# Patient Record
Sex: Female | Born: 1955 | Race: Black or African American | Hispanic: No | Smoking: Never smoker
Health system: Southern US, Community
[De-identification: ages and names within clinical notes are randomized; demographics above are authoritative.]

## PROBLEM LIST (undated history)

## (undated) DIAGNOSIS — I10 Essential (primary) hypertension: Secondary | ICD-10-CM

## (undated) DIAGNOSIS — M1990 Unspecified osteoarthritis, unspecified site: Secondary | ICD-10-CM

## (undated) DIAGNOSIS — K219 Gastro-esophageal reflux disease without esophagitis: Secondary | ICD-10-CM

## (undated) DIAGNOSIS — Z923 Personal history of irradiation: Secondary | ICD-10-CM

## (undated) DIAGNOSIS — D649 Anemia, unspecified: Secondary | ICD-10-CM

## (undated) DIAGNOSIS — C50912 Malignant neoplasm of unspecified site of left female breast: Secondary | ICD-10-CM

## (undated) DIAGNOSIS — E785 Hyperlipidemia, unspecified: Secondary | ICD-10-CM

## (undated) DIAGNOSIS — Z87442 Personal history of urinary calculi: Secondary | ICD-10-CM

## (undated) HISTORY — DX: Gastro-esophageal reflux disease without esophagitis: K21.9

## (undated) HISTORY — DX: Essential (primary) hypertension: I10

## (undated) HISTORY — DX: Hyperlipidemia, unspecified: E78.5

## (undated) HISTORY — DX: Unspecified osteoarthritis, unspecified site: M19.90

---

## 1985-05-14 HISTORY — PX: ABDOMINAL HYSTERECTOMY: SHX81

## 1999-05-02 ENCOUNTER — Ambulatory Visit (HOSPITAL_COMMUNITY): Admission: RE | Admit: 1999-05-02 | Discharge: 1999-05-02 | Payer: Self-pay | Admitting: Family Medicine

## 1999-05-02 ENCOUNTER — Encounter: Payer: Self-pay | Admitting: Family Medicine

## 1999-06-14 ENCOUNTER — Ambulatory Visit (HOSPITAL_COMMUNITY): Admission: RE | Admit: 1999-06-14 | Discharge: 1999-06-14 | Payer: Self-pay | Admitting: Family Medicine

## 1999-06-14 ENCOUNTER — Encounter: Payer: Self-pay | Admitting: Family Medicine

## 1999-07-19 ENCOUNTER — Encounter: Admission: RE | Admit: 1999-07-19 | Discharge: 1999-07-19 | Payer: Self-pay | Admitting: Family Medicine

## 1999-07-19 ENCOUNTER — Encounter: Payer: Self-pay | Admitting: Family Medicine

## 2001-09-22 ENCOUNTER — Other Ambulatory Visit: Admission: RE | Admit: 2001-09-22 | Discharge: 2001-09-22 | Payer: Self-pay | Admitting: Family Medicine

## 2002-10-20 ENCOUNTER — Other Ambulatory Visit: Admission: RE | Admit: 2002-10-20 | Discharge: 2002-10-20 | Payer: Self-pay | Admitting: Family Medicine

## 2002-10-22 ENCOUNTER — Encounter: Payer: Self-pay | Admitting: Family Medicine

## 2002-10-22 ENCOUNTER — Encounter: Admission: RE | Admit: 2002-10-22 | Discharge: 2002-10-22 | Payer: Self-pay | Admitting: Family Medicine

## 2005-05-28 ENCOUNTER — Other Ambulatory Visit (HOSPITAL_COMMUNITY): Admission: RE | Admit: 2005-05-28 | Discharge: 2005-08-26 | Payer: Self-pay | Admitting: Psychiatry

## 2005-05-29 ENCOUNTER — Ambulatory Visit: Payer: Self-pay | Admitting: Psychiatry

## 2010-06-08 ENCOUNTER — Emergency Department (HOSPITAL_COMMUNITY)
Admission: EM | Admit: 2010-06-08 | Discharge: 2010-06-09 | Payer: Self-pay | Source: Home / Self Care | Admitting: Emergency Medicine

## 2010-06-08 LAB — BASIC METABOLIC PANEL
CO2: 27 mEq/L (ref 19–32)
Calcium: 10.6 mg/dL — ABNORMAL HIGH (ref 8.4–10.5)
Chloride: 104 mEq/L (ref 96–112)
GFR calc Af Amer: 46 mL/min — ABNORMAL LOW (ref >60)
Potassium: 4.2 mEq/L (ref 3.5–5.1)
Sodium: 142 mEq/L (ref 135–145)

## 2010-06-08 LAB — URINALYSIS, ROUTINE W REFLEX MICROSCOPIC
Bilirubin Urine: NEGATIVE
Ketones, ur: NEGATIVE mg/dL
Leukocytes, UA: NEGATIVE
Nitrite: NEGATIVE
Protein, ur: NEGATIVE mg/dL
Specific Gravity, Urine: 1.012 (ref 1.005–1.030)
Urine Glucose, Fasting: NEGATIVE mg/dL
Urobilinogen, UA: 0.2 mg/dL (ref 0.0–1.0)
pH: 7.5 (ref 5.0–8.0)

## 2010-06-08 LAB — POCT I-STAT, CHEM 8
BUN: 16 mg/dL (ref 6–23)
Calcium, Ion: 1.24 mmol/L (ref 1.12–1.32)
Chloride: 107 mEq/L (ref 96–112)
Creatinine, Ser: 1.6 mg/dL — ABNORMAL HIGH (ref 0.4–1.2)
Glucose, Bld: 106 mg/dL — ABNORMAL HIGH (ref 70–99)
HCT: 42 % (ref 36.0–46.0)
Hemoglobin: 14.3 g/dL (ref 12.0–15.0)
Potassium: 4.1 mEq/L (ref 3.5–5.1)
Sodium: 140 mEq/L (ref 135–145)
TCO2: 28 mmol/L (ref 0–100)

## 2010-06-08 LAB — CBC
HCT: 40.5 % (ref 36.0–46.0)
Hemoglobin: 13.3 g/dL (ref 12.0–15.0)
RBC: 4.35 MIL/uL (ref 3.87–5.11)
WBC: 11 10*3/uL — ABNORMAL HIGH (ref 4.0–10.5)

## 2010-06-08 LAB — URINE MICROSCOPIC-ADD ON

## 2010-06-08 LAB — DIFFERENTIAL
Basophils Absolute: 0 10*3/uL (ref 0.0–0.1)
Basophils Relative: 0 % (ref 0–1)
Lymphocytes Relative: 9 % — ABNORMAL LOW (ref 12–46)
Neutro Abs: 9.6 10*3/uL — ABNORMAL HIGH (ref 1.7–7.7)
Neutrophils Relative %: 87 % — ABNORMAL HIGH (ref 43–77)

## 2013-01-21 ENCOUNTER — Encounter (HOSPITAL_COMMUNITY): Payer: Self-pay | Admitting: Family Medicine

## 2013-01-21 ENCOUNTER — Emergency Department (HOSPITAL_COMMUNITY)
Admission: EM | Admit: 2013-01-21 | Discharge: 2013-01-22 | Disposition: A | Discharge status: Home / Self Care | Payer: No Typology Code available for payment source | Attending: Emergency Medicine | Admitting: Emergency Medicine

## 2013-01-21 DIAGNOSIS — S6980XA Other specified injuries of unspecified wrist, hand and finger(s), initial encounter: Secondary | ICD-10-CM | POA: Insufficient documentation

## 2013-01-21 DIAGNOSIS — R071 Chest pain on breathing: Secondary | ICD-10-CM | POA: Insufficient documentation

## 2013-01-21 DIAGNOSIS — Y9389 Activity, other specified: Secondary | ICD-10-CM | POA: Insufficient documentation

## 2013-01-21 DIAGNOSIS — S6990XA Unspecified injury of unspecified wrist, hand and finger(s), initial encounter: Secondary | ICD-10-CM | POA: Insufficient documentation

## 2013-01-21 DIAGNOSIS — M79645 Pain in left finger(s): Secondary | ICD-10-CM

## 2013-01-21 DIAGNOSIS — M791 Myalgia, unspecified site: Secondary | ICD-10-CM

## 2013-01-21 DIAGNOSIS — Y92410 Unspecified street and highway as the place of occurrence of the external cause: Secondary | ICD-10-CM | POA: Insufficient documentation
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
## 2013-01-22 ENCOUNTER — Emergency Department (HOSPITAL_COMMUNITY): Payer: No Typology Code available for payment source

## 2013-01-22 MED ORDER — IBUPROFEN 800 MG PO TABS
800.0000 mg | ORAL_TABLET | Freq: Once | ORAL | Status: AC
  Administered 2013-01-22: 800 mg via ORAL
  Filled 2013-01-22: qty 1

## 2013-01-22 MED ORDER — NAPROXEN SODIUM 220 MG PO TABS: 220.0000 mg | ORAL_TABLET | Freq: Two times a day (BID) | ORAL | Status: DC
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
## 2013-01-22 NOTE — ED Provider Notes (Signed)
Medical screening examination/treatment/procedure(s) were performed by non-physician practitioner and as supervising physician I was immediately available for consultation/collaboration.  Anairis Knick M Floyd Wade, MD 01/22/13 2044 

## 2013-02-17 ENCOUNTER — Ambulatory Visit: Payer: No Typology Code available for payment source | Attending: Internal Medicine

## 2013-07-10 ENCOUNTER — Encounter (HOSPITAL_COMMUNITY): Payer: Self-pay | Admitting: Emergency Medicine

## 2013-07-10 ENCOUNTER — Emergency Department (HOSPITAL_COMMUNITY): Payer: No Typology Code available for payment source

## 2013-07-10 ENCOUNTER — Emergency Department (HOSPITAL_COMMUNITY)
Admission: EM | Admit: 2013-07-10 | Discharge: 2013-07-10 | Disposition: A | Discharge status: Home / Self Care | Payer: No Typology Code available for payment source | Attending: Emergency Medicine | Admitting: Emergency Medicine

## 2013-07-10 DIAGNOSIS — R000 Tachycardia, unspecified: Secondary | ICD-10-CM | POA: Insufficient documentation

## 2013-07-10 DIAGNOSIS — K625 Hemorrhage of anus and rectum: Secondary | ICD-10-CM

## 2013-07-10 DIAGNOSIS — I10 Essential (primary) hypertension: Secondary | ICD-10-CM

## 2013-07-10 DIAGNOSIS — Z79899 Other long term (current) drug therapy: Secondary | ICD-10-CM | POA: Insufficient documentation

## 2013-07-10 LAB — PRO B NATRIURETIC PEPTIDE: Pro B Natriuretic peptide (BNP): 1916 pg/mL — ABNORMAL HIGH (ref 0–125)

## 2013-07-10 LAB — CBC WITH DIFFERENTIAL/PLATELET
BASOS ABS: 0 10*3/uL (ref 0.0–0.1)
BASOS PCT: 0 % (ref 0–1)
EOS PCT: 2 % (ref 0–5)
Eosinophils Absolute: 0.1 10*3/uL (ref 0.0–0.7)
HCT: 41.1 % (ref 36.0–46.0)
Hemoglobin: 13.6 g/dL (ref 12.0–15.0)
LYMPHS PCT: 32 % (ref 12–46)
Lymphs Abs: 1.8 10*3/uL (ref 0.7–4.0)
MCH: 30.4 pg (ref 26.0–34.0)
MCHC: 33.1 g/dL (ref 30.0–36.0)
MCV: 91.9 fL (ref 78.0–100.0)
Monocytes Absolute: 0.3 10*3/uL (ref 0.1–1.0)
Monocytes Relative: 6 % (ref 3–12)
NEUTROS ABS: 3.6 10*3/uL (ref 1.7–7.7)
Neutrophils Relative %: 61 % (ref 43–77)
PLATELETS: 311 10*3/uL (ref 150–400)
RBC: 4.47 MIL/uL (ref 3.87–5.11)
RDW: 13.5 % (ref 11.5–15.5)
WBC: 5.8 10*3/uL (ref 4.0–10.5)

## 2013-07-10 LAB — COMPREHENSIVE METABOLIC PANEL
ALBUMIN: 3.7 g/dL (ref 3.5–5.2)
ALT: 8 U/L (ref 0–35)
AST: 15 U/L (ref 0–37)
Alkaline Phosphatase: 112 U/L (ref 39–117)
BUN: 12 mg/dL (ref 6–23)
CALCIUM: 10.4 mg/dL (ref 8.4–10.5)
CHLORIDE: 104 meq/L (ref 96–112)
CO2: 25 meq/L (ref 19–32)
CREATININE: 0.99 mg/dL (ref 0.50–1.10)
GFR calc Af Amer: 71 mL/min — ABNORMAL LOW (ref >90)
GFR, EST NON AFRICAN AMERICAN: 61 mL/min — AB (ref >90)
Glucose, Bld: 91 mg/dL (ref 70–99)
Potassium: 4.1 mEq/L (ref 3.7–5.3)
SODIUM: 143 meq/L (ref 137–147)
Total Bilirubin: 0.5 mg/dL (ref 0.3–1.2)
Total Protein: 8 g/dL (ref 6.0–8.3)

## 2013-07-10 LAB — TROPONIN I: Troponin I: <0.3 ng/mL (ref <0.30)

## 2013-07-10 LAB — POC OCCULT BLOOD, ED: Fecal Occult Bld: POSITIVE — AB

## 2013-07-10 LAB — PROTIME-INR
INR: 0.97 (ref 0.00–1.49)
Prothrombin Time: 12.7 seconds (ref 11.6–15.2)

## 2013-07-10 LAB — D-DIMER, QUANTITATIVE (NOT AT ARMC): D DIMER QUANT: 2.27 ug{FEU}/mL — AB (ref 0.00–0.48)

## 2013-07-10 LAB — HEMOGLOBIN AND HEMATOCRIT, BLOOD
HEMATOCRIT: 40 % (ref 36.0–46.0)
HEMOGLOBIN: 13 g/dL (ref 12.0–15.0)

## 2013-07-10 MED ORDER — SODIUM CHLORIDE 0.9 % IV BOLUS (SEPSIS)
1000.0000 mL | Freq: Once | INTRAVENOUS | Status: AC
  Administered 2013-07-10: 1000 mL via INTRAVENOUS

## 2013-07-10 MED ORDER — IOHEXOL 350 MG/ML SOLN
100.0000 mL | Freq: Once | INTRAVENOUS | Status: AC | PRN
  Administered 2013-07-10: 100 mL via INTRAVENOUS

## 2013-07-10 MED ORDER — IOHEXOL 300 MG/ML  SOLN
50.0000 mL | Freq: Once | INTRAMUSCULAR | Status: AC | PRN
  Administered 2013-07-10: 50 mL via ORAL

## 2013-07-10 MED ORDER — OMEPRAZOLE 20 MG PO CPDR: 20.0000 mg | DELAYED_RELEASE_CAPSULE | Freq: Every day | ORAL | Status: DC

## 2013-07-10 NOTE — Progress Notes (Signed)
[facility] Stacy, spoke with patient about [employer]. Patient [facility]. CL set patient up with a follow-up apt with pcp on 4/8 at 2:00 pm.

## 2013-07-10 NOTE — ED Notes (Signed)
Per pt, had blood in her stool this am-asymptomatic, states clots with first BM, slight blood with second BM

## 2013-07-10 NOTE — Discharge Instructions (Signed)
Bloody Stools Followup with primary Dr. and GI doctor regarding her rectal bleeding. You need treatment for your blood pressure. Return to ED if you develop worsening bleeding, chest pain, abdominal pain, dizziness lightheadedness or any concerns. Bloody stools often mean that there is a problem in the digestive tract. Your caregiver may use the term "melena" to describe black, tarry, and bad smelling stools or "hematochezia" to describe red or maroon-colored stools. Blood seen in the stool can be caused by bleeding anywhere along the intestinal tract.  A black stool usually means that blood is coming from the upper part of the gastrointestinal tract (esophagus, stomach, or small bowel). Passing maroon-colored stools or bright red blood usually means that blood is coming from lower down in the large bowel or the rectum. However, sometimes massive bleeding in the stomach or small intestine can cause bright red bloody stools.  Consuming black licorice, lead, iron pills, medicines containing bismuth subsalicylate, or blueberries can also cause black stools. Your caregiver can test black stools to see if blood is present. It is important that the cause of the bleeding be found. Treatment can then be started, and the problem can be corrected. Rectal bleeding may not be serious, but you should not assume everything is okay until you know the cause.It is very important to follow up with your caregiver or a specialist in gastrointestinal problems. CAUSES  Blood in the stools can come from various underlying causes.Often, the cause is not found during your first visit. Testing is often needed to discover the cause of bleeding in the gastrointestinal tract. Causes range from simple to serious or even life-threatening.Possible causes include:  Hemorrhoids.These are veins that are full of blood (engorged) in the rectum. They cause pain, inflammation, and may bleed.  Anal fissures.These are areas of painful  tearing which may bleed. They are often caused by passing hard stool.  Diverticulosis.These are pouches that form on the colon over time, with age, and may bleed significantly.  Diverticulitis.This is inflammation in areas with diverticulosis. It can cause pain, fever, and bloody stools, although bleeding is rare.  Proctitis and colitis. These are inflamed areas of the rectum or colon. They may cause pain, fever, and bloody stools.  Polyps and cancer. Colon cancer is a leading cause of preventable cancer death.It often starts out as precancerous polyps that can be removed during a colonoscopy, preventing progression into cancer. Sometimes, polyps and cancer may cause rectal bleeding.  Gastritis and ulcers.Bleeding from the upper gastrointestinal tract (near the stomach) may travel through the intestines and produce black, sometimes tarry, often bad smelling stools. In certain cases, if the bleeding is fast enough, the stools may not be black, but red and the condition may be life-threatening. SYMPTOMS  You may have stools that are bright red and bloody, that are normal color with blood on them, or that are dark black and tarry. In some cases, you may only have blood in the toilet bowl. Any of these cases need medical care. You may also have:  Pain at the anus or anywhere in the rectum.  Lightheadedness or feeling faint.  Extreme weakness.  Nausea or vomiting.  Fever. DIAGNOSIS Your caregiver may use the following methods to find the cause of your bleeding:  Taking a medical history. Age is important. Older people tend to develop polyps and cancer more often. If there is anal pain and a hard, large stool associated with bleeding, a tear of the anus may be the cause. If blood drips  into the toilet after a bowel movement, bleeding hemorrhoids may be the problem. The color and frequency of the bleeding are additional considerations. In most cases, the medical history provides clues, but  seldom the final answer.  A visual and finger (digital) exam. Your caregiver will inspect the anal area, looking for tears and hemorrhoids. A finger exam can provide information when there is tenderness or a growth inside. In men, the prostate is also examined.  Endoscopy. Several types of small, long scopes (endoscopes) are used to view the colon.  In the office, your caregiver may use a rigid, or more commonly, a flexible viewing sigmoidoscope. This exam is called flexible sigmoidoscopy. It is performed in 5 to 10 minutes.  A more thorough exam is accomplished with a colonoscope. It allows your caregiver to view the entire 5 to 6 foot long colon. Medicine to help you relax (sedative) is usually given for this exam. Frequently, a bleeding lesion may be present beyond the reach of the sigmoidoscope. So, a colonoscopy may be the best exam to start with. Both exams are usually done on an outpatient basis. This means the patient does not stay overnight in the hospital or surgery center.  An upper endoscopy may be needed to examine your stomach. Sedation is used and a flexible endoscope is put in your mouth, down to your stomach.  A barium enema X-ray. This is an X-ray exam. It uses liquid barium inserted by enema into the rectum. This test alone may not identify an actual bleeding point. X-rays highlight abnormal shadows, such as those made by lumps (tumors), diverticuli, or colitis. TREATMENT  Treatment depends on the cause of your bleeding.   For bleeding from the stomach or colon, the caregiver doing your endoscopy or colonoscopy may be able to stop the bleeding as part of the procedure.  Inflammation or infection of the colon can be treated with medicines.  Many rectal problems can be treated with creams, suppositories, or warm baths.  Surgery is sometimes needed.  Blood transfusions are sometimes needed if you have lost a lot of blood.  For any bleeding problem, let your caregiver know if  you take aspirin or other blood thinners regularly. HOME CARE INSTRUCTIONS   Take any medicines exactly as prescribed.  Keep your stools soft by eating a diet high in fiber. Prunes (1 to 3 a day) work well for many people.  Drink enough water and fluids to keep your urine clear or pale yellow.  Take sitz baths if advised. A sitz bath is when you sit in a bathtub with warm water for 10 to 15 minutes to soak, soothe, and cleanse the rectal area.  If enemas or suppositories are advised, be sure you know how to use them. Tell your caregiver if you have problems with this.  Monitor your bowel movements to look for signs of improvement or worsening. SEEK MEDICAL CARE IF:   You do not improve in the time expected.  Your condition worsens after initial improvement.  You develop any new symptoms. SEEK IMMEDIATE MEDICAL CARE IF:   You develop severe or prolonged rectal bleeding.  You vomit blood.  You feel weak or faint.  You have a fever. MAKE SURE YOU:  Understand these instructions.  Will watch your condition.  Will get help right away if you are not doing well or get worse. Document Released: 04/20/2002 Document Revised: 07/23/2011 Document Reviewed: 09/15/2010 [facility] Patient Information 2014 [place], [place].   Hypertension As your heart beats,  it forces blood through your arteries. This force is your blood pressure. If the pressure is too high, it is called hypertension (HTN) or high blood pressure. HTN is dangerous because you may have it and not know it. High blood pressure may mean that your heart has to work harder to pump blood. Your arteries may be narrow or stiff. The extra work puts you at risk for heart disease, stroke, and other problems.  Blood pressure consists of two numbers, a higher number over a lower, 110/72, for example. It is stated as "110 over 72." The ideal is below 120 for the top number (systolic) and under 80 for the bottom (diastolic). Write down your  blood pressure today. You should pay close attention to your blood pressure if you have certain conditions such as:  Heart failure.  Prior heart attack.  Diabetes  Chronic kidney disease.  Prior stroke.  Multiple risk factors for heart disease. To see if you have HTN, your blood pressure should be measured while you are seated with your arm held at the level of the heart. It should be measured at least twice. A one-time elevated blood pressure reading (especially in the Emergency Department) does not mean that you need treatment. There may be conditions in which the blood pressure is different between your right and left arms. It is important to see your caregiver soon for a recheck. Most people have essential hypertension which means that there is not a specific cause. This type of high blood pressure may be lowered by changing lifestyle factors such as:  Stress.  Smoking.  Lack of exercise.  Excessive weight.  Drug/tobacco/alcohol use.  Eating less salt. Most people do not have symptoms from high blood pressure until it has caused damage to the body. Effective treatment can often prevent, delay or reduce that damage. TREATMENT  When a cause has been identified, treatment for high blood pressure is directed at the cause. There are a large number of medications to treat HTN. These fall into several categories, and your caregiver will help you select the medicines that are best for you. Medications may have side effects. You should review side effects with your caregiver. If your blood pressure stays high after you have made lifestyle changes or started on medicines,   Your medication(s) may need to be changed.  Other problems may need to be addressed.  Be certain you understand your prescriptions, and know how and when to take your medicine.  Be sure to follow up with your caregiver within the time frame advised (usually within two weeks) to have your blood pressure rechecked  and to review your medications.  If you are taking more than one medicine to lower your blood pressure, make sure you know how and at what times they should be taken. Taking two medicines at the same time can result in blood pressure that is too low. SEEK IMMEDIATE MEDICAL CARE IF:  You develop a severe headache, blurred or changing vision, or confusion.  You have unusual weakness or numbness, or a faint feeling.  You have severe chest or abdominal pain, vomiting, or breathing problems. MAKE SURE YOU:   Understand these instructions.  Will watch your condition.  Will get help right away if you are not doing well or get worse. Document Released: 04/30/2005 Document Revised: 07/23/2011 Document Reviewed: 12/19/2007 [facility] Patient Information 2014 [place].

## 2013-07-10 NOTE — ED Notes (Signed)
Dr. Manus Gunningancour approved that patient was allowed to eat so pt. was given a sandwich and ginger ale.

## 2013-07-10 NOTE — ED Notes (Signed)
Patient transported to CT 

## 2013-07-10 NOTE — ED Provider Notes (Signed)
CSN: [id number]     Arrival date & time 07/10/13  4098 History   First MD Initiated Contact with Patient 07/10/13 0815     Chief Complaint  Patient presents with  . Rectal Bleeding     (Consider location/radiation/quality/duration/timing/severity/associated sxs/prior Treatment) HPI Comments: Patient presents with 2 episodes of rectal bleeding this morning. She states she had a normal appearing bowel movement and noticed it was red color on the outside of brown stool. She had red with wiping. She denies straining on the toilet. She believes she saw some clots in the toilet water but the water did not turn red. She had no pain with defecation. She had no abdominal pain. No vomiting. She has second episode of brown stool with red on the outside I was less intense. She denies any previous history of rectal bleeding. She denies any hemorrhoid history. She admits to occasional naproxen and caffeine use.  She denies any excessive alcohol use and she does not smoke. She's never had a colonoscopy. Denies any dizziness or lightheadedness. She denies any chest pain or SOB.   The history is provided by the patient.    History reviewed. No pertinent past medical history. Past Surgical History  Procedure Laterality Date  . Abdominal hysterectomy     No family history on file. History  Substance Use Topics  . Smoking status: Never Smoker   . Smokeless tobacco: Not on file  . Alcohol Use: Not on file   OB History   Grav Para Term Preterm Abortions TAB SAB Ect Mult Living                 Review of Systems  Constitutional: Negative for fever, activity change and appetite change.  Respiratory: Negative for cough, chest tightness and shortness of breath.   Cardiovascular: Negative for chest pain and palpitations.  Gastrointestinal: Positive for blood in stool and hematochezia. Negative for nausea, vomiting, abdominal pain and rectal pain.  Genitourinary: Negative for dysuria, hematuria, vaginal  bleeding and vaginal discharge.  Musculoskeletal: Negative for back pain.  Skin: Negative for rash.  Neurological: Negative for dizziness, weakness, light-headedness and headaches.  A complete 10 system review of systems was obtained and all systems are negative except as noted in the HPI and PMH.      Allergies  Review of patient's allergies indicates no known allergies.  Home Medications   Current Outpatient Rx  Name  Route  Sig  Dispense  Refill  . Cholecalciferol (VITAMIN D-3 PO)   Oral   Take 1 capsule by mouth daily.         Marland Kitchen co-enzyme Q-10 30 MG capsule   Oral   Take 30 mg by mouth daily.         . Omega-3 Fatty Acids (FISH OIL CONCENTRATE PO)   Oral   Take 1 capsule by mouth daily.         Marland Kitchen omeprazole (PRILOSEC) 20 MG capsule   Oral   Take 1 capsule (20 mg total) by mouth daily.   30 capsule   0    BP 167/115  Pulse 95  Temp(Src) 98.2 F (36.8 C) (Oral)  Resp 18  SpO2 95% Physical Exam  Constitutional: She is oriented to person, place, and time. She appears well-developed and well-nourished. No distress.  HENT:  Head: Normocephalic and atraumatic.  Mouth/Throat: Oropharynx is clear and moist. No oropharyngeal exudate.  Eyes: Conjunctivae and EOM are normal. Pupils are equal, round, and reactive to light.  Neck:  Normal range of motion. Neck supple.  Cardiovascular: Normal rate and normal heart sounds.   tachycardic  Pulmonary/Chest: Effort normal and breath sounds normal. No respiratory distress.  Abdominal: Soft. There is no tenderness. There is no rebound and no guarding.  Genitourinary: Guaiac positive stool.  No visible hemorrhoids, no fissures. Brown stool with specks of red on exam Chaperone present  Musculoskeletal: Normal range of motion. She exhibits no edema and no tenderness.  Neurological: She is alert and oriented to person, place, and time. No cranial nerve deficit. She exhibits normal muscle tone. Coordination normal.  Skin: Skin  is warm.    ED Course  Procedures (including critical care time) Labs Review Labs Reviewed  COMPREHENSIVE METABOLIC PANEL - Abnormal; Notable for the following:    GFR calc non Af Amer 61 (*)    GFR calc Af Amer 71 (*)    All other components within normal limits  D-DIMER, QUANTITATIVE - Abnormal; Notable for the following:    D-Dimer, Quant 2.27 (*)    All other components within normal limits  POC OCCULT BLOOD, ED - Abnormal; Notable for the following:    Fecal Occult Bld POSITIVE (*)    All other components within normal limits  CBC WITH DIFFERENTIAL  PROTIME-INR  TROPONIN I  HEMOGLOBIN AND HEMATOCRIT, BLOOD  TROPONIN I   Imaging Review Ct Angio Chest Pe W/cm &/or Wo Cm  07/10/2013   CLINICAL DATA:  58 year old female with tachycardia and abnormal D-dimer. Bleeding. Initial encounter.  EXAM: CT ANGIOGRAPHY CHEST WITH CONTRAST  TECHNIQUE: Multidetector CT imaging of the chest was performed using the standard protocol during bolus administration of intravenous contrast. Multiplanar CT image reconstructions and MIPs were obtained to evaluate the vascular anatomy.  CONTRAST:  100mL OMNIPAQUE IOHEXOL 350 MG/ML SOLN  COMPARISON:  Chest radiographs 01/22/2013. CT Abdomen and Pelvis from today reported separately.  FINDINGS: Good contrast bolus timing in the pulmonary arterial tree.  No focal filling defect identified in the pulmonary arterial tree to suggest the presence of acute pulmonary embolism.  Major airways are patent. Trace layering right pleural effusion. No pericardial effusion. Pulmonary septal thickening. Dependent ground-glass opacity. No consolidation. Peribronchial thickening. Small nonspecific focus of sub solid opacity in the right middle lobe measuring 11 mm.  Aortic and coronary artery calcified atherosclerosis. No contrast within the aorta at the time of this exam. No mediastinal lymphadenopathy. Negative thoracic inlet. No axillary lymphadenopathy.  Abdomen findings reported  separately.  Scoliosis. No acute osseous abnormality identified.  Review of the MIP images confirms the above findings.  IMPRESSION: 1.  No evidence of acute pulmonary embolus. 2. Trace right pleural effusion and pulmonary septal thickening with mostly dependent ground-glass opacity. Favor interstitial edema rather than infectious/inflammatory process. 3. Abdomen and pelvis CT from today reported separately.   Electronically Signed   By: Augusto GambleLee  Hall M.D.   On: 07/10/2013 12:50   Ct Abdomen Pelvis W Contrast  07/10/2013   CLINICAL DATA:  Rectal bleeding  EXAM: CT ABDOMEN AND PELVIS WITH CONTRAST  TECHNIQUE: Multidetector CT imaging of the abdomen and pelvis was performed using the standard protocol following bolus administration of intravenous contrast.  CONTRAST:  100mL OMNIPAQUE IOHEXOL 350 MG/ML SOLN  COMPARISON:  06/09/2010  FINDINGS: Small right pleural effusion is identified. Diffuse interstitial prominence is identified in both lung bases compatible with interstitial edema.  There is no focal liver abnormality identified. The gallbladder appears normal. No biliary dilatation. Normal appearance of the pancreas. The spleen is unremarkable.  The  adrenal glands are both normal. Bilateral renal cysts are noted.  The urinary bladder appears normal.  Previous hysterectomy.  There is calcified atherosclerotic disease involving the abdominal aorta. No aneurysm. No upper abdominal adenopathy. No inguinal adenopathy identified. No pelvic or inguinal adenopathy identified.  The stomach appears within normal limits. The small bowel loops have a normal course and caliber without obstruction. The appendix is visualized and appears normal. Multiple distal colonic diverticula identified without acute inflammation.  Review of the visualized bony structures There is a a scoliosis deformity with multi level degenerative disc disease.  IMPRESSION: 1. No acute findings identified within the abdomen or pelvis. 2. Atherosclerotic  disease. 3. Scoliosis and degenerative disc disease 4. Colonic diverticulosis   Electronically Signed   By: Signa Kell M.D.   On: 07/10/2013 12:50    @NOMUSE @  MDM   Final diagnoses:  Rectal bleeding  Hypertension   2 episodes of bright red blood per rectum with stool. No abdominal pain or rectal pain. No dizziness or lightheadedness. She appears stable but is tachycardic. No chest pain or shortness of breath.  Patient is hypertensive, she has a history of hypertension but is not take medications. She does not have a doctor. She is mildly tachycardic in the 100s. Hemoglobin is stable. Suspect hemorrhoidal versus diverticular bleed. Patient is in no distress. Her CT scan in 2012 showed diverticulosis.  Heart rate Has improved to 95 after IV fluids. Patient in no distress. She denies chest pain, abdominal pain, back pain.  No rectal bleeding in ED. Hemoglobin stable on repeat check.  Admission offered to patient and discussed with Dr. Vanessa Barbara.  She is asymptomatic at this time.  No evidence of PE. Hemoglobin is stable and heart rate is improved to 95 after IV fluids. Dr. Vanessa Barbara agrees the patient is stable for outpatient followup. She is referred to the [facility] and to GI. She is informed about her elevated blood pressure and need for treatment. She is instructed to return with worsening bleeding, pain, chest pain or shortness of breath or any other concerns.   Date: 07/10/2013  Rate: 103  Rhythm: sinus tachycardia  QRS Axis: normal  Intervals: normal  ST/T Wave abnormalities: nonspecific ST/T changes  Conduction Disutrbances:none  Narrative Interpretation: Lateral and inferior T wave inversions consistent with LVH  Old EKG Reviewed: none available  BP 167/115  Pulse 95  Temp(Src) 98.2 F (36.8 C) (Oral)  Resp 18  SpO2 95%    Glynn Octave, MD 07/10/13 1656

## 2013-07-13 ENCOUNTER — Encounter (HOSPITAL_COMMUNITY): Payer: Self-pay | Admitting: Emergency Medicine

## 2013-07-13 ENCOUNTER — Emergency Department (HOSPITAL_COMMUNITY)
Admission: EM | Admit: 2013-07-13 | Discharge: 2013-07-13 | Disposition: A | Discharge status: Home / Self Care | Payer: No Typology Code available for payment source | Source: Home / Self Care | Attending: Family Medicine | Admitting: Family Medicine

## 2013-07-13 DIAGNOSIS — I10 Essential (primary) hypertension: Secondary | ICD-10-CM

## 2013-07-13 MED ORDER — LISINOPRIL-HYDROCHLOROTHIAZIDE 20-12.5 MG PO TABS: 1.0000 | ORAL_TABLET | Freq: Every day | ORAL | Status: DC

## 2013-07-13 NOTE — ED Notes (Signed)
[facility], and [facility] due to her heart rate and elevated BP, but they cannot see her until a month from now

## 2013-07-13 NOTE — ED Provider Notes (Signed)
CSN: [id number]     Arrival date & time 07/13/13  1911 History   First MD Initiated Contact with Patient 07/13/13 2039     Chief Complaint  Patient presents with  . Follow-up   (Consider location/radiation/quality/duration/timing/severity/associated sxs/prior Treatment) Patient is a 58 y.o. female presenting with hypertension.  Hypertension This is a chronic problem. Episode onset: no meds for long time. The problem has not changed since onset.Pertinent negatives include no chest pain, no headaches and no shortness of breath.    History reviewed. No pertinent past medical history. Past Surgical History  Procedure Laterality Date  . Abdominal hysterectomy     History reviewed. No pertinent family history. History  Substance Use Topics  . Smoking status: Never Smoker   . Smokeless tobacco: Not on file  . Alcohol Use: Not on file   OB History   Grav Para Term Preterm Abortions TAB SAB Ect Mult Living                 Review of Systems  Constitutional: Negative.   Respiratory: Negative.  Negative for shortness of breath.   Cardiovascular: Negative.  Negative for chest pain, palpitations and leg swelling.  Neurological: Negative for headaches.    Allergies  Review of patient's allergies indicates no known allergies.  Home Medications   Current Outpatient Rx  Name  Route  Sig  Dispense  Refill  . Cholecalciferol (VITAMIN D-3 PO)   Oral   Take 1 capsule by mouth daily.         Marland Kitchen. co-enzyme Q-10 30 MG capsule   Oral   Take 30 mg by mouth daily.         Marland Kitchen. lisinopril-hydrochlorothiazide (PRINZIDE,ZESTORETIC) 20-12.5 MG per tablet   Oral   Take 1 tablet by mouth daily.   30 tablet   1   . Omega-3 Fatty Acids (FISH OIL CONCENTRATE PO)   Oral   Take 1 capsule by mouth daily.         Marland Kitchen. omeprazole (PRILOSEC) 20 MG capsule   Oral   Take 1 capsule (20 mg total) by mouth daily.   30 capsule   0    BP 175/104  Pulse 106  Temp(Src) 98.8 F (37.1 C) (Oral)  Resp  16  SpO2 100% Physical Exam  Nursing note and vitals reviewed. Constitutional: She is oriented to person, place, and time. She appears well-developed and well-nourished.  Neck: Normal range of motion. Neck supple.  Cardiovascular: Normal rate, regular rhythm, normal heart sounds and intact distal pulses.   Pulmonary/Chest: Effort normal and breath sounds normal.  Lymphadenopathy:    She has no cervical adenopathy.  Neurological: She is alert and oriented to person, place, and time.  Skin: Skin is warm and dry.    ED Course  Procedures (including critical care time) Labs Review Labs Reviewed - No data to display Imaging Review No results found.   MDM   1. Hypertension goal BP (blood pressure) < 140/90        Linna HoffJames D Kindl, MD 07/13/13 2110

## 2013-07-17 ENCOUNTER — Ambulatory Visit: Payer: No Typology Code available for payment source | Attending: Internal Medicine | Admitting: Internal Medicine

## 2013-07-17 ENCOUNTER — Encounter: Payer: Self-pay | Admitting: Internal Medicine

## 2013-07-17 VITALS — BP 178/93 | HR 90 | Temp 98.2°F | Resp 18 | Ht 64.0 in | Wt 197.0 lb

## 2013-07-17 DIAGNOSIS — K921 Melena: Secondary | ICD-10-CM

## 2013-07-17 DIAGNOSIS — I10 Essential (primary) hypertension: Secondary | ICD-10-CM

## 2013-07-17 DIAGNOSIS — Z0000 Encounter for general adult medical examination without abnormal findings: Secondary | ICD-10-CM

## 2013-07-17 LAB — CBC
HCT: 40.4 % (ref 36.0–46.0)
Hemoglobin: 13.7 g/dL (ref 12.0–15.0)
MCH: 30.4 pg (ref 26.0–34.0)
MCHC: 33.9 g/dL (ref 30.0–36.0)
MCV: 89.8 fL (ref 78.0–100.0)
PLATELETS: 383 10*3/uL (ref 150–400)
RBC: 4.5 MIL/uL (ref 3.87–5.11)
RDW: 14.6 % (ref 11.5–15.5)
WBC: 6.1 10*3/uL (ref 4.0–10.5)

## 2013-07-17 MED ORDER — AMLODIPINE BESYLATE 10 MG PO TABS: 10.0000 mg | ORAL_TABLET | Freq: Every day | ORAL | Status: DC

## 2013-07-17 NOTE — Progress Notes (Unsigned)
Patient ID: Kayla Lowe, female   DOB: 09/02/1955, 58 y.o.   MRN: [id number]   HPI: Kayla Lowe is a 58 y.o. female presenting on 07/17/2013 with HTN and bloody stools. Pt was seen in the ER and had a CT which was normal. Hgb was 13. She is no longer having bleeding. Some of it was clots and at one point it was streaking on the stool and present when wiping.     Past Medical History  Diagnosis Date  . Hypertension     Past Surgical History  Procedure Laterality Date  . Abdominal hysterectomy      Current Outpatient Prescriptions  Medication Sig Dispense Refill  . Cholecalciferol (VITAMIN D-3 PO) Take 1 capsule by mouth daily.      Marland Kitchen co-enzyme Q-10 30 MG capsule Take 30 mg by mouth daily.      Marland Kitchen lisinopril-hydrochlorothiazide (PRINZIDE,ZESTORETIC) 20-12.5 MG per tablet Take 1 tablet by mouth daily.  30 tablet  1  . Omega-3 Fatty Acids (FISH OIL CONCENTRATE PO) Take 1 capsule by mouth daily.       No current facility-administered medications for this visit.    No Known Allergies  family history- father - lung cancer- brother with HTN  History   Social History  . Marital Status: Single    Spouse Name: N/A    Number of Children: N/A  . Years of Education: N/A   Occupational History  . Not on file.   Social History Main Topics  . Smoking status: Never Smoker   . Smokeless tobacco: Not on file  . Alcohol Use: Not on file  . Drug Use: No  . Sexual Activity: Not Currently   Other Topics Concern  . Not on file   Social History Narrative  . No narrative on file    Review of Systems  Review of Systems  Constitutional: Negative for fever, chills, diaphoresis, activity change, appetite change and fatigue.  HENT: Negative for ear pain, nosebleeds, congestion, facial swelling, rhinorrhea, neck pain, neck stiffness and ear discharge.  Eyes: Negative for pain, discharge, redness, itching and visual disturbance.  Respiratory: Negative for  cough, choking, chest tightness, shortness of breath, wheezing and stridor.  Cardiovascular: Negative for chest pain, palpitations and leg swelling.  Gastrointestinal: Negative for abdominal distention, vomiting, diarrhea or consitpation- Per HPI Genitourinary: Negative for dysuria, urgency, frequency, hematuria, flank pain, decreased urine volume, difficulty urinating and dyspareunia.  Musculoskeletal: Negative for back pain, joint swelling, arthralgias or gait problem.  Neurological: Negative for dizziness, tremors, seizures, syncope, facial asymmetry, speech difficulty, weakness, light-headedness, numbness and headaches.  Hematological: Negative for adenopathy. Does not bruise/bleed easily.  Psychiatric/Behavioral: Negative for hallucinations, behavioral problems, confusion, dysphoric mood   Objective:  BP 178/93  Pulse 90  Temp(Src) 98.2 F (36.8 C) (Oral)  Resp 18  Ht 5\' 4"  (1.626 m)  Wt 197 lb (89.359 kg)  BMI 33.80 kg/m2  SpO2 98% Filed Weights   07/17/13 1706  Weight: 197 lb (89.359 kg)     Physical Exam  Constitutional: Appears well-developed and well-nourished. No distress. HENT: Normocephalic. External right and left ear normal. Oropharynx is clear and moist.  Eyes: Conjunctivae and EOM are normal. PERRLA, no scleral icterus.  Neck: Normal ROM. Neck supple. No JVD. No tracheal deviation. No thyromegaly.  CVS: RRR, S1/S2 +, no murmurs, no gallops, no carotid bruit.  Pulmonary: Effort and breath sounds normal, no stridor, rhonchi, wheezes, rales.  Abdominal: Soft. BS +,  no distension, tenderness, rebound or guarding.  Musculoskeletal: Normal range of motion. No edema and no tenderness.  Neuro: Alert. Normal reflexes, muscle tone coordination. No cranial nerve deficit. Skin: Skin is warm and dry. No rash noted. Not diaphoretic. No erythema. No pallor.  Psychiatric: Normal mood and affect. Behavior, judgment, thought content normal.   Lab Results  Component Value Date    WBC 5.8 07/10/2013   HGB 13.0 07/10/2013   HCT 40.0 07/10/2013   MCV 91.9 07/10/2013   PLT 311 07/10/2013   Lab Results  Component Value Date   CREATININE 0.99 07/10/2013   BUN 12 07/10/2013   NA 143 07/10/2013   K 4.1 07/10/2013   CL 104 07/10/2013   CO2 25 07/10/2013    No results found for this basename: HGBA1C   Lipid Panel  No results found for this basename: chol,  trig,  hdl,  cholhdl,  vldl,  ldlcalc        There are no active problems to display for this patient.    Preventative Medicine:  No health maintenance topics applied.  No health maintenance topics applied. Mammogram/Pap Smear : referral to GYN Colonoscopy : referral to GI  LAB WORK: ordered Metabolic panel: CBC:  Vitamin D : TSH: PSA:    Assessment and plan: HTN (hypertension) -  - add Amlodipine  Bloody stool - Plan: Ambulatory referral to Gastroenterology,   Routine history and physical examination of adult - Plan: Ambulatory referral to Gastroenterology, Ambulatory referral to Obstetrics / Gynecology, CBC, COMPLETE METABOLIC PANEL WITH GFR, TSH, Vit D  25 hydroxy   Return in about 2 months (around 09/16/2013).   The patient was given clear instructions to go to ER or return to medical center if symptoms don't improve, worsen or new problems develop. The patient verbalized understanding. The patient was told to call to get lab results if they haven't heard anything in the next week.     Calvert CantorSaima Jack Bolio, MD

## 2013-07-17 NOTE — Progress Notes (Unsigned)
Pt here to establish care for hypertension last seen in urgent care Monday and prescribed Lisinopril-HCTZ 20-12.5 mg Denies h/a or cp at this time BP- 178/93

## 2013-07-18 LAB — TSH: TSH: 2.471 u[IU]/mL (ref 0.350–4.500)

## 2013-07-18 LAB — COMPLETE METABOLIC PANEL WITH GFR
ALK PHOS: 93 U/L (ref 39–117)
AST: 15 U/L (ref 0–37)
Albumin: 4.3 g/dL (ref 3.5–5.2)
BUN: 12 mg/dL (ref 6–23)
CO2: 27 mEq/L (ref 19–32)
CREATININE: 0.94 mg/dL (ref 0.50–1.10)
Calcium: 10.3 mg/dL (ref 8.4–10.5)
Chloride: 103 mEq/L (ref 96–112)
GFR, Est African American: 77 mL/min
GFR, Est Non African American: 67 mL/min
Glucose, Bld: 93 mg/dL (ref 70–99)
Potassium: 4.2 mEq/L (ref 3.5–5.3)
Sodium: 140 mEq/L (ref 135–145)
Total Bilirubin: 0.4 mg/dL (ref 0.2–1.2)
Total Protein: 7.5 g/dL (ref 6.0–8.3)

## 2013-07-18 LAB — VITAMIN D 25 HYDROXY (VIT D DEFICIENCY, FRACTURES): Vit D, 25-Hydroxy: 24 ng/mL — ABNORMAL LOW (ref 30–89)

## 2013-07-18 MED ORDER — VITAMIN D (ERGOCALCIFEROL) 1.25 MG (50000 UNIT) PO CAPS: 50000.0000 [IU] | ORAL_CAPSULE | ORAL | Status: DC

## 2013-07-18 NOTE — Progress Notes (Signed)
Quick Note:  Please let patient know that they are deficient in Vit D and prescribe 50,000 U Vit D weekly for 4 wks x 4 refills.  If they are unable to afford it, they can take Vit D2 OTC 2000 U daily. ______ 

## 2013-07-24 ENCOUNTER — Ambulatory Visit: Payer: No Typology Code available for payment source | Attending: Internal Medicine

## 2013-07-24 DIAGNOSIS — K921 Melena: Secondary | ICD-10-CM

## 2013-07-24 DIAGNOSIS — Z0000 Encounter for general adult medical examination without abnormal findings: Secondary | ICD-10-CM

## 2013-07-24 DIAGNOSIS — I10 Essential (primary) hypertension: Secondary | ICD-10-CM

## 2013-07-24 LAB — LIPID PANEL
CHOL/HDL RATIO: 5.3 ratio
Cholesterol: 303 mg/dL — ABNORMAL HIGH (ref 0–200)
HDL: 57 mg/dL (ref >39)
LDL Cholesterol: 221 mg/dL — ABNORMAL HIGH (ref 0–99)
Triglycerides: 126 mg/dL (ref <150)
VLDL: 25 mg/dL (ref 0–40)

## 2013-08-19 ENCOUNTER — Ambulatory Visit: Payer: No Typology Code available for payment source | Admitting: Internal Medicine

## 2013-08-24 ENCOUNTER — Encounter: Payer: Self-pay | Admitting: Internal Medicine

## 2013-08-30 ENCOUNTER — Emergency Department (HOSPITAL_COMMUNITY): Payer: No Typology Code available for payment source

## 2013-08-30 ENCOUNTER — Encounter (HOSPITAL_COMMUNITY): Payer: Self-pay | Admitting: Emergency Medicine

## 2013-08-30 ENCOUNTER — Emergency Department (HOSPITAL_COMMUNITY)
Admission: EM | Admit: 2013-08-30 | Discharge: 2013-08-30 | Disposition: A | Discharge status: Home / Self Care | Payer: No Typology Code available for payment source | Attending: Emergency Medicine | Admitting: Emergency Medicine

## 2013-08-30 DIAGNOSIS — Z79899 Other long term (current) drug therapy: Secondary | ICD-10-CM | POA: Insufficient documentation

## 2013-08-30 DIAGNOSIS — B9789 Other viral agents as the cause of diseases classified elsewhere: Secondary | ICD-10-CM | POA: Insufficient documentation

## 2013-08-30 DIAGNOSIS — R61 Generalized hyperhidrosis: Secondary | ICD-10-CM | POA: Insufficient documentation

## 2013-08-30 DIAGNOSIS — R05 Cough: Secondary | ICD-10-CM

## 2013-08-30 DIAGNOSIS — B349 Viral infection, unspecified: Secondary | ICD-10-CM

## 2013-08-30 DIAGNOSIS — I10 Essential (primary) hypertension: Secondary | ICD-10-CM | POA: Insufficient documentation

## 2013-08-30 DIAGNOSIS — R079 Chest pain, unspecified: Secondary | ICD-10-CM | POA: Insufficient documentation

## 2013-08-30 DIAGNOSIS — Z8701 Personal history of pneumonia (recurrent): Secondary | ICD-10-CM | POA: Insufficient documentation

## 2013-08-30 DIAGNOSIS — R059 Cough, unspecified: Secondary | ICD-10-CM

## 2013-08-30 MED ORDER — HYDROCOD POLST-CHLORPHEN POLST 10-8 MG/5ML PO LQCR: 5.0000 mL | Freq: Two times a day (BID) | ORAL | Status: DC | PRN

## 2013-08-30 NOTE — ED Notes (Signed)
Pt states she's had a productive cough/congestion x 2 weeks, states has been coughing up green mucus x 1 week, pt states from coughing she is having chest discomfort and has a hx of pna. Pt states has taken robitussin and Claritin-D at home w/ no relief.

## 2013-08-30 NOTE — ED Notes (Signed)
Pt c/o productive cough since yesterday

## 2013-08-30 NOTE — ED Notes (Signed)
Patient transported to X-ray 

## 2013-08-30 NOTE — Discharge Instructions (Signed)

## 2013-08-30 NOTE — ED Provider Notes (Signed)
CSN: [id number]     Arrival date & time 08/30/13  1305 History   This chart was scribed for non-physician practitioner Teressa LowerVrinda Khalfani Weideman, FNP,working with Junius ArgyleForrest S Harrison, MD, by Yevette EdwardsAngela Bracken, ED Scribe. This patient was seen in room WTR5/WTR5 and the patient's care was started at 1:15 PM.    Chief Complaint  Patient presents with  . Cough    The history is provided by the patient. No language interpreter was used.   HPI Comments: Kayla Lowe is a 58 y.o. female, with a h/o HTN, who presents to the Emergency Department complaining of cough productive of green phlegm which began yesterday. She has experienced chest tightness and episodes of diaphoresis. She has a h/o sick contacts and a h/o pneumonia. She denies a h/o asthma. The pt is a non-smoker.   Past Medical History  Diagnosis Date  . Hypertension    Past Surgical History  Procedure Laterality Date  . Abdominal hysterectomy     No family history on file. History  Substance Use Topics  . Smoking status: Never Smoker   . Smokeless tobacco: Not on file  . Alcohol Use: Not on file   No OB history provided.  Review of Systems  Constitutional: Positive for diaphoresis.  Respiratory: Positive for cough and chest tightness.   All other systems reviewed and are negative.  Allergies  Review of patient's allergies indicates no known allergies.  Home Medications   Prior to Admission medications   Medication Sig Start Date End Date Taking? Authorizing Provider  amLODipine (NORVASC) 10 MG tablet Take 1 tablet (10 mg total) by mouth daily. 07/17/13   Calvert CantorSaima Rizwan, MD  Cholecalciferol (VITAMIN D-3 PO) Take 1 capsule by mouth daily.    Historical Provider, MD  co-enzyme Q-10 30 MG capsule Take 30 mg by mouth daily.    Historical Provider, MD  lisinopril-hydrochlorothiazide (PRINZIDE,ZESTORETIC) 20-12.5 MG per tablet Take 1 tablet by mouth daily. 07/13/13   Linna HoffJames D Kindl, MD  Omega-3 Fatty Acids (FISH OIL CONCENTRATE  PO) Take 1 capsule by mouth daily.    Historical Provider, MD  omeprazole (PRILOSEC) 20 MG capsule Take 1 capsule (20 mg total) by mouth daily. 07/10/13   Glynn OctaveStephen Rancour, MD  Vitamin D, Ergocalciferol, (DRISDOL) 50000 UNITS CAPS capsule Take 1 capsule (50,000 Units total) by mouth every 7 (seven) days. 07/18/13   Calvert CantorSaima Rizwan, MD   Triage Vitals: BP 142/76  Pulse 104  Temp(Src) 98 F (36.7 C) (Oral)  Resp 20  Ht 5' 4.5" (1.638 m)  Wt 197 lb (89.359 kg)  BMI 33.31 kg/m2  SpO2 99%  Physical Exam  Nursing note and vitals reviewed. Constitutional: She is oriented to person, place, and time. She appears well-developed and well-nourished. No distress.  HENT:  Head: Normocephalic and atraumatic.  Right Ear: External ear normal.  Left Ear: External ear normal.  Erythematous oropharynx. Swollen nares.  Productive cough.   Eyes: EOM are normal.  Neck: Neck supple. No tracheal deviation present.  Cardiovascular: Normal rate.   Pulmonary/Chest: Effort normal. No respiratory distress.  Musculoskeletal: Normal range of motion.  Neurological: She is alert and oriented to person, place, and time.  Skin: Skin is warm and dry.  Psychiatric: She has a normal mood and affect. Her behavior is normal.    ED Course  Procedures (including critical care time)  DIAGNOSTIC STUDIES: Oxygen Saturation is 99% on room air, normal by my interpretation.    COORDINATION OF CARE:  1:18 PM- Discussed treatment plan with patient,  and the patient agreed to the plan. The plan includes a chest x-ray.   Labs Review Labs Reviewed - No data to display  Imaging Review Dg Chest 2 View  08/30/2013   CLINICAL DATA:  Shortness of breath with mid chest pain.  EXAM: CHEST  2 VIEW  COMPARISON:  None.  FINDINGS: Normal cardiac and mediastinal silhouette. Clear lung fields. No effusion or pneumothorax. No acute bony abnormality. Mild scoliosis is stable.  IMPRESSION: No active cardiopulmonary disease.   Electronically  Signed   By: Davonna BellingJohn  Curnes M.D.   On: 08/30/2013 14:20     EKG Interpretation None      MDM   Final diagnoses:  Cough  Viral illness   No infection noted. Will treat symptomatically  I personally performed the services described in this documentation, which was scribed in my presence. The recorded information has been reviewed and is accurate.      Teressa LowerVrinda Angellica Maddison, NP 08/30/13 1455

## 2013-08-30 NOTE — ED Provider Notes (Signed)
Medical screening examination/treatment/procedure(s) were performed by non-physician practitioner and as supervising physician I was immediately available for consultation/collaboration.   EKG Interpretation None        Junius ArgyleForrest S Jacorian Golaszewski, MD 08/30/13 1510

## 2013-09-08 ENCOUNTER — Telehealth: Payer: Self-pay | Admitting: *Deleted

## 2013-09-08 NOTE — Telephone Encounter (Signed)
Received voicemail from [employer]. Patient reporting she needs a refill on her Lisinopril until her next appointment. Spoke with patient and informed her that her appointment is 30Apr2015 at 4:45PM. Patient reports she has enough Lisinopril to last her until her appointment on 30Apr2015. Reather LaurenceJamie R Paxtyn Boyar, RN

## 2013-09-10 ENCOUNTER — Ambulatory Visit: Payer: No Typology Code available for payment source | Attending: Family Medicine | Admitting: Family Medicine

## 2013-09-10 ENCOUNTER — Encounter: Payer: Self-pay | Admitting: Family Medicine

## 2013-09-10 VITALS — BP 128/86 | HR 72 | Temp 98.3°F | Resp 17 | Wt 202.4 lb

## 2013-09-10 DIAGNOSIS — I10 Essential (primary) hypertension: Secondary | ICD-10-CM

## 2013-09-10 DIAGNOSIS — E785 Hyperlipidemia, unspecified: Secondary | ICD-10-CM

## 2013-09-10 MED ORDER — OMEPRAZOLE 20 MG PO CPDR: 20.0000 mg | DELAYED_RELEASE_CAPSULE | Freq: Every morning | ORAL | Status: DC

## 2013-09-10 MED ORDER — LISINOPRIL-HYDROCHLOROTHIAZIDE 20-12.5 MG PO TABS: 1.0000 | ORAL_TABLET | Freq: Every evening | ORAL | Status: DC

## 2013-09-10 MED ORDER — ATORVASTATIN CALCIUM 40 MG PO TABS: 40.0000 mg | ORAL_TABLET | Freq: Every day | ORAL | Status: DC

## 2013-09-10 NOTE — Assessment & Plan Note (Signed)
Well controlled continue current medications  

## 2013-09-10 NOTE — Patient Instructions (Addendum)
Thank you for coming in today.  Blood pressure is great!!!  Cholesterol labs were high will start you on Lipitor. You will take this 1 time a day and you may experience some muscle aches.  Refilled lisinopril and omeprazole.  Contact us or go to the ER if your vision gets worse/lose vision

## 2013-09-10 NOTE — Progress Notes (Signed)
   Subjective:    Patient ID: Kayla Lowe, female    DOB: Oct 15, 1955, 58 y.o.   MRN: [id number]  HPI HYPERTENSION Home BP readings: no Chest Pain: no Lightheadedness or Syncope: no Leg Swelling: no   Medications When took last medication:  today Misses taking medications:  no  Diet Ability to limit unhealthy foods:  yes  Exercise Frequency: no Type: N/A   Monitoring Labs and Parameters Last A1C:  No results found for this basename: HGBA1C    CHOLESTEROL Never taken medication before.  Thinks mom had high cholesterol.  Told she did in the past.  No current heart disease or chest pain   Last Lipid:     Component Value Date/Time   CHOL 303* 07/24/2013 0917   HDL 57 07/24/2013 0917    Last Bmet  Potassium  Date Value Ref Range Status  07/17/2013 4.2  3.5 - 5.3 mEq/L Final     Sodium  Date Value Ref Range Status  07/17/2013 140  135 - 145 mEq/L Final     Creat  Date Value Ref Range Status  07/17/2013 0.94  0.50 - 1.10 mg/dL Final     Creatinine, Ser  Date Value Ref Range Status  07/10/2013 0.99  0.50 - 1.10 mg/dL Final      Last BPs:  BP Readings from Last 3 Encounters:  09/10/13 128/86  08/30/13 142/76  07/17/13 178/93    Weight history:  Wt Readings from Last 3 Encounters:  09/10/13 202 lb 6.4 oz (91.808 kg)  08/30/13 197 lb (89.359 kg)  07/17/13 197 lb (89.359 kg)     Eye Flashes Had episode last week of light flashing in left eye.  Feels well now.    Review of Symptoms - see HPI PMH - Smoking status noted.      Review of Systems     Objective:   Physical Exam  Heart - Regular rate and rhythm.  No murmurs, gallops or rubs.    Lungs:  Normal respiratory effort, chest expands symmetrically. Lungs are clear to auscultation, no crackles or wheezes. Extremities:  No cyanosis, edema, or deformity noted with good range of motion of all major joints.   Eye - Pupils Equal Round Reactive to light, Extraocular movements intact,  Fundi without hemorrhage or visible lesions, Conjunctiva without redness or discharge        Assessment & Plan:

## 2013-09-10 NOTE — Assessment & Plan Note (Signed)
LDL 221.  Will start lipitor for primary prevention

## 2013-11-11 ENCOUNTER — Telehealth: Payer: Self-pay | Admitting: *Deleted

## 2013-11-11 NOTE — Telephone Encounter (Signed)
Patient called stating she will be needing a refill on her Amlodipine, Lisinopril-HCTZ, and Atorvastatin. Patient states she noticed she has been coughing and having muscle cramps. Informed patient not to take the Lisinopril-HCTZ. Patient scheduled for nurse visit for 11/12/2013 at 0930. Patient verbalized understanding. Annamaria Hellingose,Kariya Lavergne Renee, RN

## 2013-11-12 ENCOUNTER — Ambulatory Visit: Payer: No Typology Code available for payment source | Attending: Internal Medicine | Admitting: *Deleted

## 2013-11-12 VITALS — BP 130/70 | HR 89 | Temp 98.1°F | Resp 16

## 2013-11-12 DIAGNOSIS — K219 Gastro-esophageal reflux disease without esophagitis: Secondary | ICD-10-CM

## 2013-11-12 DIAGNOSIS — R252 Cramp and spasm: Secondary | ICD-10-CM

## 2013-11-12 DIAGNOSIS — I10 Essential (primary) hypertension: Secondary | ICD-10-CM

## 2013-11-12 DIAGNOSIS — E785 Hyperlipidemia, unspecified: Secondary | ICD-10-CM

## 2013-11-12 LAB — BASIC METABOLIC PANEL
BUN: 11 mg/dL (ref 6–23)
CALCIUM: 10.2 mg/dL (ref 8.4–10.5)
CO2: 29 meq/L (ref 19–32)
Chloride: 106 mEq/L (ref 96–112)
Creat: 0.88 mg/dL (ref 0.50–1.10)
GLUCOSE: 102 mg/dL — AB (ref 70–99)
Potassium: 4.1 mEq/L (ref 3.5–5.3)
Sodium: 142 mEq/L (ref 135–145)

## 2013-11-12 MED ORDER — OMEPRAZOLE 20 MG PO CPDR: 20.0000 mg | DELAYED_RELEASE_CAPSULE | Freq: Every morning | ORAL | Status: DC

## 2013-11-12 MED ORDER — ATORVASTATIN CALCIUM 40 MG PO TABS: 40.0000 mg | ORAL_TABLET | Freq: Every day | ORAL | Status: DC

## 2013-11-12 MED ORDER — LOSARTAN POTASSIUM 25 MG PO TABS: 25.0000 mg | ORAL_TABLET | Freq: Every day | ORAL | Status: DC

## 2013-11-12 NOTE — Patient Instructions (Signed)
Potassium Content of Foods  Potassium is a mineral found in many foods and drinks. It helps keep fluids and minerals balanced in your body and affects how steadily your heart beats. Potassium also helps control your blood pressure and keep your muscles and nervous system healthy.  Certain health conditions and medicines may change the balance of potassium in your body. When this happens, you can help balance your level of potassium through the foods that you do or do not eat. Your health care provider or dietitian may recommend an amount of potassium that you should have each day. The following lists of foods provide the amount of potassium (in parentheses) per serving in each item.  HIGH IN POTASSIUM   The following foods and beverages have 200 mg or more of potassium per serving:  · Apricots, 2 raw or 5 dry (200 mg).  · Artichoke, 1 medium (345 mg).  · Avocado, raw,  ¼ each (245 mg).  · Banana, 1 medium (425 mg).  · Beans, lima, or baked beans, canned, ½ cup (280 mg).  · Beans, white, canned, ½ cup (595 mg).  · Beef roast, 3 oz (320 mg).  · Beef, ground, 3 oz (270 mg).  · Beets, raw or cooked, ½ cup (260 mg).  · Bran muffin, 2 oz (300 mg).  · Broccoli, ½ cup (230 mg).  · Brussels sprouts, ½ cup (250 mg).  · Cantaloupe, ½ cup (215 mg).  · Cereal, 100% bran, ½ cup (200-400 mg).  · Cheeseburger, single, fast food, 1 each (225-400 mg).  · Chicken, 3 oz (220 mg).  · Clams, canned, 3 oz (535 mg).  · Crab, 3 oz (225 mg).  · Dates, 5 each (270 mg).  · Dried beans and peas, ½ cup (300-475 mg).  · Figs, dried, 2 each (260 mg).  · Fish: halibut, tuna, cod, snapper, 3 oz (480 mg).  · Fish: salmon, haddock, swordfish, perch, 3 oz (300 mg).  · Fish, tuna, canned 3 oz (200 mg).  · French fries, fast food, 3 oz (470 mg).  · Granola with fruit and nuts, ½ cup (200 mg).  · Grapefruit juice, ½ cup (200 mg).  · Greens, beet, ½ cup (655 mg).  · Honeydew melon, ½ cup (200 mg).  · Kale, raw, 1 cup (300 mg).  · Kiwi, 1 medium (240  mg).  · Kohlrabi, rutabaga, parsnips, ½ cup (280 mg).  · Lentils, ½ cup (365 mg).  · Mango, 1 each (325 mg).  · Milk, chocolate, 1 cup (420 mg).  · Milk: nonfat, low-fat, whole, buttermilk, 1 cup (350-380 mg).  · Molasses, 1 Tbsp (295 mg).  · Mushrooms, ½ cup (280) mg.  · Nectarine, 1 each (275 mg).  · Nuts: almonds, peanuts, hazelnuts, [place], cashew, mixed, 1 oz (200 mg).  · Nuts, pistachios, 1 oz (295 mg).  · Orange, 1 each (240 mg).  · Orange juice, ½ cup (235 mg).  · Papaya, medium, ½ fruit (390 mg).  · Peanut butter, chunky, 2 Tbsp (240 mg).  · Peanut butter, smooth, 2 Tbsp (210 mg).  · Pear, 1 medium (200 mg).  · Pomegranate, 1 whole (400 mg).  · Pomegranate juice, ½ cup (215 mg).  · Pork, 3 oz (350 mg).  · Potato chips, salted, 1 oz (465 mg).  · Potato, baked with skin, 1 medium (925 mg).  · Potatoes, boiled, ½ cup (255 mg).  · Potatoes, mashed, ½ cup (330 mg).  · Prune juice, ½ cup (  370 mg).  · Prunes, 5 each (305 mg).  · Pudding, chocolate, ½ cup (230 mg).  · Pumpkin, canned, ½ cup (250 mg).  · Raisins, seedless, ¼ cup (270 mg).  · Seeds, sunflower or pumpkin, 1 oz (240 mg).  · Soy milk, 1 cup (300 mg).  · Spinach, ½ cup (420 mg).  · Spinach, canned, ½ cup (370 mg).  · Sweet potato, baked with skin, 1 medium (450 mg).  · Swiss chard, ½ cup (480 mg).  · Tomato or vegetable juice, ½ cup (275 mg).  · Tomato sauce or puree, ½ cup (400-550 mg).  · Tomato, raw, 1 medium (290 mg).  · Tomatoes, canned, ½ cup (200-300 mg).  · Turkey, 3 oz (250 mg).  · Wheat germ, 1 oz (250 mg).  · Winter squash, ½ cup (250 mg).  · Yogurt, plain or fruited, 6 oz (260-435 mg).  · Zucchini, ½ cup (220 mg).  MODERATE IN POTASSIUM  The following foods and beverages have 50-200 mg of potassium per serving:  · Apple, 1 each (150 mg).  · Apple juice, ½ cup (150 mg).  · Applesauce, ½ cup (90 mg).  · Apricot nectar, ½ cup (140 mg).  · Asparagus, small spears, ½ cup or 6 spears (155 mg).  · Bagel, cinnamon raisin, 1 each (130 mg).  · Bagel,  egg or plain, 4 in., 1 each (70 mg).  · Beans, green, ½ cup (90 mg).  · Beans, yellow, ½ cup (190 mg).  · Beer, regular, 12 oz (100 mg).  · Beets, canned, ½ cup (125 mg).  · Blackberries, ½ cup (115 mg).  · Blueberries, ½ cup (60 mg).  · Bread, whole wheat, 1 slice (70 mg).  · Broccoli, raw, ½ cup (145 mg).  · Cabbage, ½ cup (150 mg).  · Carrots, cooked or raw, ½ cup (180 mg).  · Cauliflower, raw, ½ cup (150 mg).  · Celery, raw, ½ cup (155 mg).  · Cereal, bran flakes, ½cup (120-150 mg).  · Cheese, cottage, ½ cup (110 mg).  · Cherries, 10 each (150 mg).  · Chocolate, 1½ oz bar (165 mg).  · Coffee, brewed 6 oz (90 mg).  · Corn, ½ cup or 1 ear (195 mg).  · Cucumbers, ½ cup (80 mg).  · Egg, large, 1 each (60 mg).  · Eggplant, ½ cup (60 mg).  · Endive, raw, ½cup (80 mg).  · English muffin, 1 each (65 mg).  · Fish, orange roughy, 3 oz (150 mg).  · Frankfurter, beef or pork, 1 each (75 mg).  · Fruit cocktail, ½ cup (115 mg).  · Grape juice, ½ cup (170 mg).  · Grapefruit, ½ fruit (175 mg).  · Grapes, ½ cup (155 mg).  · Greens: kale, turnip, collard, ½ cup (110-150 mg).  · Ice cream or frozen yogurt, chocolate, ½ cup (175 mg).  · Ice cream or frozen yogurt, vanilla, ½ cup (120-150 mg).  · Lemons, limes, 1 each (80 mg).  · Lettuce, all types, 1 cup (100 mg).  · Mixed vegetables, ½ cup (150 mg).  · Mushrooms, raw, ½ cup (110 mg).  · Nuts: walnuts, pecans, or macadamia, 1 oz (125 mg).  · Oatmeal, ½ cup (80 mg).  · Okra, ½ cup (110 mg).  · Onions, raw, ½ cup (120 mg).  · Peach, 1 each (185 mg).  · Peaches, canned, ½ cup (120 mg).  · Pears, canned, ½ cup (120 mg).  · Peas, green,   frozen, ½ cup (90 mg).  · Peppers, green, ½ cup (130 mg).  · Peppers, red, ½ cup (160 mg).  · Pineapple juice, ½ cup (165 mg).  · Pineapple, fresh or canned, ½ cup (100 mg).  · Plums, 1 each (105 mg).  · Pudding, vanilla, ½ cup (150 mg).  · Raspberries, ½ cup (90 mg).  · Rhubarb, ½ cup (115 mg).  · Rice, wild, ½ cup (80 mg).  · Shrimp, 3 oz (155  mg).  · Spinach, raw, 1 cup (170 mg).  · Strawberries, ½ cup (125 mg).  · Summer squash ½ cup (175-200 mg).  · Swiss chard, raw, 1 cup (135 mg).  · Tangerines, 1 each (140 mg).  · Tea, brewed, 6 oz (65 mg).  · Turnips, ½ cup (140 mg).  · Watermelon, ½ cup (85 mg).  · Wine, red, table, 5 oz (180 mg).  · Wine, white, table, 5 oz (100 mg).  LOW IN POTASSIUM  The following foods and beverages have less than 50 mg of potassium per serving.  · Bread, white, 1 slice (30 mg).  · Carbonated beverages, 12 oz (less than 5 mg).  · Cheese, 1 oz (20-30 mg).  · Cranberries, ½ cup (45 mg).  · Cranberry juice cocktail, ½ cup (20 mg).  · Fats and oils, 1 Tbsp (less than 5 mg).  · Hummus, 1 Tbsp (32 mg).  · Nectar: papaya, mango, or pear, ½ cup (35 mg).  · Rice, white or brown, ½ cup (50 mg).  · Spaghetti or macaroni, ½ cup cooked (30 mg).  · Tortilla, flour or corn, 1 each (50 mg).  · Waffle, 4 in., 1 each (50 mg).  · Water chestnuts, ½ cup (40 mg).  Document Released: 12/12/2004 Document Revised: 05/05/2013 Document Reviewed: 03/27/2013  ExitCare® Patient Information ©2015 [employer]. This information is not intended to replace advice given to you by your health care provider. Make sure you discuss any questions you have with your health care provider.

## 2013-11-12 NOTE — Progress Notes (Signed)
Patient presents today for problems with medication. Patient states she takes Amlodipine, Atorvastatin, and Lisinopril-HCTZ at 10pm. Patient states at 11PM at night she continuously coughing, then two hours later she has muscle spasms. Patient needs refills on Atorvastatin and Omeprazole. Consulted with Dr. Orpah CobbAdvani who discontinued Lisinopril-HCTZ. Verbal order given for Losartan 25 mg once daily and BMP, verbal order to refill Atorvastatin and Omeprazole. Per Dr. Orpah CobbAdvani patient is to come back in 2 weeks for BP check and needs appt.

## 2013-11-20 ENCOUNTER — Encounter: Payer: Self-pay | Admitting: Internal Medicine

## 2013-11-20 ENCOUNTER — Ambulatory Visit: Payer: No Typology Code available for payment source | Attending: Internal Medicine | Admitting: Internal Medicine

## 2013-11-20 VITALS — BP 137/76 | HR 80 | Temp 98.4°F | Resp 16 | Ht 65.0 in | Wt 203.0 lb

## 2013-11-20 DIAGNOSIS — Z1211 Encounter for screening for malignant neoplasm of colon: Secondary | ICD-10-CM

## 2013-11-20 DIAGNOSIS — E785 Hyperlipidemia, unspecified: Secondary | ICD-10-CM | POA: Insufficient documentation

## 2013-11-20 DIAGNOSIS — Z139 Encounter for screening, unspecified: Secondary | ICD-10-CM

## 2013-11-20 DIAGNOSIS — J309 Allergic rhinitis, unspecified: Secondary | ICD-10-CM | POA: Insufficient documentation

## 2013-11-20 DIAGNOSIS — M5380 Other specified dorsopathies, site unspecified: Secondary | ICD-10-CM

## 2013-11-20 DIAGNOSIS — J3089 Other allergic rhinitis: Secondary | ICD-10-CM

## 2013-11-20 DIAGNOSIS — I10 Essential (primary) hypertension: Secondary | ICD-10-CM | POA: Insufficient documentation

## 2013-11-20 DIAGNOSIS — M62838 Other muscle spasm: Secondary | ICD-10-CM | POA: Insufficient documentation

## 2013-11-20 DIAGNOSIS — M62830 Muscle spasm of back: Secondary | ICD-10-CM

## 2013-11-20 DIAGNOSIS — Z79899 Other long term (current) drug therapy: Secondary | ICD-10-CM | POA: Insufficient documentation

## 2013-11-20 MED ORDER — CYCLOBENZAPRINE HCL 10 MG PO TABS: 10.0000 mg | ORAL_TABLET | Freq: Every day | ORAL | Status: DC

## 2013-11-20 MED ORDER — FLUTICASONE PROPIONATE 50 MCG/ACT NA SUSP: 2.0000 | Freq: Every day | NASAL | Status: DC
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
## 2013-11-20 NOTE — Patient Instructions (Signed)
DASH Eating Plan  DASH stands for "Dietary Approaches to Stop Hypertension." The DASH eating plan is a healthy eating plan that has been shown to reduce high blood pressure (hypertension). Additional health benefits may include reducing the risk of type 2 diabetes mellitus, heart disease, and stroke. The DASH eating plan may also help with weight loss.  WHAT DO I NEED TO KNOW ABOUT THE DASH EATING PLAN?  For the DASH eating plan, you will follow these general guidelines:  · Choose foods with a percent daily value for sodium of less than 5% (as listed on the food label).  · Use salt-free seasonings or herbs instead of table salt or sea salt.  · Check with your health care provider or pharmacist before using salt substitutes.  · Eat lower-sodium products, often labeled as "lower sodium" or "no salt added."  · Eat fresh foods.  · Eat more vegetables, fruits, and low-fat dairy products.  · Choose whole grains. Look for the word "whole" as the first word in the ingredient list.  · Choose fish and skinless chicken or turkey more often than red meat. Limit fish, poultry, and meat to 6 oz (170 g) each day.  · Limit sweets, desserts, sugars, and sugary drinks.  · Choose heart-healthy fats.  · Limit cheese to 1 oz (28 g) per day.  · Eat more home-cooked food and less restaurant, buffet, and fast food.  · Limit fried foods.  · Cook foods using methods other than frying.  · Limit canned vegetables. If you do use them, rinse them well to decrease the sodium.  · When eating at a restaurant, ask that your food be prepared with less salt, or no salt if possible.  WHAT FOODS CAN I EAT?  Seek help from a dietitian for individual calorie needs.  Grains  Whole grain or whole wheat bread. Brown rice. Whole grain or whole wheat pasta. Quinoa, bulgur, and whole grain cereals. Low-sodium cereals. Corn or whole wheat flour tortillas. Whole grain cornbread. Whole grain crackers. Low-sodium crackers.  Vegetables  Fresh or frozen vegetables  (raw, steamed, roasted, or grilled). Low-sodium or reduced-sodium tomato and vegetable juices. Low-sodium or reduced-sodium tomato sauce and paste. Low-sodium or reduced-sodium canned vegetables.   Fruits  All fresh, canned (in natural juice), or frozen fruits.  Meat and Other Protein Products  Ground beef (85% or leaner), grass-fed beef, or beef trimmed of fat. Skinless chicken or turkey. Ground chicken or turkey. Pork trimmed of fat. All fish and seafood. Eggs. Dried beans, peas, or lentils. Unsalted nuts and seeds. Unsalted canned beans.  Dairy  Low-fat dairy products, such as skim or 1% milk, 2% or reduced-fat cheeses, low-fat ricotta or cottage cheese, or plain low-fat yogurt. Low-sodium or reduced-sodium cheeses.  Fats and Oils  Tub margarines without trans fats. Light or reduced-fat mayonnaise and salad dressings (reduced sodium). Avocado. Safflower, olive, or canola oils. Natural peanut or almond butter.  Other  Unsalted popcorn and pretzels.  The items listed above may not be a complete list of recommended foods or beverages. Contact your dietitian for more options.  WHAT FOODS ARE NOT RECOMMENDED?  Grains  White bread. White pasta. White rice. Refined cornbread. Bagels and croissants. Crackers that contain trans fat.  Vegetables  Creamed or fried vegetables. Vegetables in a cheese sauce. Regular canned vegetables. Regular canned tomato sauce and paste. Regular tomato and vegetable juices.  Fruits  Dried fruits. Canned fruit in light or heavy syrup. Fruit juice.  Meat and Other Protein   Products  Fatty cuts of meat. Ribs, chicken wings, bacon, sausage, bologna, salami, chitterlings, fatback, hot dogs, bratwurst, and packaged luncheon meats. Salted nuts and seeds. Canned beans with salt.  Dairy  Whole or 2% milk, cream, half-and-half, and cream cheese. Whole-fat or sweetened yogurt. Full-fat cheeses or blue cheese. Nondairy creamers and whipped toppings. Processed cheese, cheese spreads, or cheese  curds.  Condiments  Onion and garlic salt, seasoned salt, table salt, and sea salt. Canned and packaged gravies. Worcestershire sauce. Tartar sauce. Barbecue sauce. Teriyaki sauce. Soy sauce, including reduced sodium. Steak sauce. Fish sauce. Oyster sauce. Cocktail sauce. Horseradish. Ketchup and mustard. Meat flavorings and tenderizers. Bouillon cubes. Hot sauce. Tabasco sauce. Marinades. Taco seasonings. Relishes.  Fats and Oils  Butter, stick margarine, lard, shortening, ghee, and bacon fat. Coconut, palm kernel, or palm oils. Regular salad dressings.  Other  Pickles and olives. Salted popcorn and pretzels.  The items listed above may not be a complete list of foods and beverages to avoid. Contact your dietitian for more information.  WHERE CAN I FIND MORE INFORMATION?  [employer], [employer], [employer]: www.nhlbi.nih.gov/health/health-topics/topics/dash/  Document Released: 04/19/2011 Document Revised: 05/05/2013 Document Reviewed: 03/04/2013  Exit[employer]® Patient Information ©2015 [employer]. This information is not intended to replace advice given to you by your health care provider. Make sure you discuss any questions you have with your health care provider.

## 2013-11-20 NOTE — Progress Notes (Signed)
Pt is here following up on her HTN, hyperlipidemia and her chronic lower back pain. Pt states that she might have a sinus infection.

## 2013-11-26 ENCOUNTER — Ambulatory Visit: Payer: No Typology Code available for payment source | Attending: Internal Medicine

## 2013-11-26 DIAGNOSIS — E785 Hyperlipidemia, unspecified: Secondary | ICD-10-CM

## 2013-11-26 LAB — LIPID PANEL
CHOLESTEROL: 200 mg/dL (ref 0–200)
HDL: 50 mg/dL (ref >39)
LDL Cholesterol: 133 mg/dL — ABNORMAL HIGH (ref 0–99)
Total CHOL/HDL Ratio: 4 Ratio
Triglycerides: 86 mg/dL (ref <150)
VLDL: 17 mg/dL (ref 0–40)

## 2013-11-27 ENCOUNTER — Telehealth: Payer: Self-pay

## 2013-11-27 ENCOUNTER — Telehealth: Payer: Self-pay | Admitting: *Deleted

## 2013-11-27 NOTE — Telephone Encounter (Signed)
Message copied by Lestine MountJUAREZ, Mabrey Howland L on Fri Nov 27, 2013 10:16 AM ------      Message from: Doris CheadleADVANI, DEEPAK      Created: Fri Nov 27, 2013  9:22 AM       Call and let the patient know that her cholesterol is improved continue with low-fat diet and Lipitor as prescribed. ------

## 2013-11-27 NOTE — Telephone Encounter (Signed)
Patient is aware of her lab results 

## 2013-11-27 NOTE — Progress Notes (Signed)
I spoke with the pt and informed her of her lab results.

## 2013-12-18 ENCOUNTER — Ambulatory Visit: Payer: No Typology Code available for payment source | Attending: Internal Medicine

## 2014-01-05 ENCOUNTER — Ambulatory Visit (AMBULATORY_SURGERY_CENTER): Payer: Self-pay | Admitting: *Deleted

## 2014-01-05 VITALS — Ht 63.5 in | Wt 202.2 lb

## 2014-01-05 DIAGNOSIS — Z1211 Encounter for screening for malignant neoplasm of colon: Secondary | ICD-10-CM

## 2014-01-05 NOTE — Progress Notes (Signed)
No allergies to eggs or soy. No problems with anesthesia.  Pt given Emmi instructions for colonoscopy  No oxygen use  No diet drug use  

## 2014-01-12 ENCOUNTER — Other Ambulatory Visit: Payer: Self-pay | Admitting: Internal Medicine

## 2014-01-12 DIAGNOSIS — I10 Essential (primary) hypertension: Secondary | ICD-10-CM

## 2014-01-14 NOTE — Addendum Note (Signed)
Addended by: Maple Hudson on: 01/14/2014 03:18 PM   Modules accepted: Level of Service

## 2014-01-20 ENCOUNTER — Encounter: Payer: Self-pay | Admitting: Gastroenterology

## 2014-01-20 ENCOUNTER — Ambulatory Visit (AMBULATORY_SURGERY_CENTER): Payer: No Typology Code available for payment source | Admitting: Gastroenterology

## 2014-01-20 VITALS — BP 121/74 | HR 65 | Temp 97.6°F | Resp 20 | Ht 63.0 in | Wt 202.0 lb

## 2014-01-20 DIAGNOSIS — Z1211 Encounter for screening for malignant neoplasm of colon: Secondary | ICD-10-CM

## 2014-01-20 DIAGNOSIS — K5730 Diverticulosis of large intestine without perforation or abscess without bleeding: Secondary | ICD-10-CM

## 2014-01-20 MED ORDER — SODIUM CHLORIDE 0.9 % IV SOLN: 500.0000 mL | INTRAVENOUS | Status: DC

## 2014-01-20 NOTE — Patient Instructions (Signed)
YOU HAD AN ENDOSCOPIC PROCEDURE TODAY AT THE  ENDOSCOPY CENTER: Refer to the procedure report that was given to you for any specific questions about what was found during the examination.  If the procedure report does not answer your questions, please call your gastroenterologist to clarify.  If you requested that your care partner not be given the details of your procedure findings, then the procedure report has been included in a sealed envelope for you to review at your convenience later.  YOU SHOULD EXPECT: Some feelings of bloating in the abdomen. Passage of more gas than usual.  Walking can help get rid of the air that was put into your GI tract during the procedure and reduce the bloating. If you had a lower endoscopy (such as a colonoscopy or flexible sigmoidoscopy) you may notice spotting of blood in your stool or on the toilet paper. If you underwent a bowel prep for your procedure, then you may not have a normal bowel movement for a few days.  DIET: Your first meal following the procedure should be a light meal and then it is ok to progress to your normal diet.  A half-sandwich or bowl of soup is an example of a good first meal.  Heavy or fried foods are harder to digest and may make you feel nauseous or bloated.  Likewise meals heavy in dairy and vegetables can cause extra gas to form and this can also increase the bloating.  Drink plenty of fluids but you should avoid alcoholic beverages for 24 hours.  ACTIVITY: Your care partner should take you home directly after the procedure.  You should plan to take it easy, moving slowly for the rest of the day.  You can resume normal activity the day after the procedure however you should NOT DRIVE or use heavy machinery for 24 hours (because of the sedation medicines used during the test).    SYMPTOMS TO REPORT IMMEDIATELY: A gastroenterologist can be reached at any hour.  During normal business hours, 8:30 AM to 5:00 PM Monday through Friday,  call [phone].  After hours and on weekends, please call the GI answering service at [phone] who will take a message and have the physician on call contact you.   Following lower endoscopy (colonoscopy or flexible sigmoidoscopy):  Excessive amounts of blood in the stool  Significant tenderness or worsening of abdominal pains  Swelling of the abdomen that is new, acute  Fever of 100F or higher   FOLLOW UP: If any biopsies were taken you will be contacted by phone or by letter within the next 1-3 weeks.  Call your gastroenterologist if you have not heard about the biopsies in 3 weeks.  Our staff will call the home number listed on your records the next business day following your procedure to check on you and address any questions or concerns that you may have at that time regarding the information given to you following your procedure. This is a courtesy call and so if there is no answer at the home number and we have not heard from you through the emergency physician on call, we will assume that you have returned to your regular daily activities without incident.  SIGNATURES/CONFIDENTIALITY: You and/or your care partner have signed paperwork which will be entered into your electronic medical record.  These signatures attest to the fact that that the information above on your After Visit Summary has been reviewed and is understood.  Full responsibility of the confidentiality of   this discharge information lies with you and/or your care-partner.   Diverticulosis-handout given  Repeat colonoscopy in 10 years.   

## 2014-01-20 NOTE — Progress Notes (Signed)
Patient awakening, vss report to rn 

## 2014-01-20 NOTE — Op Note (Signed)
 [facility] [address] [place], [address]   COLONOSCOPY PROCEDURE REPORT  PATIENT: Kayla Lowe, Kayla Lowe  MR#: [id number] BIRTHDATE: 1955-08-29 , 58  yrs. old GENDER: Female ENDOSCOPIST: Rachael Fee, MD REFERRED XB:JYNWGN Advani, MD PROCEDURE DATE:  01/20/2014 PROCEDURE:   Colonoscopy, screening First Screening Colonoscopy - Avg.  risk and is 50 yrs.  old or older Yes.  Prior Negative Screening - Now for repeat screening. N/A  History of Adenoma - Now for follow-up colonoscopy & has been > or = to 3 yrs.  N/A  Polyps Removed Today? No.  Recommend repeat exam, <10 yrs? No. ASA CLASS:   Class III INDICATIONS:average risk screening. MEDICATIONS: MAC sedation, administered by CRNA and propofol (Diprivan)  IV  DESCRIPTION OF PROCEDURE:   After the risks benefits and alternatives of the procedure were thoroughly explained, informed consent was obtained.  A digital rectal exam revealed no abnormalities of the rectum.   The LB [id number]  endoscope was introduced through the anus and advanced to the cecum, which was identified by both the appendix and ileocecal valve. No adverse events experienced.   The quality of the prep was good.  The instrument was then slowly withdrawn as the colon was fully examined.  COLON FINDINGS: There were numerous diverticulum in the left colon, associated with mucosal edema, tortuous lumen.  The examination was otherwise normal.  Retroflexed views revealed no abnormalities. The time to cecum=2 minutes 33 seconds.  Withdrawal time=8 minutes 12 seconds.  The scope was withdrawn and the procedure completed. COMPLICATIONS: There were no complications.  ENDOSCOPIC IMPRESSION: There were numerous diverticulum in the left colon, associated with mucosal edema, tortuous lumen.  The examination was otherwise normal.  RECOMMENDATIONS: You should continue to follow colorectal cancer screening guidelines for "routine  risk" patients with a repeat colonoscopy in 10 years.   eSigned:  Rachael Fee, MD 01/20/2014 11:30 AM

## 2014-01-21 ENCOUNTER — Telehealth: Payer: Self-pay | Admitting: *Deleted

## 2014-01-21 NOTE — Telephone Encounter (Signed)
  Follow up Call-  Call back number 01/20/2014  Post procedure Call Back phone  # [phone]  Permission to leave phone message Yes     Patient questions:  Do you have a fever, pain , or abdominal swelling? No. Pain Score  0 *  Have you tolerated food without any problems? Yes.    Have you been able to return to your normal activities? Yes.    Do you have any questions about your discharge instructions: Diet   No. Medications  No. Follow up visit  No.  Do you have questions or concerns about your Care? No.  Actions: * If pain score is 4 or above: No action needed, pain <4.

## 2014-02-17 ENCOUNTER — Encounter: Payer: Self-pay | Admitting: Internal Medicine

## 2014-02-17 ENCOUNTER — Ambulatory Visit: Payer: No Typology Code available for payment source | Attending: Internal Medicine | Admitting: Internal Medicine

## 2014-02-17 VITALS — BP 137/52 | HR 79 | Temp 98.4°F | Resp 18 | Ht 64.0 in | Wt 203.6 lb

## 2014-02-17 DIAGNOSIS — Z79899 Other long term (current) drug therapy: Secondary | ICD-10-CM | POA: Insufficient documentation

## 2014-02-17 DIAGNOSIS — I10 Essential (primary) hypertension: Secondary | ICD-10-CM

## 2014-02-17 DIAGNOSIS — Z139 Encounter for screening, unspecified: Secondary | ICD-10-CM

## 2014-02-17 DIAGNOSIS — R252 Cramp and spasm: Secondary | ICD-10-CM

## 2014-02-17 DIAGNOSIS — E785 Hyperlipidemia, unspecified: Secondary | ICD-10-CM

## 2014-02-17 DIAGNOSIS — Z23 Encounter for immunization: Secondary | ICD-10-CM

## 2014-02-17 DIAGNOSIS — Z0000 Encounter for general adult medical examination without abnormal findings: Secondary | ICD-10-CM

## 2014-02-17 DIAGNOSIS — K219 Gastro-esophageal reflux disease without esophagitis: Secondary | ICD-10-CM | POA: Insufficient documentation

## 2014-02-17 DIAGNOSIS — M1990 Unspecified osteoarthritis, unspecified site: Secondary | ICD-10-CM | POA: Insufficient documentation

## 2014-02-17 NOTE — Patient Instructions (Addendum)
DASH Eating Plan DASH stands for "Dietary Approaches to Stop Hypertension." The DASH eating plan is a healthy eating plan that has been shown to reduce high blood pressure (hypertension). Additional health benefits may include reducing the risk of type 2 diabetes mellitus, heart disease, and stroke. The DASH eating plan may also help with weight loss. WHAT DO I NEED TO KNOW ABOUT THE DASH EATING PLAN? For the DASH eating plan, you will follow these general guidelines:  Choose foods with a percent daily value for sodium of less than 5% (as listed on the food label).  Use salt-free seasonings or herbs instead of table salt or sea salt.  Check with your health care provider or pharmacist before using salt substitutes.  Eat lower-sodium products, often labeled as "lower sodium" or "no salt added."  Eat fresh foods.  Eat more vegetables, fruits, and low-fat dairy products.  Choose whole grains. Look for the word "whole" as the first word in the ingredient list.  Choose fish and skinless chicken or turkey more often than red meat. Limit fish, poultry, and meat to 6 oz (170 g) each day.  Limit sweets, desserts, sugars, and sugary drinks.  Choose heart-healthy fats.  Limit cheese to 1 oz (28 g) per day.  Eat more home-cooked food and less restaurant, buffet, and fast food.  Limit fried foods.  Cook foods using methods other than frying.  Limit canned vegetables. If you do use them, rinse them well to decrease the sodium.  When eating at a restaurant, ask that your food be prepared with less salt, or no salt if possible. WHAT FOODS CAN I EAT? Seek help from a dietitian for individual calorie needs. Grains Whole grain or whole wheat bread. Brown rice. Whole grain or whole wheat pasta. Quinoa, bulgur, and whole grain cereals. Low-sodium cereals. Corn or whole wheat flour tortillas. Whole grain cornbread. Whole grain crackers. Low-sodium crackers. Vegetables Fresh or frozen vegetables  (raw, steamed, roasted, or grilled). Low-sodium or reduced-sodium tomato and vegetable juices. Low-sodium or reduced-sodium tomato sauce and paste. Low-sodium or reduced-sodium canned vegetables.  Fruits All fresh, canned (in natural juice), or frozen fruits. Meat and Other Protein Products Ground beef (85% or leaner), grass-fed beef, or beef trimmed of fat. Skinless chicken or turkey. Ground chicken or turkey. Pork trimmed of fat. All fish and seafood. Eggs. Dried beans, peas, or lentils. Unsalted nuts and seeds. Unsalted canned beans. Dairy Low-fat dairy products, such as skim or 1% milk, 2% or reduced-fat cheeses, low-fat ricotta or cottage cheese, or plain low-fat yogurt. Low-sodium or reduced-sodium cheeses. Fats and Oils Tub margarines without trans fats. Light or reduced-fat mayonnaise and salad dressings (reduced sodium). Avocado. Safflower, olive, or canola oils. Natural peanut or almond butter. Other Unsalted popcorn and pretzels. The items listed above may not be a complete list of recommended foods or beverages. Contact your dietitian for more options. WHAT FOODS ARE NOT RECOMMENDED? Grains White bread. White pasta. White rice. Refined cornbread. Bagels and croissants. Crackers that contain trans fat. Vegetables Creamed or fried vegetables. Vegetables in a cheese sauce. Regular canned vegetables. Regular canned tomato sauce and paste. Regular tomato and vegetable juices. Fruits Dried fruits. Canned fruit in light or heavy syrup. Fruit juice. Meat and Other Protein Products Fatty cuts of meat. Ribs, chicken wings, bacon, sausage, bologna, salami, chitterlings, fatback, hot dogs, bratwurst, and packaged luncheon meats. Salted nuts and seeds. Canned beans with salt. Dairy Whole or 2% milk, cream, half-and-half, and cream cheese. Whole-fat or sweetened yogurt. Full-fat   cheeses or blue cheese. Nondairy creamers and whipped toppings. Processed cheese, cheese spreads, or cheese  curds. Condiments Onion and garlic salt, seasoned salt, table salt, and sea salt. Canned and packaged gravies. Worcestershire sauce. Tartar sauce. Barbecue sauce. Teriyaki sauce. Soy sauce, including reduced sodium. Steak sauce. Fish sauce. Oyster sauce. Cocktail sauce. Horseradish. Ketchup and mustard. Meat flavorings and tenderizers. Bouillon cubes. Hot sauce. Tabasco sauce. Marinades. Taco seasonings. Relishes. Fats and Oils Butter, stick margarine, lard, shortening, ghee, and bacon fat. Coconut, palm kernel, or palm oils. Regular salad dressings. Other Pickles and olives. Salted popcorn and pretzels. The items listed above may not be a complete list of foods and beverages to avoid. Contact your dietitian for more information. WHERE CAN I FIND MORE INFORMATION? National Heart, Lung, and Blood Institute: www.nhlbi.nih.gov/health/health-topics/topics/dash/ Document Released: 04/19/2011 Document Revised: 09/14/2013 Document Reviewed: 03/04/2013 ExitCare Patient Information 2015 [employer]. This information is not intended to replace advice given to you by your health care provider. Make sure you discuss any questions you have with your health care provider. Fat and Cholesterol Control Diet Fat and cholesterol levels in your blood and organs are influenced by your diet. High levels of fat and cholesterol may lead to diseases of the heart, small and large blood vessels, gallbladder, liver, and pancreas. CONTROLLING FAT AND CHOLESTEROL WITH DIET Although exercise and lifestyle factors are important, your diet is key. That is because certain foods are known to raise cholesterol and others to lower it. The goal is to balance foods for their effect on cholesterol and more importantly, to replace saturated and trans fat with other types of fat, such as monounsaturated fat, polyunsaturated fat, and omega-3 fatty acids. On average, a person should consume no more than 15 to 17 g of saturated fat daily.  Saturated and trans fats are considered "bad" fats, and they will raise LDL cholesterol. Saturated fats are primarily found in animal products such as meats, butter, and cream. However, that does not mean you need to give up all your favorite foods. Today, there are good tasting, low-fat, low-cholesterol substitutes for most of the things you like to eat. Choose low-fat or nonfat alternatives. Choose round or loin cuts of red meat. These types of cuts are lowest in fat and cholesterol. Chicken (without the skin), fish, veal, and ground turkey breast are great choices. Eliminate fatty meats, such as hot dogs and salami. Even shellfish have little or no saturated fat. Have a 3 oz (85 g) portion when you eat lean meat, poultry, or fish. Trans fats are also called "partially hydrogenated oils." They are oils that have been scientifically manipulated so that they are solid at room temperature resulting in a longer shelf life and improved taste and texture of foods in which they are added. Trans fats are found in stick margarine, some tub margarines, cookies, crackers, and baked goods.  When baking and cooking, oils are a great substitute for butter. The monounsaturated oils are especially beneficial since it is believed they lower LDL and raise HDL. The oils you should avoid entirely are saturated tropical oils, such as coconut and palm.  Remember to eat a lot from food groups that are naturally free of saturated and trans fat, including fish, fruit, vegetables, beans, grains (barley, rice, couscous, bulgur wheat), and pasta (without cream sauces).  IDENTIFYING FOODS THAT LOWER FAT AND CHOLESTEROL  Soluble fiber may lower your cholesterol. This type of fiber is found in fruits such as apples, vegetables such as broccoli, potatoes, and carrots,   legumes such as beans, peas, and lentils, and grains such as barley. Foods fortified with plant sterols (phytosterol) may also lower cholesterol. You should eat at least 2 g  per day of these foods for a cholesterol lowering effect.  Read package labels to identify low-saturated fats, trans fat free, and low-fat foods at the supermarket. Select cheeses that have only 2 to 3 g saturated fat per ounce. Use a heart-healthy tub margarine that is free of trans fats or partially hydrogenated oil. When buying baked goods (cookies, crackers), avoid partially hydrogenated oils. Breads and muffins should be made from whole grains (whole-wheat or whole oat flour, instead of "flour" or "enriched flour"). Buy non-creamy canned soups with reduced salt and no added fats.  FOOD PREPARATION TECHNIQUES  Never deep-fry. If you must fry, either stir-fry, which uses very little fat, or use non-stick cooking sprays. When possible, broil, bake, or roast meats, and steam vegetables. Instead of putting butter or margarine on vegetables, use lemon and herbs, applesauce, and cinnamon (for squash and sweet potatoes). Use nonfat yogurt, salsa, and low-fat dressings for salads.  LOW-SATURATED FAT / LOW-FAT FOOD SUBSTITUTES Meats / Saturated Fat (g)  Avoid: Steak, marbled (3 oz/85 g) / 11 g  Choose: Steak, lean (3 oz/85 g) / 4 g  Avoid: Hamburger (3 oz/85 g) / 7 g  Choose: Hamburger, lean (3 oz/85 g) / 5 g  Avoid: Ham (3 oz/85 g) / 6 g  Choose: Ham, lean cut (3 oz/85 g) / 2.4 g  Avoid: Chicken, with skin, dark meat (3 oz/85 g) / 4 g  Choose: Chicken, skin removed, dark meat (3 oz/85 g) / 2 g  Avoid: Chicken, with skin, light meat (3 oz/85 g) / 2.5 g  Choose: Chicken, skin removed, light meat (3 oz/85 g) / 1 g Dairy / Saturated Fat (g)  Avoid: Whole milk (1 cup) / 5 g  Choose: Low-fat milk, 2% (1 cup) / 3 g  Choose: Low-fat milk, 1% (1 cup) / 1.5 g  Choose: Skim milk (1 cup) / 0.3 g  Avoid: Hard cheese (1 oz/28 g) / 6 g  Choose: Skim milk cheese (1 oz/28 g) / 2 to 3 g  Avoid: Cottage cheese, 4% fat (1 cup) / 6.5 g  Choose: Low-fat cottage cheese, 1% fat (1 cup) / 1.5  g  Avoid: Ice cream (1 cup) / 9 g  Choose: Sherbet (1 cup) / 2.5 g  Choose: Nonfat frozen yogurt (1 cup) / 0.3 g  Choose: Frozen fruit bar / trace  Avoid: Whipped cream (1 tbs) / 3.5 g  Choose: Nondairy whipped topping (1 tbs) / 1 g Condiments / Saturated Fat (g)  Avoid: Mayonnaise (1 tbs) / 2 g  Choose: Low-fat mayonnaise (1 tbs) / 1 g  Avoid: Butter (1 tbs) / 7 g  Choose: Extra light margarine (1 tbs) / 1 g  Avoid: Coconut oil (1 tbs) / 11.8 g  Choose: Olive oil (1 tbs) / 1.8 g  Choose: Corn oil (1 tbs) / 1.7 g  Choose: Safflower oil (1 tbs) / 1.2 g  Choose: Sunflower oil (1 tbs) / 1.4 g  Choose: Soybean oil (1 tbs) / 2.4 g  Choose: Canola oil (1 tbs) / 1 g Document Released: 04/30/2005 Document Revised: 08/25/2012 Document Reviewed: 07/29/2013 ExitCare Patient Information 2015 [employer]. This information is not intended to replace advice given to you by your health care provider. Make sure you discuss any questions you have with your health care   provider.  

## 2014-02-17 NOTE — Progress Notes (Signed)
Patient here for 3 month follow up Patient complains of moderate leg cramping Patient request flu vaccine and pneumococcal vaccine

## 2014-02-17 NOTE — Progress Notes (Signed)
Patient Demographics  Kayla Lowe, is a 58 y.o. female  WUJ:[id number]  [id number]  DOB - Oct 03, 1955  CC:  Chief Complaint  Patient presents with  . Follow-up       HPI: Sema Stangler is a 58 y.o. female here today for follow up . Patient has history of hypertension hyperlipidemia, as per patient she is compliant with her medications and her blood pressure is well controlled, she recently had her colonoscopy done which was reported to be normal and she is due in 10 years, patient still has not had mammogram done. Patient will like to get a flu shot and pneumonia shot today. Patient still complains of leg cramps, she was prescribed Flexeril which she takes on and off. Patient has No headache, No chest pain, No abdominal pain - No Nausea, No new weakness tingling or numbness, No Cough - SOB.  No Known Allergies Past Medical History  Diagnosis Date  . Hypertension   . Hyperlipidemia   . GERD (gastroesophageal reflux disease)   . Osteoarthritis    Current Outpatient Prescriptions on File Prior to Visit  Medication Sig Dispense Refill  . amLODipine (NORVASC) 10 MG tablet Take 10 mg by mouth every evening.       Marland Kitchen atorvastatin (LIPITOR) 40 MG tablet Take 1 tablet (40 mg total) by mouth daily.  90 tablet  3  . cholecalciferol (VITAMIN D) 1000 UNITS tablet Take 1,000 Units by mouth every evening.      Marland Kitchen co-enzyme Q-10 50 MG capsule Take 100 mg by mouth daily after lunch.      . cyclobenzaprine (FLEXERIL) 10 MG tablet Take 1 tablet (10 mg total) by mouth at bedtime.  30 tablet  1  . fluticasone (FLONASE) 50 MCG/ACT nasal spray Place 2 sprays into both nostrils daily.  16 g  6  . losartan (COZAAR) 25 MG tablet TAKE 1 TABLET BY MOUTH DAILY.  30 tablet  1  . Omega-3 Fatty Acids (FISH OIL) 1000 MG CAPS Take 1,000 mg by mouth every evening.      Marland Kitchen omeprazole (PRILOSEC) 20 MG capsule Take 1 capsule (20 mg total) by mouth every morning.  90 capsule  0  .  polyethylene glycol powder (MIRALAX) powder Take 1 Container by mouth once. As directed for colonoscopy prep      . Bisacodyl (DULCOLAX PO) Take by mouth as directed. As directed for colonoscopy prep      . chlorpheniramine-HYDROcodone (TUSSIONEX PENNKINETIC ER) 10-8 MG/5ML LQCR Take 5 mLs by mouth every 12 (twelve) hours as needed for cough.  120 mL  0   No current facility-administered medications on file prior to visit.   Family History  Problem Relation Age of Onset  . Colon cancer Neg Hx    History   Social History  . Marital Status: Single    Spouse Name: N/A    Number of Children: N/A  . Years of Education: N/A   Occupational History  . Not on file.   Social History Main Topics  . Smoking status: Never Smoker   . Smokeless tobacco: Never Used  . Alcohol Use: No  . Drug Use: No  . Sexual Activity: Not Currently   Other Topics Concern  . Not on file   Social History Narrative  . No narrative on file    Review of Systems: Constitutional: Negative for fever, chills, diaphoresis, activity change, appetite change and fatigue. HENT: Negative for ear pain, nosebleeds, congestion, facial swelling, rhinorrhea, neck pain,  neck stiffness and ear discharge.  Eyes: Negative for pain, discharge, redness, itching and visual disturbance. Respiratory: Negative for cough, choking, chest tightness, shortness of breath, wheezing and stridor.  Cardiovascular: Negative for chest pain, palpitations and leg swelling. Gastrointestinal: Negative for abdominal distention. Genitourinary: Negative for dysuria, urgency, frequency, hematuria, flank pain, decreased urine volume, difficulty urinating and dyspareunia.  Musculoskeletal: Negative for back pain, joint swelling, arthralgia and gait problem. Neurological: Negative for dizziness, tremors, seizures, syncope, facial asymmetry, speech difficulty, weakness, light-headedness, numbness and headaches.  Hematological: Negative for adenopathy.  Does not bruise/bleed easily. Psychiatric/Behavioral: Negative for hallucinations, behavioral problems, confusion, dysphoric mood, decreased concentration and agitation.    Objective:   Filed Vitals:   02/17/14 0954  BP: 137/52  Pulse: 79  Temp: 98.4 F (36.9 C)  Resp: 18    Physical Exam: Constitutional: Obese female sitting comfortably not in acute distress. HENT: Normocephalic, atraumatic, External right and left ear normal. Oropharynx is clear and moist.  Eyes: Conjunctivae and EOM are normal. PERRLA, no scleral icterus. Neck: Normal ROM. Neck supple. No JVD. No tracheal deviation. No thyromegaly. CVS: RRR, S1/S2 +, no murmurs, no gallops, no carotid bruit.  Pulmonary: Effort and breath sounds normal, no stridor, rhonchi, wheezes, rales.  Abdominal: Soft. BS +, no distension, tenderness, rebound or guarding.  Musculoskeletal: Normal range of motion. No edema and no tenderness.  Neuro: Alert. Normal reflexes, muscle tone coordination. No cranial nerve deficit. Skin: Skin is warm and dry. No rash noted. Not diaphoretic. No erythema. No pallor. Psychiatric: Normal mood and affect. Behavior, judgment, thought content normal.  Lab Results  Component Value Date   WBC 6.1 07/17/2013   HGB 13.7 07/17/2013   HCT 40.4 07/17/2013   MCV 89.8 07/17/2013   PLT 383 07/17/2013   Lab Results  Component Value Date   CREATININE 0.88 11/12/2013   BUN 11 11/12/2013   NA 142 11/12/2013   K 4.1 11/12/2013   CL 106 11/12/2013   CO2 29 11/12/2013    No results found for this basename: HGBA1C   Lipid Panel     Component Value Date/Time   CHOL 200 11/26/2013 0913   TRIG 86 11/26/2013 0913   HDL 50 11/26/2013 0913   CHOLHDL 4.0 11/26/2013 0913   VLDL 17 11/26/2013 0913   LDLCALC 133* 11/26/2013 0913       Assessment and plan:   1. Essential hypertension, benign Blood pressure is well-controlled continued current meds will check blood chemistry - COMPLETE METABOLIC PANEL WITH GFR  2. Muscle cramps Will  check blood chemistry, patient takes Flexeril when necessary. - COMPLETE METABOLIC PANEL WITH GFR  3. Screening  - MM DIGITAL SCREENING BILATERAL; Future  4. Hyperlipidemia We'll do fasting lipid panel on the next visit. Continue with Lipitor.      Health Maintenance -Colonoscopy: uptodate   -Mammogram: ordered  -Vaccinations:    -PNA (PPSV23) (one dose after 65) (or one dose before 65 if chronic conditions)   -Influenza  Return in about 3 months (around 05/20/2014) for hypertension.     Doris CheadleADVANI, Nichola Warren, MD

## 2014-02-18 LAB — COMPLETE METABOLIC PANEL WITH GFR
ALBUMIN: 4.2 g/dL (ref 3.5–5.2)
ALK PHOS: 133 U/L — AB (ref 39–117)
ALT: 14 U/L (ref 0–35)
AST: 15 U/L (ref 0–37)
BUN: 11 mg/dL (ref 6–23)
CO2: 29 mEq/L (ref 19–32)
Calcium: 10.1 mg/dL (ref 8.4–10.5)
Chloride: 104 mEq/L (ref 96–112)
Creat: 0.84 mg/dL (ref 0.50–1.10)
GFR, Est African American: 87 mL/min
GFR, Est Non African American: 76 mL/min
Glucose, Bld: 80 mg/dL (ref 70–99)
POTASSIUM: 4.2 meq/L (ref 3.5–5.3)
Sodium: 142 mEq/L (ref 135–145)
Total Bilirubin: 0.5 mg/dL (ref 0.2–1.2)
Total Protein: 7.6 g/dL (ref 6.0–8.3)

## 2014-02-26 ENCOUNTER — Other Ambulatory Visit: Payer: Self-pay

## 2014-05-10 ENCOUNTER — Ambulatory Visit (HOSPITAL_COMMUNITY)
Admission: RE | Admit: 2014-05-10 | Discharge: 2014-05-10 | Disposition: A | Discharge status: Home / Self Care | Payer: No Typology Code available for payment source | Source: Ambulatory Visit | Attending: Internal Medicine | Admitting: Internal Medicine

## 2014-05-10 ENCOUNTER — Telehealth: Payer: Self-pay | Admitting: Family Medicine

## 2014-05-10 DIAGNOSIS — Z139 Encounter for screening, unspecified: Secondary | ICD-10-CM

## 2014-05-10 DIAGNOSIS — K219 Gastro-esophageal reflux disease without esophagitis: Secondary | ICD-10-CM

## 2014-05-10 DIAGNOSIS — I10 Essential (primary) hypertension: Secondary | ICD-10-CM

## 2014-05-10 MED ORDER — LOSARTAN POTASSIUM 25 MG PO TABS: 25.0000 mg | ORAL_TABLET | Freq: Every day | ORAL | Status: DC

## 2014-05-10 MED ORDER — AMLODIPINE BESYLATE 10 MG PO TABS: 10.0000 mg | ORAL_TABLET | Freq: Every evening | ORAL | Status: DC

## 2014-05-10 MED ORDER — OMEPRAZOLE 20 MG PO CPDR: 20.0000 mg | DELAYED_RELEASE_CAPSULE | Freq: Every morning | ORAL | Status: DC

## 2014-05-10 NOTE — Telephone Encounter (Signed)
Patient called into clinical area with the following request: 1. Refill protonix, previously getting it via [facility] for free. [facility] no longer covering. Will to pay the $4 for the month supply.  2. Refill BP meds: norvasc and losartan. Taking and tolerating. Due for f/u appt in mid January.   I sent refills for all meds, year supply for protonix, 2 month supply for BP meds to get patient to f/u appt.

## 2014-05-24 ENCOUNTER — Other Ambulatory Visit: Payer: No Typology Code available for payment source

## 2014-05-27 ENCOUNTER — Other Ambulatory Visit: Payer: No Typology Code available for payment source

## 2014-06-14 ENCOUNTER — Ambulatory Visit: Payer: No Typology Code available for payment source | Attending: Internal Medicine

## 2014-06-14 DIAGNOSIS — E785 Hyperlipidemia, unspecified: Secondary | ICD-10-CM

## 2014-06-14 LAB — LIPID PANEL
CHOL/HDL RATIO: 3.5 ratio
Cholesterol: 181 mg/dL (ref 0–200)
HDL: 52 mg/dL (ref >39)
LDL Cholesterol: 117 mg/dL — ABNORMAL HIGH (ref 0–99)
TRIGLYCERIDES: 60 mg/dL (ref <150)
VLDL: 12 mg/dL (ref 0–40)

## 2014-06-14 LAB — HEMOGLOBIN A1C
Hgb A1c MFr Bld: 5.4 % (ref <5.7)
MEAN PLASMA GLUCOSE: 108 mg/dL (ref <117)

## 2014-06-18 ENCOUNTER — Ambulatory Visit: Payer: No Typology Code available for payment source | Attending: Internal Medicine | Admitting: Internal Medicine

## 2014-06-18 ENCOUNTER — Encounter: Payer: Self-pay | Admitting: Internal Medicine

## 2014-06-18 VITALS — BP 150/80 | HR 82 | Temp 98.0°F | Resp 16 | Wt 206.4 lb

## 2014-06-18 DIAGNOSIS — I10 Essential (primary) hypertension: Secondary | ICD-10-CM | POA: Insufficient documentation

## 2014-06-18 DIAGNOSIS — E785 Hyperlipidemia, unspecified: Secondary | ICD-10-CM

## 2014-06-18 DIAGNOSIS — K219 Gastro-esophageal reflux disease without esophagitis: Secondary | ICD-10-CM | POA: Insufficient documentation

## 2014-06-18 DIAGNOSIS — M2560 Stiffness of unspecified joint, not elsewhere classified: Secondary | ICD-10-CM | POA: Insufficient documentation

## 2014-06-18 DIAGNOSIS — Z79899 Other long term (current) drug therapy: Secondary | ICD-10-CM | POA: Insufficient documentation

## 2014-06-18 MED ORDER — LOSARTAN POTASSIUM 50 MG PO TABS: 50.0000 mg | ORAL_TABLET | Freq: Every day | ORAL | Status: DC

## 2014-06-18 NOTE — Progress Notes (Signed)
Patient here for follow up on her HTN Patient does not need any refills at this time

## 2014-06-18 NOTE — Progress Notes (Signed)
MRN: [id number] Name: Kayla Lowe  Sex: female Age: 59 y.o. DOB: 16-Dec-1955  Allergies: Review of patient's allergies indicates no known allergies.  Chief Complaint  Patient presents with  . Follow-up    HPI: Patient is 59 y.o. female who history of hypertension hyperlipidemia comes today for followup, her blood pressure today is borderline elevated, manual blood pressure is 150/80, denies any headache dizziness chest and shortness of breath, currently taking Norvasc 10 mg daily as well as losartan 25 mg daily, she does complain of morning stiffness in her hand joints for a few weeks, she does report family history of rheumatoid arthritis.  Past Medical History  Diagnosis Date  . Hypertension   . Hyperlipidemia   . GERD (gastroesophageal reflux disease)   . Osteoarthritis     Past Surgical History  Procedure Laterality Date  . Abdominal hysterectomy  1987      Medication List       This list is accurate as of: 06/18/14  5:08 PM.  Always use your most recent med list.               amLODipine 10 MG tablet  Commonly known as:  NORVASC  Take 1 tablet (10 mg total) by mouth every evening.     atorvastatin 40 MG tablet  Commonly known as:  LIPITOR  Take 1 tablet (40 mg total) by mouth daily.     chlorpheniramine-HYDROcodone 10-8 MG/5ML Lqcr  Commonly known as:  TUSSIONEX PENNKINETIC ER  Take 5 mLs by mouth every 12 (twelve) hours as needed for cough.     cholecalciferol 1000 UNITS tablet  Commonly known as:  VITAMIN D  Take 1,000 Units by mouth every evening.     co-enzyme Q-10 50 MG capsule  Take 100 mg by mouth daily after lunch.     cyclobenzaprine 10 MG tablet  Commonly known as:  FLEXERIL  Take 1 tablet (10 mg total) by mouth at bedtime.     DULCOLAX PO  Take by mouth as directed. As directed for colonoscopy prep     Fish Oil 1000 MG Caps  Take 1,000 mg by mouth every evening.     fluticasone 50 MCG/ACT nasal spray  Commonly known  as:  FLONASE  Place 2 sprays into both nostrils daily.     losartan 50 MG tablet  Commonly known as:  COZAAR  Take 1 tablet (50 mg total) by mouth daily.     MIRALAX powder  Generic drug:  polyethylene glycol powder  Take 1 Container by mouth once. As directed for colonoscopy prep     omeprazole 20 MG capsule  Commonly known as:  PRILOSEC  Take 1 capsule (20 mg total) by mouth every morning.        Meds ordered this encounter  Medications  . losartan (COZAAR) 50 MG tablet    Sig: Take 1 tablet (50 mg total) by mouth daily.    Dispense:  90 tablet    Refill:  1    Immunization History  Administered Date(s) Administered  . Influenza,inj,Quad PF,36+ Mos 02/17/2014  . Pneumococcal Polysaccharide-23 02/17/2014    Family History  Problem Relation Age of Onset  . Colon cancer Neg Hx     History  Substance Use Topics  . Smoking status: Never Smoker   . Smokeless tobacco: Never Used  . Alcohol Use: No    Review of Systems   As noted in HPI  Filed Vitals:   06/18/14 1704  BP:  150/80  Pulse:   Temp:   Resp:     Physical Exam  Physical Exam  Constitutional: No distress.  Eyes: EOM are normal. Pupils are equal, round, and reactive to light.  Cardiovascular: Normal rate and regular rhythm.   Pulmonary/Chest: Breath sounds normal. No respiratory distress. She has no wheezes. She has no rales.  Musculoskeletal:  Right hand MCP joint swelling     CBC    Component Value Date/Time   WBC 6.1 07/17/2013 1740   RBC 4.50 07/17/2013 1740   HGB 13.7 07/17/2013 1740   HCT 40.4 07/17/2013 1740   PLT 383 07/17/2013 1740   MCV 89.8 07/17/2013 1740   LYMPHSABS 1.8 07/10/2013 0855   MONOABS 0.3 07/10/2013 0855   EOSABS 0.1 07/10/2013 0855   BASOSABS 0.0 07/10/2013 0855    CMP     Component Value Date/Time   NA 142 02/17/2014 1021   K 4.2 02/17/2014 1021   CL 104 02/17/2014 1021   CO2 29 02/17/2014 1021   GLUCOSE 80 02/17/2014 1021   BUN 11 02/17/2014 1021     CREATININE 0.84 02/17/2014 1021   CREATININE 0.99 07/10/2013 0855   CALCIUM 10.1 02/17/2014 1021   PROT 7.6 02/17/2014 1021   ALBUMIN 4.2 02/17/2014 1021   AST 15 02/17/2014 1021   ALT 14 02/17/2014 1021   ALKPHOS 133* 02/17/2014 1021   BILITOT 0.5 02/17/2014 1021   GFRNONAA 76 02/17/2014 1021   GFRNONAA 61* 07/10/2013 0855   GFRAA 87 02/17/2014 1021   GFRAA 71* 07/10/2013 0855    Lab Results  Component Value Date/Time   CHOL 181 06/14/2014 09:23 AM    No components found for: HGA1C  Lab Results  Component Value Date/Time   AST 15 02/17/2014 10:21 AM    Assessment and Plan  Essential hypertension - Plan:advised patient for DASH diet, I have increased the dose of Cozaar, she'll continue with amlodipine  losartan (COZAAR) 50 MG tablet, COMPLETE METABOLIC PANEL WITH GFR  Hyperlipidemia Recent blood work reviewed cholesterol level is improved, continue with Lipitor and low-fat diet.  Morning stiffness of joints - Plan:we'll check Rheumatoid factor, ANA, Uric acid   Health Maintenance -Colonoscopy: uptodate   -Mammogram: uptodate  -Vaccinations:  uptodate with flu shot and penumovax   Return in about 3 months (around 09/16/2014) for hypertension.  Doris Cheadle, MD

## 2014-06-18 NOTE — Patient Instructions (Signed)

## 2014-06-19 LAB — COMPLETE METABOLIC PANEL WITH GFR
ALBUMIN: 4 g/dL (ref 3.5–5.2)
ALT: 14 U/L (ref 0–35)
AST: 15 U/L (ref 0–37)
Alkaline Phosphatase: 133 U/L — ABNORMAL HIGH (ref 39–117)
BILIRUBIN TOTAL: 0.4 mg/dL (ref 0.2–1.2)
BUN: 9 mg/dL (ref 6–23)
CO2: 28 meq/L (ref 19–32)
Calcium: 9.8 mg/dL (ref 8.4–10.5)
Chloride: 103 mEq/L (ref 96–112)
Creat: 0.93 mg/dL (ref 0.50–1.10)
GFR, EST NON AFRICAN AMERICAN: 67 mL/min
GFR, Est African American: 77 mL/min
Glucose, Bld: 94 mg/dL (ref 70–99)
Potassium: 4 mEq/L (ref 3.5–5.3)
Sodium: 140 mEq/L (ref 135–145)
TOTAL PROTEIN: 7.7 g/dL (ref 6.0–8.3)

## 2014-06-19 LAB — URIC ACID: Uric Acid, Serum: 4.7 mg/dL (ref 2.4–7.0)

## 2014-06-19 LAB — RHEUMATOID FACTOR: RHEUMATOID FACTOR: 58 [IU]/mL — AB (ref <=14)

## 2014-06-21 LAB — ANA: Anti Nuclear Antibody(ANA): NEGATIVE

## 2014-06-22 ENCOUNTER — Telehealth: Payer: Self-pay

## 2014-06-22 DIAGNOSIS — M1990 Unspecified osteoarthritis, unspecified site: Secondary | ICD-10-CM

## 2014-06-22 NOTE — Telephone Encounter (Signed)
-----   Message from Doris Cheadleeepak Advani, MD sent at 06/21/2014  5:53 PM EST ----- Blood work reviewed, call and let the patient know that her test for rheumatoid arthritis came back positive,patient needs to have further evaluation by rheumatologist, put in the referral

## 2014-06-22 NOTE — Telephone Encounter (Signed)
Patient is aware of her lab results Referral for rheumatologist placed in epic

## 2014-09-15 ENCOUNTER — Other Ambulatory Visit: Payer: Self-pay | Admitting: Family Medicine

## 2014-09-16 ENCOUNTER — Other Ambulatory Visit: Payer: Self-pay

## 2014-09-16 DIAGNOSIS — E7800 Pure hypercholesterolemia, unspecified: Secondary | ICD-10-CM

## 2014-09-16 MED ORDER — ATORVASTATIN CALCIUM 40 MG PO TABS: 40.0000 mg | ORAL_TABLET | Freq: Every day | ORAL | Status: DC

## 2014-09-23 ENCOUNTER — Ambulatory Visit: Payer: No Typology Code available for payment source | Admitting: Internal Medicine

## 2014-09-24 ENCOUNTER — Ambulatory Visit: Payer: No Typology Code available for payment source | Admitting: Internal Medicine

## 2014-09-29 ENCOUNTER — Ambulatory Visit: Payer: No Typology Code available for payment source | Attending: Internal Medicine | Admitting: Internal Medicine

## 2014-09-29 ENCOUNTER — Encounter: Payer: Self-pay | Admitting: Internal Medicine

## 2014-09-29 VITALS — BP 138/82 | HR 85 | Temp 98.0°F | Resp 16 | Wt 205.6 lb

## 2014-09-29 DIAGNOSIS — I10 Essential (primary) hypertension: Secondary | ICD-10-CM | POA: Insufficient documentation

## 2014-09-29 DIAGNOSIS — R768 Other specified abnormal immunological findings in serum: Secondary | ICD-10-CM | POA: Insufficient documentation

## 2014-09-29 DIAGNOSIS — H538 Other visual disturbances: Secondary | ICD-10-CM | POA: Insufficient documentation

## 2014-09-29 DIAGNOSIS — K219 Gastro-esophageal reflux disease without esophagitis: Secondary | ICD-10-CM | POA: Insufficient documentation

## 2014-09-29 MED ORDER — RANITIDINE HCL 150 MG PO TABS: 150.0000 mg | ORAL_TABLET | Freq: Every day | ORAL | Status: DC

## 2014-09-29 MED ORDER — AMLODIPINE BESYLATE 10 MG PO TABS: 10.0000 mg | ORAL_TABLET | Freq: Every evening | ORAL | Status: DC

## 2014-09-29 MED ORDER — LOSARTAN POTASSIUM 50 MG PO TABS: 50.0000 mg | ORAL_TABLET | Freq: Every day | ORAL | Status: DC

## 2014-09-29 NOTE — Progress Notes (Signed)
Patient here for her routine follow up on her hypertension and for a refill On her amlodopine

## 2014-09-29 NOTE — Progress Notes (Signed)
MRN: [id number] Name: Kayla Lowe  Sex: female Age: 59 y.o. DOB: Aug 11, 1955  Allergies: Review of patient's allergies indicates no known allergies.  Chief Complaint  Patient presents with  . Follow-up    HPI: Patient is 59 y.o. female who has is she of hypertension hyperlipidemia comes today for followup, previous blood work reviewed with the patient her rheumatoid factor was positive, she does report family history of rheumatoid arthritis, she has already been referred to rheumatologist which she is going to schedule appointment, she also history of GERD and was prescribed Prilosec in the past as per patient she is not using that medication and wants to try different medication  Past Medical History  Diagnosis Date  . Hypertension   . Hyperlipidemia   . GERD (gastroesophageal reflux disease)   . Osteoarthritis     Past Surgical History  Procedure Laterality Date  . Abdominal hysterectomy  1987      Medication List       This list is accurate as of: 09/29/14 10:17 AM.  Always use your most recent med list.               amLODipine 10 MG tablet  Commonly known as:  NORVASC  Take 1 tablet (10 mg total) by mouth every evening.     atorvastatin 40 MG tablet  Commonly known as:  LIPITOR  Take 1 tablet (40 mg total) by mouth daily.     chlorpheniramine-HYDROcodone 10-8 MG/5ML Lqcr  Commonly known as:  TUSSIONEX PENNKINETIC ER  Take 5 mLs by mouth every 12 (twelve) hours as needed for cough.     cholecalciferol 1000 UNITS tablet  Commonly known as:  VITAMIN D  Take 1,000 Units by mouth every evening.     co-enzyme Q-10 50 MG capsule  Take 100 mg by mouth daily after lunch.     cyclobenzaprine 10 MG tablet  Commonly known as:  FLEXERIL  Take 1 tablet (10 mg total) by mouth at bedtime.     DULCOLAX PO  Take by mouth as directed. As directed for colonoscopy prep     Fish Oil 1000 MG Caps  Take 1,000 mg by mouth every evening.     fluticasone  50 MCG/ACT nasal spray  Commonly known as:  FLONASE  Place 2 sprays into both nostrils daily.     losartan 50 MG tablet  Commonly known as:  COZAAR  Take 1 tablet (50 mg total) by mouth daily.     MIRALAX powder  Generic drug:  polyethylene glycol powder  Take 1 Container by mouth once. As directed for colonoscopy prep     omeprazole 20 MG capsule  Commonly known as:  PRILOSEC  Take 1 capsule (20 mg total) by mouth every morning.     ranitidine 150 MG tablet  Commonly known as:  ZANTAC  Take 1 tablet (150 mg total) by mouth at bedtime.        Meds ordered this encounter  Medications  . amLODipine (NORVASC) 10 MG tablet    Sig: Take 1 tablet (10 mg total) by mouth every evening.    Dispense:  30 tablet    Refill:  2  . ranitidine (ZANTAC) 150 MG tablet    Sig: Take 1 tablet (150 mg total) by mouth at bedtime.    Dispense:  30 tablet    Refill:  3  . losartan (COZAAR) 50 MG tablet    Sig: Take 1 tablet (50 mg total) by mouth  daily.    Dispense:  90 tablet    Refill:  2    Immunization History  Administered Date(s) Administered  . Influenza,inj,Quad PF,36+ Mos 02/17/2014  . Pneumococcal Polysaccharide-23 02/17/2014    Family History  Problem Relation Age of Onset  . Colon cancer Neg Hx     History  Substance Use Topics  . Smoking status: Never Smoker   . Smokeless tobacco: Never Used  . Alcohol Use: No    Review of Systems   As noted in HPI  Filed Vitals:   09/29/14 0945  BP: 138/82  Pulse: 85  Temp: 98 F (36.7 C)  Resp: 16    Physical Exam  Physical Exam  Constitutional: No distress.  Eyes: EOM are normal. Pupils are equal, round, and reactive to light.  Cardiovascular: Normal rate and regular rhythm.   Pulmonary/Chest: Breath sounds normal. No respiratory distress. She has no wheezes. She has no rales.  Musculoskeletal: She exhibits no edema.    CBC    Component Value Date/Time   WBC 6.1 07/17/2013 1740   RBC 4.50 07/17/2013 1740    HGB 13.7 07/17/2013 1740   HCT 40.4 07/17/2013 1740   PLT 383 07/17/2013 1740   MCV 89.8 07/17/2013 1740   LYMPHSABS 1.8 07/10/2013 0855   MONOABS 0.3 07/10/2013 0855   EOSABS 0.1 07/10/2013 0855   BASOSABS 0.0 07/10/2013 0855    CMP     Component Value Date/Time   NA 140 06/18/2014 1714   K 4.0 06/18/2014 1714   CL 103 06/18/2014 1714   CO2 28 06/18/2014 1714   GLUCOSE 94 06/18/2014 1714   BUN 9 06/18/2014 1714   CREATININE 0.93 06/18/2014 1714   CREATININE 0.99 07/10/2013 0855   CALCIUM 9.8 06/18/2014 1714   PROT 7.7 06/18/2014 1714   ALBUMIN 4.0 06/18/2014 1714   AST 15 06/18/2014 1714   ALT 14 06/18/2014 1714   ALKPHOS 133* 06/18/2014 1714   BILITOT 0.4 06/18/2014 1714   GFRNONAA 67 06/18/2014 1714   GFRNONAA 61* 07/10/2013 0855   GFRAA 77 06/18/2014 1714   GFRAA 71* 07/10/2013 0855    Lab Results  Component Value Date/Time   CHOL 181 06/14/2014 09:23 AM    Lab Results  Component Value Date/Time   HGBA1C 5.4 06/14/2014 09:23 AM    Lab Results  Component Value Date/Time   AST 15 06/18/2014 05:14 PM    Assessment and Plan  Essential hypertension - Plan: advised patient for DASH diet continue with current meds  amLODipine (NORVASC) 10 MG tablet, losartan (COZAAR) 50 MG tablet, repeat blood chemistry for visit  Gastroesophageal reflux disease, esophagitis presence not specified - Plan: list of modification, trial of ranitidine (ZANTAC) 150 MG tablet  Rheumatoid factor positive Patient has been referred to rheumatologist.  Kennis CarinaBlurry vision - Plan: Ambulatory referral to Ophthalmology   Health Maintenance -Colonoscopy:Up-to-date  -Mammogram:up-to-date   Return in about 3 months (around 12/30/2014), or if symptoms worsen or fail to improve, for hypertension.   This note has been created with Education officer, environmentalDragon speech recognition software and smart phrase technology. Any transcriptional errors are unintentional.    Doris CheadleADVANI, Ephriam Turman, MD

## 2014-12-28 ENCOUNTER — Ambulatory Visit: Payer: No Typology Code available for payment source | Admitting: Internal Medicine

## 2015-01-03 ENCOUNTER — Other Ambulatory Visit: Payer: Self-pay | Admitting: Internal Medicine

## 2015-01-19 ENCOUNTER — Encounter: Payer: Self-pay | Admitting: Internal Medicine

## 2015-01-19 ENCOUNTER — Ambulatory Visit: Payer: No Typology Code available for payment source | Attending: Internal Medicine | Admitting: Internal Medicine

## 2015-01-19 DIAGNOSIS — I10 Essential (primary) hypertension: Secondary | ICD-10-CM | POA: Insufficient documentation

## 2015-01-19 DIAGNOSIS — E7800 Pure hypercholesterolemia, unspecified: Secondary | ICD-10-CM

## 2015-01-19 DIAGNOSIS — K219 Gastro-esophageal reflux disease without esophagitis: Secondary | ICD-10-CM | POA: Insufficient documentation

## 2015-01-19 DIAGNOSIS — J3089 Other allergic rhinitis: Secondary | ICD-10-CM

## 2015-01-19 LAB — BASIC METABOLIC PANEL
BUN: 10 mg/dL (ref 7–25)
CALCIUM: 10 mg/dL (ref 8.6–10.4)
CHLORIDE: 105 mmol/L (ref 98–110)
CO2: 26 mmol/L (ref 20–31)
Creat: 0.8 mg/dL (ref 0.50–0.99)
GLUCOSE: 84 mg/dL (ref 65–99)
POTASSIUM: 4.1 mmol/L (ref 3.5–5.3)
SODIUM: 141 mmol/L (ref 135–146)

## 2015-01-19 MED ORDER — ATORVASTATIN CALCIUM 40 MG PO TABS: 40.0000 mg | ORAL_TABLET | Freq: Every day | ORAL | Status: DC

## 2015-01-19 MED ORDER — LOSARTAN POTASSIUM 50 MG PO TABS: 50.0000 mg | ORAL_TABLET | Freq: Every day | ORAL | Status: DC

## 2015-01-19 MED ORDER — FLUTICASONE PROPIONATE 50 MCG/ACT NA SUSP: 2.0000 | Freq: Every day | NASAL | Status: DC

## 2015-01-19 MED ORDER — AMLODIPINE BESYLATE 10 MG PO TABS: 10.0000 mg | ORAL_TABLET | Freq: Every evening | ORAL | Status: DC

## 2015-01-19 MED ORDER — RANITIDINE HCL 150 MG PO TABS: 150.0000 mg | ORAL_TABLET | Freq: Every day | ORAL | Status: DC

## 2015-01-19 NOTE — Progress Notes (Signed)
Patient here for follow up on her hypertension and cholesterol Patient complains of having some dry skin and bilateral feet sweeling Patient also requesting refills on her medications

## 2015-01-19 NOTE — Progress Notes (Signed)
Patient ID: Kayla Lowe, female   DOB: Sep 29, 1955, 59 y.o.   MRN: [id number]  CC: Hypertension follow-up  HPI: Kayla Lowe is a 59 y.o. female here today for a follow up visit for hypertension.  Patient has past medical history of HTN, HLD, GERD, and OA.  Patient reports that she takes her blood pressure medication daily without any complications. Patient denies symptoms of headaches, blurred vision, chest pain, palpitation, shortness of breath. Patient does note that she's been having some swelling of her bilateral lower ankles while standing and working. Patient admits to consuming a high sodium content diet. Patient has no other concerns today.  No Known Allergies Past Medical History  Diagnosis Date  . Hypertension   . Hyperlipidemia   . GERD (gastroesophageal reflux disease)   . Osteoarthritis    Current Outpatient Prescriptions on File Prior to Visit  Medication Sig Dispense Refill  . amLODipine (NORVASC) 10 MG tablet Take 1 tablet (10 mg total) by mouth every evening. 30 tablet 2  . atorvastatin (LIPITOR) 40 MG tablet Take 1 tablet (40 mg total) by mouth daily. 90 tablet 3  . Bisacodyl (DULCOLAX PO) Take by mouth as directed. As directed for colonoscopy prep    . chlorpheniramine-HYDROcodone (TUSSIONEX PENNKINETIC ER) 10-8 MG/5ML LQCR Take 5 mLs by mouth every 12 (twelve) hours as needed for cough. 120 mL 0  . cholecalciferol (VITAMIN D) 1000 UNITS tablet Take 1,000 Units by mouth every evening.    Marland Kitchen co-enzyme Q-10 50 MG capsule Take 100 mg by mouth daily after lunch.    . cyclobenzaprine (FLEXERIL) 10 MG tablet Take 1 tablet (10 mg total) by mouth at bedtime. 30 tablet 1  . fluticasone (FLONASE) 50 MCG/ACT nasal spray Place 2 sprays into both nostrils daily. 16 g 6  . losartan (COZAAR) 50 MG tablet Take 1 tablet (50 mg total) by mouth daily. 90 tablet 2  . Omega-3 Fatty Acids (FISH OIL) 1000 MG CAPS Take 1,000 mg by mouth every evening.    .  polyethylene glycol powder (MIRALAX) powder Take 1 Container by mouth once. As directed for colonoscopy prep    . ranitidine (ZANTAC) 150 MG tablet Take 1 tablet (150 mg total) by mouth at bedtime. 30 tablet 3  . omeprazole (PRILOSEC) 20 MG capsule Take 1 capsule (20 mg total) by mouth every morning. (Patient not taking: Reported on 01/19/2015) 30 capsule 11   No current facility-administered medications on file prior to visit.   Family History  Problem Relation Age of Onset  . Colon cancer Neg Hx    Social History   Social History  . Marital Status: Legally Separated    Spouse Name: N/A  . Number of Children: N/A  . Years of Education: N/A   Occupational History  . Not on file.   Social History Main Topics  . Smoking status: Never Smoker   . Smokeless tobacco: Never Used  . Alcohol Use: No  . Drug Use: No  . Sexual Activity: Not Currently   Other Topics Concern  . Not on file   Social History Narrative    Review of Systems: Other than what is stated in HPI, all other systems are negative.   Objective:   Filed Vitals:   01/19/15 1715  BP: 128/86  Pulse: 73  Temp: 98.8 F (37.1 C)  Resp: 16    Physical Exam  Constitutional: She is oriented to person, place, and time.  Neck: No JVD present.  Cardiovascular: Normal rate, regular rhythm  and normal heart sounds.   Pulmonary/Chest: Effort normal and breath sounds normal.  Musculoskeletal: She exhibits edema (Trace).  Neurological: She is alert and oriented to person, place, and time.  Skin: Skin is warm and dry.     Lab Results  Component Value Date   WBC 6.1 07/17/2013   HGB 13.7 07/17/2013   HCT 40.4 07/17/2013   MCV 89.8 07/17/2013   PLT 383 07/17/2013   Lab Results  Component Value Date   CREATININE 0.93 06/18/2014   BUN 9 06/18/2014   NA 140 06/18/2014   K 4.0 06/18/2014   CL 103 06/18/2014   CO2 28 06/18/2014    Lab Results  Component Value Date   HGBA1C 5.4 06/14/2014   Lipid Panel      Component Value Date/Time   CHOL 181 06/14/2014 0923   TRIG 60 06/14/2014 0923   HDL 52 06/14/2014 0923   CHOLHDL 3.5 06/14/2014 0923   VLDL 12 06/14/2014 0923   LDLCALC 117* 06/14/2014 0923       Assessment and plan:   Kayla Lowe was seen today for follow-up.  Diagnoses and all orders for this visit:  Essential hypertension Meds refilled -     amLODipine (NORVASC) 10 MG tablet; Take 1 tablet (10 mg total) by mouth every evening. -     losartan (COZAAR) 50 MG tablet; Take 1 tablet (50 mg total) by mouth daily. -     Basic Metabolic Panel Patient blood pressure is stable and may continue on current medication.  Education on diet, exercise, and modifiable risk factors discussed. Will obtain appropriate labs as needed. Will follow up in 3-6 months. DASH diet advised to prevent edema  High cholesterol -     Refill atorvastatin (LIPITOR) 40 MG tablet; Take 1 tablet (40 mg total) by mouth daily. Patient's last lipid panel was drawn February 2016 LDL was not at goal level of 117. Education provided on proper lifestyle changes in order to lower cholesterol. Patient advised to maintain healthy weight and to keep total fat intake at 25-35% of total calories and carbohydrates 50-60% of total daily calories. Explained how high cholesterol places patient at risk for heart disease. Patient placed on appropriate medication and repeat labs in 6 months   Gastroesophageal reflux disease, esophagitis presence not specified -    Refill ranitidine (ZANTAC) 150 MG tablet; Take 1 tablet (150 mg total) by mouth at bedtime. Discussed diet and weight with patient relating to acid reflux.  Went over things that may exacerbate acid reflux such as tomatoes, spicy foods, coffee, carbonated beverages, chocolates, etc.  Advised patient to avoid laying down at least two hours after meals and sleep with HOB elevated.   Other allergic rhinitis -    Refill fluticasone (FLONASE) 50 MCG/ACT nasal spray; Place 2 sprays  into both nostrils daily. Stable  Return in about 3 months (around 04/20/2015) for Hypertension.       Ambrose Finland, NP-C [facility] [phone] 01/19/2015, 5:21 PM

## 2015-01-19 NOTE — Patient Instructions (Signed)
DASH Eating Plan °DASH stands for "Dietary Approaches to Stop Hypertension." The DASH eating plan is a healthy eating plan that has been shown to reduce high blood pressure (hypertension). Additional health benefits may include reducing the risk of type 2 diabetes mellitus, heart disease, and stroke. The DASH eating plan may also help with weight loss. °WHAT DO I NEED TO KNOW ABOUT THE DASH EATING PLAN? °For the DASH eating plan, you will follow these general guidelines: °· Choose foods with a percent daily value for sodium of less than 5% (as listed on the food label). °· Use salt-free seasonings or herbs instead of table salt or sea salt. °· Check with your health care provider or pharmacist before using salt substitutes. °· Eat lower-sodium products, often labeled as "lower sodium" or "no salt added." °· Eat fresh foods. °· Eat more vegetables, fruits, and low-fat dairy products. °· Choose whole grains. Look for the word "whole" as the first word in the ingredient list. °· Choose fish and skinless chicken or turkey more often than red meat. Limit fish, poultry, and meat to 6 oz (170 g) each day. °· Limit sweets, desserts, sugars, and sugary drinks. °· Choose heart-healthy fats. °· Limit cheese to 1 oz (28 g) per day. °· Eat more home-cooked food and less restaurant, buffet, and fast food. °· Limit fried foods. °· Cook foods using methods other than frying. °· Limit canned vegetables. If you do use them, rinse them well to decrease the sodium. °· When eating at a restaurant, ask that your food be prepared with less salt, or no salt if possible. °WHAT FOODS CAN I EAT? °Seek help from a dietitian for individual calorie needs. °Grains °Whole grain or whole wheat bread. Brown rice. Whole grain or whole wheat pasta. Quinoa, bulgur, and whole grain cereals. Low-sodium cereals. Corn or whole wheat flour tortillas. Whole grain cornbread. Whole grain crackers. Low-sodium crackers. °Vegetables °Fresh or frozen vegetables  (raw, steamed, roasted, or grilled). Low-sodium or reduced-sodium tomato and vegetable juices. Low-sodium or reduced-sodium tomato sauce and paste. Low-sodium or reduced-sodium canned vegetables.  °Fruits °All fresh, canned (in natural juice), or frozen fruits. °Meat and Other Protein Products °Ground beef (85% or leaner), grass-fed beef, or beef trimmed of fat. Skinless chicken or turkey. Ground chicken or turkey. Pork trimmed of fat. All fish and seafood. Eggs. Dried beans, peas, or lentils. Unsalted nuts and seeds. Unsalted canned beans. °Dairy °Low-fat dairy products, such as skim or 1% milk, 2% or reduced-fat cheeses, low-fat ricotta or cottage cheese, or plain low-fat yogurt. Low-sodium or reduced-sodium cheeses. °Fats and Oils °Tub margarines without trans fats. Light or reduced-fat mayonnaise and salad dressings (reduced sodium). Avocado. Safflower, olive, or canola oils. Natural peanut or almond butter. °Other °Unsalted popcorn and pretzels. °The items listed above may not be a complete list of recommended foods or beverages. Contact your dietitian for more options. °WHAT FOODS ARE NOT RECOMMENDED? °Grains °White bread. White pasta. White rice. Refined cornbread. Bagels and croissants. Crackers that contain trans fat. °Vegetables °Creamed or fried vegetables. Vegetables in a cheese sauce. Regular canned vegetables. Regular canned tomato sauce and paste. Regular tomato and vegetable juices. °Fruits °Dried fruits. Canned fruit in light or heavy syrup. Fruit juice. °Meat and Other Protein Products °Fatty cuts of meat. Ribs, chicken wings, bacon, sausage, bologna, salami, chitterlings, fatback, hot dogs, bratwurst, and packaged luncheon meats. Salted nuts and seeds. Canned beans with salt. °Dairy °Whole or 2% milk, cream, half-and-half, and cream cheese. Whole-fat or sweetened yogurt. Full-fat   cheeses or blue cheese. Nondairy creamers and whipped toppings. Processed cheese, cheese spreads, or cheese  curds. °Condiments °Onion and garlic salt, seasoned salt, table salt, and sea salt. Canned and packaged gravies. Worcestershire sauce. Tartar sauce. Barbecue sauce. Teriyaki sauce. Soy sauce, including reduced sodium. Steak sauce. Fish sauce. Oyster sauce. Cocktail sauce. Horseradish. Ketchup and mustard. Meat flavorings and tenderizers. Bouillon cubes. Hot sauce. Tabasco sauce. Marinades. Taco seasonings. Relishes. °Fats and Oils °Butter, stick margarine, lard, shortening, ghee, and bacon fat. Coconut, palm kernel, or palm oils. Regular salad dressings. °Other °Pickles and olives. Salted popcorn and pretzels. °The items listed above may not be a complete list of foods and beverages to avoid. Contact your dietitian for more information. °WHERE CAN I FIND MORE INFORMATION? °[employer], [employer], [employer]: www.nhlbi.nih.gov/health/health-topics/topics/dash/ °Document Released: 04/19/2011 Document Revised: 09/14/2013 Document Reviewed: 03/04/2013 °Exit[employer]® Patient Information ©2015 [employer]. This information is not intended to replace advice given to you by your health care provider. Make sure you discuss any questions you have with your health care provider. ° °

## 2015-01-25 ENCOUNTER — Telehealth: Payer: Self-pay

## 2015-01-25 NOTE — Telephone Encounter (Signed)
Spoke with patient and she is aware of her normal lab results 

## 2015-01-25 NOTE — Telephone Encounter (Signed)
-----   Message from Valerie A Keck, NP sent at 01/25/2015 10:20 AM EDT ----- Labs are within normal limits  

## 2015-03-24 ENCOUNTER — Ambulatory Visit: Payer: No Typology Code available for payment source | Attending: Internal Medicine | Admitting: Pharmacist

## 2015-03-24 DIAGNOSIS — Z23 Encounter for immunization: Secondary | ICD-10-CM | POA: Insufficient documentation

## 2015-03-24 MED ORDER — INFLUENZA VAC SPLIT QUAD 0.5 ML IM SUSY
0.5000 mL | PREFILLED_SYRINGE | Freq: Once | INTRAMUSCULAR | Status: AC
  Administered 2015-03-24: 0.5 mL via INTRAMUSCULAR

## 2015-03-24 NOTE — Patient Instructions (Signed)

## 2015-05-03 ENCOUNTER — Ambulatory Visit: Payer: No Typology Code available for payment source | Attending: Internal Medicine | Admitting: Internal Medicine

## 2015-05-03 ENCOUNTER — Encounter: Payer: Self-pay | Admitting: Internal Medicine

## 2015-05-03 VITALS — BP 139/77 | HR 88 | Temp 98.9°F | Resp 16 | Ht 65.0 in | Wt 213.0 lb

## 2015-05-03 DIAGNOSIS — I10 Essential (primary) hypertension: Secondary | ICD-10-CM | POA: Insufficient documentation

## 2015-05-03 DIAGNOSIS — Z79899 Other long term (current) drug therapy: Secondary | ICD-10-CM | POA: Insufficient documentation

## 2015-05-03 DIAGNOSIS — E785 Hyperlipidemia, unspecified: Secondary | ICD-10-CM

## 2015-05-03 DIAGNOSIS — R0981 Nasal congestion: Secondary | ICD-10-CM

## 2015-05-03 MED ORDER — AMLODIPINE BESYLATE 10 MG PO TABS: 10.0000 mg | ORAL_TABLET | Freq: Every evening | ORAL | Status: DC

## 2015-05-03 MED ORDER — LOSARTAN POTASSIUM 50 MG PO TABS: 50.0000 mg | ORAL_TABLET | Freq: Every day | ORAL | Status: DC

## 2015-05-03 MED ORDER — LORATADINE 10 MG PO TABS: 10.0000 mg | ORAL_TABLET | Freq: Every day | ORAL | Status: DC

## 2015-05-03 MED ORDER — ATORVASTATIN CALCIUM 40 MG PO TABS: 40.0000 mg | ORAL_TABLET | Freq: Every day | ORAL | Status: DC

## 2015-05-03 NOTE — Progress Notes (Signed)
Patient here for follow up on her HTN Complains of having some sinus pressure and a lot Of facial congestion Patient stated she now takes her medications in the am

## 2015-05-03 NOTE — Progress Notes (Signed)
Patient ID: Kayla Lowe, female   DOB: [birth year], 59 y.o.   MRN: [id number] Subjective:  Kayla Lowe is a 59 y.o. female with hypertension and seasonal allergies. Patient reports that she takes her blood pressure medication daily without complications. Patient is concerned about sinus congestion for one month. She states that for the past 2 weeks she has been using her flonase and saline rinses. She denies fever, chills, cough, headaches, or rhinitis.  Current Outpatient Prescriptions  Medication Sig Dispense Refill  . amLODipine (NORVASC) 10 MG tablet Take 1 tablet (10 mg total) by mouth every evening. 30 tablet 5  . atorvastatin (LIPITOR) 40 MG tablet Take 1 tablet (40 mg total) by mouth daily. 90 tablet 5  . fluticasone (FLONASE) 50 MCG/ACT nasal spray Place 2 sprays into both nostrils daily. 16 g 6  . losartan (COZAAR) 50 MG tablet Take 1 tablet (50 mg total) by mouth daily. 30 tablet 5  . ranitidine (ZANTAC) 150 MG tablet Take 1 tablet (150 mg total) by mouth at bedtime. 30 tablet 5  . Bisacodyl (DULCOLAX PO) Take by mouth as directed. As directed for colonoscopy prep    . chlorpheniramine-HYDROcodone (TUSSIONEX PENNKINETIC ER) 10-8 MG/5ML LQCR Take 5 mLs by mouth every 12 (twelve) hours as needed for cough. 120 mL 0  . cholecalciferol (VITAMIN D) 1000 UNITS tablet Take 1,000 Units by mouth every evening.    Marland Kitchen. co-enzyme Q-10 50 MG capsule Take 100 mg by mouth daily after lunch.    . cyclobenzaprine (FLEXERIL) 10 MG tablet Take 1 tablet (10 mg total) by mouth at bedtime. 30 tablet 1  . Omega-3 Fatty Acids (FISH OIL) 1000 MG CAPS Take 1,000 mg by mouth every evening.    Marland Kitchen. omeprazole (PRILOSEC) 20 MG capsule Take 1 capsule (20 mg total) by mouth every morning. (Patient not taking: Reported on 01/19/2015) 30 capsule 11  . polyethylene glycol powder (MIRALAX) powder Take 1 Container by mouth once. As directed for colonoscopy prep     No current facility-administered  medications for this visit.    Hypertension ROS: taking medications as instructed, no medication side effects noted, no TIA's, no chest pain on exertion, no dyspnea on exertion and no swelling of ankles.    Objective:  BP 157/81 mmHg  Pulse 88  Temp(Src) 98.9 F (37.2 C)  Resp 16  Ht 5\' 5"  (1.651 m)  Wt 213 lb (96.616 kg)  BMI 35.44 kg/m2  SpO2 99%  Appearance alert, well appearing, and in no distress, oriented to person, place, and time and overweight. General exam BP noted to be well controlled today in office, S1, S2 normal, no gallop, no murmur, chest clear, no JVD, no HSM, no edema. Right swollen nasal turbinate. Normal TM's bilaterally and normal oropharynx. No cervical adenopathy Lab review: labs are reviewed, up to date and normal. I will recheck Bmet and Lipid on next visit  Assessment:   Kayla Lowe was seen today for follow-up.  Diagnoses and all orders for this visit:  Essential hypertension -     losartan (COZAAR) 50 MG tablet; Take 1 tablet (50 mg total) by mouth daily. -     amLODipine (NORVASC) 10 MG tablet; Take 1 tablet (10 mg total) by mouth every evening. Patient blood pressure is stable and may continue on current medication.  Education on diet, exercise, and modifiable risk factors discussed. Will obtain appropriate labs as needed. Will follow up in 3-6 months.   HLD (hyperlipidemia) -     atorvastatin (LIPITOR) 40  MG tablet; Take 1 tablet (40 mg total) by mouth daily. Education provided on proper lifestyle changes in order to lower cholesterol. Patient advised to maintain healthy weight and to keep total fat intake at 25-35% of total calories and carbohydrates 50-60% of total daily calories. Explained how high cholesterol places patient at risk for heart disease. Patient placed on appropriate medication and repeat labs in 6 months   Nasal congestion -     loratadine (CLARITIN) 10 MG tablet; Take 1 tablet (10 mg total) by mouth daily.   Plan:  Current  treatment plan is effective, no change in therapy. Reviewed diet, exercise and weight control. Recommended sodium restriction..   Return in about 3 months (around 08/01/2015).  Ambrose Finland, NP 05/03/2015 10:37 AM

## 2015-05-24 MED FILL — ?ATORVASTATIN 40MG TABLET: 40 | 30 days supply | Qty: 30 | Fill #4

## 2015-05-24 MED FILL — AMLODIPINE BESYLATE 10 MG T: 10 | 30 days supply | Qty: 30 | Fill #4

## 2015-05-24 MED FILL — raNITIdine HCL 150 MG TABS: 150 | 30 days supply | Qty: 30 | Fill #4

## 2015-06-10 MED FILL — LOSARTAN POTASSIUM 50 MG TA: 50 | 30 days supply | Qty: 30 | Fill #8

## 2015-06-21 MED FILL — raNITIdine HCL 150 MG TABS: 150 | 30 days supply | Qty: 30 | Fill #5

## 2015-06-21 MED FILL — ?AMLODIPINE BESYLATE 10 MG: 10 | 30 days supply | Qty: 30 | Fill #5

## 2015-06-21 MED FILL — ATORVASTATIN 40 MG TABLET: 40 | 30 days supply | Qty: 30 | Fill #5

## 2015-07-07 MED FILL — LOSARTAN POTASSIUM 50 MG TA: 50 | 30 days supply | Qty: 30 | Fill #0

## 2015-07-19 MED FILL — ?AMLODIPINE BESYLATE 10 MG: 10 | 30 days supply | Qty: 30 | Fill #0

## 2015-07-19 MED FILL — ?ATORVASTATIN 40MG TABLET: 40 | 30 days supply | Qty: 30 | Fill #6

## 2015-07-20 MED FILL — raNITIdine HCL 150 MG TABS: 150 | 30 days supply | Qty: 30 | Fill #2

## 2015-08-03 ENCOUNTER — Encounter: Payer: Self-pay | Admitting: Internal Medicine

## 2015-08-03 ENCOUNTER — Ambulatory Visit: Payer: No Typology Code available for payment source | Attending: Internal Medicine | Admitting: Internal Medicine

## 2015-08-03 VITALS — BP 135/79 | HR 63 | Temp 98.0°F | Resp 16 | Ht 65.0 in | Wt 212.0 lb

## 2015-08-03 DIAGNOSIS — K219 Gastro-esophageal reflux disease without esophagitis: Secondary | ICD-10-CM

## 2015-08-03 DIAGNOSIS — E785 Hyperlipidemia, unspecified: Secondary | ICD-10-CM | POA: Insufficient documentation

## 2015-08-03 DIAGNOSIS — Z23 Encounter for immunization: Secondary | ICD-10-CM | POA: Insufficient documentation

## 2015-08-03 DIAGNOSIS — M62830 Muscle spasm of back: Secondary | ICD-10-CM

## 2015-08-03 DIAGNOSIS — I10 Essential (primary) hypertension: Secondary | ICD-10-CM | POA: Insufficient documentation

## 2015-08-03 LAB — LIPID PANEL
CHOL/HDL RATIO: 4.4 ratio (ref <=5.0)
CHOLESTEROL: 237 mg/dL — AB (ref 125–200)
HDL: 54 mg/dL (ref >=46)
LDL Cholesterol: 162 mg/dL — ABNORMAL HIGH (ref <130)
Triglycerides: 105 mg/dL (ref <150)
VLDL: 21 mg/dL (ref <30)

## 2015-08-03 LAB — BASIC METABOLIC PANEL
BUN: 9 mg/dL (ref 7–25)
CHLORIDE: 107 mmol/L (ref 98–110)
CO2: 23 mmol/L (ref 20–31)
Calcium: 10.1 mg/dL (ref 8.6–10.4)
Creat: 0.87 mg/dL (ref 0.50–0.99)
GLUCOSE: 90 mg/dL (ref 65–99)
POTASSIUM: 4.2 mmol/L (ref 3.5–5.3)
SODIUM: 143 mmol/L (ref 135–146)

## 2015-08-03 MED ORDER — ATORVASTATIN CALCIUM 40 MG PO TABS: 40.0000 mg | ORAL_TABLET | Freq: Every day | ORAL | Status: DC

## 2015-08-03 MED ORDER — CYCLOBENZAPRINE HCL 10 MG PO TABS: 10.0000 mg | ORAL_TABLET | Freq: Every day | ORAL | Status: DC

## 2015-08-03 MED ORDER — AMLODIPINE BESYLATE 10 MG PO TABS
10.0000 mg | ORAL_TABLET | Freq: Every evening | ORAL | Status: DC
Start: 2015-08-03 — End: 2016-02-06

## 2015-08-03 MED ORDER — RANITIDINE HCL 150 MG PO TABS: 150.0000 mg | ORAL_TABLET | Freq: Every day | ORAL | Status: DC

## 2015-08-03 MED ORDER — LOSARTAN POTASSIUM 50 MG PO TABS: 50.0000 mg | ORAL_TABLET | Freq: Every day | ORAL | Status: DC

## 2015-08-03 MED FILL — AMLODIPINE BESYLATE 10 MG T: 10 | 30 days supply | Qty: 30 | Fill #0

## 2015-08-03 MED FILL — LOSARTAN POTASSIUM 50 MG TA: 50 | 30 days supply | Qty: 30 | Fill #0

## 2015-08-03 MED FILL — raNITIdine HCL 150 MG TABS: 150 | 30 days supply | Qty: 30 | Fill #0

## 2015-08-03 MED FILL — ATORVASTATIN 40 MG TABLET: 40 | 30 days supply | Qty: 30 | Fill #0

## 2015-08-03 MED FILL — CYCLOBENZAPRINE 10 MG TAB: 10 | 30 days supply | Qty: 30 | Fill #0

## 2015-08-03 NOTE — Progress Notes (Signed)
Patient here for follow on her HTN and cholesterol Patient also still having some minor pain to her back

## 2015-08-03 NOTE — Progress Notes (Signed)
Patient ID: Kayla Lowe, female   DOB: January 09, 1956, 60 y.o.   MRN: [id number] Subjective:  Kayla Lowe is a 60 y.o. female with hypertension and hyperlipidemia. Patient reports that she has been compliant with all medications and has had no complications of chest pain, palpitations, myalgias, or claudications.  Patient complains of mild lower back pain described as a intermittent muscle spasm. Pain is very infrequent and may last all day. Once she takes a flexeril the pain resolves.    Current Outpatient Prescriptions  Medication Sig Dispense Refill  . amLODipine (NORVASC) 10 MG tablet Take 1 tablet (10 mg total) by mouth every evening. 30 tablet 5  . atorvastatin (LIPITOR) 40 MG tablet Take 1 tablet (40 mg total) by mouth daily. 90 tablet 5  . cholecalciferol (VITAMIN D) 1000 UNITS tablet Take 1,000 Units by mouth every evening.    Marland Kitchen co-enzyme Q-10 50 MG capsule Take 100 mg by mouth daily after lunch.    . cyclobenzaprine (FLEXERIL) 10 MG tablet Take 1 tablet (10 mg total) by mouth at bedtime. 30 tablet 1  . loratadine (CLARITIN) 10 MG tablet Take 1 tablet (10 mg total) by mouth daily. 30 tablet 11  . losartan (COZAAR) 50 MG tablet Take 1 tablet (50 mg total) by mouth daily. 30 tablet 5  . Omega-3 Fatty Acids (FISH OIL) 1000 MG CAPS Take 1,000 mg by mouth every evening.    . ranitidine (ZANTAC) 150 MG tablet Take 1 tablet (150 mg total) by mouth at bedtime. 30 tablet 5  . Bisacodyl (DULCOLAX PO) Take by mouth as directed. As directed for colonoscopy prep    . chlorpheniramine-HYDROcodone (TUSSIONEX PENNKINETIC ER) 10-8 MG/5ML LQCR Take 5 mLs by mouth every 12 (twelve) hours as needed for cough. 120 mL 0  . fluticasone (FLONASE) 50 MCG/ACT nasal spray Place 2 sprays into both nostrils daily. 16 g 6  . omeprazole (PRILOSEC) 20 MG capsule Take 1 capsule (20 mg total) by mouth every morning. (Patient not taking: Reported on 01/19/2015) 30 capsule 11  . polyethylene glycol  powder (MIRALAX) powder Take 1 Container by mouth once. As directed for colonoscopy prep     No current facility-administered medications for this visit.    Hypertension ROS: taking medications as instructed, no medication side effects noted, no TIA's, no chest pain on exertion, no dyspnea on exertion, no swelling of ankles, no palpitations and no intermittent claudication symptoms. All other systems negative.   Objective:  BP 135/79 mmHg  Pulse 63  Temp(Src) 98 F (36.7 C)  Resp 16  Ht  (1.651 m)  Wt 212 lb (96.163 kg)  BMI 35.28 kg/m2  SpO2 100%  Appearance alert, well appearing, and in no distress, oriented to person, place, and time and overweight. General exam BP noted to be well controlled today in office, S1, S2 normal, no gallop, no murmur, chest clear, no JVD, no HSM, no edema.  Lab review: orders written for new lab studies as appropriate; see orders.   Assessment:   Gennie was seen today for follow-up.  Diagnoses and all orders for this visit:  Essential hypertension -     losartan (COZAAR) 50 MG tablet; Take 1 tablet (50 mg total) by mouth daily. -     amLODipine (NORVASC) 10 MG tablet; Take 1 tablet (10 mg total) by mouth every evening. -     Basic Metabolic Panel Patient blood pressure is stable and may continue on current medication.  Education on diet, exercise, and modifiable risk  factors discussed. Will obtain appropriate labs as needed. Will follow up in 3-6 months.   HLD (hyperlipidemia) -     atorvastatin (LIPITOR) 40 MG tablet; Take 1 tablet (40 mg total) by mouth daily. -     Lipid panel Education provided on proper lifestyle changes in order to lower cholesterol. Patient advised to maintain healthy weight and to keep total fat intake at 25-35% of total calories and carbohydrates 50-60% of total daily calories. Explained how high cholesterol places patient at risk for heart disease. Patient placed on appropriate medication and repeat labs in 6 months    Gastroesophageal reflux disease, esophagitis presence not specified -     ranitidine (ZANTAC) 150 MG tablet; Take 1 tablet (150 mg total) by mouth at bedtime. Stable, refilled   Back muscle spasm -     cyclobenzaprine (FLEXERIL) 10 MG tablet; Take 1 tablet (10 mg total) by mouth at bedtime.   Plan:  Current treatment plan is effective, no change in therapy. Reviewed diet, exercise and weight control. Recommended sodium restriction. Copy of written low fat low cholesterol diet provided and reviewed. Cardiovascular risk and specific lipid/LDL goals reviewed.    Return in about 6 months (around 02/03/2016) for Hypertension.   Ambrose FinlandValerie A Ashlin Kreps, NP 08/03/2015 9:37 AM

## 2015-08-03 NOTE — Addendum Note (Signed)
Addended by: Lestine MountJUAREZ, Kiandre Spagnolo L on: 08/03/2015 09:40 AM   Modules accepted: Orders

## 2015-08-09 ENCOUNTER — Telehealth: Payer: Self-pay

## 2015-08-09 NOTE — Telephone Encounter (Signed)
-----   Message from Ambrose FinlandValerie A Keck, NP sent at 08/08/2015  8:58 PM EDT ----- Patients cholesterol is extremely high. How long has she been off cholesterol medication? If she has been consistently on the medication she may need a increase in dose. Please find out

## 2015-08-09 NOTE — Telephone Encounter (Signed)
Tried to contact patient  Patient not available Person who answered the phone hung up twice

## 2015-09-05 MED FILL — LOSARTAN POTASSIUM 50 MG TA: 50 | 30 days supply | Qty: 30 | Fill #1

## 2015-09-19 MED FILL — FLUTICASONE PROP 50 MCG SPR: 50 | 30 days supply | Qty: 16 | Fill #2

## 2015-09-19 MED FILL — raNITIdine HCL 150 MG TABS: 150 | 30 days supply | Qty: 30 | Fill #1

## 2015-09-19 MED FILL — ?ATORVASTATIN 40MG TABLET: 40 | 30 days supply | Qty: 30 | Fill #1

## 2015-09-19 MED FILL — ?AMLODIPINE BESYLATE 10 MG: 10 | 30 days supply | Qty: 30 | Fill #1

## 2015-10-03 MED FILL — LOSARTAN POTASSIUM 50 MG TA: 50 | 30 days supply | Qty: 30 | Fill #2

## 2015-10-19 MED FILL — ?ATORVASTATIN 40MG TABLET: 40 | 30 days supply | Qty: 30 | Fill #2

## 2015-10-19 MED FILL — raNITIdine HCL 150 MG TABS: 150 | 30 days supply | Qty: 30 | Fill #2

## 2015-10-19 MED FILL — ?AMLODIPINE BESYLATE 10 MG: 10 | 30 days supply | Qty: 30 | Fill #2

## 2015-11-07 MED FILL — LOSARTAN POTASSIUM 50 MG TA: 50 | 30 days supply | Qty: 30 | Fill #3

## 2015-11-21 MED FILL — raNITIdine HCL 150 MG TABS: 150 | 30 days supply | Qty: 30 | Fill #3

## 2015-11-21 MED FILL — AMLODIPINE BESYLATE 10 MG T: 10 | 30 days supply | Qty: 30 | Fill #3

## 2015-11-21 MED FILL — ?ATORVASTATIN 40MG TABLET: 40 | 30 days supply | Qty: 30 | Fill #3

## 2015-12-05 MED FILL — LOSARTAN POTASSIUM 50 MG TA: 50 | 30 days supply | Qty: 30 | Fill #4

## 2015-12-06 MED FILL — FLUTICASONE PROP 50 MCG SPR: 50 | 30 days supply | Qty: 16 | Fill #3

## 2015-12-20 MED FILL — AMLODIPINE BESYLATE 10 MG T: 10 | 30 days supply | Qty: 30 | Fill #4

## 2015-12-20 MED FILL — ?ATORVASTATIN 40MG TABLET: 40 | 30 days supply | Qty: 30 | Fill #4

## 2015-12-20 MED FILL — raNITIdine HCL 150 MG TABS: 150 | 30 days supply | Qty: 30 | Fill #4

## 2016-01-04 MED FILL — LOSARTAN POTASSIUM 50 MG TA: 50 | 30 days supply | Qty: 30 | Fill #5

## 2016-01-23 MED FILL — AMLODIPINE BESYLATE 10 MG T: 10 | 30 days supply | Qty: 30 | Fill #5

## 2016-01-23 MED FILL — ?ATORVASTATIN 40MG TABLET: 40 | 30 days supply | Qty: 30 | Fill #5

## 2016-01-23 MED FILL — raNITIdine HCL 150 MG TABS: 150 | 30 days supply | Qty: 30 | Fill #5

## 2016-02-06 ENCOUNTER — Ambulatory Visit: Payer: No Typology Code available for payment source | Attending: Family Medicine | Admitting: Family Medicine

## 2016-02-06 ENCOUNTER — Encounter: Payer: Self-pay | Admitting: Family Medicine

## 2016-02-06 ENCOUNTER — Other Ambulatory Visit: Payer: Self-pay | Admitting: Family Medicine

## 2016-02-06 VITALS — BP 124/73 | HR 78 | Temp 97.0°F | Resp 16 | Ht 63.0 in | Wt 212.0 lb

## 2016-02-06 DIAGNOSIS — Z114 Encounter for screening for human immunodeficiency virus [HIV]: Secondary | ICD-10-CM | POA: Insufficient documentation

## 2016-02-06 DIAGNOSIS — Z1159 Encounter for screening for other viral diseases: Secondary | ICD-10-CM | POA: Insufficient documentation

## 2016-02-06 DIAGNOSIS — R768 Other specified abnormal immunological findings in serum: Secondary | ICD-10-CM | POA: Insufficient documentation

## 2016-02-06 DIAGNOSIS — J309 Allergic rhinitis, unspecified: Secondary | ICD-10-CM | POA: Insufficient documentation

## 2016-02-06 DIAGNOSIS — Z1231 Encounter for screening mammogram for malignant neoplasm of breast: Secondary | ICD-10-CM

## 2016-02-06 DIAGNOSIS — Z23 Encounter for immunization: Secondary | ICD-10-CM

## 2016-02-06 DIAGNOSIS — Z1211 Encounter for screening for malignant neoplasm of colon: Secondary | ICD-10-CM

## 2016-02-06 DIAGNOSIS — R0981 Nasal congestion: Secondary | ICD-10-CM | POA: Insufficient documentation

## 2016-02-06 DIAGNOSIS — M6789 Other specified disorders of synovium and tendon, multiple sites: Secondary | ICD-10-CM

## 2016-02-06 DIAGNOSIS — I10 Essential (primary) hypertension: Secondary | ICD-10-CM | POA: Insufficient documentation

## 2016-02-06 DIAGNOSIS — E785 Hyperlipidemia, unspecified: Secondary | ICD-10-CM

## 2016-02-06 DIAGNOSIS — M6790 Unspecified disorder of synovium and tendon, unspecified site: Secondary | ICD-10-CM | POA: Insufficient documentation

## 2016-02-06 DIAGNOSIS — R785 Finding of other psychotropic drug in blood: Secondary | ICD-10-CM | POA: Insufficient documentation

## 2016-02-06 MED ORDER — LOSARTAN POTASSIUM 50 MG PO TABS: 50.0000 mg | ORAL_TABLET | Freq: Every day | ORAL | 3 refills | Status: DC

## 2016-02-06 MED ORDER — LORATADINE 10 MG PO TABS: 10.0000 mg | ORAL_TABLET | Freq: Every day | ORAL | 3 refills | Status: DC

## 2016-02-06 MED ORDER — AMLODIPINE BESYLATE 10 MG PO TABS: 10.0000 mg | ORAL_TABLET | Freq: Every evening | ORAL | 3 refills | Status: DC

## 2016-02-06 MED ORDER — ATORVASTATIN CALCIUM 40 MG PO TABS: 40.0000 mg | ORAL_TABLET | Freq: Every day | ORAL | 3 refills | Status: DC

## 2016-02-06 MED FILL — LOSARTAN POTASSIUM 50 MG TA: 50 | 30 days supply | Qty: 30 | Fill #0

## 2016-02-06 NOTE — Assessment & Plan Note (Signed)
A: well controlled Med: compliant P:  Refilled losartan and norvasc

## 2016-02-06 NOTE — Patient Instructions (Addendum)
Elease Hashimotoatricia was seen today for hypertension.  Diagnoses and all orders for this visit:  Essential hypertension -     losartan (COZAAR) 50 MG tablet; Take 1 tablet (50 mg total) by mouth daily. -     amLODipine (NORVASC) 10 MG tablet; Take 1 tablet (10 mg total) by mouth every evening.  Encounter for immunization  Elevated rheumatoid factor -     Ambulatory referral to Rheumatology -     ANA,IFA RA Diag Pnl w/rflx Tit/Patn -     Rheumatoid factor  HLD (hyperlipidemia) -     atorvastatin (LIPITOR) 40 MG tablet; Take 1 tablet (40 mg total) by mouth daily.  Nasal congestion  Special screening for malignant neoplasms, colon  Screening for HIV (human immunodeficiency virus) -     HIV antibody (with reflex)  Need for hepatitis C screening test -     Hepatitis C antibody, reflex  Needs flu shot -     Flu Vaccine QUAD 36+ mos IM  Allergic rhinitis, unspecified allergic rhinitis type -     loratadine (CLARITIN) 10 MG tablet; Take 1 tablet (10 mg total) by mouth daily.  Visit for screening mammogram -     MM DIGITAL SCREENING BILATERAL; Future    Mammogram due to December 2017   F/u in 6 months for HTN, sooner if needed  Dr. Armen PickupFunches

## 2016-02-06 NOTE — Assessment & Plan Note (Signed)
Repeat ANA and rheumatoid factor if persistently elevated Will refer to rheumatology

## 2016-02-06 NOTE — Assessment & Plan Note (Signed)
Small painless nodule in R hand at 4th finger Not causing pain or interfering with function  Will monitor

## 2016-02-06 NOTE — Progress Notes (Signed)
F/U HTN  Taking medication as prescribed  No pain today No suicidal thought in the past two weeks  No tobacco user  Flu Inj today  Requesting medication refills Rx Losartan

## 2016-02-06 NOTE — Progress Notes (Signed)
Subjective:  Patient ID: Kayla Lowe, female    DOB: 1955-08-22  Age: 60 y.o. MRN: [id number]  CC: Hypertension   HPI Korbyn Vanes presents for    1. CHRONIC HYPERTENSION  Disease Monitoring  Blood pressure range: not checking   Chest pain: no   Dyspnea: no   Claudication: no   Medication compliance: yes  Medication Side Effects  Lightheadedness: no   Urinary frequency: no   Edema: yes, in feet only      Preventitive Healthcare:  Exercise: no   Diet Pattern: regular meals   Salt Restriction: yes   2. Finger nodule: x 2 weeks. First in DIP joint of 3rd finger of L hand, now in palm along 4th finger tendon. Painless. No stiffness in joint. Has hx of elevated rheumatoid factor in 2016.   Social History  Substance Use Topics  . Smoking status: Never Smoker  . Smokeless tobacco: Never Used  . Alcohol use No    Outpatient Medications Prior to Visit  Medication Sig Dispense Refill  . amLODipine (NORVASC) 10 MG tablet Take 1 tablet (10 mg total) by mouth every evening. 30 tablet 5  . atorvastatin (LIPITOR) 40 MG tablet Take 1 tablet (40 mg total) by mouth daily. 90 tablet 5  . cholecalciferol (VITAMIN D) 1000 UNITS tablet Take 1,000 Units by mouth every evening.    Marland Kitchen co-enzyme Q-10 50 MG capsule Take 100 mg by mouth daily after lunch.    . cyclobenzaprine (FLEXERIL) 10 MG tablet Take 1 tablet (10 mg total) by mouth at bedtime. 30 tablet 1  . fluticasone (FLONASE) 50 MCG/ACT nasal spray Place 2 sprays into both nostrils daily. 16 g 6  . loratadine (CLARITIN) 10 MG tablet Take 1 tablet (10 mg total) by mouth daily. 30 tablet 11  . losartan (COZAAR) 50 MG tablet Take 1 tablet (50 mg total) by mouth daily. 30 tablet 5  . Omega-3 Fatty Acids (FISH OIL) 1000 MG CAPS Take 1,000 mg by mouth every evening.    . ranitidine (ZANTAC) 150 MG tablet Take 1 tablet (150 mg total) by mouth at bedtime. 30 tablet 5   No facility-administered medications prior  to visit.     ROS Review of Systems  Constitutional: Negative for chills and fever.  Eyes: Negative for visual disturbance.  Respiratory: Negative for shortness of breath.   Cardiovascular: Negative for chest pain.  Gastrointestinal: Negative for abdominal pain and blood in stool.  Musculoskeletal: Negative for arthralgias and back pain.  Skin: Negative for rash.  Allergic/Immunologic: Negative for immunocompromised state.  Hematological: Negative for adenopathy. Does not bruise/bleed easily.  Psychiatric/Behavioral: Negative for dysphoric mood and suicidal ideas.    Objective:  BP 124/73 (BP Location: Left Arm, Patient Position: Sitting, Cuff Size: Large)   Pulse 78   Temp 97 F (36.1 C) (Oral)   Resp 16   Ht 5\' 3"  (1.6 m)   Wt 212 lb (96.2 kg)   SpO2 100%   BMI 37.55 kg/m   BP/Weight 02/06/2016 08/03/2015 05/03/2015  Systolic BP 124 135 139  Diastolic BP 73 79 77  Wt. (Lbs) 212 212 213  BMI 37.55 35.28 35.44    Physical Exam  Constitutional: She is oriented to person, place, and time. She appears well-developed and well-nourished. No distress.  HENT:  Head: Normocephalic and atraumatic.  Cardiovascular: Normal rate, regular rhythm, normal heart sounds and intact distal pulses.   Pulmonary/Chest: Effort normal and breath sounds normal.  Musculoskeletal: She exhibits edema (trace in  feet ).       Hands: Neurological: She is alert and oriented to person, place, and time.  Skin: Skin is warm and dry. No rash noted.  Psychiatric: She has a normal mood and affect.    Assessment & Plan:  Elease Hashimotoatricia was seen today for hypertension.  Diagnoses and all orders for this visit:  Essential hypertension -     losartan (COZAAR) 50 MG tablet; Take 1 tablet (50 mg total) by mouth daily. -     amLODipine (NORVASC) 10 MG tablet; Take 1 tablet (10 mg total) by mouth every evening.  Encounter for immunization  Elevated rheumatoid factor -     Ambulatory referral to  Rheumatology -     ANA,IFA RA Diag Pnl w/rflx Tit/Patn -     Rheumatoid factor  HLD (hyperlipidemia) -     atorvastatin (LIPITOR) 40 MG tablet; Take 1 tablet (40 mg total) by mouth daily.  Nasal congestion  Special screening for malignant neoplasms, colon  Screening for HIV (human immunodeficiency virus) -     HIV antibody (with reflex)  Need for hepatitis C screening test -     Hepatitis C antibody, reflex  Needs flu shot -     Flu Vaccine QUAD 36+ mos IM  Allergic rhinitis, unspecified allergic rhinitis type -     loratadine (CLARITIN) 10 MG tablet; Take 1 tablet (10 mg total) by mouth daily.  Visit for screening mammogram -     MM DIGITAL SCREENING BILATERAL; Future   No orders of the defined types were placed in this encounter.   Follow-up: Return in about 6 months (around 08/05/2016) for HTN .   Dessa PhiJosalyn Demetrio Leighty MD

## 2016-02-07 LAB — ANA,IFA RA DIAG PNL W/RFLX TIT/PATN: Anti Nuclear Antibody(ANA): NEGATIVE

## 2016-02-07 LAB — RHEUMATOID FACTOR: RHEUMATOID FACTOR: 67 [IU]/mL — AB (ref <14)

## 2016-02-07 LAB — HIV ANTIBODY (ROUTINE TESTING W REFLEX): HIV: NONREACTIVE

## 2016-02-07 LAB — HEPATITIS C ANTIBODY: HCV Ab: NEGATIVE

## 2016-02-22 ENCOUNTER — Other Ambulatory Visit: Payer: Self-pay | Admitting: Internal Medicine

## 2016-02-22 DIAGNOSIS — K219 Gastro-esophageal reflux disease without esophagitis: Secondary | ICD-10-CM

## 2016-02-22 MED FILL — ATORVASTATIN 40 MG TABLET: 40 | 90 days supply | Qty: 90 | Fill #0

## 2016-02-22 MED FILL — raNITIdine HCL 150 MG TABS: 150 | 90 days supply | Qty: 90 | Fill #0

## 2016-02-22 MED FILL — AMLODIPINE BESYLATE 10 MG T: 10 | 90 days supply | Qty: 90 | Fill #0

## 2016-03-12 MED FILL — LOSARTAN POTASSIUM 50 MG TA: 50 | 90 days supply | Qty: 90 | Fill #1

## 2016-03-14 ENCOUNTER — Other Ambulatory Visit: Payer: Self-pay | Admitting: Internal Medicine

## 2016-03-14 DIAGNOSIS — J3089 Other allergic rhinitis: Secondary | ICD-10-CM

## 2016-04-11 MED FILL — FLUTICASONE PROP 50 MCG SPR: 50 | 30 days supply | Qty: 16 | Fill #0

## 2016-05-29 MED FILL — raNITIdine HCL 150 MG TABS: 150 | 90 days supply | Qty: 90 | Fill #1

## 2016-05-29 MED FILL — AMLODIPINE BESYLATE 10 MG T: 10 | 90 days supply | Qty: 90 | Fill #1

## 2016-05-29 MED FILL — ATORVASTATIN 40 MG TABLET: 40 | 90 days supply | Qty: 90 | Fill #1

## 2016-06-14 MED FILL — LOSARTAN POTASSIUM 50 MG TA: 50 | 90 days supply | Qty: 90 | Fill #2

## 2016-07-18 ENCOUNTER — Other Ambulatory Visit: Payer: Self-pay | Admitting: Family Medicine

## 2016-07-18 DIAGNOSIS — Z1231 Encounter for screening mammogram for malignant neoplasm of breast: Secondary | ICD-10-CM

## 2016-08-03 ENCOUNTER — Ambulatory Visit
Admission: RE | Admit: 2016-08-03 | Discharge: 2016-08-03 | Disposition: A | Discharge status: Home / Self Care | Payer: No Typology Code available for payment source | Source: Ambulatory Visit | Attending: Family Medicine | Admitting: Family Medicine

## 2016-08-03 DIAGNOSIS — Z1231 Encounter for screening mammogram for malignant neoplasm of breast: Secondary | ICD-10-CM

## 2016-08-31 ENCOUNTER — Ambulatory Visit: Payer: Self-pay | Attending: Family Medicine | Admitting: Family Medicine

## 2016-08-31 ENCOUNTER — Encounter: Payer: Self-pay | Admitting: Family Medicine

## 2016-08-31 VITALS — BP 140/76 | HR 85 | Temp 97.9°F | Ht 63.0 in | Wt 204.4 lb

## 2016-08-31 DIAGNOSIS — E785 Hyperlipidemia, unspecified: Secondary | ICD-10-CM | POA: Insufficient documentation

## 2016-08-31 DIAGNOSIS — Z79899 Other long term (current) drug therapy: Secondary | ICD-10-CM | POA: Insufficient documentation

## 2016-08-31 DIAGNOSIS — I10 Essential (primary) hypertension: Secondary | ICD-10-CM | POA: Insufficient documentation

## 2016-08-31 DIAGNOSIS — E7800 Pure hypercholesterolemia, unspecified: Secondary | ICD-10-CM | POA: Insufficient documentation

## 2016-08-31 DIAGNOSIS — J309 Allergic rhinitis, unspecified: Secondary | ICD-10-CM | POA: Insufficient documentation

## 2016-08-31 DIAGNOSIS — J3089 Other allergic rhinitis: Secondary | ICD-10-CM

## 2016-08-31 DIAGNOSIS — K219 Gastro-esophageal reflux disease without esophagitis: Secondary | ICD-10-CM | POA: Insufficient documentation

## 2016-08-31 DIAGNOSIS — E669 Obesity, unspecified: Secondary | ICD-10-CM | POA: Insufficient documentation

## 2016-08-31 MED ORDER — RANITIDINE HCL 150 MG PO TABS: 150.0000 mg | ORAL_TABLET | Freq: Two times a day (BID) | ORAL | 11 refills | Status: DC

## 2016-08-31 MED ORDER — LORATADINE 10 MG PO TABS: 10.0000 mg | ORAL_TABLET | Freq: Every day | ORAL | 11 refills | Status: DC

## 2016-08-31 MED ORDER — FLUTICASONE PROPIONATE 50 MCG/ACT NA SUSP: 2.0000 | Freq: Every day | NASAL | 5 refills | Status: DC

## 2016-08-31 MED FILL — FLUTICASONE PROP 50 MCG SPR: 50 | 30 days supply | Qty: 16 | Fill #0

## 2016-08-31 MED FILL — raNITIdine HCL 150 MG TABS: 150 | 30 days supply | Qty: 60 | Fill #0

## 2016-08-31 NOTE — Patient Instructions (Addendum)
Tuwanda was seen today for hypertension.  Diagnoses and all orders for this visit:  Essential hypertension, benign -     CMP14+EGFR; Future  Pure hypercholesterolemia -     LDL Cholesterol, Direct; Future  Gastroesophageal reflux disease, esophagitis presence not specified -     ranitidine (ZANTAC) 150 MG tablet; Take 1 tablet (150 mg total) by mouth 2 (two) times daily.  Other allergic rhinitis -     fluticasone (FLONASE) 50 MCG/ACT nasal spray; Place 2 sprays into both nostrils daily. -     loratadine (CLARITIN) 10 MG tablet; Take 1 tablet (10 mg total) by mouth daily.   Please return next week for fasting labs, AM appointment Stop Lipitor for 2 weeks and send me a message with observations, improvement, worsening, no change in cramps  Return in one week for repeat BP check and lab work. If BP is > 140/90 next week we will increase losartan to 100 mg daily.   f/u in 3 months for HTN   Dr. Adrian Blackwater

## 2016-08-31 NOTE — Progress Notes (Signed)
Subjective:  Patient ID: Kayla Lowe, female    DOB: 01/31/1956  Age: 61 y.o. MRN: [id number]  CC: Hypertension   HPI Kayla Lowe presents for    1. CHRONIC HYPERTENSION  Disease Monitoring  Blood pressure range: not checking   Chest pain: no   Dyspnea: no   Claudication: no   Medication compliance: yes  Medication Side Effects  Lightheadedness: no   Urinary frequency: no   Edema: yes, in feet only      Preventitive Healthcare:  Exercise: no   Diet Pattern: regular meals   Salt Restriction: yes   2. Muscle spasms: occur mostly at night. Occur on flanks and in legs. She wonders if the cause is Lipitor. She is drinking quinine water to relieve spasm.   3. Obesity: she is working on weight loss. She eats a protein smoothie in the morning, reduced sugar intake, reduced caffeine, controlling portion sizes.   Social History  Substance Use Topics  . Smoking status: Never Smoker  . Smokeless tobacco: Never Used  . Alcohol use No    Outpatient Medications Prior to Visit  Medication Sig Dispense Refill  . amLODipine (NORVASC) 10 MG tablet Take 1 tablet (10 mg total) by mouth every evening. 90 tablet 3  . atorvastatin (LIPITOR) 40 MG tablet Take 1 tablet (40 mg total) by mouth daily. 90 tablet 3  . cholecalciferol (VITAMIN D) 1000 UNITS tablet Take 1,000 Units by mouth every evening.    Marland Kitchen co-enzyme Q-10 50 MG capsule Take 100 mg by mouth daily after lunch.    . cyclobenzaprine (FLEXERIL) 10 MG tablet Take 1 tablet (10 mg total) by mouth at bedtime. 30 tablet 1  . fluticasone (FLONASE) 50 MCG/ACT nasal spray PLACE 2 SPRAYS INTO BOTH NOSTRILS DAILY. 16 g 2  . loratadine (CLARITIN) 10 MG tablet Take 1 tablet (10 mg total) by mouth daily. 90 tablet 3  . losartan (COZAAR) 50 MG tablet Take 1 tablet (50 mg total) by mouth daily. 90 tablet 3  . Omega-3 Fatty Acids (FISH OIL) 1000 MG CAPS Take 1,000 mg by mouth every evening.    . ranitidine (ZANTAC)  150 MG tablet TAKE 1 TABLET BY MOUTH AT BEDTIME. 30 tablet 5   No facility-administered medications prior to visit.     ROS Review of Systems  Constitutional: Negative for chills and fever.  Eyes: Negative for visual disturbance.  Respiratory: Negative for shortness of breath.   Cardiovascular: Negative for chest pain.  Gastrointestinal: Negative for abdominal pain and blood in stool.  Musculoskeletal: Negative for arthralgias and back pain.  Skin: Negative for rash.  Allergic/Immunologic: Negative for immunocompromised state.  Hematological: Negative for adenopathy. Does not bruise/bleed easily.  Psychiatric/Behavioral: Negative for dysphoric mood and suicidal ideas.    Objective:  BP 140/76   Pulse 85   Temp 97.9 F (36.6 C) (Oral)   Ht 5' 3"  (1.6 m)   Wt 204 lb 6.4 oz (92.7 kg)   SpO2 100%   BMI 36.21 kg/m   BP/Weight 08/31/2016 02/06/2016 07/25/9700  Systolic BP 637 858 850  Diastolic BP 76 73 79  Wt. (Lbs) 204.4 212 212  BMI 36.21 37.55 35.28    Physical Exam  Constitutional: She is oriented to person, place, and time. She appears well-developed and well-nourished. No distress.  HENT:  Head: Normocephalic and atraumatic.  Cardiovascular: Normal rate, regular rhythm, normal heart sounds and intact distal pulses.   Pulmonary/Chest: Effort normal and breath sounds normal.  Musculoskeletal: She exhibits  edema (trace in feet ).  Neurological: She is alert and oriented to person, place, and time.  Skin: Skin is warm and dry. No rash noted.  Psychiatric: She has a normal mood and affect.    Assessment & Plan:  Kayla Lowe was seen today for hypertension.  Diagnoses and all orders for this visit:  Essential hypertension, benign -     CMP14+EGFR; Future  Pure hypercholesterolemia -     LDL Cholesterol, Direct; Future  Gastroesophageal reflux disease, esophagitis presence not specified -     ranitidine (ZANTAC) 150 MG tablet; Take 1 tablet (150 mg total) by mouth 2  (two) times daily.  Other allergic rhinitis -     fluticasone (FLONASE) 50 MCG/ACT nasal spray; Place 2 sprays into both nostrils daily. -     loratadine (CLARITIN) 10 MG tablet; Take 1 tablet (10 mg total) by mouth daily.   No orders of the defined types were placed in this encounter.   Follow-up: Return in about 1 week (around 09/07/2016) for BP check and labs .   Boykin Nearing MD

## 2016-09-02 NOTE — Assessment & Plan Note (Signed)
Trial off Lipitor 40 mg daily due to muscle aches Checking lipid panel If myalgias improve reduce Lipitor to 10 or 20 mg daily

## 2016-09-02 NOTE — Assessment & Plan Note (Addendum)
A: foot swelling and slightly elevated BP with Norvasc 10 mg and losartan 50 mg daily P: Metabolic panel Continue losartan 50 mg daily with increase to 100 mg daily if BP > 140/90   Continue norvasc 10 mg daily. Considering change to HCTZ 25 mg nightly due to foot swelling.

## 2016-09-05 ENCOUNTER — Ambulatory Visit: Payer: Self-pay | Attending: Family Medicine

## 2016-09-05 ENCOUNTER — Ambulatory Visit (HOSPITAL_BASED_OUTPATIENT_CLINIC_OR_DEPARTMENT_OTHER): Payer: Self-pay | Admitting: *Deleted

## 2016-09-05 VITALS — BP 130/84 | HR 79 | Resp 16

## 2016-09-05 DIAGNOSIS — I10 Essential (primary) hypertension: Secondary | ICD-10-CM

## 2016-09-05 DIAGNOSIS — E7800 Pure hypercholesterolemia, unspecified: Secondary | ICD-10-CM | POA: Insufficient documentation

## 2016-09-05 MED FILL — AMLODIPINE BESYLATE 10 MG T: 10 | 90 days supply | Qty: 90 | Fill #2

## 2016-09-05 NOTE — Progress Notes (Signed)
Patient here for lab visit only 

## 2016-09-05 NOTE — Progress Notes (Signed)
Pt here for f/u BP check after office visit with Dr. Armen Pickup. Pt denies chest pain, SOB, HA, new vison concerns, or generalized swelling.   Pt states she has is taking medications as prescribed and does take BP at home. Pt states she took medication this morning.   Blood pressure taken manually while patient is sitting.   BP: 136/90, 130/84  Nurse visit will be routed to provider.Guy Franco, RN, BSN

## 2016-09-06 LAB — CMP14+EGFR
ALT: 13 IU/L (ref 0–32)
AST: 16 IU/L (ref 0–40)
Albumin/Globulin Ratio: 1.3 (ref 1.2–2.2)
Albumin: 4.3 g/dL (ref 3.6–4.8)
Alkaline Phosphatase: 131 IU/L — ABNORMAL HIGH (ref 39–117)
BUN/Creatinine Ratio: 14 (ref 12–28)
BUN: 11 mg/dL (ref 8–27)
Bilirubin Total: 0.5 mg/dL (ref 0.0–1.2)
CALCIUM: 10.3 mg/dL (ref 8.7–10.3)
CO2: 24 mmol/L (ref 18–29)
CREATININE: 0.79 mg/dL (ref 0.57–1.00)
Chloride: 103 mmol/L (ref 96–106)
GFR calc Af Amer: 92 mL/min/{1.73_m2} (ref >59)
GFR, EST NON AFRICAN AMERICAN: 80 mL/min/{1.73_m2} (ref >59)
GLOBULIN, TOTAL: 3.2 g/dL (ref 1.5–4.5)
Glucose: 86 mg/dL (ref 65–99)
Potassium: 4.4 mmol/L (ref 3.5–5.2)
SODIUM: 142 mmol/L (ref 134–144)
Total Protein: 7.5 g/dL (ref 6.0–8.5)

## 2016-09-06 LAB — LDL CHOLESTEROL, DIRECT: LDL Direct: 159 mg/dL — ABNORMAL HIGH (ref 0–99)

## 2016-09-17 ENCOUNTER — Encounter: Payer: Self-pay | Admitting: Family Medicine

## 2016-09-17 NOTE — Telephone Encounter (Signed)
Patient concern

## 2016-09-26 ENCOUNTER — Encounter: Payer: Self-pay | Admitting: Family Medicine

## 2016-09-26 ENCOUNTER — Telehealth: Payer: Self-pay

## 2016-09-26 ENCOUNTER — Telehealth: Payer: Self-pay | Admitting: Pharmacist

## 2016-09-26 NOTE — Telephone Encounter (Signed)
[facility], patient wanted to know if she was going to get a script for atorvastatin 20 mg or if she needed to always split the atorvastatin 40 mg. Advised patient to split current atorvastatin 40 mg tablets now and if she tolerates it, Dr. Armen PickupFunches will likely order the lower strength to continue on.

## 2016-09-26 NOTE — Telephone Encounter (Signed)
Pt was called and informed of lab results. 

## 2016-09-28 ENCOUNTER — Other Ambulatory Visit: Payer: Self-pay | Admitting: Family Medicine

## 2016-09-28 DIAGNOSIS — I10 Essential (primary) hypertension: Secondary | ICD-10-CM

## 2016-09-28 MED FILL — LOSARTAN POTASSIUM 50 MG TA: 50 | 90 days supply | Qty: 90 | Fill #3

## 2016-10-01 ENCOUNTER — Encounter: Payer: Self-pay | Admitting: Family Medicine

## 2016-10-01 MED ORDER — ATORVASTATIN CALCIUM 10 MG PO TABS: 10.0000 mg | ORAL_TABLET | Freq: Every day | ORAL | 3 refills | Status: DC

## 2016-10-01 MED FILL — raNITIdine HCL 150 MG TABS: 150 | 30 days supply | Qty: 60 | Fill #1

## 2016-10-01 MED FILL — ?ATORVASTATIN 10 MG TABLET: 10 | 30 days supply | Qty: 30 | Fill #0

## 2016-10-01 NOTE — Telephone Encounter (Signed)
Medication concern

## 2016-11-05 ENCOUNTER — Encounter: Payer: Self-pay | Admitting: Family Medicine

## 2016-11-05 NOTE — Telephone Encounter (Signed)
Patient concern for Dr. Funches

## 2016-11-06 MED FILL — FLUTICASONE PROP 50 MCG SPR: 50 | 30 days supply | Qty: 16 | Fill #1

## 2016-11-08 ENCOUNTER — Telehealth: Payer: Self-pay | Admitting: Family Medicine

## 2016-11-08 NOTE — Telephone Encounter (Signed)
PT came to the office to try to speak with the pcp or the nurse,  Dr. Armen PickupFunches  She  still having some muscle pains and cramps with the 10 mg Lipitor. I only have about 3 pills left  and I have had a virus or cold that lasted about 3 weeks but I am much better now. Would like to  know if can change to different cholesterol med or not. Will be seeing you on the 20th of July, hopefully.   I received your letter stating you will be leaving in august, [place], but we'll talk about that later. Please  let me know about medication. Thanks  Please follow up

## 2016-11-16 MED ORDER — PRAVASTATIN SODIUM 10 MG PO TABS: 10.0000 mg | ORAL_TABLET | Freq: Every day | ORAL | 2 refills | Status: DC

## 2016-11-16 MED FILL — PRAVASTATIN NA 10 MG TAB: 10 | 30 days supply | Qty: 30 | Fill #0

## 2016-11-16 NOTE — Telephone Encounter (Signed)
Addressed with patient via [facility] [facility] e[facility]mail

## 2016-11-30 ENCOUNTER — Telehealth: Payer: Self-pay | Admitting: Family Medicine

## 2016-11-30 ENCOUNTER — Encounter: Payer: Self-pay | Admitting: Family Medicine

## 2016-11-30 ENCOUNTER — Ambulatory Visit: Payer: Self-pay | Attending: Family Medicine | Admitting: Family Medicine

## 2016-11-30 VITALS — BP 140/92 | HR 78 | Temp 98.3°F | Ht 63.0 in | Wt 203.4 lb

## 2016-11-30 DIAGNOSIS — E785 Hyperlipidemia, unspecified: Secondary | ICD-10-CM | POA: Insufficient documentation

## 2016-11-30 DIAGNOSIS — Z7982 Long term (current) use of aspirin: Secondary | ICD-10-CM | POA: Insufficient documentation

## 2016-11-30 DIAGNOSIS — E7800 Pure hypercholesterolemia, unspecified: Secondary | ICD-10-CM

## 2016-11-30 DIAGNOSIS — F419 Anxiety disorder, unspecified: Secondary | ICD-10-CM

## 2016-11-30 DIAGNOSIS — I10 Essential (primary) hypertension: Secondary | ICD-10-CM

## 2016-11-30 LAB — POCT GLYCOSYLATED HEMOGLOBIN (HGB A1C): Hemoglobin A1C: 5.4

## 2016-11-30 LAB — GLUCOSE, POCT (MANUAL RESULT ENTRY): POC Glucose: 86 mg/dl (ref 70–99)

## 2016-11-30 MED ORDER — ASPIRIN EC 81 MG PO TBEC: 81.0000 mg | DELAYED_RELEASE_TABLET | Freq: Every day | ORAL | 3 refills | Status: DC

## 2016-11-30 MED ORDER — LOSARTAN POTASSIUM 100 MG PO TABS: 100.0000 mg | ORAL_TABLET | Freq: Every day | ORAL | 3 refills | Status: DC

## 2016-11-30 MED FILL — raNITIdine HCL 150 MG TABS: 150 | 30 days supply | Qty: 60 | Fill #2

## 2016-11-30 MED FILL — LOSARTAN POTASSIUM 100 MG T: 100 | 30 days supply | Qty: 30 | Fill #0

## 2016-11-30 NOTE — Assessment & Plan Note (Signed)
A; elevated BP Med: compliant P: Increase losartan to 100 mg daily Continue amlodipine 10 mg daily

## 2016-11-30 NOTE — Assessment & Plan Note (Signed)
A: hyperlipidemia intolerant of pravastatin P: Stop pravastatin Start aspirin 81 mg daily

## 2016-11-30 NOTE — Patient Instructions (Addendum)
Kayla Hashimotoatricia was seen today for hypertension.  Diagnoses and all orders for this visit:  Essential hypertension, benign -     Glucose (CBG) -     POCT glycosylated hemoglobin (Hb A1C) -     losartan (COZAAR) 100 MG tablet; Take 1 tablet (100 mg total) by mouth daily.  Pure hypercholesterolemia -     aspirin EC 81 MG tablet; Take 1 tablet (81 mg total) by mouth daily.  Anxiety  Essential hypertension  start an exercise program Look at your schedule and carve out weekly time for yourself   Stop pravastatin Start aspirin 81 mg daily  Your blood sugar is normal. There is no diabetes   F/u in 6 weeks for HTN and flu shot   Dr. Armen PickupFunches   Dr. Armen PickupFunches

## 2016-11-30 NOTE — Assessment & Plan Note (Signed)
Anxiety symptoms Advised lifestyle modifications Increase personal time and exercise

## 2016-11-30 NOTE — Progress Notes (Signed)
Subjective:  Patient ID: Kayla Lowe, female    DOB: 1955/12/30  Age: 61 y.o. MRN: [id number]  CC: Hypertension   HPI Kayla Lowe presents for    1. CHRONIC HYPERTENSION  Disease Monitoring  Blood pressure range: not checking   Chest pain: no   Dyspnea: no   Claudication: no   Medication compliance: yes  Medication Side Effects  Lightheadedness: no   Urinary frequency: no   Edema: yes, in feet only      Preventitive Healthcare:  Exercise: no   Diet Pattern: regular meals   Salt Restriction: yes   2. Anxiety: she report caring for 3 boys (age 39 and 63 yo twins). She works as a Optician, dispensing. She feels nervous. She denies irritability. She reports history of anxiety. She reports being on medication in her late 36s and early 43s.   3. Hyperlipidemia: she had leg cramps with Lipitor. She transitioned to pravastatin 10 mg and still has nighttime cramping.   Social History  Substance Use Topics  . Smoking status: Never Smoker  . Smokeless tobacco: Never Used  . Alcohol use No    Outpatient Medications Prior to Visit  Medication Sig Dispense Refill  . amLODipine (NORVASC) 10 MG tablet Take 1 tablet (10 mg total) by mouth every evening. 90 tablet 3  . cholecalciferol (VITAMIN D) 1000 UNITS tablet Take 1,000 Units by mouth every evening.    Marland Kitchen co-enzyme Q-10 50 MG capsule Take 100 mg by mouth daily after lunch.    . cyclobenzaprine (FLEXERIL) 10 MG tablet Take 1 tablet (10 mg total) by mouth at bedtime. 30 tablet 1  . fluticasone (FLONASE) 50 MCG/ACT nasal spray Place 2 sprays into both nostrils daily. 16 g 5  . loratadine (CLARITIN) 10 MG tablet Take 1 tablet (10 mg total) by mouth daily. 30 tablet 11  . losartan (COZAAR) 50 MG tablet Take 1 tablet (50 mg total) by mouth daily. 90 tablet 3  . Omega-3 Fatty Acids (FISH OIL) 1000 MG CAPS Take 1,000 mg by mouth every evening.    . pravastatin (PRAVACHOL) 10 MG tablet Take 1 tablet (10 mg total) by  mouth daily. 30 tablet 2  . ranitidine (ZANTAC) 150 MG tablet Take 1 tablet (150 mg total) by mouth 2 (two) times daily. 60 tablet 11   No facility-administered medications prior to visit.     ROS Review of Systems  Constitutional: Negative for chills and fever.  Eyes: Negative for visual disturbance.  Respiratory: Negative for shortness of breath.   Cardiovascular: Negative for chest pain.  Gastrointestinal: Negative for abdominal pain and blood in stool.  Musculoskeletal: Negative for arthralgias and back pain.  Skin: Negative for rash.  Allergic/Immunologic: Negative for immunocompromised state.  Neurological: Negative for dizziness and light-headedness.  Hematological: Negative for adenopathy. Does not bruise/bleed easily.  Psychiatric/Behavioral: Negative for dysphoric mood and suicidal ideas. The patient is nervous/anxious.     Objective:  BP (!) 140/92   Pulse 78   Temp 98.3 F (36.8 C) (Oral)   Ht 5\' 3"  (1.6 m)   Wt 203 lb 6.4 oz (92.3 kg)   SpO2 100%   BMI 36.03 kg/m   BP/Weight 11/30/2016 09/05/2016 08/31/2016  Systolic BP 140 130 140  Diastolic BP 92 84 76  Wt. (Lbs) 203.4 - 204.4  BMI 36.03 - 36.21   Wt Readings from Last 3 Encounters:  11/30/16 203 lb 6.4 oz (92.3 kg)  08/31/16 204 lb 6.4 oz (92.7 kg)  02/06/16 212 lb (  96.2 kg)     Physical Exam  Constitutional: She is oriented to person, place, and time. She appears well-developed and well-nourished. No distress.  HENT:  Head: Normocephalic and atraumatic.  Cardiovascular: Normal rate, regular rhythm, normal heart sounds and intact distal pulses.   Pulmonary/Chest: Effort normal and breath sounds normal.  Musculoskeletal: She exhibits edema (trace in feet ).  Neurological: She is alert and oriented to person, place, and time.  Skin: Skin is warm and dry. No rash noted.  Psychiatric: She has a normal mood and affect.   [facility] screen [facility] 08/31/2016 02/06/2016 05/03/2015  Decreased Interest 0 0 0   Down, Depressed, Hopeless 0 0 0  PHQ - 2 Score 0 0 0  Altered sleeping 0 0 -  Tired, decreased energy 0 0 -  Change in appetite 0 0 -  Feeling bad or failure about yourself  0 0 -  Trouble concentrating 0 0 -  Moving slowly or fidgety/restless 0 0 -  Suicidal thoughts 0 0 -  PHQ-9 Score 0 0 -   GAD 7 : Generalized Anxiety Score 08/31/2016 02/06/2016  Nervous, Anxious, on Edge 0 0  Control/stop worrying 0 0  Worry too much - different things 0 -  Trouble relaxing 0 0  Restless 0 0  Easily annoyed or irritable 0 0  Afraid - awful might happen 0 0  Total GAD 7 Score 0 -      Assessment & Plan:  Kayla Lowe was seen today for hypertension.  Diagnoses and all orders for this visit:  Essential hypertension, benign -     Glucose (CBG) -     POCT glycosylated hemoglobin (Hb A1C) -     losartan (COZAAR) 100 MG tablet; Take 1 tablet (100 mg total) by mouth daily.  Pure hypercholesterolemia -     aspirin EC 81 MG tablet; Take 1 tablet (81 mg total) by mouth daily.  Anxiety  Essential hypertension   No orders of the defined types were placed in this encounter.   Follow-up: Return in about 6 weeks (around 01/11/2017) for HTN and flu shot.   Dessa PhiJosalyn Latacha Texeira MD

## 2016-12-20 ENCOUNTER — Other Ambulatory Visit: Payer: Self-pay | Admitting: Family Medicine

## 2016-12-20 DIAGNOSIS — I10 Essential (primary) hypertension: Secondary | ICD-10-CM

## 2016-12-20 MED FILL — AMLODIPINE BESYLATE 10 MG T: 10 | 90 days supply | Qty: 90 | Fill #3

## 2017-01-11 MED FILL — LOSARTAN POTASSIUM 100 MG T: 100 | 90 days supply | Qty: 90 | Fill #1

## 2017-01-16 MED FILL — raNITIdine HCL 150 MG TABS: 150 | 30 days supply | Qty: 60 | Fill #3

## 2017-01-18 ENCOUNTER — Ambulatory Visit: Payer: Self-pay | Attending: Internal Medicine | Admitting: Internal Medicine

## 2017-01-18 ENCOUNTER — Encounter: Payer: Self-pay | Admitting: Internal Medicine

## 2017-01-18 VITALS — BP 137/84 | HR 69 | Temp 98.3°F | Resp 16 | Wt 204.2 lb

## 2017-01-18 DIAGNOSIS — E785 Hyperlipidemia, unspecified: Secondary | ICD-10-CM

## 2017-01-18 DIAGNOSIS — Z7982 Long term (current) use of aspirin: Secondary | ICD-10-CM | POA: Insufficient documentation

## 2017-01-18 DIAGNOSIS — E66812 Obesity, class 2: Secondary | ICD-10-CM

## 2017-01-18 DIAGNOSIS — Z6836 Body mass index (BMI) 36.0-36.9, adult: Secondary | ICD-10-CM | POA: Insufficient documentation

## 2017-01-18 DIAGNOSIS — I10 Essential (primary) hypertension: Secondary | ICD-10-CM

## 2017-01-18 DIAGNOSIS — E669 Obesity, unspecified: Secondary | ICD-10-CM | POA: Insufficient documentation

## 2017-01-18 DIAGNOSIS — F419 Anxiety disorder, unspecified: Secondary | ICD-10-CM | POA: Insufficient documentation

## 2017-01-18 DIAGNOSIS — H579 Unspecified disorder of eye and adnexa: Secondary | ICD-10-CM

## 2017-01-18 DIAGNOSIS — K219 Gastro-esophageal reflux disease without esophagitis: Secondary | ICD-10-CM | POA: Insufficient documentation

## 2017-01-18 DIAGNOSIS — Z23 Encounter for immunization: Secondary | ICD-10-CM

## 2017-01-18 DIAGNOSIS — H029 Unspecified disorder of eyelid: Secondary | ICD-10-CM | POA: Insufficient documentation

## 2017-01-18 MED ORDER — AMLODIPINE BESYLATE 10 MG PO TABS: 10.0000 mg | ORAL_TABLET | Freq: Every evening | ORAL | 3 refills | Status: DC

## 2017-01-18 NOTE — Progress Notes (Signed)
Patient ID: Kayla Lowe, female    DOB: 08-18-1955  MRN: [id number]  CC: re-establish and Hypertension   Subjective: Kayla Lowe is a 61 y.o. female who presents for chronic ds management. Last saw Funches 11/2016 Her concerns today include:  Pt with hx of HTN, HL, obesity, GERD and anxiety.   1. HL: did not tolerate Pravastatin and Lipitor even at lower doses.+ Leg cramps  2. HTN: compliant with meds -checks BP occasionally -no CP/SOB/HA/dizziness. LE edema sometimes.  -a lot veggie and fruits, chicken/fish.  Water most of time. Occasional sodas.  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 Has spot on side of RT eye: there over 3 yrs. No inc size and not bothersome or impairs vision -more conscious because other people's comments Patient Active Problem List   Diagnosis Date Noted  . Anxiety 11/30/2016  . 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 Other allergic rhinitis 11/20/2013  . Hyperlipidemia 09/10/2013  . Essential hypertension, benign 09/10/2013     Current Outpatient Prescriptions on File Prior to Visit  Medication Sig Dispense Refill  . aspirin EC 81 MG tablet Take 1 tablet (81 mg total) by mouth daily. 90 tablet 3  . cholecalciferol (VITAMIN D) 1000 UNITS tablet Take 1,000 Units by mouth every evening.    Marland Kitchen co-enzyme Q-10 50 MG capsule Take 100 mg by mouth daily after lunch.    . cyclobenzaprine (FLEXERIL) 10 MG tablet Take 1 tablet (10 mg total) by mouth at bedtime. 30 tablet 1  . fluticasone (FLONASE) 50 MCG/ACT nasal spray Place 2 sprays into both nostrils daily. 16 g 5  . loratadine (CLARITIN) 10 MG tablet Take 1 tablet (10 mg total) by mouth daily. 30 tablet 11  . losartan (COZAAR) 100 MG tablet Take 1 tablet (100 mg total) by mouth daily. 90 tablet 3  . Omega-3 Fatty Acids (FISH OIL) 1000 MG CAPS Take 1,000 mg by mouth every evening.    . ranitidine (ZANTAC) 150 MG tablet Take 1 tablet (150 mg total) by mouth 2 (two) times daily. 60  tablet 11   No current facility-administered medications on file prior to visit.     Allergies  Allergen Reactions  . Statins Other (See Comments)    myalgias    Social History   Social History  . Marital status: Legally Separated    Spouse name: N/A  . Number of children: N/A  . Years of education: N/A   Occupational History  . Not on file.   Social History Main Topics  . Smoking status: Never Smoker  . Smokeless tobacco: Never Used  . Alcohol use No  . Drug use: No  . Sexual activity: Not Currently   Other Topics Concern  . Not on file   Social History Narrative  . No narrative on file    Family History  Problem Relation Age of Onset  . Cancer Father   . Hypertension Brother   . Diabetes Brother   . Diabetes Paternal Grandfather   . Diabetes Paternal Uncle   . Colon cancer Neg Hx     Past Surgical History:  Procedure Laterality Date  . ABDOMINAL HYSTERECTOMY  1987    ROS: Review of Systems Negative except as stated above PHYSICAL EXAM: BP 137/84   Pulse 69   Temp 98.3 F (36.8 C) (Oral)   Resp 16   Wt 204 lb 3.2 oz (92.6 kg)   SpO2 98%   BMI 36.17 kg/m   Physical Exam  General appearance -  alert, well appearing, and in no distress Mental status - alert, oriented to person, place, and time, normal mood, behavior, speech, dress, motor activity, and thought processes Eyes - pupils equal and reactive, extraocular eye movements intact Neck - supple, no significant adenopathy Chest - clear to auscultation, no wheezes, rales or rhonchi, symmetric air entry Heart - normal rate, regular rhythm, normal S1, S2, no murmurs, rubs, clicks or gallops Extremities - peripheral pulses normal, no pedal edema, no clubbing or cyanosis Skin: 1/2 cm pea size, soft lesion lateral canthus RT eye  Depression screen PHQ 2/9 01/18/2017  Decreased Interest 0  Down, Depressed, Hopeless 0  PHQ - 2 Score 0  Altered sleeping -  Tired, decreased energy -  Change in  appetite -  Feeling bad or failure about yourself  -  Trouble concentrating -  Moving slowly or fidgety/restless -  Suicidal thoughts -  PHQ-9 Score -   Lab Results  Component Value Date   CHOL 237 (H) 08/03/2015   HDL 54 08/03/2015   LDLCALC 162 (H) 08/03/2015   LDLDIRECT 159 (H) 09/05/2016   TRIG 105 08/03/2015   CHOLHDL 4.4 08/03/2015     Chemistry      Component Value Date/Time   NA 142 09/05/2016 0838   K 4.4 09/05/2016 0838   CL 103 09/05/2016 0838   CO2 24 09/05/2016 0838   BUN 11 09/05/2016 0838   CREATININE 0.79 09/05/2016 0838   CREATININE 0.87 08/03/2015 0942      Component Value Date/Time   CALCIUM 10.3 09/05/2016 0838   ALKPHOS 131 (H) 09/05/2016 0838   AST 16 09/05/2016 0838   ALT 13 09/05/2016 0838   BILITOT 0.5 09/05/2016 0838     CBC    Component Value Date/Time   WBC 6.1 07/17/2013 1740   RBC 4.50 07/17/2013 1740   HGB 13.7 07/17/2013 1740   HCT 40.4 07/17/2013 1740   PLT 383 07/17/2013 1740   MCV 89.8 07/17/2013 1740   MCH 30.4 07/17/2013 1740   MCHC 33.9 07/17/2013 1740   RDW 14.6 07/17/2013 1740   LYMPHSABS 1.8 07/10/2013 0855   MONOABS 0.3 07/10/2013 0855   EOSABS 0.1 07/10/2013 0855   BASOSABS 0.0 07/10/2013 0855     ASSESSMENT AND PLAN: 1. Essential hypertension -at goal. Continue current meds - amLODipine (NORVASC) 10 MG tablet; Take 1 tablet (10 mg total) by mouth every evening.  Dispense: 90 tablet; Refill: 3  2. Hyperlipidemia, unspecified hyperlipidemia type -pt thinks she still has some 10 mg tabs of Pravachol at home.  Would recommend that she try taking 1 Q M/W/F to see if she tolerates  3. Eye lesion -observe.  If changes ie inc size, color or limits vision can refer for removal  4. Class 2 obesity without serious comorbidity with body mass index (BMI) of 36.0 to 36.9 in adult, unspecified obesity type Continue healthy eating and regular exeercise  5. Need for influenza vaccination - Flu Vaccine QUAD 6+ mos PF IM  (Fluarix Quad PF)  Patient was given the opportunity to ask questions.  Patient verbalized understanding of the plan and was able to repeat key elements of the plan.   Orders Placed This Encounter  Procedures  . Flu Vaccine QUAD 6+ mos PF IM (Fluarix Quad PF)     Requested Prescriptions   Signed Prescriptions Disp Refills  . amLODipine (NORVASC) 10 MG tablet 90 tablet 3    Sig: Take 1 tablet (10 mg total) by mouth every evening.  Return in about 3 months (around 04/19/2017).  Jonah Blueeborah Loralyn Rachel, MD, FACP

## 2017-01-18 NOTE — Patient Instructions (Addendum)
If you still have the 10 mg tablets of Pravastatin or Atorvastatin, try taking it every Monday, Wednesday and Friday.   Continue to monitor the pea size lesion on the right eye.  If it increases in size or obstructs your vision, please follow-up.  nfluenza Virus Vaccine injection (Fluarix) What is this medicine? INFLUENZA VIRUS VACCINE (in floo EN zuh VAHY ruhs vak SEEN) helps to reduce the risk of getting influenza also known as the flu. This medicine may be used for other purposes; ask your health care provider or pharmacist if you have questions. COMMON BRAND NAME(S): Fluarix, Fluzone What should I tell my health care provider before I take this medicine? They need to know if you have any of these conditions: -bleeding disorder like hemophilia -fever or infection -Guillain-Barre syndrome or other neurological problems -immune system problems -infection with the human immunodeficiency virus (HIV) or AIDS -low blood platelet counts -multiple sclerosis -an unusual or allergic reaction to influenza virus vaccine, eggs, chicken proteins, latex, gentamicin, other medicines, foods, dyes or preservatives -pregnant or trying to get pregnant -breast-feeding How should I use this medicine? This vaccine is for injection into a muscle. It is given by a health care professional. A copy of Vaccine Information Statements will be given before each vaccination. Read this sheet carefully each time. The sheet may change frequently. Talk to your pediatrician regarding the use of this medicine in children. Special care may be needed. Overdosage: If you think you have taken too much of this medicine contact a poison control center or emergency room at once. NOTE: This medicine is only for you. Do not share this medicine with others. What if I miss a dose? This does not apply. What may interact with this medicine? -chemotherapy or radiation therapy -medicines that lower your immune system like etanercept,  anakinra, infliximab, and adalimumab -medicines that treat or prevent blood clots like warfarin -phenytoin -steroid medicines like prednisone or cortisone -theophylline -vaccines This list may not describe all possible interactions. Give your health care provider a list of all the medicines, herbs, non-prescription drugs, or dietary supplements you use. Also tell them if you smoke, drink alcohol, or use illegal drugs. Some items may interact with your medicine. What should I watch for while using this medicine? Report any side effects that do not go away within 3 days to your doctor or health care professional. Call your health care provider if any unusual symptoms occur within 6 weeks of receiving this vaccine. You may still catch the flu, but the illness is not usually as bad. You cannot get the flu from the vaccine. The vaccine will not protect against colds or other illnesses that may cause fever. The vaccine is needed every year. What side effects may I notice from receiving this medicine? Side effects that you should report to your doctor or health care professional as soon as possible: -allergic reactions like skin rash, itching or hives, swelling of the face, lips, or tongue Side effects that usually do not require medical attention (report to your doctor or health care professional if they continue or are bothersome): -fever -headache -muscle aches and pains -pain, tenderness, redness, or swelling at site where injected -weak or tired This list may not describe all possible side effects. Call your doctor for medical advice about side effects. You may report side effects to FDA at 1-800-FDA-1088. Where should I keep my medicine? This vaccine is only given in a clinic, pharmacy, doctor's office, or other health care setting and  will not be stored at home. NOTE: This sheet is a summary. It may not cover all possible information. If you have questions about this medicine, talk to your  doctor, pharmacist, or health care provider.  2018 Elsevier/Gold Standard (2007-11-26 09:30:40)

## 2017-01-22 MED FILL — PRAVASTATIN SODIUM 10 MG TA: 10 | 30 days supply | Qty: 30 | Fill #1

## 2017-03-22 MED FILL — raNITIdine HCL 150 MG TABS: 150 | 30 days supply | Qty: 60 | Fill #4

## 2017-03-22 MED FILL — PRAVASTATIN SODIUM 10 MG TA: 10 | 30 days supply | Qty: 30 | Fill #2

## 2017-04-02 MED FILL — AMLODIPINE BESYLATE 10 MG T: 10 | 30 days supply | Qty: 30 | Fill #0

## 2017-04-02 MED FILL — FLUTICASONE PROP 50 MCG SPR: 50 | 30 days supply | Qty: 16 | Fill #2

## 2017-04-22 ENCOUNTER — Ambulatory Visit: Payer: Self-pay | Admitting: Internal Medicine

## 2017-04-30 ENCOUNTER — Encounter: Payer: Self-pay | Admitting: Internal Medicine

## 2017-04-30 ENCOUNTER — Ambulatory Visit: Payer: Self-pay | Attending: Internal Medicine | Admitting: Internal Medicine

## 2017-04-30 VITALS — BP 144/83 | HR 81 | Temp 98.3°F | Resp 16 | Wt 210.8 lb

## 2017-04-30 DIAGNOSIS — Z79899 Other long term (current) drug therapy: Secondary | ICD-10-CM | POA: Insufficient documentation

## 2017-04-30 DIAGNOSIS — K219 Gastro-esophageal reflux disease without esophagitis: Secondary | ICD-10-CM | POA: Insufficient documentation

## 2017-04-30 DIAGNOSIS — Z90710 Acquired absence of both cervix and uterus: Secondary | ICD-10-CM | POA: Insufficient documentation

## 2017-04-30 DIAGNOSIS — Z7982 Long term (current) use of aspirin: Secondary | ICD-10-CM | POA: Insufficient documentation

## 2017-04-30 DIAGNOSIS — Z888 Allergy status to other drugs, medicaments and biological substances status: Secondary | ICD-10-CM | POA: Insufficient documentation

## 2017-04-30 DIAGNOSIS — I10 Essential (primary) hypertension: Secondary | ICD-10-CM | POA: Insufficient documentation

## 2017-04-30 DIAGNOSIS — E785 Hyperlipidemia, unspecified: Secondary | ICD-10-CM | POA: Insufficient documentation

## 2017-04-30 DIAGNOSIS — Z833 Family history of diabetes mellitus: Secondary | ICD-10-CM | POA: Insufficient documentation

## 2017-04-30 DIAGNOSIS — Z6836 Body mass index (BMI) 36.0-36.9, adult: Secondary | ICD-10-CM | POA: Insufficient documentation

## 2017-04-30 DIAGNOSIS — E669 Obesity, unspecified: Secondary | ICD-10-CM | POA: Insufficient documentation

## 2017-04-30 DIAGNOSIS — Z809 Family history of malignant neoplasm, unspecified: Secondary | ICD-10-CM | POA: Insufficient documentation

## 2017-04-30 DIAGNOSIS — Z8249 Family history of ischemic heart disease and other diseases of the circulatory system: Secondary | ICD-10-CM | POA: Insufficient documentation

## 2017-04-30 MED ORDER — PRAVASTATIN SODIUM 10 MG PO TABS: ORAL_TABLET | ORAL | 6 refills | Status: DC

## 2017-04-30 MED FILL — LOSARTAN POTASSIUM 100 MG T: 100 | 90 days supply | Qty: 90 | Fill #2

## 2017-04-30 MED FILL — PRAVASTATIN SODIUM 10 MG TA: 10 | 28 days supply | Qty: 12 | Fill #0

## 2017-04-30 MED FILL — AMLODIPINE BESYLATE 10 MG T: 10 | 30 days supply | Qty: 30 | Fill #1

## 2017-04-30 MED FILL — raNITIdine HCL 150 MG TABS: 150 | 30 days supply | Qty: 60 | Fill #5

## 2017-04-30 NOTE — Patient Instructions (Signed)

## 2017-04-30 NOTE — Progress Notes (Signed)
Patient ID: Kayla Lowe, female    DOB: Nov 02, 1955  MRN: [id number]  CC: Hypertension   Subjective: Kayla Lowe is a 61 y.o. female who presents for chronic ds management Her concerns today include:  Pt with hx of HTN, HL, obesity, GERD and anxiety.   1.  HTN:  Checks BP periodically. Reports range 130s/70s.  Highest was 145/83 -limits salt -does yoga once a wk and walks 1 mile a few days a wk but not since thanksgiving -eating habits: admits to eating a lot of sweets during the holidays -no CP/SOB/HA   2.  HL: taking the Pravachol QOD as I suggested. Doing well with less cramps. Request RF on it.  Patient Active Problem List   Diagnosis Date Noted  . Anxiety 11/30/2016  . 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 Other allergic rhinitis 11/20/2013  . Hyperlipidemia 09/10/2013  . Essential hypertension, benign 09/10/2013     Current Outpatient Medications on File Prior to Visit  Medication Sig Dispense Refill  . amLODipine (NORVASC) 10 MG tablet Take 1 tablet (10 mg total) by mouth every evening. 90 tablet 3  . aspirin EC 81 MG tablet Take 1 tablet (81 mg total) by mouth daily. 90 tablet 3  . cholecalciferol (VITAMIN D) 1000 UNITS tablet Take 1,000 Units by mouth every evening.    Marland Kitchen. co-enzyme Q-10 50 MG capsule Take 100 mg by mouth daily after lunch.    . cyclobenzaprine (FLEXERIL) 10 MG tablet Take 1 tablet (10 mg total) by mouth at bedtime. 30 tablet 1  . fluticasone (FLONASE) 50 MCG/ACT nasal spray Place 2 sprays into both nostrils daily. 16 g 5  . loratadine (CLARITIN) 10 MG tablet Take 1 tablet (10 mg total) by mouth daily. 30 tablet 11  . losartan (COZAAR) 100 MG tablet Take 1 tablet (100 mg total) by mouth daily. 90 tablet 3  . Omega-3 Fatty Acids (FISH OIL) 1000 MG CAPS Take 1,000 mg by mouth every evening.    . ranitidine (ZANTAC) 150 MG tablet Take 1 tablet (150 mg total) by mouth 2 (two) times daily. 60 tablet 11   No current  facility-administered medications on file prior to visit.     Allergies  Allergen Reactions  . Statins Other (See Comments)    myalgias    Social History   Socioeconomic History  . Marital status: Legally Separated    Spouse name: Not on file  . Number of children: Not on file  . Years of education: Not on file  . Highest education level: Not on file  Social Needs  . Financial resource strain: Not on file  . Food insecurity - worry: Not on file  . Food insecurity - inability: Not on file  . Transportation needs - medical: Not on file  . Transportation needs - non-medical: Not on file  Occupational History  . Not on file  Tobacco Use  . Smoking status: Never Smoker  . Smokeless tobacco: Never Used  Substance and Sexual Activity  . Alcohol use: No  . Drug use: No  . Sexual activity: Not Currently  Other Topics Concern  . Not on file  Social History Narrative  . Not on file    Family History  Problem Relation Age of Onset  . Cancer Father   . Hypertension Brother   . Diabetes Brother   . Diabetes Paternal Grandfather   . Diabetes Paternal Uncle   . Colon cancer Neg Hx     Past Surgical History:  Procedure Laterality Date  . ABDOMINAL HYSTERECTOMY  1987    ROS: Review of Systems Neg except as stated above PHYSICAL EXAM: BP (!) 144/83   Pulse 81   Temp 98.3 F (36.8 C) (Oral)   Resp 16   Wt 210 lb 12.8 oz (95.6 kg)   SpO2 97%   BMI 37.34 kg/m   Wt Readings from Last 3 Encounters:  04/30/17 210 lb 12.8 oz (95.6 kg)  01/18/17 204 lb 3.2 oz (92.6 kg)  11/30/16 203 lb 6.4 oz (92.3 kg)  Repeat BP 140/82  Physical Exam  General appearance - alert, well appearing, older AAF and in no distress Mental status - alert, oriented to person, place, and time, normal mood, behavior, speech, dress, motor activity, and thought processes Neck - supple, no significant adenopathy Chest - clear to auscultation, no wheezes, rales or rhonchi, symmetric air entry Heart  - normal rate, regular rhythm, normal S1, S2, no murmurs, rubs, clicks or gallops Extremities - peripheral pulses normal, no pedal edema, no clubbing or cyanosis     Chemistry      Component Value Date/Time   NA 142 09/05/2016 0838   K 4.4 09/05/2016 0838   CL 103 09/05/2016 0838   CO2 24 09/05/2016 0838   BUN 11 09/05/2016 0838   CREATININE 0.79 09/05/2016 0838   CREATININE 0.87 08/03/2015 0942      Component Value Date/Time   CALCIUM 10.3 09/05/2016 0838   ALKPHOS 131 (H) 09/05/2016 0838   AST 16 09/05/2016 0838   ALT 13 09/05/2016 0838   BILITOT 0.5 09/05/2016 0838      ASSESSMENT AND PLAN: 1. Essential hypertension At goal. Continue Cozaar and amlodipine  2. Hyperlipidemia, unspecified hyperlipidemia type Continue Pravachol taking it 3 times a week.  3. Class 2 obesity without serious comorbidity with body mass index (BMI) of 36.0 to 36.9 in adult, unspecified obesity type -pt encouraged to start walking again and to be mindful of over eating and snacking too much during the holidays Patient was given the opportunity to ask questions.  Patient verbalized understanding of the plan and was able to repeat key elements of the plan.   Routine labs to be done on next visit  No orders of the defined types were placed in this encounter.    Requested Prescriptions   Pending Prescriptions Disp Refills  . pravastatin (PRAVACHOL) 10 MG tablet 30 tablet 6    Sig: Take one tab Q Mon/Wed/Fri    F/u in 3 mth Jonah Blueeborah Oris Staffieri, MD, Jerrel IvoryFACP

## 2017-06-24 MED FILL — raNITIdine HCL 150 MG TABS: 150 | 30 days supply | Qty: 60 | Fill #6

## 2017-06-24 MED FILL — AMLODIPINE BESYLATE 10 MG T: 10 | 30 days supply | Qty: 30 | Fill #2

## 2017-07-05 MED FILL — PRAVASTATIN SODIUM 10 MG TA: 10 | 28 days supply | Qty: 12 | Fill #1

## 2017-08-05 MED FILL — PRAVASTATIN SODIUM 10 MG TA: 10 | 28 days supply | Qty: 12 | Fill #2

## 2017-08-05 MED FILL — raNITIdine HCL 150 MG TABS: 150 | 30 days supply | Qty: 60 | Fill #7

## 2017-08-05 MED FILL — AMLODIPINE BESYLATE 10 MG T: 10 | 30 days supply | Qty: 30 | Fill #3

## 2017-08-09 MED FILL — LOSARTAN POTASSIUM 100 MG T: 100 | 90 days supply | Qty: 90 | Fill #3

## 2017-08-12 ENCOUNTER — Encounter: Payer: Self-pay | Admitting: Internal Medicine

## 2017-08-12 ENCOUNTER — Ambulatory Visit: Payer: Self-pay | Attending: Internal Medicine | Admitting: Internal Medicine

## 2017-08-12 ENCOUNTER — Ambulatory Visit: Payer: Self-pay | Attending: Internal Medicine

## 2017-08-12 VITALS — BP 153/77 | HR 71 | Temp 98.2°F | Resp 16 | Ht 63.5 in | Wt 211.0 lb

## 2017-08-12 DIAGNOSIS — K219 Gastro-esophageal reflux disease without esophagitis: Secondary | ICD-10-CM | POA: Insufficient documentation

## 2017-08-12 DIAGNOSIS — J302 Other seasonal allergic rhinitis: Secondary | ICD-10-CM | POA: Insufficient documentation

## 2017-08-12 DIAGNOSIS — I10 Essential (primary) hypertension: Secondary | ICD-10-CM

## 2017-08-12 DIAGNOSIS — Z833 Family history of diabetes mellitus: Secondary | ICD-10-CM | POA: Insufficient documentation

## 2017-08-12 DIAGNOSIS — Z79899 Other long term (current) drug therapy: Secondary | ICD-10-CM | POA: Insufficient documentation

## 2017-08-12 DIAGNOSIS — Z90710 Acquired absence of both cervix and uterus: Secondary | ICD-10-CM | POA: Insufficient documentation

## 2017-08-12 DIAGNOSIS — Z6836 Body mass index (BMI) 36.0-36.9, adult: Secondary | ICD-10-CM

## 2017-08-12 DIAGNOSIS — Z888 Allergy status to other drugs, medicaments and biological substances status: Secondary | ICD-10-CM | POA: Insufficient documentation

## 2017-08-12 DIAGNOSIS — J301 Allergic rhinitis due to pollen: Secondary | ICD-10-CM | POA: Insufficient documentation

## 2017-08-12 DIAGNOSIS — F419 Anxiety disorder, unspecified: Secondary | ICD-10-CM | POA: Insufficient documentation

## 2017-08-12 DIAGNOSIS — Z809 Family history of malignant neoplasm, unspecified: Secondary | ICD-10-CM | POA: Insufficient documentation

## 2017-08-12 DIAGNOSIS — Z8249 Family history of ischemic heart disease and other diseases of the circulatory system: Secondary | ICD-10-CM | POA: Insufficient documentation

## 2017-08-12 DIAGNOSIS — Z7982 Long term (current) use of aspirin: Secondary | ICD-10-CM | POA: Insufficient documentation

## 2017-08-12 DIAGNOSIS — E669 Obesity, unspecified: Secondary | ICD-10-CM | POA: Insufficient documentation

## 2017-08-12 MED ORDER — HYDROCHLOROTHIAZIDE 12.5 MG PO CAPS: 12.5000 mg | ORAL_CAPSULE | Freq: Every day | ORAL | 6 refills | Status: DC

## 2017-08-12 MED ORDER — CETIRIZINE HCL 10 MG PO TABS: 10.0000 mg | ORAL_TABLET | Freq: Every day | ORAL | 6 refills | Status: DC

## 2017-08-12 MED FILL — HYDROCHLOROTHIAZIDE 12.5 MG: 12.5 | 30 days supply | Qty: 30 | Fill #0

## 2017-08-12 NOTE — Progress Notes (Signed)
Patient ID: Kayla Lowe, female    DOB: [birth year]  MRN: [id number]  CC: Hypertension   Subjective: Kayla Lowe is a 62 y.o. female who presents for chronic ds management Her concerns today include:  Pt with hx of HTN, HL, obesity, GERD and anxiety.   1.  Obesity:  Admit that she has not been active. However, she has set goals over past few wks.  Wants to lose 40 lbs in the next 6 mths. Started yoga 3 x a wk.   Has a gym in the apartment complex where she lives.  -not eating as much.  Eating more salads; baking meats. Drinking water and sweet tea.   2.  HTN: checks BP 4 x a wk.  Lowest 138/67. Sometimes SBP in the 140s -limits salt No CP/SOB/LE edema  3. Started having some allergy symptoms.  Forgot to start taking Flonase around January of this yr which is when she usually does in anticipation of spring Taking Claritin daily. Feels it is not working any more. Took some OTC decongestant which helped but does not contain Sudafed. Also has a [employer] Pot which she uses intermittently.   Patient Active Problem List   Diagnosis Date Noted  . Anxiety 11/30/2016  . 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 Other allergic rhinitis 11/20/2013  . Hyperlipidemia 09/10/2013  . Essential hypertension, benign 09/10/2013     Current Outpatient Medications on File Prior to Visit  Medication Sig Dispense Refill  . amLODipine (NORVASC) 10 MG tablet Take 1 tablet (10 mg total) by mouth every evening. 90 tablet 3  . aspirin EC 81 MG tablet Take 1 tablet (81 mg total) by mouth daily. 90 tablet 3  . cholecalciferol (VITAMIN D) 1000 UNITS tablet Take 1,000 Units by mouth every evening.    Marland Kitchen. co-enzyme Q-10 50 MG capsule Take 100 mg by mouth daily after lunch.    . cyclobenzaprine (FLEXERIL) 10 MG tablet Take 1 tablet (10 mg total) by mouth at bedtime. 30 tablet 1  . fluticasone (FLONASE) 50 MCG/ACT nasal spray Place 2 sprays into both nostrils daily. 16 g 5  . loratadine  (CLARITIN) 10 MG tablet Take 1 tablet (10 mg total) by mouth daily. 30 tablet 11  . losartan (COZAAR) 100 MG tablet Take 1 tablet (100 mg total) by mouth daily. 90 tablet 3  . Omega-3 Fatty Acids (FISH OIL) 1000 MG CAPS Take 1,000 mg by mouth every evening.    . pravastatin (PRAVACHOL) 10 MG tablet Take one tab Q Mon/Wed/Fri 30 tablet 6  . ranitidine (ZANTAC) 150 MG tablet Take 1 tablet (150 mg total) by mouth 2 (two) times daily. 60 tablet 11   No current facility-administered medications on file prior to visit.     Allergies  Allergen Reactions  . Statins Other (See Comments)    myalgias    Social History   Socioeconomic History  . Marital status: Legally Separated    Spouse name: Not on file  . Number of children: Not on file  . Years of education: Not on file  . Highest education level: Not on file  Occupational History  . Not on file  Social Needs  . Financial resource strain: Not on file  . Food insecurity:    Worry: Not on file    Inability: Not on file  . Transportation needs:    Medical: Not on file    Non-medical: Not on file  Tobacco Use  . Smoking status: Never Smoker  .  Smokeless tobacco: Never Used  Substance and Sexual Activity  . Alcohol use: No  . Drug use: No  . Sexual activity: Not Currently  Lifestyle  . Physical activity:    Days per week: Not on file    Minutes per session: Not on file  . Stress: Not on file  Relationships  . Social connections:    Talks on phone: Not on file    Gets together: Not on file    Attends religious service: Not on file    Active member of club or organization: Not on file    Attends meetings of clubs or organizations: Not on file    Relationship status: Not on file  . Intimate partner violence:    Fear of current or ex partner: Not on file    Emotionally abused: Not on file    Physically abused: Not on file    Forced sexual activity: Not on file  Other Topics Concern  . Not on file  Social History Narrative    . Not on file    Family History  Problem Relation Age of Onset  . Cancer Father   . Hypertension Brother   . Diabetes Brother   . Diabetes Paternal Grandfather   . Diabetes Paternal Uncle   . Colon cancer Neg Hx     Past Surgical History:  Procedure Laterality Date  . ABDOMINAL HYSTERECTOMY  1987    ROS: Review of Systems Neg except as stated above  PHYSICAL EXAM: BP (!) 153/77   Pulse 71   Temp 98.2 F (36.8 C) (Oral)   Resp 16   Ht 5' 3.5" (1.613 m)   Wt 211 lb (95.7 kg)   SpO2 98%   BMI 36.79 kg/m   Wt Readings from Last 3 Encounters:  08/12/17 211 lb (95.7 kg)  04/30/17 210 lb 12.8 oz (95.6 kg)  01/18/17 204 lb 3.2 oz (92.6 kg)   Repeat BP 150/90  Physical Exam  General appearance - alert, well appearing, and in no distress Mental status - alert, oriented to person, place, and time, normal mood, behavior, speech, dress, motor activity, and thought processes Eyes -pink conjunctiva  nose - normal and patent, no erythema, discharge or polyps Mouth - mucous membranes moist, pharynx normal without lesions Neck - supple, no significant adenopathy Chest - clear to auscultation, no wheezes, rales or rhonchi, symmetric air entry Heart - normal rate, regular rhythm, normal S1, S2, no murmurs, rubs, clicks or gallops Extremities - peripheral pulses normal, no pedal edema, no clubbing or cyanosis  ASSESSMENT AND PLAN: 1. Essential hypertension Not at goal.  Add HCTZ.  Return to the lab in 1 week for blood tests including CMP - CBC; Future - Comprehensive metabolic panel; Future - hydrochlorothiazide (MICROZIDE) 12.5 MG capsule; Take 1 capsule (12.5 mg total) by mouth daily.  Dispense: 30 capsule; Refill: 6  2. Seasonal allergic rhinitis due to pollen Change Claritin to Zyrtec.  Continue Flonase as needed.  Okay to continue using Nettie pot as needed - cetirizine (ZYRTEC) 10 MG tablet; Take 1 tablet (10 mg total) by mouth daily.  Dispense: 30 tablet; Refill:  6  3. Class 2 obesity without serious comorbidity with body mass index (BMI) of 36.0 to 36.9 in adult, unspecified obesity type Commended her on setting goal.  Discussed healthy eating habits in detail.  Advised that she avoid sweet tea.  If she drinks orange juice [employer] sells a low calorie brand.  Cut back on white carbohydrates. -Commended  her on starting yoga.  Also encourage some form of aerobic exercise at least 3 days a week for 30 minutes  Patient was given the opportunity to ask questions.  Patient verbalized understanding of the plan and was able to repeat key elements of the plan.   No orders of the defined types were placed in this encounter.    Requested Prescriptions    No prescriptions requested or ordered in this encounter    No follow-ups on file.  Jonah Blue, MD, FACP

## 2017-08-12 NOTE — Patient Instructions (Addendum)
Follow a Healthy Eating Plan - You can do it! Limit sugary drinks.  Avoid sodas, sweet tea, sport or energy drinks, or fruit drinks.  Drink water, lo-fat milk, or diet drinks. Limit snack foods.   Cut back on candy, cake, cookies, chips, ice cream.  These are a special treat, only in small amounts. Eat plenty of vegetables.  Especially dark green, red, and orange vegetables. Aim for at least 3 servings a day. More is better! Include fruit in your daily diet.  Whole fruit is much healthier than fruit juice! Limit "white" bread, "white" pasta, "white" rice.   Choose "100% whole grain" products, brown or wild rice. Avoid fatty meats. Try "Meatless Monday" and choose eggs or beans one day a week.  When eating meat, choose lean meats like chicken, [place], and fish.  Grill, broil, or bake meats instead of frying, and eat poultry without the skin. Eat less salt.  Avoid frozen pizzas, frozen dinners and salty foods.  Use seasonings other than salt in cooking.  This can help blood pressure and keep you from swelling Beer, wine and liquor have calories.  If you can safely drink alcohol, limit to 1 drink per day for women, 2 drinks for men  Your blood pressure goal is 130/80 or lower.  Continue to monitor blood pressure.  Start Hydrochlorothiazide.  Allergic Rhinitis, Adult Allergic rhinitis is an allergic reaction that affects the mucous membrane inside the nose. It causes sneezing, a runny or stuffy nose, and the feeling of mucus going down the back of the throat (postnasal drip). Allergic rhinitis can be mild to severe. There are two types of allergic rhinitis:  Seasonal. This type is also called hay fever. It happens only during certain seasons.  Perennial. This type can happen at any time of the year.  What are the causes? This condition happens when the body's defense system (immune system) responds to certain harmless substances called allergens as though they were germs.  Seasonal allergic  rhinitis is triggered by pollen, which can come from grasses, trees, and weeds. Perennial allergic rhinitis may be caused by:  House dust mites.  Pet dander.  Mold spores.  What are the signs or symptoms? Symptoms of this condition include:  Sneezing.  Runny or stuffy nose (nasal congestion).  Postnasal drip.  Itchy nose.  Tearing of the eyes.  Trouble sleeping.  Daytime sleepiness.  How is this diagnosed? This condition may be diagnosed based on:  Your medical history.  A physical exam.  Tests to check for related conditions, such as: ? Asthma. ? Pink eye. ? Ear infection. ? Upper respiratory infection.  Tests to find out which allergens trigger your symptoms. These may include skin or blood tests.  How is this treated? There is no cure for this condition, but treatment can help control symptoms. Treatment may include:  Taking medicines that block allergy symptoms, such as antihistamines. Medicine may be given as a shot, nasal spray, or pill.  Avoiding the allergen.  Desensitization. This treatment involves getting ongoing shots until your body becomes less sensitive to the allergen. This treatment may be done if other treatments do not help.  If taking medicine and avoiding the allergen does not work, new, stronger medicines may be prescribed.  Follow these instructions at home:  Find out what you are allergic to. Common allergens include smoke, dust, and pollen.  Avoid the things you are allergic to. These are some things you can do to help avoid allergens: ? Replace  carpet with wood, tile, or vinyl flooring. Carpet can trap dander and dust. ? Do not smoke. Do not allow smoking in your home. ? Change your heating and air conditioning filter at least once a month. ? During allergy season:  Keep windows closed as much as possible.  Plan outdoor activities when pollen counts are lowest. This is usually during the evening hours.  When coming indoors,  change clothing and shower before sitting on furniture or bedding.  Take over-the-counter and prescription medicines only as told by your health care provider.  Keep all follow-up visits as told by your health care provider. This is important. Contact a health care provider if:  You have a fever.  You develop a persistent cough.  You make whistling sounds when you breathe (you wheeze).  Your symptoms interfere with your normal daily activities. Get help right away if:  You have shortness of breath. Summary  This condition can be managed by taking medicines as directed and avoiding allergens.  Contact your health care provider if you develop a persistent cough or fever.  During allergy season, keep windows closed as much as possible. This information is not intended to replace advice given to you by your health care provider. Make sure you discuss any questions you have with your health care provider. Document Released: 01/23/2001 Document Revised: 06/07/2016 Document Reviewed: 06/07/2016 Elsevier Interactive Patient Education  [employer]2018 [employer]

## 2017-09-02 MED FILL — PRAVASTATIN SODIUM 10 MG TA: 10 | 28 days supply | Qty: 12 | Fill #3

## 2017-09-03 ENCOUNTER — Ambulatory Visit: Payer: Self-pay | Attending: Internal Medicine

## 2017-09-03 DIAGNOSIS — I10 Essential (primary) hypertension: Secondary | ICD-10-CM | POA: Insufficient documentation

## 2017-09-03 NOTE — Progress Notes (Signed)
Patient here for lab visit only 

## 2017-09-04 LAB — COMPREHENSIVE METABOLIC PANEL
ALBUMIN: 4.1 g/dL (ref 3.6–4.8)
ALK PHOS: 116 IU/L (ref 39–117)
ALT: 15 IU/L (ref 0–32)
AST: 18 IU/L (ref 0–40)
Albumin/Globulin Ratio: 1.2 (ref 1.2–2.2)
BILIRUBIN TOTAL: 0.3 mg/dL (ref 0.0–1.2)
BUN / CREAT RATIO: 11 — AB (ref 12–28)
BUN: 11 mg/dL (ref 8–27)
CHLORIDE: 102 mmol/L (ref 96–106)
CO2: 24 mmol/L (ref 20–29)
CREATININE: 1.02 mg/dL — AB (ref 0.57–1.00)
Calcium: 10.5 mg/dL — ABNORMAL HIGH (ref 8.7–10.3)
GFR calc Af Amer: 67 mL/min/{1.73_m2} (ref >59)
GFR calc non Af Amer: 58 mL/min/{1.73_m2} — ABNORMAL LOW (ref >59)
GLUCOSE: 87 mg/dL (ref 65–99)
Globulin, Total: 3.5 g/dL (ref 1.5–4.5)
Potassium: 4.3 mmol/L (ref 3.5–5.2)
Sodium: 142 mmol/L (ref 134–144)
Total Protein: 7.6 g/dL (ref 6.0–8.5)

## 2017-09-04 LAB — CBC
HEMOGLOBIN: 13.2 g/dL (ref 11.1–15.9)
Hematocrit: 40.8 % (ref 34.0–46.6)
MCH: 30.8 pg (ref 26.6–33.0)
MCHC: 32.4 g/dL (ref 31.5–35.7)
MCV: 95 fL (ref 79–97)
PLATELETS: 324 10*3/uL (ref 150–379)
RBC: 4.28 x10E6/uL (ref 3.77–5.28)
RDW: 14.7 % (ref 12.3–15.4)
WBC: 6.6 10*3/uL (ref 3.4–10.8)

## 2017-09-06 ENCOUNTER — Other Ambulatory Visit: Payer: Self-pay | Admitting: Internal Medicine

## 2017-09-11 ENCOUNTER — Other Ambulatory Visit (INDEPENDENT_AMBULATORY_CARE_PROVIDER_SITE_OTHER): Payer: Self-pay

## 2017-09-11 DIAGNOSIS — K219 Gastro-esophageal reflux disease without esophagitis: Secondary | ICD-10-CM

## 2017-09-11 DIAGNOSIS — J3089 Other allergic rhinitis: Secondary | ICD-10-CM

## 2017-09-11 MED ORDER — RANITIDINE HCL 150 MG PO TABS: 150.0000 mg | ORAL_TABLET | Freq: Two times a day (BID) | ORAL | 2 refills | Status: DC

## 2017-09-11 MED ORDER — FLUTICASONE PROPIONATE 50 MCG/ACT NA SUSP: 2.0000 | Freq: Every day | NASAL | 2 refills | Status: DC

## 2017-09-11 MED FILL — raNITIdine HCL 150 MG TABS: 150 | 30 days supply | Qty: 60 | Fill #0

## 2017-09-11 MED FILL — AMLODIPINE BESYLATE 10 MG T: 10 | 30 days supply | Qty: 30 | Fill #4

## 2017-09-11 MED FILL — HYDROCHLOROTHIAZIDE 12.5 MG: 12.5 | 30 days supply | Qty: 30 | Fill #1

## 2017-09-11 MED FILL — FLUTICASONE PROP 50 MCG SPR: 50 | 30 days supply | Qty: 16 | Fill #0

## 2017-10-04 MED FILL — PRAVASTATIN SODIUM 10 MG TA: 10 | 28 days supply | Qty: 12 | Fill #4

## 2017-10-08 MED FILL — HYDROCHLOROTHIAZIDE 12.5 MG: 12.5 | 30 days supply | Qty: 30 | Fill #2

## 2017-10-18 MED FILL — raNITIdine HCL 150 MG TABS: 150 | 30 days supply | Qty: 60 | Fill #1

## 2017-10-21 MED FILL — AMLODIPINE BESYLATE 10 MG T: 10 | 30 days supply | Qty: 30 | Fill #5

## 2017-11-06 MED FILL — HYDROCHLOROTHIAZIDE 12.5 MG: 12.5 | 30 days supply | Qty: 30 | Fill #3

## 2017-11-06 MED FILL — LOSARTAN POTASSIUM 100 MG T: 100 | 60 days supply | Qty: 60 | Fill #4

## 2017-11-06 MED FILL — PRAVASTATIN SODIUM 10 MG TA: 10 | 28 days supply | Qty: 12 | Fill #5

## 2017-11-06 MED FILL — FLUTICASONE PROP 50 MCG SPR: 50 | 30 days supply | Qty: 16 | Fill #1

## 2017-11-15 ENCOUNTER — Encounter: Payer: Self-pay | Admitting: Internal Medicine

## 2017-11-15 ENCOUNTER — Ambulatory Visit: Payer: Self-pay | Attending: Internal Medicine | Admitting: Internal Medicine

## 2017-11-15 VITALS — BP 130/66 | HR 84 | Temp 98.6°F | Resp 18 | Ht 63.0 in | Wt 207.0 lb

## 2017-11-15 DIAGNOSIS — Z6830 Body mass index (BMI) 30.0-30.9, adult: Secondary | ICD-10-CM | POA: Insufficient documentation

## 2017-11-15 DIAGNOSIS — I10 Essential (primary) hypertension: Secondary | ICD-10-CM | POA: Insufficient documentation

## 2017-11-15 DIAGNOSIS — F419 Anxiety disorder, unspecified: Secondary | ICD-10-CM | POA: Insufficient documentation

## 2017-11-15 DIAGNOSIS — Z7982 Long term (current) use of aspirin: Secondary | ICD-10-CM | POA: Insufficient documentation

## 2017-11-15 DIAGNOSIS — E669 Obesity, unspecified: Secondary | ICD-10-CM | POA: Insufficient documentation

## 2017-11-15 DIAGNOSIS — R252 Cramp and spasm: Secondary | ICD-10-CM | POA: Insufficient documentation

## 2017-11-15 DIAGNOSIS — Z79899 Other long term (current) drug therapy: Secondary | ICD-10-CM | POA: Insufficient documentation

## 2017-11-15 DIAGNOSIS — Z8249 Family history of ischemic heart disease and other diseases of the circulatory system: Secondary | ICD-10-CM | POA: Insufficient documentation

## 2017-11-15 DIAGNOSIS — Z888 Allergy status to other drugs, medicaments and biological substances status: Secondary | ICD-10-CM | POA: Insufficient documentation

## 2017-11-15 DIAGNOSIS — K219 Gastro-esophageal reflux disease without esophagitis: Secondary | ICD-10-CM | POA: Insufficient documentation

## 2017-11-15 DIAGNOSIS — E785 Hyperlipidemia, unspecified: Secondary | ICD-10-CM | POA: Insufficient documentation

## 2017-11-15 MED ORDER — CYCLOBENZAPRINE HCL 10 MG PO TABS: 10.0000 mg | ORAL_TABLET | Freq: Every day | ORAL | 1 refills | Status: DC

## 2017-11-15 MED FILL — CYCLOBENZAPRINE 10 MG TAB: 10 | 30 days supply | Qty: 30 | Fill #0

## 2017-11-15 NOTE — Patient Instructions (Signed)
Try to avoid baking sweet treats.  Try not to eat ice cream more than 1-2 times a week.

## 2017-11-15 NOTE — Progress Notes (Signed)
Patient ID: Kayla Lowe, female    DOB: 25-Mar-1956  MRN: [id number]  CC: Follow-up   Subjective: Kayla Lowe is a 62 y.o. female who presents for chronic ds management. Her concerns today include:  Pt with hx of HTN, HL, obesity, GERD and anxiety.  C/o intermittent cramps in inner thigh, more so LT.  Mainly at nights Takes Flexeril PRN.  Request RF  HTN: checks BP QOD with wrist device.  Range 120s/70s Limits salt in foods No CP/SOB, HA or LE edema  Obese: loss 4 lbs since last visit.  She cut back on drinking sweet tea; does a fruit smoothie every morning.  Weakness is sweet cakes and pies that she makes herself.  Loves ice cream Yoga class ended, "I have to find me another yoga class."  She has not used the gym in her building because too hot in there.  Ca+ elevated on last visit.  She stopped taking Vita min D when she received lab results Plan to check PTH, repeat Ca and Vit D level today Patient Active Problem List   Diagnosis Date Noted  . Seasonal allergic rhinitis due to pollen 08/12/2017  . Anxiety 11/30/2016  . 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 Other allergic rhinitis 11/20/2013  . Hyperlipidemia 09/10/2013  . Essential hypertension, benign 09/10/2013     Current Outpatient Medications on File Prior to Visit  Medication Sig Dispense Refill  . amLODipine (NORVASC) 10 MG tablet Take 1 tablet (10 mg total) by mouth every evening. 90 tablet 3  . aspirin EC 81 MG tablet Take 1 tablet (81 mg total) by mouth daily. 90 tablet 3  . cetirizine (ZYRTEC) 10 MG tablet Take 1 tablet (10 mg total) by mouth daily. 30 tablet 6  . cholecalciferol (VITAMIN D) 1000 UNITS tablet Take 1,000 Units by mouth every evening.    Marland Kitchen co-enzyme Q-10 50 MG capsule Take 100 mg by mouth daily after lunch.    . fluticasone (FLONASE) 50 MCG/ACT nasal spray Place 2 sprays into both nostrils daily. 16 g 2  . hydrochlorothiazide (MICROZIDE) 12.5 MG capsule Take 1  capsule (12.5 mg total) by mouth daily. 30 capsule 6  . losartan (COZAAR) 100 MG tablet Take 1 tablet (100 mg total) by mouth daily. 90 tablet 3  . Omega-3 Fatty Acids (FISH OIL) 1000 MG CAPS Take 1,000 mg by mouth every evening.    . pravastatin (PRAVACHOL) 10 MG tablet Take one tab Q Mon/Wed/Fri 30 tablet 6  . ranitidine (ZANTAC) 150 MG tablet Take 1 tablet (150 mg total) by mouth 2 (two) times daily. 60 tablet 2   No current facility-administered medications on file prior to visit.     Allergies  Allergen Reactions  . Statins Other (See Comments)    myalgias    Social History   Socioeconomic History  . Marital status: Legally Separated    Spouse name: Not on file  . Number of children: Not on file  . Years of education: Not on file  . Highest education level: Not on file  Occupational History  . Not on file  Social Needs  . Financial resource strain: Not on file  . Food insecurity:    Worry: Not on file    Inability: Not on file  . Transportation needs:    Medical: Not on file    Non-medical: Not on file  Tobacco Use  . Smoking status: Never Smoker  . Smokeless tobacco: Never Used  Substance and Sexual Activity  .  Alcohol use: No  . Drug use: No  . Sexual activity: Not Currently  Lifestyle  . Physical activity:    Days per week: Not on file    Minutes per session: Not on file  . Stress: Not on file  Relationships  . Social connections:    Talks on phone: Not on file    Gets together: Not on file    Attends religious service: Not on file    Active member of club or organization: Not on file    Attends meetings of clubs or organizations: Not on file    Relationship status: Not on file  . Intimate partner violence:    Fear of current or ex partner: Not on file    Emotionally abused: Not on file    Physically abused: Not on file    Forced sexual activity: Not on file  Other Topics Concern  . Not on file  Social History Narrative  . Not on file    Family  History  Problem Relation Age of Onset  . Cancer Father   . Hypertension Brother   . Diabetes Brother   . Diabetes Paternal Grandfather   . Diabetes Paternal Uncle   . Colon cancer Neg Hx     Past Surgical History:  Procedure Laterality Date  . ABDOMINAL HYSTERECTOMY  1987    ROS: Review of Systems Neg except as above PHYSICAL EXAM: BP 130/66 (BP Location: Left Arm, Patient Position: Sitting, Cuff Size: Normal)   Pulse 84   Temp 98.6 F (37 C) (Oral)   Resp 18   Ht 5\' 3"  (1.6 m)   Wt 207 lb (93.9 kg)   SpO2 98%   BMI 36.67 kg/m   Wt Readings from Last 3 Encounters:  11/15/17 207 lb (93.9 kg)  08/12/17 211 lb (95.7 kg)  04/30/17 210 lb 12.8 oz (95.6 kg)   Physical Exam  General appearance - alert, well appearing, and in no distress Mental status - normal mood, behavior, speech, dress, motor activity, and thought processes Neck - supple, no significant adenopathy Chest - clear to auscultation, no wheezes, rales or rhonchi, symmetric air entry Heart - normal rate, regular rhythm, normal S1, S2, no murmurs, rubs, clicks or gallops Extremities - peripheral pulses normal, no pedal edema, no clubbing or cyanosis  Results for orders placed or performed in visit on 09/03/17  Comprehensive metabolic panel  Result Value Ref Range   Glucose 87 65 - 99 mg/dL   BUN 11 8 - 27 mg/dL   Creatinine, Ser 2.13 (H) 0.57 - 1.00 mg/dL   GFR calc non Af Amer 58 (L) >59 mL/min/1.73   GFR calc Af Amer 67 >59 mL/min/1.73   BUN/Creatinine Ratio 11 (L) 12 - 28   Sodium 142 134 - 144 mmol/L   Potassium 4.3 3.5 - 5.2 mmol/L   Chloride 102 96 - 106 mmol/L   CO2 24 20 - 29 mmol/L   Calcium 10.5 (H) 8.7 - 10.3 mg/dL   Total Protein 7.6 6.0 - 8.5 g/dL   Albumin 4.1 3.6 - 4.8 g/dL   Globulin, Total 3.5 1.5 - 4.5 g/dL   Albumin/Globulin Ratio 1.2 1.2 - 2.2   Bilirubin Total 0.3 0.0 - 1.2 mg/dL   Alkaline Phosphatase 116 39 - 117 IU/L   AST 18 0 - 40 IU/L   ALT 15 0 - 32 IU/L  CBC  Result  Value Ref Range   WBC 6.6 3.4 - 10.8 x10E3/uL   RBC 4.28  3.77 - 5.28 x10E6/uL   Hemoglobin 13.2 11.1 - 15.9 g/dL   Hematocrit 19.140.8 47.834.0 - 46.6 %   MCV 95 79 - 97 fL   MCH 30.8 26.6 - 33.0 pg   MCHC 32.4 31.5 - 35.7 g/dL   RDW 29.514.7 62.112.3 - 30.815.4 %   Platelets 324 150 - 379 x10E3/uL    ASSESSMENT AND PLAN: 1. Essential hypertension At goal Cont HCTZ, Cozaar and Amlodipine  2. Nocturnal muscle cramps - cyclobenzaprine (FLEXERIL) 10 MG tablet; Take 1 tablet (10 mg total) by mouth at bedtime.  Dispense: 30 tablet; Refill: 1  3. Obesity (BMI 30-39.9) -Dietary counseling given.  Pt advise to stop baking sweet pies if she knows this is her weakness.   -continue to encourage her to get in some form of aerobic exercise several times a wk.  4. Hypercalcemia - PTH, Intact and Calcium - VITAMIN D 25 Hydroxy (Vit-D Deficiency, Fractures)  5. Hyperlipidemia, unspecified hyperlipidemia type Continue Pravachol - Lipid panel   Patient was given the opportunity to ask questions.  Patient verbalized understanding of the plan and was able to repeat key elements of the plan.   Orders Placed This Encounter  Procedures  . Lipid panel  . PTH, Intact and Calcium  . VITAMIN D 25 Hydroxy (Vit-D Deficiency, Fractures)     Requested Prescriptions   Signed Prescriptions Disp Refills  . cyclobenzaprine (FLEXERIL) 10 MG tablet 30 tablet 1    Sig: Take 1 tablet (10 mg total) by mouth at bedtime.    Return in about 4 months (around 03/18/2018).  Jonah Blueeborah Johnson, MD, FACP

## 2017-11-16 ENCOUNTER — Other Ambulatory Visit: Payer: Self-pay | Admitting: Internal Medicine

## 2017-11-16 LAB — LIPID PANEL
CHOL/HDL RATIO: 5.2 ratio — AB (ref 0.0–4.4)
Cholesterol, Total: 303 mg/dL — ABNORMAL HIGH (ref 100–199)
HDL: 58 mg/dL (ref >39)
LDL Calculated: 221 mg/dL — ABNORMAL HIGH (ref 0–99)
TRIGLYCERIDES: 120 mg/dL (ref 0–149)
VLDL Cholesterol Cal: 24 mg/dL (ref 5–40)

## 2017-11-16 LAB — PTH, INTACT AND CALCIUM
Calcium: 10.8 mg/dL — ABNORMAL HIGH (ref 8.7–10.3)
PTH: 36 pg/mL (ref 15–65)

## 2017-11-16 LAB — VITAMIN D 25 HYDROXY (VIT D DEFICIENCY, FRACTURES): Vit D, 25-Hydroxy: 22.8 ng/mL — ABNORMAL LOW (ref 30.0–100.0)

## 2017-11-16 MED ORDER — CARVEDILOL 3.125 MG PO TABS: 3.1250 mg | ORAL_TABLET | Freq: Two times a day (BID) | ORAL | 3 refills | Status: DC

## 2017-11-18 MED FILL — CARVEDILOL 3.125 MG TABLET: 3.125 | 30 days supply | Qty: 60 | Fill #0

## 2017-11-19 ENCOUNTER — Other Ambulatory Visit: Payer: Self-pay | Admitting: *Deleted

## 2017-11-19 MED ORDER — PRAVASTATIN SODIUM 20 MG PO TABS: 20.0000 mg | ORAL_TABLET | Freq: Every day | ORAL | 3 refills | Status: DC

## 2017-11-20 MED FILL — PRAVASTATIN SODIUM 20 MG TA: 20 | 28 days supply | Qty: 12 | Fill #0

## 2017-11-26 MED FILL — raNITIdine HCL 150 MG TABS: 150 | 30 days supply | Qty: 60 | Fill #2

## 2017-11-26 MED FILL — AMLODIPINE BESYLATE 10 MG T: 10 | 30 days supply | Qty: 30 | Fill #6

## 2017-12-23 ENCOUNTER — Other Ambulatory Visit (INDEPENDENT_AMBULATORY_CARE_PROVIDER_SITE_OTHER): Payer: Self-pay | Admitting: Internal Medicine

## 2017-12-23 DIAGNOSIS — K219 Gastro-esophageal reflux disease without esophagitis: Secondary | ICD-10-CM

## 2017-12-23 MED FILL — CARVEDILOL 3.125 MG TABLET: 3.125 | 30 days supply | Qty: 60 | Fill #1

## 2017-12-23 MED FILL — raNITIdine HCL 150 MG TABS: 150 | 30 days supply | Qty: 60 | Fill #0

## 2017-12-23 MED FILL — PRAVASTATIN SODIUM 20 MG TA: 20 | 28 days supply | Qty: 12 | Fill #1

## 2017-12-23 MED FILL — AMLODIPINE BESYLATE 10 MG T: 10 | 30 days supply | Qty: 30 | Fill #7

## 2018-01-06 ENCOUNTER — Other Ambulatory Visit: Payer: Self-pay

## 2018-01-09 ENCOUNTER — Ambulatory Visit: Payer: Self-pay | Attending: Family Medicine

## 2018-01-09 NOTE — Progress Notes (Signed)
Patient here for lab visit only 

## 2018-01-10 LAB — PARATHYROID HORMONE, INTACT (NO CA): PTH: 42 pg/mL (ref 15–65)

## 2018-01-10 LAB — CALCIUM: Calcium: 10.3 mg/dL (ref 8.7–10.3)

## 2018-01-16 ENCOUNTER — Other Ambulatory Visit: Payer: Self-pay

## 2018-01-16 DIAGNOSIS — I10 Essential (primary) hypertension: Secondary | ICD-10-CM

## 2018-01-16 MED ORDER — LOSARTAN POTASSIUM 100 MG PO TABS: 100.0000 mg | ORAL_TABLET | Freq: Every day | ORAL | 2 refills | Status: DC

## 2018-01-16 MED FILL — LOSARTAN POTASSIUM 100 MG T: 100 | 30 days supply | Qty: 30 | Fill #0

## 2018-01-24 ENCOUNTER — Other Ambulatory Visit: Payer: Self-pay | Admitting: Internal Medicine

## 2018-01-24 ENCOUNTER — Encounter: Payer: Self-pay | Admitting: Internal Medicine

## 2018-01-24 DIAGNOSIS — I10 Essential (primary) hypertension: Secondary | ICD-10-CM

## 2018-01-24 MED FILL — PRAVASTATIN SODIUM 20 MG TA: 20 | 28 days supply | Qty: 12 | Fill #2

## 2018-01-24 MED FILL — AMLODIPINE BESYLATE 10 MG T: 10 | 30 days supply | Qty: 30 | Fill #0

## 2018-01-24 MED FILL — CARVEDILOL 3.125 MG TABLET: 3.125 | 30 days supply | Qty: 60 | Fill #2

## 2018-01-24 MED FILL — raNITIdine HCL 150 MG TABS: 150 | 30 days supply | Qty: 60 | Fill #1

## 2018-02-17 MED FILL — LOSARTAN POTASSIUM 100 MG T: 100 | 30 days supply | Qty: 30 | Fill #1

## 2018-02-19 MED FILL — raNITIdine HCL 150 MG TABS: 150 | 30 days supply | Qty: 60 | Fill #2

## 2018-02-19 MED FILL — AMLODIPINE BESYLATE 10 MG T: 10 | 30 days supply | Qty: 30 | Fill #1

## 2018-02-19 MED FILL — CARVEDILOL 3.125 MG TABLET: 3.125 | 30 days supply | Qty: 60 | Fill #3

## 2018-02-19 MED FILL — PRAVASTATIN SODIUM 20 MG TA: 20 | 28 days supply | Qty: 12 | Fill #3

## 2018-03-17 MED FILL — LOSARTAN POTASSIUM 100 MG T: 100 | 30 days supply | Qty: 30 | Fill #2

## 2018-03-24 ENCOUNTER — Other Ambulatory Visit: Payer: Self-pay | Admitting: Internal Medicine

## 2018-03-24 DIAGNOSIS — K219 Gastro-esophageal reflux disease without esophagitis: Secondary | ICD-10-CM

## 2018-03-24 MED FILL — PRAVASTATIN SODIUM 20 MG TA: 20 | 28 days supply | Qty: 12 | Fill #4

## 2018-03-24 MED FILL — AMLODIPINE BESYLATE 10 MG T: 10 | 30 days supply | Qty: 30 | Fill #2

## 2018-03-25 MED FILL — CARVEDILOL 3.125 MG TABLET: 3.125 | 30 days supply | Qty: 60 | Fill #0

## 2018-03-25 MED FILL — raNITIdine HCL 150 MG TABS: 150 | 30 days supply | Qty: 60 | Fill #0

## 2018-03-27 ENCOUNTER — Encounter: Payer: Self-pay | Admitting: Internal Medicine

## 2018-03-27 ENCOUNTER — Ambulatory Visit: Payer: Self-pay | Attending: Internal Medicine | Admitting: Internal Medicine

## 2018-03-27 VITALS — BP 113/73 | HR 71 | Temp 98.5°F | Resp 16 | Wt 209.2 lb

## 2018-03-27 DIAGNOSIS — Z6830 Body mass index (BMI) 30.0-30.9, adult: Secondary | ICD-10-CM

## 2018-03-27 DIAGNOSIS — E785 Hyperlipidemia, unspecified: Secondary | ICD-10-CM | POA: Insufficient documentation

## 2018-03-27 DIAGNOSIS — Z6837 Body mass index (BMI) 37.0-37.9, adult: Secondary | ICD-10-CM | POA: Insufficient documentation

## 2018-03-27 DIAGNOSIS — K219 Gastro-esophageal reflux disease without esophagitis: Secondary | ICD-10-CM | POA: Insufficient documentation

## 2018-03-27 DIAGNOSIS — Z23 Encounter for immunization: Secondary | ICD-10-CM | POA: Insufficient documentation

## 2018-03-27 DIAGNOSIS — Z79899 Other long term (current) drug therapy: Secondary | ICD-10-CM | POA: Insufficient documentation

## 2018-03-27 DIAGNOSIS — E669 Obesity, unspecified: Secondary | ICD-10-CM | POA: Insufficient documentation

## 2018-03-27 DIAGNOSIS — I10 Essential (primary) hypertension: Secondary | ICD-10-CM | POA: Insufficient documentation

## 2018-03-27 DIAGNOSIS — Z7982 Long term (current) use of aspirin: Secondary | ICD-10-CM | POA: Insufficient documentation

## 2018-03-27 DIAGNOSIS — Z888 Allergy status to other drugs, medicaments and biological substances status: Secondary | ICD-10-CM | POA: Insufficient documentation

## 2018-03-27 DIAGNOSIS — F419 Anxiety disorder, unspecified: Secondary | ICD-10-CM | POA: Insufficient documentation

## 2018-03-27 MED ORDER — PRAVASTATIN SODIUM 20 MG PO TABS: 20.0000 mg | ORAL_TABLET | Freq: Every day | ORAL | 12 refills | Status: DC

## 2018-03-27 MED ORDER — AMLODIPINE BESYLATE 10 MG PO TABS: 10.0000 mg | ORAL_TABLET | Freq: Every evening | ORAL | 11 refills | Status: DC

## 2018-03-27 NOTE — Progress Notes (Signed)
Patient ID: Kayla Lowe, female    DOB: October 21, 1955  MRN: [id number]  CC: Hypertension   Subjective: Kayla Lowe is a 62 y.o. female who presents for chronic ds management. Her concerns today include:  Pt with hx of HTN, HL, obesity, GERD and anxiety.  HTN: compliant with amlodipine, Cozaar and carvedilol. Checks blood pressure several times a week.  Last evening reading was 111/71 Walking 3 x a wk for 1.5 miles.  Plans to walk in the house now that it is cold outside.  She does have a gym in her apartment complex.[place].   HL:  Tolerating Pravastatin okay.    Small growth on the corner of the right eye has decreased in size over the past several months Patient Active Problem List   Diagnosis Date Noted  . Seasonal allergic rhinitis due to pollen 08/12/2017  . Anxiety 11/30/2016  . 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 Other allergic rhinitis 11/20/2013  . Hyperlipidemia 09/10/2013  . Essential hypertension, benign 09/10/2013     Current Outpatient Medications on File Prior to Visit  Medication Sig Dispense Refill  . aspirin EC 81 MG tablet Take 1 tablet (81 mg total) by mouth daily. 90 tablet 3  . carvedilol (COREG) 3.125 MG tablet TAKE 1 TABLET (3.125 MG TOTAL) BY MOUTH 2 (TWO) TIMES DAILY WITH A MEAL. 60 tablet 2  . cetirizine (ZYRTEC) 10 MG tablet Take 1 tablet (10 mg total) by mouth daily. 30 tablet 6  . co-enzyme Q-10 50 MG capsule Take 100 mg by mouth daily after lunch.    . cyclobenzaprine (FLEXERIL) 10 MG tablet Take 1 tablet (10 mg total) by mouth at bedtime. 30 tablet 1  . fluticasone (FLONASE) 50 MCG/ACT nasal spray Place 2 sprays into both nostrils daily. 16 g 2  . losartan (COZAAR) 100 MG tablet Take 1 tablet (100 mg total) by mouth daily. 30 tablet 2  . Omega-3 Fatty Acids (FISH OIL) 1000 MG CAPS Take 1,000 mg by mouth every evening.    . ranitidine (ZANTAC) 150 MG tablet TAKE 1 TABLET BY MOUTH 2 TIMES DAILY. 60 tablet 2   No  current facility-administered medications on file prior to visit.     Allergies  Allergen Reactions  . Statins Other (See Comments)    myalgias    Social History   Socioeconomic History  . Marital status: Legally Separated    Spouse name: Not on file  . Number of children: Not on file  . Years of education: Not on file  . Highest education level: Not on file  Occupational History  . Not on file  Social Needs  . Financial resource strain: Not on file  . Food insecurity:    Worry: Not on file    Inability: Not on file  . Transportation needs:    Medical: Not on file    Non-medical: Not on file  Tobacco Use  . Smoking status: Never Smoker  . Smokeless tobacco: Never Used  Substance and Sexual Activity  . Alcohol use: No  . Drug use: No  . Sexual activity: Not Currently  Lifestyle  . Physical activity:    Days per week: Not on file    Minutes per session: Not on file  . Stress: Not on file  Relationships  . Social connections:    Talks on phone: Not on file    Gets together: Not on file    Attends religious service: Not on file    Active member of  club or organization: Not on file    Attends meetings of clubs or organizations: Not on file    Relationship status: Not on file  . Intimate partner violence:    Fear of current or ex partner: Not on file    Emotionally abused: Not on file    Physically abused: Not on file    Forced sexual activity: Not on file  Other Topics Concern  . Not on file  Social History Narrative  . Not on file    Family History  Problem Relation Age of Onset  . Cancer Father   . Hypertension Brother   . Diabetes Brother   . Diabetes Paternal Grandfather   . Diabetes Paternal Uncle   . Colon cancer Neg Hx     Past Surgical History:  Procedure Laterality Date  . ABDOMINAL HYSTERECTOMY  1987    ROS: Review of Systems Negative except as above  PHYSICAL EXAM: BP 113/73   Pulse 71   Temp 98.5 F (36.9 C) (Oral)   Resp 16    Wt 209 lb 3.2 oz (94.9 kg)   SpO2 100%   BMI 37.06 kg/m   Wt Readings from Last 3 Encounters:  03/27/18 209 lb 3.2 oz (94.9 kg)  11/15/17 207 lb (93.9 kg)  08/12/17 211 lb (95.7 kg)    Physical Exam  General appearance - alert, well appearing, and in no distress Mental status - normal mood, behavior, speech, dress, motor activity, and thought processes Neck - supple, no significant adenopathy Chest - clear to auscultation, no wheezes, rales or rhonchi, symmetric air entry Heart - normal rate, regular rhythm, normal S1, S2, no murmurs, rubs, clicks or gallops Extremities -no lower extremity edema Eye: Small cysts in the lateral aspect of the right eye has decreased in size  ASSESSMENT AND PLAN:  1. Essential hypertension At goal.  Continue current medications and low-salt diet. - amLODipine (NORVASC) 10 MG tablet; Take 1 tablet (10 mg total) by mouth every evening.  Dispense: 30 tablet; Refill: 11  2. Hyperlipidemia, unspecified hyperlipidemia type - pravastatin (PRAVACHOL) 20 MG tablet; Take 1 tablet (20 mg total) by mouth daily. Take Pravastatin 20 mg every M/W/F as directed by Dr. Laural Benes.  Dispense: 30 tablet; Refill: 12  3. Obesity (BMI 30-39.9) Encouraged her to continue healthy eating habits and regular exercise.  4. Need for immunization against influenza - Flu Vaccine QUAD 36+ mos IM  Patient was given the opportunity to ask questions.  Patient verbalized understanding of the plan and was able to repeat key elements of the plan.   Orders Placed This Encounter  Procedures  . Flu Vaccine QUAD 36+ mos IM     Requested Prescriptions   Signed Prescriptions Disp Refills  . amLODipine (NORVASC) 10 MG tablet 30 tablet 11    Sig: Take 1 tablet (10 mg total) by mouth every evening.  . pravastatin (PRAVACHOL) 20 MG tablet 30 tablet 12    Sig: Take 1 tablet (20 mg total) by mouth daily. Take Pravastatin 20 mg every M/W/F as directed by Dr. Laural Benes.    Return in about 4  months (around 07/26/2018).  Jonah Blue, MD, FACP

## 2018-03-27 NOTE — Patient Instructions (Addendum)
Continue to limit salt in the foods.  Continue regular exercise.  Consider using the gym that is in your building.   Influenza Virus Vaccine injection (Fluarix) What is this medicine? INFLUENZA VIRUS VACCINE (in floo EN zuh VAHY ruhs vak SEEN) helps to reduce the risk of getting influenza also known as the flu. This medicine may be used for other purposes; ask your health care provider or pharmacist if you have questions. COMMON BRAND NAME(S): Fluarix, Fluzone What should I tell my health care provider before I take this medicine? They need to know if you have any of these conditions: -bleeding disorder like hemophilia -fever or infection -Guillain-Barre syndrome or other neurological problems -immune system problems -infection with the human immunodeficiency virus (HIV) or AIDS -low blood platelet counts -multiple sclerosis -an unusual or allergic reaction to influenza virus vaccine, eggs, chicken proteins, latex, gentamicin, other medicines, foods, dyes or preservatives -pregnant or trying to get pregnant -breast-feeding How should I use this medicine? This vaccine is for injection into a muscle. It is given by a health care professional. A copy of Vaccine Information Statements will be given before each vaccination. Read this sheet carefully each time. The sheet may change frequently. Talk to your pediatrician regarding the use of this medicine in children. Special care may be needed. Overdosage: If you think you have taken too much of this medicine contact a poison control center or emergency room at once. NOTE: This medicine is only for you. Do not share this medicine with others. What if I miss a dose? This does not apply. What may interact with this medicine? -chemotherapy or radiation therapy -medicines that lower your immune system like etanercept, anakinra, infliximab, and adalimumab -medicines that treat or prevent blood clots like warfarin -phenytoin -steroid medicines  like prednisone or cortisone -theophylline -vaccines This list may not describe all possible interactions. Give your health care provider a list of all the medicines, herbs, non-prescription drugs, or dietary supplements you use. Also tell them if you smoke, drink alcohol, or use illegal drugs. Some items may interact with your medicine. What should I watch for while using this medicine? Report any side effects that do not go away within 3 days to your doctor or health care professional. Call your health care provider if any unusual symptoms occur within 6 weeks of receiving this vaccine. You may still catch the flu, but the illness is not usually as bad. You cannot get the flu from the vaccine. The vaccine will not protect against colds or other illnesses that may cause fever. The vaccine is needed every year. What side effects may I notice from receiving this medicine? Side effects that you should report to your doctor or health care professional as soon as possible: -allergic reactions like skin rash, itching or hives, swelling of the face, lips, or tongue Side effects that usually do not require medical attention (report to your doctor or health care professional if they continue or are bothersome): -fever -headache -muscle aches and pains -pain, tenderness, redness, or swelling at site where injected -weak or tired This list may not describe all possible side effects. Call your doctor for medical advice about side effects. You may report side effects to FDA at 1-800-FDA-1088. Where should I keep my medicine? This vaccine is only given in a clinic, pharmacy, doctor's office, or other health care setting and will not be stored at home. NOTE: This sheet is a summary. It may not cover all possible information. If you have questions  about this medicine, talk to your doctor, pharmacist, or health care provider.  2018 Elsevier/Gold Standard (2007-11-26 09:30:40)

## 2018-04-16 ENCOUNTER — Other Ambulatory Visit: Payer: Self-pay | Admitting: Internal Medicine

## 2018-04-16 DIAGNOSIS — I10 Essential (primary) hypertension: Secondary | ICD-10-CM

## 2018-04-16 MED FILL — PRAVASTATIN SODIUM 20 MG TA: 20 | 28 days supply | Qty: 12 | Fill #5

## 2018-04-17 MED FILL — LOSARTAN POTASSIUM 100 MG T: 100 | 30 days supply | Qty: 30 | Fill #0

## 2018-04-22 ENCOUNTER — Encounter: Payer: Self-pay | Admitting: Internal Medicine

## 2018-04-22 MED FILL — CARVEDILOL 3.125 MG TABLET: 3.125 | 30 days supply | Qty: 60 | Fill #1

## 2018-04-22 MED FILL — AMLODIPINE BESYLATE 10 MG T: 10 | 30 days supply | Qty: 30 | Fill #0

## 2018-04-23 ENCOUNTER — Other Ambulatory Visit: Payer: Self-pay | Admitting: Internal Medicine

## 2018-04-23 MED ORDER — FAMOTIDINE 20 MG PO TABS: 20.0000 mg | ORAL_TABLET | Freq: Two times a day (BID) | ORAL | 4 refills | Status: DC

## 2018-04-23 MED FILL — FAMOTIDINE 20 MG TABLET: 20 | 30 days supply | Qty: 60 | Fill #0

## 2018-05-23 MED FILL — LOSARTAN POTASSIUM 100 MG T: 100 | 30 days supply | Qty: 30 | Fill #1

## 2018-05-23 MED FILL — PRAVASTATIN SODIUM 20 MG TA: 20 | 28 days supply | Qty: 12 | Fill #6

## 2018-05-23 MED FILL — AMLODIPINE BESYLATE 10 MG T: 10 | 30 days supply | Qty: 30 | Fill #1

## 2018-05-23 MED FILL — CARVEDILOL 3.125 MG TABLET: 3.125 | 30 days supply | Qty: 60 | Fill #2

## 2018-06-23 ENCOUNTER — Other Ambulatory Visit: Payer: Self-pay | Admitting: Internal Medicine

## 2018-06-23 MED FILL — CARVEDILOL 3.125 MG TABLET: 3.125 | 30 days supply | Qty: 60 | Fill #0

## 2018-06-23 MED FILL — PRAVASTATIN SODIUM 20 MG TA: 20 | 28 days supply | Qty: 12 | Fill #7

## 2018-06-23 MED FILL — LOSARTAN POTASSIUM 100 MG T: 100 | 30 days supply | Qty: 30 | Fill #2

## 2018-06-23 MED FILL — AMLODIPINE BESYLATE 10 MG T: 10 | 30 days supply | Qty: 30 | Fill #2

## 2018-07-23 ENCOUNTER — Other Ambulatory Visit: Payer: Self-pay | Admitting: Internal Medicine

## 2018-07-23 DIAGNOSIS — I10 Essential (primary) hypertension: Secondary | ICD-10-CM

## 2018-07-23 MED FILL — AMLODIPINE BESYLATE 10 MG T: 10 | 30 days supply | Qty: 30 | Fill #3

## 2018-07-23 MED FILL — CARVEDILOL 3.125 MG TABLET: 3.125 | 30 days supply | Qty: 60 | Fill #1

## 2018-07-23 MED FILL — PRAVASTATIN SODIUM 20 MG TA: 20 | 28 days supply | Qty: 12 | Fill #8

## 2018-07-28 MED FILL — LOSARTAN POTASSIUM 100 MG T: 100 | 30 days supply | Qty: 30 | Fill #0

## 2018-07-29 ENCOUNTER — Ambulatory Visit: Payer: Self-pay | Attending: Internal Medicine | Admitting: Internal Medicine

## 2018-07-29 ENCOUNTER — Other Ambulatory Visit: Payer: Self-pay

## 2018-07-29 ENCOUNTER — Encounter: Payer: Self-pay | Admitting: Internal Medicine

## 2018-07-29 VITALS — BP 142/82 | HR 70 | Temp 98.3°F | Resp 16 | Ht 63.5 in | Wt 213.0 lb

## 2018-07-29 DIAGNOSIS — E785 Hyperlipidemia, unspecified: Secondary | ICD-10-CM

## 2018-07-29 DIAGNOSIS — Z23 Encounter for immunization: Secondary | ICD-10-CM

## 2018-07-29 DIAGNOSIS — E669 Obesity, unspecified: Secondary | ICD-10-CM

## 2018-07-29 DIAGNOSIS — I10 Essential (primary) hypertension: Secondary | ICD-10-CM

## 2018-07-29 NOTE — Patient Instructions (Addendum)
Please give patient an appointment with the clinical pharmacist in 1 month for repeat blood pressure check.  Continue to monitor your blood pressure at home and bring in readings in 1 month when you come to see the clinical pharmacist for repeat blood pressure check.  Pneumococcal Conjugate Vaccine (PCV13): What You Need to Know 1. Why get vaccinated? Vaccination can protect both children and adults from pneumococcal disease. Pneumococcal disease is caused by bacteria that can spread from person to person through close contact. It can cause ear infections, and it can also lead to more serious infections of the:  Lungs (pneumonia),  Blood (bacteremia), and  Covering of the brain and spinal cord (meningitis). Pneumococcal pneumonia is most common among adults. Pneumococcal meningitis can cause deafness and brain damage, and it kills about 1 child in 10 who get it. Anyone can get pneumococcal disease, but children under 14 years of age and adults 82 years and older, people with certain medical conditions, and cigarette smokers are at the highest risk. Before there was a vaccine, the [place] saw:  more than 700 cases of meningitis,  about 13,000 blood infections,  about 5 million ear infections, and  about 200 deaths in children under 5 each year from pneumococcal disease. Since vaccine became available, severe pneumococcal disease in these children has fallen by 88%. About 18,000 older adults die of pneumococcal disease each year in the [place]. Treatment of pneumococcal infections with penicillin and other drugs is not as effective as it used to be, because some strains of the disease have become resistant to these drugs. This makes prevention of the disease, through vaccination, even more important. 2. PCV13 vaccine Pneumococcal conjugate vaccine (called PCV13) protects against 13 types of pneumococcal bacteria. PCV13 is routinely given to children at 2, 4, 6, and 12-15 months  of age. It is also recommended for children and adults 10 to 105 years of age with certain health conditions, and for all adults 64 years of age and older. Your doctor can give you details. 3. Some people should not get this vaccine Anyone who has ever had a life-threatening allergic reaction to a dose of this vaccine, to an earlier pneumococcal vaccine called PCV7, or to any vaccine containing diphtheria toxoid (for example, DTaP), should not get PCV13. Anyone with a severe allergy to any component of PCV13 should not get the vaccine. Tell your doctor if the person being vaccinated has any severe allergies. If the person scheduled for vaccination is not feeling well, your healthcare provider might decide to reschedule the shot on another day. 4. Risks of a vaccine reaction With any medicine, including vaccines, there is a chance of reactions. These are usually mild and go away on their own, but serious reactions are also possible. Problems reported following PCV13 varied by age and dose in the series. The most common problems reported among children were:  About half became drowsy after the shot, had a temporary loss of appetite, or had redness or tenderness where the shot was given.  About 1 out of 3 had swelling where the shot was given.  About 1 out of 3 had a mild fever, and about 1 in 20 had a fever over 102.14F.  Up to about 8 out of 10 became fussy or irritable. Adults have reported pain, redness, and swelling where the shot was given; also mild fever, fatigue, headache, chills, or muscle pain. Young children who get PCV13 along with inactivated flu vaccine at the same time  may be at increased risk for seizures caused by fever. Ask your doctor for more information. Problems that could happen after any vaccine:  People sometimes faint after a medical procedure, including vaccination. Sitting or lying down for about 15 minutes can help prevent fainting, and injuries caused by a fall. Tell  your doctor if you feel dizzy, or have vision changes or ringing in the ears.  Some older children and adults get severe pain in the shoulder and have difficulty moving the arm where a shot was given. This happens very rarely.  Any medication can cause a severe allergic reaction. Such reactions from a vaccine are very rare, estimated at about 1 in a million doses, and would happen within a few minutes to a few hours after the vaccination. As with any medicine, there is a very small chance of a vaccine causing a serious injury or death. The safety of vaccines is always being monitored. For more information, visit: http://floyd.org/ 5. What if there is a serious reaction? What should I look for?  Look for anything that concerns you, such as signs of a severe allergic reaction, very high fever, or unusual behavior. Signs of a severe allergic reaction can include hives, swelling of the face and throat, difficulty breathing, a fast heartbeat, dizziness, and weakness-usually within a few minutes to a few hours after the vaccination. What should I do?  If you think it is a severe allergic reaction or other emergency that can't wait, call 9-1-1 or get the person to the nearest hospital. Otherwise, call your doctor. Reactions should be reported to the Vaccine Adverse Event Reporting System (VAERS). Your doctor should file this report, or you can do it yourself through the VAERS web site at www.vaers.LAgents.no, or by calling 1-(571)322-3873. VAERS does not give medical advice. 6. The National Vaccine Injury Compensation Program The [employer] Vaccine Injury Compensation Program (VICP) is a federal program that was created to compensate people who may have been injured by certain vaccines. Persons who believe they may have been injured by a vaccine can learn about the program and about filing a claim by calling 1-854-640-6015 or visiting the VICP website at SpiritualWord.at. There is a  time limit to file a claim for compensation. 7. How can I learn more?  Ask your healthcare provider. He or she can give you the vaccine package insert or suggest other sources of information.  Call your local or state health department.  Contact the Centers for Disease Control and Prevention (CDC): ? Call 339-315-2224 (1-800-CDC-INFO) or ? Visit CDC's website at PicCapture.uy Vaccine Information Statement PCV13 Vaccine (03/18/2014) This information is not intended to replace advice given to you by your health care provider. Make sure you discuss any questions you have with your health care provider. Document Released: 02/25/2006 Document Revised: 12/10/2017 Document Reviewed: 12/10/2017 Elsevier Interactive Patient Education  2019 [employer].

## 2018-07-29 NOTE — Progress Notes (Signed)
Patient ID: Kayla Lowe, female    DOB: 21-Dec-1955  MRN: [id number]  CC: Hypertension   Subjective: Kayla Lowe is a 63 y.o. female who presents for chronic disease management. Her concerns today include:  Pt with hx of HTN, HL, obesity, GERD and anxiety.  HYPERTENSION Currently taking: see medication list Med Adherence: [x]  Yes    []  No Medication side effects: []  Yes    [x]  No Adherence with salt restriction: [x]  Yes    []  No Home Monitoring?: [x]  Yes    []  No Monitoring Frequency: 4 x a wk Home BP results range: 106/67.  Highest SBP was 113.  Forgot to bring log. SOB? []  Yes    [x]  No Chest Pain?: []  Yes    [x]  No Leg swelling?: []  Yes    [x]  No Headaches?: []  Yes    [x]  No Dizziness? []  Yes    [x]  No Comments:   HL: compliant with Pravachol.  Occasional cramps for which she drinks tonic water.   Obesity:  "I was lazy this winter and have been eating a lot of sweets."  Just starting walking again.  Doing intermittent fasting with her church until Easter.  Has cut back on eating sweets.  Drinking mainly water.    Just turned 63 and has applied for medicare.   Patient Active Problem List   Diagnosis Date Noted   Seasonal allergic rhinitis due to pollen 08/12/2017   Anxiety 11/30/2016   Elevated rheumatoid factor 02/06/2016   Other allergic rhinitis 11/20/2013   Hyperlipidemia 09/10/2013   Essential hypertension, benign 09/10/2013     Current Outpatient Medications on File Prior to Visit  Medication Sig Dispense Refill   amLODipine (NORVASC) 10 MG tablet Take 1 tablet (10 mg total) by mouth every evening. 30 tablet 11   aspirin EC 81 MG tablet Take 1 tablet (81 mg total) by mouth daily. 90 tablet 3   carvedilol (COREG) 3.125 MG tablet TAKE 1 TABLET (3.125 MG TOTAL) BY MOUTH 2 (TWO) TIMES DAILY WITH A MEAL. 60 tablet 2   cetirizine (ZYRTEC) 10 MG tablet Take 1 tablet (10 mg total) by mouth daily. 30 tablet 6   co-enzyme  Q-10 50 MG capsule Take 100 mg by mouth daily after lunch.     cyclobenzaprine (FLEXERIL) 10 MG tablet Take 1 tablet (10 mg total) by mouth at bedtime. 30 tablet 1   famotidine (PEPCID) 20 MG tablet Take 1 tablet (20 mg total) by mouth 2 (two) times daily. 60 tablet 4   fluticasone (FLONASE) 50 MCG/ACT nasal spray Place 2 sprays into both nostrils daily. 16 g 2   losartan (COZAAR) 100 MG tablet TAKE 1 TABLET (100 MG TOTAL) BY MOUTH DAILY. 30 tablet 2   Omega-3 Fatty Acids (FISH OIL) 1000 MG CAPS Take 1,000 mg by mouth every evening.     pravastatin (PRAVACHOL) 20 MG tablet Take 1 tablet (20 mg total) by mouth daily. Take Pravastatin 20 mg every M/W/F as directed by Dr. Laural Benes. 30 tablet 12   No current facility-administered medications on file prior to visit.     Allergies  Allergen Reactions   Statins Other (See Comments)    myalgias    Social History   Socioeconomic History   Marital status: Legally Separated    Spouse name: Not on file   Number of children: Not on file   Years of education: Not on file   Highest education level: Not on file  Occupational History  Not on file  Social Needs   Financial resource strain: Not on file   Food insecurity:    Worry: Not on file    Inability: Not on file   Transportation needs:    Medical: Not on file    Non-medical: Not on file  Tobacco Use   Smoking status: Never Smoker   Smokeless tobacco: Never Used  Substance and Sexual Activity   Alcohol use: No   Drug use: No   Sexual activity: Not Currently  Lifestyle   Physical activity:    Days per week: Not on file    Minutes per session: Not on file   Stress: Not on file  Relationships   Social connections:    Talks on phone: Not on file    Gets together: Not on file    Attends religious service: Not on file    Active member of club or organization: Not on file    Attends meetings of clubs or organizations: Not on file    Relationship status: Not on  file   Intimate partner violence:    Fear of current or ex partner: Not on file    Emotionally abused: Not on file    Physically abused: Not on file    Forced sexual activity: Not on file  Other Topics Concern   Not on file  Social History Narrative   Not on file    Family History  Problem Relation Age of Onset   Cancer Father    Hypertension Brother    Diabetes Brother    Diabetes Paternal Grandfather    Diabetes Paternal Uncle    Colon cancer Neg Hx     Past Surgical History:  Procedure Laterality Date   ABDOMINAL HYSTERECTOMY  1987    ROS: Review of Systems Negative except as stated above  PHYSICAL EXAM: BP (!) 142/82    Pulse 70    Temp 98.3 F (36.8 C) (Oral)    Resp 16    Ht 5' 3.5" (1.613 m)    Wt 213 lb (96.6 kg)    SpO2 98%    BMI 37.14 kg/m   Wt Readings from Last 3 Encounters:  07/29/18 213 lb (96.6 kg)  03/27/18 209 lb 3.2 oz (94.9 kg)  11/15/17 207 lb (93.9 kg)    Physical Exam  General appearance - alert, well appearing, and in no distress Mental status - normal mood, behavior, speech, dress, motor activity, and thought processes Neck - supple, no significant adenopathy Chest - clear to auscultation, no wheezes, rales or rhonchi, symmetric air entry Heart - normal rate, regular rhythm, normal S1, S2, no murmurs, rubs, clicks or gallops Extremities - peripheral pulses normal, no pedal edema, no clubbing or cyanosis   CMP Latest Ref Rng & Units 01/09/2018 11/15/2017 09/03/2017  Glucose 65 - 99 mg/dL - - 87  BUN 8 - 27 mg/dL - - 11  Creatinine 2.59 - 1.00 mg/dL - - 5.63(O)  Sodium 756 - 144 mmol/L - - 142  Potassium 3.5 - 5.2 mmol/L - - 4.3  Chloride 96 - 106 mmol/L - - 102  CO2 20 - 29 mmol/L - - 24  Calcium 8.7 - 10.3 mg/dL 43.3 10.8(H) 10.5(H)  Total Protein 6.0 - 8.5 g/dL - - 7.6  Total Bilirubin 0.0 - 1.2 mg/dL - - 0.3  Alkaline Phos 39 - 117 IU/L - - 116  AST 0 - 40 IU/L - - 18  ALT 0 - 32 IU/L - - 15  Lipid Panel       Component Value Date/Time   CHOL 303 (H) 11/15/2017 0911   TRIG 120 11/15/2017 0911   HDL 58 11/15/2017 0911   CHOLHDL 5.2 (H) 11/15/2017 0911   CHOLHDL 4.4 08/03/2015 0942   VLDL 21 08/03/2015 0942   LDLCALC 221 (H) 11/15/2017 0911   LDLDIRECT 159 (H) 09/05/2016 0838    CBC    Component Value Date/Time   WBC 6.6 09/03/2017 0831   WBC 6.1 07/17/2013 1740   RBC 4.28 09/03/2017 0831   RBC 4.50 07/17/2013 1740   HGB 13.2 09/03/2017 0831   HCT 40.8 09/03/2017 0831   PLT 324 09/03/2017 0831   MCV 95 09/03/2017 0831   MCH 30.8 09/03/2017 0831   MCH 30.4 07/17/2013 1740   MCHC 32.4 09/03/2017 0831   MCHC 33.9 07/17/2013 1740   RDW 14.7 09/03/2017 0831   LYMPHSABS 1.8 07/10/2013 0855   MONOABS 0.3 07/10/2013 0855   EOSABS 0.1 07/10/2013 0855   BASOSABS 0.0 07/10/2013 0855    ASSESSMENT AND PLAN: 1. Essential hypertension Not at goal but reported home blood pressure readings have been good.  She will continue current medications and home monitoring. See clinical pharmacist in 1 month for repeat blood pressure check. - CBC - Comprehensive metabolic panel  2. Hyperlipidemia, unspecified hyperlipidemia type Continue Pravachol.  3. Obesity (BMI 30-39.9) Discussed the importance of changes in eating habits to achieve weight loss.  Encouraged her to be more active.  4. Need for vaccination against Streptococcus pneumoniae using pneumococcal conjugate vaccine 13 Given.  HM: At age for DEXA scan.  We will plan to do once she has her Medicare card.  Patient was given the opportunity to ask questions.  Patient verbalized understanding of the plan and was able to repeat key elements of the plan.   Orders Placed This Encounter  Procedures   CBC   Comprehensive metabolic panel     Requested Prescriptions    No prescriptions requested or ordered in this encounter    Return in about 3 months (around 10/29/2018).  Jonah Blue, MD, FACP

## 2018-07-30 LAB — COMPREHENSIVE METABOLIC PANEL
ALT: 12 IU/L (ref 0–32)
AST: 14 IU/L (ref 0–40)
Albumin/Globulin Ratio: 1.5 (ref 1.2–2.2)
Albumin: 4.2 g/dL (ref 3.8–4.8)
Alkaline Phosphatase: 116 IU/L (ref 39–117)
BUN/Creatinine Ratio: 9 — ABNORMAL LOW (ref 12–28)
BUN: 8 mg/dL (ref 8–27)
Bilirubin Total: 0.5 mg/dL (ref 0.0–1.2)
CO2: 23 mmol/L (ref 20–29)
Calcium: 10 mg/dL (ref 8.7–10.3)
Chloride: 106 mmol/L (ref 96–106)
Creatinine, Ser: 0.93 mg/dL (ref 0.57–1.00)
GFR calc Af Amer: 75 mL/min/{1.73_m2} (ref >59)
GFR calc non Af Amer: 65 mL/min/{1.73_m2} (ref >59)
Globulin, Total: 2.8 g/dL (ref 1.5–4.5)
Glucose: 91 mg/dL (ref 65–99)
POTASSIUM: 4.4 mmol/L (ref 3.5–5.2)
Sodium: 145 mmol/L — ABNORMAL HIGH (ref 134–144)
Total Protein: 7 g/dL (ref 6.0–8.5)

## 2018-07-30 LAB — CBC
Hematocrit: 39.6 % (ref 34.0–46.6)
Hemoglobin: 13.2 g/dL (ref 11.1–15.9)
MCH: 30.5 pg (ref 26.6–33.0)
MCHC: 33.3 g/dL (ref 31.5–35.7)
MCV: 92 fL (ref 79–97)
PLATELETS: 189 10*3/uL (ref 150–450)
RBC: 4.33 x10E6/uL (ref 3.77–5.28)
RDW: 13.1 % (ref 11.7–15.4)
WBC: 5.5 10*3/uL (ref 3.4–10.8)

## 2018-08-21 MED FILL — PRAVASTATIN SODIUM 20 MG TA: 20 | 28 days supply | Qty: 12 | Fill #9

## 2018-08-23 MED FILL — FLUTICASONE PROP 50 MCG SPR: 50 | 30 days supply | Qty: 16 | Fill #2

## 2018-08-23 MED FILL — CARVEDILOL 3.125 MG TABLET: 3.125 | 30 days supply | Qty: 60 | Fill #2

## 2018-08-23 MED FILL — AMLODIPINE BESYLATE 10 MG T: 10 | 30 days supply | Qty: 30 | Fill #4

## 2018-08-23 MED FILL — LOSARTAN POTASSIUM 100 MG T: 100 | 30 days supply | Qty: 30 | Fill #1

## 2018-08-29 ENCOUNTER — Other Ambulatory Visit: Payer: Self-pay

## 2018-08-29 ENCOUNTER — Ambulatory Visit: Payer: Medicare Other | Attending: Family Medicine | Admitting: Pharmacist

## 2018-08-29 ENCOUNTER — Encounter: Payer: Self-pay | Admitting: Pharmacist

## 2018-08-29 VITALS — BP 112/73 | HR 62

## 2018-08-29 DIAGNOSIS — I10 Essential (primary) hypertension: Secondary | ICD-10-CM | POA: Diagnosis not present

## 2018-08-29 NOTE — Progress Notes (Signed)
   S:    PCP: Dr. Laural Benes  Patient arrives in good spirits. Presents to the clinic for hypertension management. Patient was referred by Dr. Laural Benes on 07/29/18. BP was elevated at that visit but patient's home pressures were well controlled. She was instructed to return to see me for a BP check today.  Patient reports adherence with medications.   Denies chest pains or dyspnea. No HA, blurred vision or dizziness. No BLE edema.   Current BP Medications include:  Amlodipine 10 mg daily, carvedilol 3.125 mg BID, losartan 100 mg daily  Antihypertensives tried in the past include: HCTZ (stopped d/t hypercalcemia)  Dietary habits include: limits salt, limits caffeine  Exercise habits include: denies Family / Social history:  - Fhx: DM, HTN (brother) - Never smoker - Denies drinking alcohol  Home BP readings:  - Brings wrist cuff - Readings at home typically range 110s-120s/60s-70s - 1 SBP over the past month >130 (131)  O:  L arm after 5 minutes rest: 112/73, HR 62 Last 3 Office BP readings: BP Readings from Last 3 Encounters:  08/29/18 112/73  07/29/18 (!) 142/82  03/27/18 113/73   BMET    Component Value Date/Time   NA 145 (H) 07/29/2018 1112   K 4.4 07/29/2018 1112   CL 106 07/29/2018 1112   CO2 23 07/29/2018 1112   GLUCOSE 91 07/29/2018 1112   GLUCOSE 90 08/03/2015 0942   BUN 8 07/29/2018 1112   CREATININE 0.93 07/29/2018 1112   CREATININE 0.87 08/03/2015 0942   CALCIUM 10.0 07/29/2018 1112   GFRNONAA 65 07/29/2018 1112   GFRNONAA 67 06/18/2014 1714   GFRAA 75 07/29/2018 1112   GFRAA 77 06/18/2014 1714   Renal function: CrCl cannot be calculated (Patient's most recent lab result is older than the maximum 21 days allowed.).  Clinical ASCVD: No   Patient with LDL >190.   A/P: Hypertension longstanding currently controlled on current medications. BP Goal <130/80 mmHg. Patient is adherent with current medications.  -Continued current regimen.  -Commended and  encouraged continued medication compliance.  -Counseled on lifestyle modifications for blood pressure control including reduced dietary sodium, increased exercise, adequate sleep -ASCVD: pt intolerant to statins with LDL of 221. She is currently only able to tolerate pravastatin 20 mg on MWF. Encouraged compliance.  -HM: UTD on appropriate vaccines   Results reviewed and written information provided.  Total time in face-to-face counseling 10 minutes.   F/U w/ PCP.   Butch Penny, PharmD, CPP Clinical Pharmacist [facility] [phone]

## 2018-08-29 NOTE — Patient Instructions (Signed)

## 2018-09-22 ENCOUNTER — Encounter: Payer: Self-pay | Admitting: Internal Medicine

## 2018-09-22 MED FILL — PRAVASTATIN SODIUM 20 MG TA: 20 | 28 days supply | Qty: 12 | Fill #0

## 2018-09-23 ENCOUNTER — Other Ambulatory Visit: Payer: Self-pay | Admitting: Internal Medicine

## 2018-09-23 MED FILL — AMLODIPINE BESYLATE 10 MG T: 10 | 30 days supply | Qty: 30 | Fill #5

## 2018-09-23 MED FILL — CARVEDILOL 3.125 MG TABLET: 3.125 | 30 days supply | Qty: 60 | Fill #0

## 2018-09-23 MED FILL — LOSARTAN POTASSIUM 100 MG T: 100 | 30 days supply | Qty: 30 | Fill #2

## 2018-10-20 MED FILL — PRAVASTATIN SODIUM 20 MG TA: 20 | 28 days supply | Qty: 12 | Fill #1

## 2018-10-21 ENCOUNTER — Other Ambulatory Visit: Payer: Self-pay | Admitting: Internal Medicine

## 2018-10-21 DIAGNOSIS — I10 Essential (primary) hypertension: Secondary | ICD-10-CM

## 2018-10-21 MED FILL — CARVEDILOL 3.125 MG TABLET: 3.125 | 30 days supply | Qty: 60 | Fill #1

## 2018-10-21 MED FILL — AMLODIPINE BESYLATE 10 MG T: 10 | 30 days supply | Qty: 30 | Fill #6

## 2018-10-22 MED FILL — LOSARTAN POTASSIUM 100 MG T: 100 | 30 days supply | Qty: 30 | Fill #0

## 2018-11-04 ENCOUNTER — Encounter: Payer: Self-pay | Admitting: Internal Medicine

## 2018-11-04 ENCOUNTER — Other Ambulatory Visit: Payer: Self-pay

## 2018-11-04 ENCOUNTER — Ambulatory Visit: Payer: Medicare Other | Attending: Internal Medicine | Admitting: Internal Medicine

## 2018-11-04 VITALS — BP 142/73 | HR 77 | Temp 98.2°F | Resp 18 | Ht 63.0 in | Wt 212.0 lb

## 2018-11-04 DIAGNOSIS — I10 Essential (primary) hypertension: Secondary | ICD-10-CM

## 2018-11-04 DIAGNOSIS — Z780 Asymptomatic menopausal state: Secondary | ICD-10-CM

## 2018-11-04 DIAGNOSIS — T887XXA Unspecified adverse effect of drug or medicament, initial encounter: Secondary | ICD-10-CM

## 2018-11-04 DIAGNOSIS — E669 Obesity, unspecified: Secondary | ICD-10-CM

## 2018-11-04 DIAGNOSIS — Z1239 Encounter for other screening for malignant neoplasm of breast: Secondary | ICD-10-CM

## 2018-11-04 MED ORDER — CARVEDILOL 6.25 MG PO TABS: 6.2500 mg | ORAL_TABLET | Freq: Two times a day (BID) | ORAL | 6 refills | Status: DC

## 2018-11-04 MED FILL — CARVEDILOL 6.25 MG TABLET: 6.25 | 30 days supply | Qty: 60 | Fill #0

## 2018-11-04 NOTE — Patient Instructions (Addendum)
Please give patient an appointment with Lurena Joiner in 2 weeks for blood pressure recheck.  Stop Cozaar. Increase carvedilol to 6.25 mg twice a day. Continue to monitor your blood pressure.

## 2018-11-04 NOTE — Progress Notes (Signed)
Patient ID: Kayla Lowe, female    DOB: [birth year]  MRN: [id number]  CC: Follow-up   Subjective: Kayla Lowe is a 63 y.o. female who presents for chronic ds management. Her concerns today include:  Pt with hx of HTN, HL, obesity, GERD and anxiety.  HYPERTENSION Currently taking: see medication list.  Did not take meds as yet for today Med Adherence: [x]  Yes    []  No Medication side effects: [x]  Yes.  Thinks she is having S.E from Cozaar - sweating, cough, tingling in legs.  She looked up S.E of Cozaar and found that it can caused some of these symptoms.  Wakes up at nights sweating.  Cough productive of clear phlegm.  Not worse during any particular time of day. No tickling or drainage in throat.  No sneezing.  She has not had any sick contacts.  No major weight changes. Adherence with salt restriction: [x]  Yes    []  No Home Monitoring?: [x]  Yes    []  No Monitoring Frequency: 1-2 x a day.  Home BP results range:  112/66, 114/66, 98/58, 114/66, 109/68 SOB? []  Yes    [x]  No Chest Pain?: []  Yes    [x]  No Leg swelling?: []  Yes    [x]  No Headaches?: []  Yes    [x]  No Dizziness? []  Yes    [x]  No Comments:   Obesity: Weight has:  stable Diet Adherence:  Trying to eat less sweets and starchy foods Exercising?  yes  - twice a day as weather permits.  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  See above     HL: Taking and tolerating Pravachol 3 times a week.   HM:  Due for MMG and Bone density.  She now has [place] health Medicare  Patient Active Problem List   Diagnosis Date Noted  . Seasonal allergic rhinitis due to pollen 08/12/2017  . Anxiety 11/30/2016  . 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 Other allergic rhinitis 11/20/2013  . Hyperlipidemia 09/10/2013  . Essential hypertension, benign 09/10/2013     Current Outpatient Medications on File Prior to Visit   Medication Sig Dispense Refill  . amLODipine (NORVASC) 10 MG tablet Take 1 tablet (10 mg total) by mouth every evening. 30 tablet 11  . aspirin EC 81 MG tablet Take 1 tablet (81 mg total) by mouth daily. 90 tablet 3  . cetirizine (ZYRTEC) 10 MG tablet Take 1 tablet (10 mg total) by mouth daily. 30 tablet 6  . co-enzyme Q-10 50 MG capsule Take 100 mg by mouth daily after lunch.    . cyclobenzaprine (FLEXERIL) 10 MG tablet Take 1 tablet (10 mg total) by mouth at bedtime. 30 tablet 1  . famotidine (PEPCID) 20 MG tablet Take 1 tablet (20 mg total) by mouth 2 (two) times daily. 60 tablet 4  . fluticasone (FLONASE) 50 MCG/ACT nasal spray Place 2 sprays into both nostrils daily. 16 g 2  . Omega-3 Fatty Acids (FISH OIL) 1000 MG CAPS Take 1,000 mg by mouth every evening.    . pravastatin (PRAVACHOL) 20 MG tablet Take 1 tablet (20 mg total) by mouth daily. Take Pravastatin 20 mg every M/W/F as directed by Dr. Wynetta Emery. 30 tablet 12   No current facility-administered medications on file prior to visit.     Allergies  Allergen Reactions  . Cozaar [Losartan Potassium]     Caused cough and sweats. Tingling in legs.  Marland Kitchen  Lisinopril Cough  . Statins Other (See Comments)    myalgias    Social History   Socioeconomic History  . Marital status: Legally Separated    Spouse name: Not on file  . Number of children: Not on file  . Years of education: Not on file  . Highest education level: Not on file  Occupational History  . Not on file  Social Needs  . Financial resource strain: Not on file  . Food insecurity    Worry: Not on file    Inability: Not on file  . Transportation needs    Medical: Not on file    Non-medical: Not on file  Tobacco Use  . Smoking status: Never Smoker  . Smokeless tobacco: Never Used  Substance and Sexual Activity  . Alcohol use: No  . Drug use: No  . Sexual activity: Not Currently  Lifestyle  . Physical activity    Days per week: Not on file    Minutes per  session: Not on file  . Stress: Not on file  Relationships  . Social Musicianconnections    Talks on phone: Not on file    Gets together: Not on file    Attends religious service: Not on file    Active member of club or organization: Not on file    Attends meetings of clubs or organizations: Not on file    Relationship status: Not on file  . Intimate partner violence    Fear of current or ex partner: Not on file    Emotionally abused: Not on file    Physically abused: Not on file    Forced sexual activity: Not on file  Other Topics Concern  . Not on file  Social History Narrative  . Not on file    Family History  Problem Relation Age of Onset  . Cancer Father   . Hypertension Brother   . Diabetes Brother   . Diabetes Paternal Grandfather   . Diabetes Paternal Uncle   . Colon cancer Neg Hx     Past Surgical History:  Procedure Laterality Date  . ABDOMINAL HYSTERECTOMY  1987    ROS: Review of Systems Negative except as stated above  PHYSICAL EXAM: BP (!) 142/73 (BP Location: Left Arm, Patient Position: Sitting, Cuff Size: Normal)   Pulse 77   Temp 98.2 F (36.8 C) (Oral)   Resp 18   Ht 5\' 3"  (1.6 m)   Wt 212 lb (96.2 kg)   SpO2 99%   BMI 37.55 kg/m   Wt Readings from Last 3 Encounters:  11/04/18 212 lb (96.2 kg)  07/29/18 213 lb (96.6 kg)  03/27/18 209 lb 3.2 oz (94.9 kg)   Physical Exam  General appearance - alert, well appearing,  older obese female and in no distress Mental status - alert, oriented to person, place, and time Neck - supple, no significant adenopathy Chest - clear to auscultation, no wheezes, rales or rhonchi, symmetric air entry Heart - normal rate, regular rhythm, normal S1, S2, no murmurs, rubs, clicks or gallops Extremities - peripheral pulses normal, no pedal edema, no clubbing or cyanosis   CMP Latest Ref Rng & Units 07/29/2018 01/09/2018 11/15/2017  Glucose 65 - 99 mg/dL 91 - -  BUN 8 - 27 mg/dL 8 - -  Creatinine 6.570.57 - 1.00 mg/dL 8.460.93 -  -  Sodium 962134 - 144 mmol/L 145(H) - -  Potassium 3.5 - 5.2 mmol/L 4.4 - -  Chloride 96 - 106 mmol/L 106 - -  CO2 20 - 29 mmol/L 23 - -  Calcium 8.7 - 10.3 mg/dL 16.110.0 09.610.3 10.8(H)  Total Protein 6.0 - 8.5 g/dL 7.0 - -  Total Bilirubin 0.0 - 1.2 mg/dL 0.5 - -  Alkaline Phos 39 - 117 IU/L 116 - -  AST 0 - 40 IU/L 14 - -  ALT 0 - 32 IU/L 12 - -   Lipid Panel     Component Value Date/Time   CHOL 303 (H) 11/15/2017 0911   TRIG 120 11/15/2017 0911   HDL 58 11/15/2017 0911   CHOLHDL 5.2 (H) 11/15/2017 0911   CHOLHDL 4.4 08/03/2015 0942   VLDL 21 08/03/2015 0942   LDLCALC 221 (H) 11/15/2017 0911   LDLDIRECT 159 (H) 09/05/2016 0838    CBC    Component Value Date/Time   WBC 5.5 07/29/2018 1112   WBC 6.1 07/17/2013 1740   RBC 4.33 07/29/2018 1112   RBC 4.50 07/17/2013 1740   HGB 13.2 07/29/2018 1112   HCT 39.6 07/29/2018 1112   PLT 189 07/29/2018 1112   MCV 92 07/29/2018 1112   MCH 30.5 07/29/2018 1112   MCH 30.4 07/17/2013 1740   MCHC 33.3 07/29/2018 1112   MCHC 33.9 07/17/2013 1740   RDW 13.1 07/29/2018 1112   LYMPHSABS 1.8 07/10/2013 0855   MONOABS 0.3 07/10/2013 0855   EOSABS 0.1 07/10/2013 0855   BASOSABS 0.0 07/10/2013 0855    ASSESSMENT AND PLAN: 1. Essential hypertension Not at goal but patient has not taken meds for the morning.  Home blood pressure readings have been good. Given the reported side effects that she is having from the Cozaar, I have told her to discontinue taking it and we will increase the carvedilol instead. Continue to monitor home blood pressure with goal of 130/80 or lower - carvedilol (COREG) 6.25 MG tablet; Take 1 tablet (6.25 mg total) by mouth 2 (two) times daily with a meal.  Dispense: 60 tablet; Refill: 6  2. Medication side effect See #1 above  3. Obesity (BMI 30-39.9) Commended her on moving more.  Encouraged her to continue doing so.  Dietary counseling given  4. Breast cancer screening - MM Digital Screening; Future  5.  Postmenopausal estrogen deficiency - DG Bone Density; Future   Patient was given the opportunity to ask questions.  Patient verbalized understanding of the plan and was able to repeat key elements of the plan.   Orders Placed This Encounter  Procedures  . MM Digital Screening  . DG Bone Density     Requested Prescriptions   Signed Prescriptions Disp Refills  . carvedilol (COREG) 6.25 MG tablet 60 tablet 6    Sig: Take 1 tablet (6.25 mg total) by mouth 2 (two) times daily with a meal.    Return in about 3 months (around 02/04/2019).  Jonah Blueeborah Johnson, MD, FACP

## 2018-11-20 MED FILL — PRAVASTATIN SODIUM 20 MG TA: 20 | 28 days supply | Qty: 12 | Fill #2

## 2018-11-21 ENCOUNTER — Other Ambulatory Visit (INDEPENDENT_AMBULATORY_CARE_PROVIDER_SITE_OTHER): Payer: Self-pay | Admitting: Internal Medicine

## 2018-11-21 DIAGNOSIS — J3089 Other allergic rhinitis: Secondary | ICD-10-CM

## 2018-11-21 MED FILL — AMLODIPINE BESYLATE 10 MG T: 10 | 30 days supply | Qty: 30 | Fill #7

## 2018-11-21 MED FILL — FLUTICASONE PROP 50 MCG SPR: 50 | 30 days supply | Qty: 16 | Fill #0

## 2018-11-25 ENCOUNTER — Encounter: Payer: Self-pay | Admitting: Pharmacist

## 2018-11-25 ENCOUNTER — Ambulatory Visit: Payer: Medicare Other | Attending: Family Medicine | Admitting: Pharmacist

## 2018-11-25 ENCOUNTER — Other Ambulatory Visit: Payer: Self-pay

## 2018-11-25 VITALS — BP 120/68 | HR 71

## 2018-11-25 DIAGNOSIS — I10 Essential (primary) hypertension: Secondary | ICD-10-CM | POA: Diagnosis not present

## 2018-11-25 NOTE — Progress Notes (Signed)
   S:    PCP: Dr. Wynetta Emery  Patient arrives anxious; she reports an increase in stress due to her brother's health and upcoming bypass surgery. Presents to the clinic for hypertension management. Patient was referred by Dr. Wynetta Emery on 11/04/18. Losartan was d/c d/t side effects and carvedilol dose was increased.  Patient reports adherence with medications.   Denies chest pains or dyspnea. Denies HA, blurred vision or dizziness. Denies BLE edema. She reports that her symptoms attributed to losartan use have subsided since stopping losartan.   Current BP Medications include:  Amlodipine 10 mg daily, carvedilol 6.25 mg BID  Antihypertensives tried in the past include: Lisinopril (cough), HCTZ (stopped d/t hypercalcemia), losartan (cough, sweats, LE tingling)  Dietary habits include: limits salt, limits caffeine Exercise habits include: denies Family / Social history:   - Fhx: DM, HTN (brother) - Tobacco: never smoker - Alcohol: denies use  Home BP readings:  - 100s-120s/70s - Two SBP outliers in the 140s  O:  L arm after 5 minutes rest: 120/68, HR 71 Last 3 Office BP readings: BP Readings from Last 3 Encounters:  11/04/18 (!) 142/73  08/29/18 112/73  07/29/18 (!) 142/82   BMET    Component Value Date/Time   NA 145 (H) 07/29/2018 1112   K 4.4 07/29/2018 1112   CL 106 07/29/2018 1112   CO2 23 07/29/2018 1112   GLUCOSE 91 07/29/2018 1112   GLUCOSE 90 08/03/2015 0942   BUN 8 07/29/2018 1112   CREATININE 0.93 07/29/2018 1112   CREATININE 0.87 08/03/2015 0942   CALCIUM 10.0 07/29/2018 1112   GFRNONAA 65 07/29/2018 1112   GFRNONAA 67 06/18/2014 1714   GFRAA 75 07/29/2018 1112   GFRAA 77 06/18/2014 1714   Renal function: CrCl cannot be calculated (Patient's most recent lab result is older than the maximum 21 days allowed.).  Clinical ASCVD: No   Patient with LDL >190.   A/P: Hypertension longstanding currently controlled on current medications. BP Goal <130/80 mmHg.  Patient is adherent with current medications.  -Continued her current regimen -Commended and encouraged continued medication compliance.  -Counseled on lifestyle modifications for blood pressure control including reduced dietary sodium, increased exercise, adequate sleep -ASCVD: pt intolerant to statins with LDL of 221. She is currently only able to tolerate pravastatin 20 mg on MWF. Encouraged compliance.  -HM: UTD on appropriate vaccines   Results reviewed and written information provided.  Total time in face-to-face counseling 10 minutes.   F/U w/ PCP.   Benard Halsted, PharmD, [place] [phone]

## 2018-11-25 NOTE — Patient Instructions (Signed)

## 2018-12-15 MED FILL — CARVEDILOL 6.25 MG TABLET: 6.25 | 30 days supply | Qty: 60 | Fill #1

## 2018-12-15 MED FILL — PRAVASTATIN SODIUM 20 MG TA: 20 | 28 days supply | Qty: 12 | Fill #3

## 2019-01-01 ENCOUNTER — Ambulatory Visit
Admission: RE | Admit: 2019-01-01 | Discharge: 2019-01-01 | Disposition: A | Discharge status: Home / Self Care | Payer: Medicare Other | Source: Ambulatory Visit | Attending: Internal Medicine | Admitting: Internal Medicine

## 2019-01-01 ENCOUNTER — Other Ambulatory Visit: Payer: Self-pay

## 2019-01-01 DIAGNOSIS — Z1239 Encounter for other screening for malignant neoplasm of breast: Secondary | ICD-10-CM

## 2019-01-01 DIAGNOSIS — Z780 Asymptomatic menopausal state: Secondary | ICD-10-CM

## 2019-01-15 MED FILL — PRAVASTATIN SODIUM 20 MG TA: 20 | 28 days supply | Qty: 12 | Fill #4

## 2019-01-20 MED FILL — CARVEDILOL 6.25 MG TABLET: 6.25 | 30 days supply | Qty: 60 | Fill #2

## 2019-01-20 MED FILL — AMLODIPINE BESYLATE 10 MG T: 10 | 30 days supply | Qty: 30 | Fill #8

## 2019-02-09 ENCOUNTER — Ambulatory Visit: Payer: Medicare Other | Attending: Internal Medicine | Admitting: Internal Medicine

## 2019-02-09 ENCOUNTER — Other Ambulatory Visit: Payer: Self-pay

## 2019-02-09 ENCOUNTER — Encounter: Payer: Self-pay | Admitting: Internal Medicine

## 2019-02-09 VITALS — BP 127/74 | HR 69 | Temp 98.8°F | Resp 18 | Ht 63.5 in | Wt 215.0 lb

## 2019-02-09 DIAGNOSIS — E669 Obesity, unspecified: Secondary | ICD-10-CM

## 2019-02-09 DIAGNOSIS — M85851 Other specified disorders of bone density and structure, right thigh: Secondary | ICD-10-CM | POA: Insufficient documentation

## 2019-02-09 DIAGNOSIS — I10 Essential (primary) hypertension: Secondary | ICD-10-CM | POA: Diagnosis not present

## 2019-02-09 DIAGNOSIS — K219 Gastro-esophageal reflux disease without esophagitis: Secondary | ICD-10-CM | POA: Insufficient documentation

## 2019-02-09 DIAGNOSIS — Z23 Encounter for immunization: Secondary | ICD-10-CM

## 2019-02-09 DIAGNOSIS — E785 Hyperlipidemia, unspecified: Secondary | ICD-10-CM

## 2019-02-09 MED ORDER — ESOMEPRAZOLE MAGNESIUM 20 MG PO CPDR: 20.0000 mg | DELAYED_RELEASE_CAPSULE | Freq: Every day | ORAL | 4 refills | Status: DC | PRN

## 2019-02-09 MED FILL — PRAVASTATIN SODIUM 20 MG TA: 20 | 28 days supply | Qty: 12 | Fill #5

## 2019-02-09 NOTE — Patient Instructions (Signed)

## 2019-02-09 NOTE — Progress Notes (Signed)
Patient ID: Kayla Lowe, female    DOB: [birth year]  MRN: [id number]  CC: Follow-up   Subjective: Kayla Lowe is a 63 y.o. female who presents for chronic disease management Concerns today include:  Pt with hx of HTN, HL, obesity, GERD and anxiety.  GERD:  "Pepcid not working."  Reports indigestion that "comes and goes."  Would like to try Nexium again.  This worked well.  -occurs mainly during the day not at nights -eats a lot of green leafy veggie.  Not on NSAIDs  HYPERTENSION Currently taking: see medication list Med Adherence: []  Yes    [x]  No Medication side effects: []  Yes    [x]  No Adherence with salt restriction: [x]  Yes    []  No Home Monitoring?: [x]  Yes    []  No Monitoring Frequency: []  Yes    []  No Home BP results range: 117/69, 106/63 SOB? []  Yes    [x]  No Chest Pain?: []  Yes    [x]  No Leg swelling?: []  Yes    [x]  No Headaches?: []  Yes    [x]  No Dizziness? []  Yes    [x]  No Comments:   HL:  Takes Pravachol 20 mg 3 x a wk.  Unable to tolerate daily dosing  BMD: osteopenia on BMD.  Purchased Ca+ Vit  500/200.  Has it with her today for further instructions Walking 30 mins 4 x a wk Obesity:  Eating habits better.  No fried foods, more greens and smaller portion sizes.  Drinks mainly water  Patient Active Problem List   Diagnosis Date Noted  . Gastroesophageal reflux disease without esophagitis 02/09/2019  . Osteopenia of neck of right femur 02/09/2019  . Seasonal allergic rhinitis due to pollen 08/12/2017  . Anxiety 11/30/2016  . 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 Other allergic rhinitis 11/20/2013  . Hyperlipidemia 09/10/2013  . Essential hypertension, benign 09/10/2013     Current Outpatient Medications on File Prior to Visit  Medication Sig Dispense Refill  . calcium-vitamin D (OSCAL WITH D) 500-200 MG-UNIT tablet Take 1 tablet by mouth 2 (two) times daily.    amLODipine (NORVASC) 10 MG tablet Take 1 tablet (10  mg total) by mouth every evening. 30 tablet 11  . aspirin EC 81 MG tablet Take 1 tablet (81 mg total) by mouth daily. 90 tablet 3  . carvedilol (COREG) 6.25 MG tablet Take 1 tablet (6.25 mg total) by mouth 2 (two) times daily with a meal. 60 tablet 6  . cetirizine (ZYRTEC) 10 MG tablet Take 1 tablet (10 mg total) by mouth daily. 30 tablet 6  . co-enzyme Q-10 50 MG capsule Take 100 mg by mouth daily after lunch.    . cyclobenzaprine (FLEXERIL) 10 MG tablet Take 1 tablet (10 mg total) by mouth at bedtime. 30 tablet 1  . fluticasone (FLONASE) 50 MCG/ACT nasal spray PLACE 2 SPRAYS INTO BOTH NOSTRILS DAILY. 16 g 2  . Omega-3 Fatty Acids (FISH OIL) 1000 MG CAPS Take 1,000 mg by mouth every evening.    . pravastatin (PRAVACHOL) 20 MG tablet Take 1 tablet (20 mg total) by mouth daily. Take Pravastatin 20 mg every M/W/F as directed by Dr. . 30 tablet 12   No current facility-administered medications on file prior to visit.     Allergies  Allergen Reactions  . Cozaar [Losartan Potassium]     Caused cough and sweats. Tingling in legs.  . Lisinopril Cough  . Statins Other (See Comments)    myalgias  Social History   Socioeconomic History  . Marital status: Legally Separated    Spouse name: Not on file  . Number of children: Not on file  . Years of education: Not on file  . Highest education level: Not on file  Occupational History  . Not on file  Social Needs  . Financial resource strain: Not on file  . Food insecurity    Worry: Not on file    Inability: Not on file  . Transportation needs    Medical: Not on file    Non-medical: Not on file  Tobacco Use  . Smoking status: Never Smoker  . Smokeless tobacco: Never Used  Substance and Sexual Activity  . Alcohol use: No  . Drug use: No  . Sexual activity: Not Currently  Lifestyle  . Physical activity    Days per week: Not on file    Minutes per session: Not on file  . Stress: Not on file  Relationships  . Social  Musicianconnections    Talks on phone: Not on file    Gets together: Not on file    Attends religious service: Not on file    Active member of club or organization: Not on file    Attends meetings of clubs or organizations: Not on file    Relationship status: Not on file  . Intimate partner violence    Fear of current or ex partner: Not on file    Emotionally abused: Not on file    Physically abused: Not on file    Forced sexual activity: Not on file  Other Topics Concern  . Not on file  Social History Narrative  . Not on file    Family History  Problem Relation Age of Onset  . Cancer Father   . Hypertension Brother   . Diabetes Brother   . Diabetes Paternal Grandfather   . Diabetes Paternal Uncle   . Colon cancer Neg Hx     Past Surgical History:  Procedure Laterality Date  . ABDOMINAL HYSTERECTOMY  1987    ROS: Review of Systems Negative except as stated above  PHYSICAL EXAM: BP 127/74 (BP Location: Left Arm, Patient Position: Sitting, Cuff Size: Large)   Pulse 69   Temp 98.8 F (37.1 C) (Oral)   Resp 18   Ht 5' 3.5" (1.613 m)   Wt 215 lb (97.5 kg)   SpO2 100%   BMI 37.49 kg/m   Wt Readings from Last 3 Encounters:  02/09/19 215 lb (97.5 kg)  11/04/18 212 lb (96.2 kg)  07/29/18 213 lb (96.6 kg)    Physical Exam  General appearance - alert, well appearing, and in no distress Mental status - normal mood, behavior, speech, dress, motor activity, and thought processes Eyes - pupils equal and reactive, extraocular eye movements intact Mouth - mucous membranes moist, pharynx normal without lesions Neck - supple, no significant adenopathy Chest - clear to auscultation, no wheezes, rales or rhonchi, symmetric air entry Heart - normal rate, regular rhythm, normal S1, S2, no murmurs, rubs, clicks or gallops Extremities - peripheral pulses normal, no pedal edema, no clubbing or cyanosis   CMP Latest Ref Rng & Units 07/29/2018 01/09/2018 11/15/2017  Glucose 65 - 99 mg/dL  91 - -  BUN 8 - 27 mg/dL 8 - -  Creatinine 1.610.57 - 1.00 mg/dL 0.960.93 - -  Sodium 045134 - 144 mmol/L 145(H) - -  Potassium 3.5 - 5.2 mmol/L 4.4 - -  Chloride 96 - 106 mmol/L  106 - -  CO2 20 - 29 mmol/L 23 - -  Calcium 8.7 - 10.3 mg/dL 10.0 10.3 10.8(H)  Total Protein 6.0 - 8.5 g/dL 7.0 - -  Total Bilirubin 0.0 - 1.2 mg/dL 0.5 - -  Alkaline Phos 39 - 117 IU/L 116 - -  AST 0 - 40 IU/L 14 - -  ALT 0 - 32 IU/L 12 - -   Lipid Panel     Component Value Date/Time   CHOL 303 (H) 11/15/2017 0911   TRIG 120 11/15/2017 0911   HDL 58 11/15/2017 0911   CHOLHDL 5.2 (H) 11/15/2017 0911   CHOLHDL 4.4 08/03/2015 0942   VLDL 21 08/03/2015 0942   LDLCALC 221 (H) 11/15/2017 0911   LDLDIRECT 159 (H) 09/05/2016 0838    CBC    Component Value Date/Time   WBC 5.5 07/29/2018 1112   WBC 6.1 07/17/2013 1740   RBC 4.33 07/29/2018 1112   RBC 4.50 07/17/2013 1740   HGB 13.2 07/29/2018 1112   HCT 39.6 07/29/2018 1112   PLT 189 07/29/2018 1112   MCV 92 07/29/2018 1112   MCH 30.5 07/29/2018 1112   MCH 30.4 07/17/2013 1740   MCHC 33.3 07/29/2018 1112   MCHC 33.9 07/17/2013 1740   RDW 13.1 07/29/2018 1112   LYMPHSABS 1.8 07/10/2013 0855   MONOABS 0.3 07/10/2013 0855   EOSABS 0.1 07/10/2013 0855   BASOSABS 0.0 07/10/2013 0855    ASSESSMENT AND PLAN: 1. Essential hypertension At goal Continue current medication  2. Obesity (BMI 30-39.9) Commended her on staying active and in changing her eating habits.  Encouraged her to keep up the good work  3. Hyperlipidemia, unspecified hyperlipidemia type Continue Pravachol 3 times a week  4. Osteopenia of neck of right femur Advised patient to take the Os-Cal 500/200 BID  5. Gastroesophageal reflux disease without esophagitis GERD precautions discussed - esomeprazole (NEXIUM) 20 MG capsule; Take 1 capsule (20 mg total) by mouth daily as needed.  Dispense: 30 capsule; Refill: 4  6. Need for immunization against influenza given  Patient was given the  opportunity to ask questions.  Patient verbalized understanding of the plan and was able to repeat key elements of the plan.   No orders of the defined types were placed in this encounter.    Requested Prescriptions   Signed Prescriptions Disp Refills  . esomeprazole (NEXIUM) 20 MG capsule 30 capsule 4    Sig: Take 1 capsule (20 mg total) by mouth daily as needed.    Return in about 3 months (around 05/11/2019).  Karle Plumber, MD, FACP

## 2019-02-16 MED FILL — AMLODIPINE BESYLATE 10 MG T: 10 | 30 days supply | Qty: 30 | Fill #9

## 2019-02-16 MED FILL — CARVEDILOL 6.25 MG TABLET: 6.25 | 30 days supply | Qty: 60 | Fill #3

## 2019-03-09 MED FILL — PRAVASTATIN SODIUM 20 MG TA: 20 | 28 days supply | Qty: 12 | Fill #6

## 2019-03-23 MED FILL — AMLODIPINE BESYLATE 10 MG T: 10 | 30 days supply | Qty: 30 | Fill #10

## 2019-03-23 MED FILL — CARVEDILOL 6.25 MG TABLET: 6.25 | 30 days supply | Qty: 60 | Fill #4

## 2019-04-07 ENCOUNTER — Other Ambulatory Visit: Payer: Self-pay | Admitting: Internal Medicine

## 2019-04-07 DIAGNOSIS — E785 Hyperlipidemia, unspecified: Secondary | ICD-10-CM

## 2019-04-28 ENCOUNTER — Other Ambulatory Visit: Payer: Self-pay | Admitting: Internal Medicine

## 2019-04-28 DIAGNOSIS — I10 Essential (primary) hypertension: Secondary | ICD-10-CM

## 2019-04-28 MED FILL — AMLODIPINE BESYLATE 10 MG T: 10 | 30 days supply | Qty: 30 | Fill #0

## 2019-04-28 MED FILL — CARVEDILOL 6.25 MG TABLET: 6.25 | 30 days supply | Qty: 60 | Fill #5

## 2019-05-11 MED FILL — PRAVASTATIN SODIUM 20 MG TA: 20 | 28 days supply | Qty: 12 | Fill #1

## 2019-05-21 ENCOUNTER — Other Ambulatory Visit: Payer: Self-pay

## 2019-05-21 ENCOUNTER — Ambulatory Visit: Payer: Medicare Other | Attending: Internal Medicine | Admitting: Internal Medicine

## 2019-05-21 ENCOUNTER — Encounter: Payer: Self-pay | Admitting: Internal Medicine

## 2019-05-21 DIAGNOSIS — E669 Obesity, unspecified: Secondary | ICD-10-CM | POA: Diagnosis not present

## 2019-05-21 DIAGNOSIS — E785 Hyperlipidemia, unspecified: Secondary | ICD-10-CM | POA: Diagnosis not present

## 2019-05-21 DIAGNOSIS — R252 Cramp and spasm: Secondary | ICD-10-CM

## 2019-05-21 DIAGNOSIS — I10 Essential (primary) hypertension: Secondary | ICD-10-CM

## 2019-05-21 MED ORDER — CARVEDILOL 6.25 MG PO TABS: 6.2500 mg | ORAL_TABLET | Freq: Two times a day (BID) | ORAL | 6 refills | Status: DC

## 2019-05-21 MED ORDER — PRAVASTATIN SODIUM 20 MG PO TABS: ORAL_TABLET | ORAL | 3 refills | Status: DC

## 2019-05-21 MED ORDER — CYCLOBENZAPRINE HCL 10 MG PO TABS: 10.0000 mg | ORAL_TABLET | Freq: Every day | ORAL | 1 refills | Status: DC

## 2019-05-21 MED FILL — CARVEDILOL 6.25 MG TABLET: 6.25 | 30 days supply | Qty: 60 | Fill #0

## 2019-05-21 MED FILL — CYCLOBENZAPRINE 10 MG TAB: 10 | 30 days supply | Qty: 30 | Fill #0

## 2019-05-21 NOTE — Progress Notes (Signed)
Virtual Visit via Telephone Note Due to current restrictions/limitations of in-office visits due to the COVID-19 pandemic, this scheduled clinical appointment was converted to a telehealth visit  I connected with Kayla Lowe on 05/21/19 at 8:44 a.m by telephone and verified that I am speaking with the correct person using two identifiers. I am in my office.  The patient is at home.  Only the patient and myself participated in this encounter.  I discussed the limitations, risks, security and privacy concerns of performing an evaluation and management service by telephone and the availability of in person appointments. I also discussed with the patient that there may be a patient responsible charge related to this service. The patient expressed understanding and agreed to proceed.   History of Present Illness: Pt with hx of HTN, HL, obesity, GERD and anxiety.Last seen 02/09/2019.  HYPERTENSION Currently taking: see medication list Med Adherence: [x]  Yes    []  No Medication side effects: []  Yes    [x]  No Adherence with salt restriction: [x]  Yes    []  No Home Monitoring?: [x]  Yes    []  No Monitoring Frequency:  Most recent readings 121/74, P 76; 113/51, P78; 97/54, P 76 Home BP results range:  SOB? []  Yes    [x]  No Chest Pain?: []  Yes    [x]  No Leg swelling?: []  Yes    [x]  No Headaches?: []  Yes    [x]  No Dizziness? []  Yes    [x]  No Comments:   HL:  Taking Pravastatin 3 x wk.  Muscle cramps every now and then   Obesity:  wgh on last visit was 215 lbs.  Reports wgh 2 wks ago was 213 lbs. Over eat on christmas day. Wanting to try a diet called [employer] that involves a powdered protein shake for BF and lunch then a regular dinner.  She shakes on nuts and celery sticks. Got some wghs for christmas  (set of 3, 5 and 8 lbs) and will start a yoga program 3 x a wk starting next wk  Needs RF on Coreg Request RF on Flexeril to use as needed for muscle spasm Outpatient Encounter  Medications as of 05/21/2019  Medication Sig  . amLODipine (NORVASC) 10 MG tablet TAKE 1 TABLET (10 MG TOTAL) BY MOUTH EVERY EVENING.  Marland Kitchen aspirin EC 81 MG tablet Take 1 tablet (81 mg total) by mouth daily.  . calcium-vitamin D (OSCAL WITH D) 500-200 MG-UNIT tablet Take 1 tablet by mouth 2 (two) times daily.  . carvedilol (COREG) 6.25 MG tablet Take 1 tablet (6.25 mg total) by mouth 2 (two) times daily with a meal.  . cetirizine (ZYRTEC) 10 MG tablet Take 1 tablet (10 mg total) by mouth daily.  Marland Kitchen co-enzyme Q-10 50 MG capsule Take 100 mg by mouth daily after lunch.  . cyclobenzaprine (FLEXERIL) 10 MG tablet Take 1 tablet (10 mg total) by mouth at bedtime.  Marland Kitchen esomeprazole (NEXIUM) 20 MG capsule Take 1 capsule (20 mg total) by mouth daily as needed.  . fluticasone (FLONASE) 50 MCG/ACT nasal spray PLACE 2 SPRAYS INTO BOTH NOSTRILS DAILY.  Marland Kitchen Omega-3 Fatty Acids (FISH OIL) 1000 MG CAPS Take 1,000 mg by mouth every evening.  . pravastatin (PRAVACHOL) 20 MG tablet TAKE 1 TABLET BY MOUTH EVERY MONDAY, WEDNESDAY, AND FRIDAY AS DIRECTED BY DR. Wynetta Emery.   No facility-administered encounter medications on file as of 05/21/2019.    Observations/Objective: Results for orders placed or performed in visit on 07/29/18  CBC  Result Value Ref Range  WBC 5.5 3.4 - 10.8 x10E3/uL   RBC 4.33 3.77 - 5.28 x10E6/uL   Hemoglobin 13.2 11.1 - 15.9 g/dL   Hematocrit 38.4 66.5 - 46.6 %   MCV 92 79 - 97 fL   MCH 30.5 26.6 - 33.0 pg   MCHC 33.3 31.5 - 35.7 g/dL   RDW 99.3 57.0 - 17.7 %   Platelets 189 150 - 450 x10E3/uL  Comprehensive metabolic panel  Result Value Ref Range   Glucose 91 65 - 99 mg/dL   BUN 8 8 - 27 mg/dL   Creatinine, Ser 9.39 0.57 - 1.00 mg/dL   GFR calc non Af Amer 65 >59 mL/min/1.73   GFR calc Af Amer 75 >59 mL/min/1.73   BUN/Creatinine Ratio 9 (L) 12 - 28   Sodium 145 (H) 134 - 144 mmol/L   Potassium 4.4 3.5 - 5.2 mmol/L   Chloride 106 96 - 106 mmol/L   CO2 23 20 - 29 mmol/L   Calcium 10.0  8.7 - 10.3 mg/dL   Total Protein 7.0 6.0 - 8.5 g/dL   Albumin 4.2 3.8 - 4.8 g/dL   Globulin, Total 2.8 1.5 - 4.5 g/dL   Albumin/Globulin Ratio 1.5 1.2 - 2.2   Bilirubin Total 0.5 0.0 - 1.2 mg/dL   Alkaline Phosphatase 116 39 - 117 IU/L   AST 14 0 - 40 IU/L   ALT 12 0 - 32 IU/L     Assessment and Plan: 1. Essential hypertension Reported blood pressure readings are at goal.  She will continue carvedilol and amlodipine. - carvedilol (COREG) 6.25 MG tablet; Take 1 tablet (6.25 mg total) by mouth 2 (two) times daily with a meal.  Dispense: 60 tablet; Refill: 6  2. Hyperlipidemia, unspecified hyperlipidemia type Tolerating Pravachol by taking it 3 times a week. - pravastatin (PRAVACHOL) 20 MG tablet; TAKE 1 TABLET BY MOUTH EVERY MONDAY, WEDNESDAY, AND FRIDAY AS DIRECTED BY DR. Laural Benes.  Dispense: 12 tablet; Refill: 3  3. Obesity (BMI 30-39.9) Commended her on efforts to lose weight.  However I think having 2 out of 3 meals as protein shakes is a bit restrictive.  Advised that if she wants to do the protein shakes I would do it just once a day.  Commended her on eating healthy snacks.  Commended her on starting an exercise program.  Advised her to start low and go slow  4. Nocturnal muscle cramps - cyclobenzaprine (FLEXERIL) 10 MG tablet; Take 1 tablet (10 mg total) by mouth at bedtime.  Dispense: 30 tablet; Refill: 1   Follow Up Instructions: 3-4 mths   I discussed the assessment and treatment plan with the patient. The patient was provided an opportunity to ask questions and all were answered. The patient agreed with the plan and demonstrated an understanding of the instructions.   The patient was advised to call back or seek an in-person evaluation if the symptoms worsen or if the condition fails to improve as anticipated.  I provided 17 minutes of non-face-to-face time during this encounter.   Jonah Blue, MD

## 2019-05-27 MED FILL — AMLODIPINE BESYLATE 10 MG T: 10 | 30 days supply | Qty: 30 | Fill #1

## 2019-06-02 MED FILL — PRAVASTATIN SODIUM 20 MG TA: 20 | 28 days supply | Qty: 12 | Fill #2

## 2019-06-22 ENCOUNTER — Other Ambulatory Visit: Payer: Self-pay

## 2019-06-22 NOTE — Patient Outreach (Signed)
[facility]) Care Management  06/22/2019  Kayla Lowe 1955-06-17 [id number]   Medication Adherence call to [name] HIPPA Compliant Voice message left with a call back number. [name] is showing past due on Losartan 100 mg under [facility].  Lillia Abed CPhT Pharmacy Technician [facility] Management Direct Dial [phone]  Fax [phone] [email or web address]

## 2019-06-29 MED FILL — PRAVASTATIN SODIUM 20 MG TA: 20 | 28 days supply | Qty: 12 | Fill #3

## 2019-06-29 MED FILL — CARVEDILOL 6.25 MG TABLET: 6.25 | 30 days supply | Qty: 60 | Fill #1

## 2019-06-29 MED FILL — AMLODIPINE BESYLATE 10 MG T: 10 | 30 days supply | Qty: 30 | Fill #2

## 2019-07-16 ENCOUNTER — Ambulatory Visit: Payer: Medicare Other | Attending: Internal Medicine

## 2019-07-16 DIAGNOSIS — Z23 Encounter for immunization: Secondary | ICD-10-CM | POA: Insufficient documentation

## 2019-07-16 NOTE — Progress Notes (Signed)
   Covid-19 Vaccination Clinic  Name:  Kayla Lowe    MRN: [id number] DOB: Sep 21, 1955  07/16/2019  [name] was observed post Covid-19 immunization for 15 minutes without incident. She was provided with Vaccine Information Sheet and instruction to access the V-Safe system.   [name] was instructed to call 911 with any severe reactions post vaccine: Marland Kitchen Difficulty breathing  . Swelling of face and throat  . A fast heartbeat  . A bad rash all over body  . Dizziness and weakness   Immunizations Administered    Name Date Dose VIS Date Route   Pfizer COVID-19 Vaccine 07/16/2019  2:38 AM 0.3 mL 04/24/2019 Intramuscular   Manufacturer: [employer], [employer]   Lot: [id number]   NDC: [address]-4

## 2019-07-20 ENCOUNTER — Other Ambulatory Visit: Payer: Self-pay

## 2019-07-20 ENCOUNTER — Encounter: Payer: Self-pay | Admitting: Internal Medicine

## 2019-07-20 ENCOUNTER — Ambulatory Visit (INDEPENDENT_AMBULATORY_CARE_PROVIDER_SITE_OTHER): Payer: Medicare Other | Admitting: Internal Medicine

## 2019-07-20 VITALS — BP 134/78 | HR 89 | Temp 98.2°F | Resp 16 | Ht 63.5 in | Wt 216.2 lb

## 2019-07-20 DIAGNOSIS — G5762 Lesion of plantar nerve, left lower limb: Secondary | ICD-10-CM | POA: Diagnosis not present

## 2019-07-20 MED ORDER — RELAFEN DS 1000 MG PO TABS: 1.0000 | ORAL_TABLET | Freq: Every day | ORAL | 0 refills | Status: DC | PRN

## 2019-07-20 NOTE — Progress Notes (Signed)
Subjective:  Patient ID: Kayla Lowe, female    DOB: 1956-05-02  Age: 64 y.o. MRN: [id number]  CC: Foot Pain  This visit occurred during the SARS-CoV-2 public health emergency.  Safety protocols were in place, including screening questions prior to the visit, additional usage of staff PPE, and extensive cleaning of exam room while observing appropriate contact time as indicated for disinfecting solutions.    HPI Kayla Lowe presents for concerns about left foot pain.  She complains of a 2-week history of sharp, stabbing pain with no history of trauma.  She denies redness, swelling, or paresthesias.  She says the pain is located in the webspace between the great toe, second toe, and third toe.  Outpatient Medications Prior to Visit  Medication Sig Dispense Refill   amLODipine (NORVASC) 10 MG tablet TAKE 1 TABLET (10 MG TOTAL) BY MOUTH EVERY EVENING. 30 tablet 9   aspirin EC 81 MG tablet Take 1 tablet (81 mg total) by mouth daily. 90 tablet 3   calcium-vitamin D (OSCAL WITH D) 500-200 MG-UNIT tablet Take 1 tablet by mouth 2 (two) times daily.     carvedilol (COREG) 6.25 MG tablet Take 1 tablet (6.25 mg total) by mouth 2 (two) times daily with a meal. 60 tablet 6   cetirizine (ZYRTEC) 10 MG tablet Take 1 tablet (10 mg total) by mouth daily. 30 tablet 6   co-enzyme Q-10 50 MG capsule Take 100 mg by mouth daily after lunch.     cyclobenzaprine (FLEXERIL) 10 MG tablet Take 1 tablet (10 mg total) by mouth at bedtime. 30 tablet 1   esomeprazole (NEXIUM) 20 MG capsule Take 1 capsule (20 mg total) by mouth daily as needed. 30 capsule 4   fluticasone (FLONASE) 50 MCG/ACT nasal spray PLACE 2 SPRAYS INTO BOTH NOSTRILS DAILY. 16 g 2   Omega-3 Fatty Acids (FISH OIL) 1000 MG CAPS Take 1,000 mg by mouth every evening.     pravastatin (PRAVACHOL) 20 MG tablet TAKE 1 TABLET BY MOUTH EVERY MONDAY, WEDNESDAY, AND FRIDAY AS DIRECTED BY DR. Laural Benes. 12 tablet 3   No  facility-administered medications prior to visit.    ROS Review of Systems  Genitourinary: Negative.   Musculoskeletal: Positive for arthralgias.  Skin: Negative for color change and rash.  Hematological: Negative.   All other systems reviewed and are negative.   Objective:  BP 134/78 (BP Location: Right Arm, Patient Position: Sitting, Cuff Size: Normal)    Pulse 89    Temp 98.2 F (36.8 C) (Oral)    Resp 16    Ht 5' 3.5" (1.613 m)    Wt 216 lb 4 oz (98.1 kg)    SpO2 98%    BMI 37.71 kg/m   BP Readings from Last 3 Encounters:  07/20/19 134/78  02/09/19 127/74  11/25/18 120/68    Wt Readings from Last 3 Encounters:  07/20/19 216 lb 4 oz (98.1 kg)  02/09/19 215 lb (97.5 kg)  11/04/18 212 lb (96.2 kg)    Physical Exam Vitals reviewed.  Constitutional:      Appearance: Normal appearance.  HENT:     Mouth/Throat:     Mouth: Mucous membranes are moist.  Cardiovascular:     Rate and Rhythm: Normal rate and regular rhythm.     Pulses:          Dorsalis pedis pulses are 2+ on the right side and 2+ on the left side.       Posterior tibial pulses are 2+ on  the right side and 2+ on the left side.     Heart sounds: No murmur.  Pulmonary:     Effort: Pulmonary effort is normal.     Breath sounds: No stridor. No wheezing, rhonchi or rales.  Abdominal:     General: Abdomen is flat. Bowel sounds are normal.     Palpations: There is no hepatomegaly, splenomegaly or mass.     Tenderness: There is no abdominal tenderness.  Musculoskeletal:     Cervical back: Neck supple.     Right foot: Normal range of motion. No deformity.     Left foot: Normal range of motion. No deformity.  Feet:     Right foot:     Skin integrity: Skin integrity normal.     Toenail Condition: Right toenails are normal.     Left foot:     Skin integrity: Skin integrity normal.     Toenail Condition: Left toenails are normal.     Comments: Tenderness to palpation in the first and second web spaces. Skin:     General: Skin is warm and dry.     Findings: No rash.  Neurological:     General: No focal deficit present.     Mental Status: She is alert.  Psychiatric:        Mood and Affect: Mood normal.        Behavior: Behavior normal.     Lab Results  Component Value Date   WBC 5.5 07/29/2018   HGB 13.2 07/29/2018   HCT 39.6 07/29/2018   PLT 189 07/29/2018   GLUCOSE 91 07/29/2018   CHOL 303 (H) 11/15/2017   TRIG 120 11/15/2017   HDL 58 11/15/2017   LDLDIRECT 159 (H) 09/05/2016   LDLCALC 221 (H) 11/15/2017   ALT 12 07/29/2018   AST 14 07/29/2018   NA 145 (H) 07/29/2018   K 4.4 07/29/2018   CL 106 07/29/2018   CREATININE 0.93 07/29/2018   BUN 8 07/29/2018   CO2 23 07/29/2018   TSH 2.471 07/17/2013   INR 0.97 07/10/2013   HGBA1C 5.4 11/30/2016    DG Bone Density  Result Date: 01/01/2019 EXAM: DUAL X-RAY ABSORPTIOMETRY (DXA) FOR BONE MINERAL DENSITY IMPRESSION: Referring Physician:  Ladell Pier Your patient completed a BMD test using Lunar IDXA DXA system ( analysis version: 16 ) manufactured by [employer]. Technologist: CG PATIENT: Name: Kayla Lowe, Kayla Lowe Patient ID:  [id number] Birth Date: [birth year] Height:     64.0 in. Sex:         Female Measured:   01/01/2019 Weight:     214.4 lbs. 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 This patient is considered OSTEOPENIC according to [facility]) criteria. The scan quality is good. Lumbar spine was not utilized due to advanced degenerative. Site Region Measured Date Measured Age YA T-score BMD Significant CHANGE DualFemur Neck Right 01/01/2019 65.4 -1.3 0.854 g/cm2 DualFemur Total Mean 01/01/2019 65.4 -0.7 0.914 g/cm2 Right Forearm Radius 33% 01/01/2019 65.4 -0.7 0.828 g/cm2 World  Health Organization [facility]) criteria for post-menopausal, Caucasian Women: Normal       T-score at or above -1 SD Osteopenia   T-score between -1 and -2.5 SD Osteoporosis T-score at or below -2.5 SD RECOMMENDATION: 1. All patients should optimize calcium and vitamin D intake. 2. Consider FDA approved medical therapies in  postmenopausal women and men aged 75 years and older, based on the following: a. A hip or vertebral (clinical or morphometric) fracture b. T- score < or = -2.5 at the femoral neck or spine after appropriate evaluation to exclude secondary causes c. Low bone mass (T-score between -1.0 and -2.5 at the femoral neck or spine) and a 10 year probability of a hip fracture > or = 3% or a 10 year probability of a major osteoporosis-related fracture > or = 20% based on the [place]-adapted WHO algorithm d. Clinician judgment and/or patient preferences may indicate treatment for people with 10-year fracture probabilities above or below these levels FOLLOW-UP: Patients with diagnosis of osteoporosis or at high risk for fracture should have regular bone mineral density tests. For patients eligible for Medicare, routine testing is allowed once every 2 years. The testing frequency can be increased to one year for patients who have rapidly progressing disease, those who are receiving or discontinuing medical therapy to restore bone mass, or have additional risk factors. [facility] FRAX* 10-year Probability of Fracture Based on femoral neck BMD: DualFemur (Right) Major Osteoporotic Fracture: 4.5% Hip Fracture:                0.4% Population:                  [place] ([place]) Risk Factors:                Rheumatoid Arthritis (714.0) *FRAX is a Armed forces logistics/support/administrative officer of the [employer] of [employer] for Metabolic Bone Disease, a World Science writer (WHO) [employer]. ASSESSMENT: The probability of a major osteoporotic fracture is 4.5% within the next ten years. The probability of a hip fracture is  0.4% within the next ten years. I have reviewed this report and agree with the above findings. Mark A. Tyron Russell, M.D. [facility] Electronically Signed   By: Ulyses Southward M.D.   On: 01/01/2019 12:40   MM 3D SCREEN BREAST BILATERAL  Result Date: 01/01/2019 CLINICAL DATA:  Screening. EXAM: DIGITAL SCREENING BILATERAL MAMMOGRAM WITH TOMO AND CAD COMPARISON:  Previous exam(s). ACR Breast Density Category b: There are scattered areas of fibroglandular density. FINDINGS: There are no findings suspicious for malignancy. Images were processed with CAD. IMPRESSION: No mammographic evidence of malignancy. A result letter of this screening mammogram will be mailed directly to the patient. RECOMMENDATION: Screening mammogram in one year. (Code:SM-B-01Y) BI-RADS CATEGORY  1: Negative. Electronically Signed   By: Sherian Rein M.D.   On: 01/01/2019 18:10    Assessment & Plan:   Ayanni was seen today for foot pain.  Diagnoses and all orders for this visit:  Morton's neuroma of left foot- Will treat with an oral NSAID.  I gave her patient education about self-care modalities.  I also recommended that she see podiatry to see if she needs to undergo a steroid injection. -     Ambulatory referral to Podiatry -     Nabumetone (RELAFEN DS) 1000 MG TABS; Take 1 tablet by mouth daily as needed.   I am having Geoffrey Hopkins-Washington start on Relafen DS. I am also having her maintain her co-enzyme Q-10, Fish Oil, aspirin EC, cetirizine, fluticasone, calcium-vitamin D, esomeprazole, amLODipine, carvedilol, pravastatin, and cyclobenzaprine.  Meds ordered this encounter  Medications   Nabumetone (RELAFEN DS) 1000 MG TABS    Sig: Take 1 tablet by mouth daily as needed.    Dispense:  10 tablet    Refill:  0  Follow-up: No follow-ups on file.  Scarlette Calico, MD

## 2019-07-20 NOTE — Patient Instructions (Signed)

## 2019-08-03 ENCOUNTER — Ambulatory Visit (INDEPENDENT_AMBULATORY_CARE_PROVIDER_SITE_OTHER): Payer: Medicare Other

## 2019-08-03 ENCOUNTER — Other Ambulatory Visit: Payer: Self-pay | Admitting: Internal Medicine

## 2019-08-03 ENCOUNTER — Telehealth: Payer: Self-pay

## 2019-08-03 ENCOUNTER — Other Ambulatory Visit: Payer: Self-pay

## 2019-08-03 ENCOUNTER — Encounter: Payer: Self-pay | Admitting: Podiatry

## 2019-08-03 ENCOUNTER — Ambulatory Visit: Payer: Medicare Other | Admitting: Podiatry

## 2019-08-03 VITALS — BP 119/78 | HR 83 | Temp 97.3°F | Resp 14

## 2019-08-03 DIAGNOSIS — M779 Enthesopathy, unspecified: Secondary | ICD-10-CM

## 2019-08-03 DIAGNOSIS — G5762 Lesion of plantar nerve, left lower limb: Secondary | ICD-10-CM

## 2019-08-03 DIAGNOSIS — M79672 Pain in left foot: Secondary | ICD-10-CM

## 2019-08-03 MED ORDER — RELAFEN DS 1000 MG PO TABS: 1.0000 | ORAL_TABLET | Freq: Every day | ORAL | 1 refills | Status: DC | PRN

## 2019-08-03 NOTE — Telephone Encounter (Signed)
1.Medication Requested: Nabumetone (RELAFEN DS) 1000 MG TABS  2. Pharmacy (Name, Street, City):[facility] [phone] - [place], [place] - [address] AT [facility] [address]  3. On Med List: Yes   4. Last Visit with PCP: 3.8.21  5. Next visit date with PCP: 5.5.2021    Agent: Please be advised that RX refills may take up to 3 business days. We ask that you follow-up with your pharmacy.

## 2019-08-04 ENCOUNTER — Telehealth: Payer: Self-pay | Admitting: Internal Medicine

## 2019-08-04 ENCOUNTER — Encounter: Payer: Self-pay | Admitting: Internal Medicine

## 2019-08-04 NOTE — Telephone Encounter (Signed)
   Patient states she is no longer a patient at [facility]- Dr Laural Benes She wants to know if Dr Yetta Barre will write new prescription for : amLODipine (NORVASC) 10 MG tablet carvedilol (COREG) 6.25 MG tablet pravastatin (PRAVACHOL) 20 MG tablet  Patient will use [facility] Please advise

## 2019-08-04 NOTE — Progress Notes (Signed)
Subjective:   Patient ID: Kayla Lowe, female   DOB: 64 y.o.   MRN: [id number]   HPI 64 year old female presents the office today for concerns of left foot pain.  She states that she almost feels that there is something inside of her shoe.  She follow-up with her primary care physician and diagnosed with a Morton's neuroma.  She was given anti-inflammatories which has been helpful.  She states the pain is not anywhere near as bad as what it was previously.  This is been ongoing for 1/2 months and started after increased exercise.  No recent injury.  Minimal swelling.  She does not get any numbness or tingling into the toes but she feels a pinching sensation.   Review of Systems  All other systems reviewed and are negative.  Past Medical History:  Diagnosis Date  . GERD (gastroesophageal reflux disease)   . Hyperlipidemia   . Hypertension   . Osteoarthritis     Past Surgical History:  Procedure Laterality Date  . ABDOMINAL HYSTERECTOMY  1987     Current Outpatient Medications:  .  amLODipine (NORVASC) 10 MG tablet, TAKE 1 TABLET (10 MG TOTAL) BY MOUTH EVERY EVENING., Disp: 30 tablet, Rfl: 9 .  aspirin EC 81 MG tablet, Take 1 tablet (81 mg total) by mouth daily., Disp: 90 tablet, Rfl: 3 .  calcium-vitamin D (OSCAL WITH D) 500-200 MG-UNIT tablet, Take 1 tablet by mouth 2 (two) times daily., Disp: , Rfl:  .  carvedilol (COREG) 6.25 MG tablet, Take 1 tablet (6.25 mg total) by mouth 2 (two) times daily with a meal., Disp: 60 tablet, Rfl: 6 .  cetirizine (ZYRTEC) 10 MG tablet, Take 1 tablet (10 mg total) by mouth daily., Disp: 30 tablet, Rfl: 6 .  co-enzyme Q-10 50 MG capsule, Take 100 mg by mouth daily after lunch., Disp: , Rfl:  .  cyclobenzaprine (FLEXERIL) 10 MG tablet, Take 1 tablet (10 mg total) by mouth at bedtime., Disp: 30 tablet, Rfl: 1 .  esomeprazole (NEXIUM) 20 MG capsule, Take 1 capsule (20 mg total) by mouth daily as needed., Disp: 30 capsule, Rfl: 4 .   fluticasone (FLONASE) 50 MCG/ACT nasal spray, PLACE 2 SPRAYS INTO BOTH NOSTRILS DAILY., Disp: 16 g, Rfl: 2 .  Nabumetone (RELAFEN DS) 1000 MG TABS, Take 1 tablet by mouth daily as needed., Disp: 90 tablet, Rfl: 1 .  Omega-3 Fatty Acids (FISH OIL) 1000 MG CAPS, Take 1,000 mg by mouth every evening., Disp: , Rfl:  .  pravastatin (PRAVACHOL) 20 MG tablet, TAKE 1 TABLET BY MOUTH EVERY MONDAY, WEDNESDAY, AND FRIDAY AS DIRECTED BY DR. Laural Benes., Disp: 12 tablet, Rfl: 3  Allergies  Allergen Reactions  . Cozaar [Losartan Potassium]     Caused cough and sweats. Tingling in legs.  . Lisinopril Cough  . Statins Other (See Comments)    myalgias          Objective:  Physical Exam  General: AAO x3, NAD  Dermatological: Skin is warm, dry and supple bilateral. Nails x 10 are well manicured; remaining integument appears unremarkable at this time. There are no open sores, no preulcerative lesions, no rash or signs of infection present.  Vascular: Dorsalis Pedis artery and Posterior Tibial artery pedal pulses are 2/4 bilateral with immedate capillary fill time. Pedal hair growth present. No varicosities and no lower extremity edema present bilateral. There is no pain with calf compression, swelling, warmth, erythema.   Neruologic: Grossly intact via light touch bilateral.   Musculoskeletal: Prominence  of metatarsal heads plantarly.  Majority tenderness to the second interspace of the left foot.  She reports that pain has improved.  There is no area of pinpoint tenderness.  There is no significant edema, erythema.  No other areas of discomfort identified.  Flexor, extensor tendons appear to be intact.  Muscular strength 5/5 in all groups tested bilateral.  Gait: Unassisted, Nonantalgic.       Assessment:   Left foot neuroma, capsulitis    Plan:  -Treatment options discussed including all alternatives, risks, and complications -Etiology of symptoms were discussed -X-rays were obtained and reviewed  with the patient.  There is no evidence of acute fracture or stress fracture identified today. -Discussed steroid injection but she wants to hold off on this today as overall she states that she is feeling better.  Continue anti-inflammatories.  Ice daily.  I added a metatarsal pad to her insert.  If symptoms continue or worsen she will consider injection.  Return in about 4 weeks (around 08/31/2019).  Trula Slade DPM

## 2019-08-05 ENCOUNTER — Other Ambulatory Visit: Payer: Self-pay | Admitting: Internal Medicine

## 2019-08-05 DIAGNOSIS — I10 Essential (primary) hypertension: Secondary | ICD-10-CM

## 2019-08-05 DIAGNOSIS — E785 Hyperlipidemia, unspecified: Secondary | ICD-10-CM

## 2019-08-05 MED ORDER — AMLODIPINE BESYLATE 10 MG PO TABS: 10.0000 mg | ORAL_TABLET | Freq: Every evening | ORAL | 1 refills | Status: DC

## 2019-08-05 MED ORDER — CARVEDILOL 6.25 MG PO TABS: 6.2500 mg | ORAL_TABLET | Freq: Two times a day (BID) | ORAL | 1 refills | Status: DC

## 2019-08-05 MED ORDER — PRAVASTATIN SODIUM 20 MG PO TABS: ORAL_TABLET | ORAL | 1 refills | Status: DC

## 2019-08-05 NOTE — Telephone Encounter (Signed)
Pt is requesting PCP to fill the amlodipine, carvedilol and pravastatin.   I can send if you agree. Please advise.

## 2019-08-12 ENCOUNTER — Ambulatory Visit: Payer: Medicare Other | Attending: Internal Medicine

## 2019-08-12 DIAGNOSIS — Z23 Encounter for immunization: Secondary | ICD-10-CM

## 2019-08-12 NOTE — Progress Notes (Signed)
   Covid-19 Vaccination Clinic  Name:  Kataya Guimont    MRN: [id number] DOB: 1955-12-19  08/12/2019  [name] was observed post Covid-19 immunization for 15 minutes without incident. She was provided with Vaccine Information Sheet and instruction to access the V-Safe system.   [name] was instructed to call 911 with any severe reactions post vaccine: Marland Kitchen Difficulty breathing  . Swelling of face and throat  . A fast heartbeat  . A bad rash all over body  . Dizziness and weakness   Immunizations Administered    Name Date Dose VIS Date Route   Pfizer COVID-19 Vaccine 08/12/2019 10:18 AM 0.3 mL 04/24/2019 Intramuscular   Manufacturer: [employer], [employer]   Lot: [id number]   NDC: [address]-4

## 2019-08-13 ENCOUNTER — Other Ambulatory Visit: Payer: Self-pay | Admitting: Internal Medicine

## 2019-08-13 ENCOUNTER — Encounter: Payer: Self-pay | Admitting: Internal Medicine

## 2019-08-13 ENCOUNTER — Telehealth: Payer: Self-pay | Admitting: Internal Medicine

## 2019-08-13 DIAGNOSIS — G5762 Lesion of plantar nerve, left lower limb: Secondary | ICD-10-CM

## 2019-08-13 MED ORDER — NABUMETONE 500 MG PO TABS: 500.0000 mg | ORAL_TABLET | Freq: Two times a day (BID) | ORAL | 1 refills | Status: DC | PRN

## 2019-08-13 NOTE — Telephone Encounter (Signed)
New message:    Pt is calling and stating that her insurance will not pay for Nabumetone (RELAFEN DS) 1000 MG TABS and needs and alternative medication sent into [facility] - [place], [place] - [address] Please advise.

## 2019-08-19 ENCOUNTER — Other Ambulatory Visit: Payer: Self-pay | Admitting: Internal Medicine

## 2019-08-19 NOTE — Telephone Encounter (Signed)
Per PCP this has been taken care of.

## 2019-08-19 NOTE — Telephone Encounter (Signed)
Do you want to send in an alternative or for a prior auth to be started?

## 2019-08-31 ENCOUNTER — Ambulatory Visit: Payer: Medicare Other | Admitting: Podiatry

## 2019-08-31 LAB — HEMOGLOBIN A1C: Hemoglobin A1C: 5.4

## 2019-09-01 ENCOUNTER — Ambulatory Visit: Payer: Medicare Other | Admitting: Podiatry

## 2019-09-01 ENCOUNTER — Other Ambulatory Visit: Payer: Self-pay

## 2019-09-01 DIAGNOSIS — G5762 Lesion of plantar nerve, left lower limb: Secondary | ICD-10-CM | POA: Diagnosis not present

## 2019-09-02 NOTE — Progress Notes (Signed)
Subjective: 64 year old female presents the office today for follow-up evaluation of left foot pain, neuroma.  She said that she is doing well and the medication has been helpful.  He is also been icing and using metatarsal pads.  She currently denies any pain.  She has no other concerns today.  No edema, erythema. Denies any systemic complaints such as fevers, chills, nausea, vomiting. No acute changes since last appointment, and no other complaints at this time.   Objective: AAO x3, NAD DP/PT pulses palpable bilaterally, CRT less than 3 seconds At this time I am not able to elicit any area of tenderness on the left foot.  Particular is no pain on the area of the interspace of the left foot.  There is no area pinpoint tenderness.  There is no edema, erythema any signs of infection. No pain with calf compression, swelling, warmth, erythema  Assessment: Resolving left foot pain, neuroma  Plan: -All treatment options discussed with the patient including all alternatives, risks, complications.  -Overall she is doing better.  I want her to start the hold off on the anti-inflammatories.  Continue the metatarsal pads and wearing supportive shoes, inserts. -Patient encouraged to call the office with any questions, concerns, change in symptoms.   Vivi Barrack DPM

## 2019-09-16 ENCOUNTER — Ambulatory Visit (INDEPENDENT_AMBULATORY_CARE_PROVIDER_SITE_OTHER): Payer: Medicare Other | Admitting: Internal Medicine

## 2019-09-16 ENCOUNTER — Encounter: Payer: Self-pay | Admitting: Internal Medicine

## 2019-09-16 ENCOUNTER — Other Ambulatory Visit: Payer: Self-pay

## 2019-09-16 VITALS — BP 136/84 | HR 81 | Temp 98.9°F | Resp 16 | Ht 63.5 in | Wt 215.0 lb

## 2019-09-16 DIAGNOSIS — J3089 Other allergic rhinitis: Secondary | ICD-10-CM

## 2019-09-16 DIAGNOSIS — M85851 Other specified disorders of bone density and structure, right thigh: Secondary | ICD-10-CM

## 2019-09-16 DIAGNOSIS — Z23 Encounter for immunization: Secondary | ICD-10-CM

## 2019-09-16 DIAGNOSIS — I10 Essential (primary) hypertension: Secondary | ICD-10-CM

## 2019-09-16 DIAGNOSIS — E7801 Familial hypercholesterolemia: Secondary | ICD-10-CM | POA: Insufficient documentation

## 2019-09-16 DIAGNOSIS — Z0000 Encounter for general adult medical examination without abnormal findings: Secondary | ICD-10-CM

## 2019-09-16 DIAGNOSIS — J301 Allergic rhinitis due to pollen: Secondary | ICD-10-CM

## 2019-09-16 DIAGNOSIS — E559 Vitamin D deficiency, unspecified: Secondary | ICD-10-CM

## 2019-09-16 DIAGNOSIS — E785 Hyperlipidemia, unspecified: Secondary | ICD-10-CM | POA: Diagnosis not present

## 2019-09-16 LAB — CBC WITH DIFFERENTIAL/PLATELET
Basophils Absolute: 0 10*3/uL (ref 0.0–0.1)
Basophils Relative: 0.4 % (ref 0.0–3.0)
Eosinophils Absolute: 0.2 10*3/uL (ref 0.0–0.7)
Eosinophils Relative: 3.6 % (ref 0.0–5.0)
HCT: 37.8 % (ref 36.0–46.0)
Hemoglobin: 12.7 g/dL (ref 12.0–15.0)
Lymphocytes Relative: 29.3 % (ref 12.0–46.0)
Lymphs Abs: 1.9 10*3/uL (ref 0.7–4.0)
MCHC: 33.5 g/dL (ref 30.0–36.0)
MCV: 93.9 fl (ref 78.0–100.0)
Monocytes Absolute: 0.5 10*3/uL (ref 0.1–1.0)
Monocytes Relative: 7 % (ref 3.0–12.0)
Neutro Abs: 4 10*3/uL (ref 1.4–7.7)
Neutrophils Relative %: 59.7 % (ref 43.0–77.0)
Platelets: 326 10*3/uL (ref 150.0–400.0)
RBC: 4.02 Mil/uL (ref 3.87–5.11)
RDW: 14.6 % (ref 11.5–15.5)
WBC: 6.6 10*3/uL (ref 4.0–10.5)

## 2019-09-16 LAB — BASIC METABOLIC PANEL
BUN: 8 mg/dL (ref 6–23)
CO2: 25 mEq/L (ref 19–32)
Calcium: 10.2 mg/dL (ref 8.4–10.5)
Chloride: 105 mEq/L (ref 96–112)
Creatinine, Ser: 1.04 mg/dL (ref 0.40–1.20)
GFR: 64.12 mL/min (ref >60.00)
Glucose, Bld: 133 mg/dL — ABNORMAL HIGH (ref 70–99)
Potassium: 3.9 mEq/L (ref 3.5–5.1)
Sodium: 140 mEq/L (ref 135–145)

## 2019-09-16 LAB — HEPATIC FUNCTION PANEL
ALT: 11 U/L (ref 0–35)
AST: 12 U/L (ref 0–37)
Albumin: 3.9 g/dL (ref 3.5–5.2)
Alkaline Phosphatase: 110 U/L (ref 39–117)
Bilirubin, Direct: 0 mg/dL (ref 0.0–0.3)
Total Bilirubin: 0.4 mg/dL (ref 0.2–1.2)
Total Protein: 6.9 g/dL (ref 6.0–8.3)

## 2019-09-16 LAB — URINALYSIS, ROUTINE W REFLEX MICROSCOPIC
Bilirubin Urine: NEGATIVE
Hgb urine dipstick: NEGATIVE
Ketones, ur: NEGATIVE
Nitrite: NEGATIVE
RBC / HPF: NONE SEEN (ref 0–?)
Specific Gravity, Urine: 1.01 (ref 1.000–1.030)
Total Protein, Urine: NEGATIVE
Urine Glucose: NEGATIVE
Urobilinogen, UA: 0.2 (ref 0.0–1.0)
pH: 5.5 (ref 5.0–8.0)

## 2019-09-16 LAB — LDL CHOLESTEROL, DIRECT: Direct LDL: 220 mg/dL

## 2019-09-16 LAB — LIPID PANEL
Cholesterol: 326 mg/dL — ABNORMAL HIGH (ref 0–200)
HDL: 46.4 mg/dL (ref >39.00)
NonHDL: 279.9
Total CHOL/HDL Ratio: 7
Triglycerides: 281 mg/dL — ABNORMAL HIGH (ref 0.0–149.0)
VLDL: 56.2 mg/dL — ABNORMAL HIGH (ref 0.0–40.0)

## 2019-09-16 LAB — TSH: TSH: 1.8 u[IU]/mL (ref 0.35–4.50)

## 2019-09-16 LAB — VITAMIN D 25 HYDROXY (VIT D DEFICIENCY, FRACTURES): VITD: 17.84 ng/mL — ABNORMAL LOW (ref 30.00–100.00)

## 2019-09-16 MED ORDER — CHOLECALCIFEROL 1.25 MG (50000 UT) PO CAPS: 50000.0000 [IU] | ORAL_CAPSULE | ORAL | 0 refills | Status: DC

## 2019-09-16 MED ORDER — METHYLPREDNISOLONE 4 MG PO TBPK: ORAL_TABLET | ORAL | 0 refills | Status: AC

## 2019-09-16 MED ORDER — MONTELUKAST SODIUM 10 MG PO TABS: 10.0000 mg | ORAL_TABLET | Freq: Every day | ORAL | 1 refills | Status: DC

## 2019-09-16 MED ORDER — NEXLIZET 180-10 MG PO TABS: 1.0000 | ORAL_TABLET | Freq: Every day | ORAL | 1 refills | Status: DC

## 2019-09-16 NOTE — Patient Instructions (Signed)

## 2019-09-16 NOTE — Progress Notes (Signed)
Subjective:  Patient ID: Kayla Lowe, female    DOB: 01-10-1956  Age: 64 y.o. MRN: [id number]  CC: Hyperlipidemia, Allergic Rhinitis , and Hypertension  This visit occurred during the SARS-CoV-2 public health emergency.  Safety protocols were in place, including screening questions prior to the visit, additional usage of staff PPE, and extensive cleaning of exam room while observing appropriate contact time as indicated for disinfecting solutions.    HPI Kayla Lowe presents for f/up - She complains of a 5-day history of runny nose, clear nasal phlegm, nasal congestion, and postnasal drip.  She denies facial pain, cough, sore throat, fever, or chills.  She is not getting much symptom relief with Flonase and Zyrtec.  Outpatient Medications Prior to Visit  Medication Sig Dispense Refill  . amLODipine (NORVASC) 10 MG tablet Take 1 tablet (10 mg total) by mouth every evening. 90 tablet 1  . aspirin EC 81 MG tablet Take 1 tablet (81 mg total) by mouth daily. 90 tablet 3  . calcium-vitamin D (OSCAL WITH D) 500-200 MG-UNIT tablet Take 1 tablet by mouth 2 (two) times daily.    . carvedilol (COREG) 6.25 MG tablet Take 1 tablet (6.25 mg total) by mouth 2 (two) times daily with a meal. 180 tablet 1  . cetirizine (ZYRTEC) 10 MG tablet Take 1 tablet (10 mg total) by mouth daily. 30 tablet 6  . co-enzyme Q-10 50 MG capsule Take 100 mg by mouth daily after lunch.    . cyclobenzaprine (FLEXERIL) 10 MG tablet Take 1 tablet (10 mg total) by mouth at bedtime. 30 tablet 1  . esomeprazole (NEXIUM) 20 MG capsule Take 1 capsule (20 mg total) by mouth daily as needed. 30 capsule 4  . fluticasone (FLONASE) 50 MCG/ACT nasal spray PLACE 2 SPRAYS INTO BOTH NOSTRILS DAILY. 16 g 2  . nabumetone (RELAFEN) 500 MG tablet Take 1 tablet (500 mg total) by mouth 2 (two) times daily as needed. 180 tablet 1  . Omega-3 Fatty Acids (FISH OIL) 1000 MG CAPS Take 1,000 mg by mouth every evening.    .  pravastatin (PRAVACHOL) 20 MG tablet TAKE 1 TABLET BY MOUTH EVERY MONDAY, WEDNESDAY, AND FRIDAY AS DIRECTED BY DR. Laural Benes. 45 tablet 1   No facility-administered medications prior to visit.    ROS Review of Systems  Constitutional: Negative for chills, diaphoresis, fatigue and fever.  HENT: Positive for congestion, postnasal drip, rhinorrhea and sneezing. Negative for facial swelling, nosebleeds, sinus pressure, sinus pain, sore throat, trouble swallowing and voice change.   Eyes: Negative.   Respiratory: Negative for cough, chest tightness, shortness of breath and wheezing.   Cardiovascular: Negative for chest pain, palpitations and leg swelling.  Gastrointestinal: Negative for abdominal pain, constipation, diarrhea, nausea and vomiting.  Endocrine: Negative.   Genitourinary: Negative.  Negative for difficulty urinating.  Musculoskeletal: Negative.  Negative for arthralgias and myalgias.  Skin: Negative.  Negative for color change, pallor and rash.  Neurological: Negative.  Negative for dizziness, weakness and light-headedness.  Hematological: Negative for adenopathy. Does not bruise/bleed easily.  Psychiatric/Behavioral: Negative.     Objective:  BP 136/84 (BP Location: Left Arm, Patient Position: Sitting, Cuff Size: Large)   Pulse 81   Temp 98.9 F (37.2 C) (Oral)   Resp 16   Ht 5' 3.5" (1.613 m)   Wt 215 lb (97.5 kg)   SpO2 96%   BMI 37.49 kg/m   BP Readings from Last 3 Encounters:  09/16/19 136/84  08/03/19 119/78  07/20/19 134/78  Wt Readings from Last 3 Encounters:  09/16/19 215 lb (97.5 kg)  07/20/19 216 lb 4 oz (98.1 kg)  02/09/19 215 lb (97.5 kg)    Physical Exam Vitals reviewed.  Constitutional:      Appearance: She is obese.  HENT:     Right Ear: Hearing, tympanic membrane, ear canal and external ear normal.     Left Ear: Hearing, tympanic membrane, ear canal and external ear normal.     Nose: Mucosal edema and congestion present. No rhinorrhea.      Right Nostril: No epistaxis.     Right Turbinates: Not pale.     Left Turbinates: Not pale.     Right Sinus: No maxillary sinus tenderness or frontal sinus tenderness.     Left Sinus: No maxillary sinus tenderness or frontal sinus tenderness.     Mouth/Throat:     Mouth: Mucous membranes are moist.  Eyes:     General: No scleral icterus.    Conjunctiva/sclera: Conjunctivae normal.  Cardiovascular:     Rate and Rhythm: Normal rate and regular rhythm.     Heart sounds: No murmur.  Pulmonary:     Effort: Pulmonary effort is normal.     Breath sounds: No stridor. No wheezing, rhonchi or rales.  Abdominal:     General: Abdomen is protuberant. Bowel sounds are normal. There is no distension.     Palpations: Abdomen is soft. There is no hepatomegaly, splenomegaly or mass.     Tenderness: There is no abdominal tenderness.  Musculoskeletal:        General: Normal range of motion.     Cervical back: Neck supple.     Right lower leg: No edema.     Left lower leg: No edema.  Lymphadenopathy:     Cervical: No cervical adenopathy.  Skin:    General: Skin is warm and dry.     Coloration: Skin is not pale.  Neurological:     General: No focal deficit present.     Mental Status: She is alert.  Psychiatric:        Mood and Affect: Mood normal.        Behavior: Behavior normal.     Lab Results  Component Value Date   WBC 6.6 09/16/2019   HGB 12.7 09/16/2019   HCT 37.8 09/16/2019   PLT 326.0 09/16/2019   GLUCOSE 133 (H) 09/16/2019   CHOL 326 (H) 09/16/2019   TRIG 281.0 (H) 09/16/2019   HDL 46.40 09/16/2019   LDLDIRECT 220.0 09/16/2019   LDLCALC 221 (H) 11/15/2017   ALT 11 09/16/2019   AST 12 09/16/2019   NA 140 09/16/2019   K 3.9 09/16/2019   CL 105 09/16/2019   CREATININE 1.04 09/16/2019   BUN 8 09/16/2019   CO2 25 09/16/2019   TSH 1.80 09/16/2019   INR 0.97 07/10/2013   HGBA1C 5.4 08/31/2019    DG Bone Density  Result Date: 01/01/2019 EXAM: DUAL X-RAY ABSORPTIOMETRY  (DXA) FOR BONE MINERAL DENSITY IMPRESSION: Referring Physician:  Marcine MatarEBORAH B JOHNSON Your patient completed a BMD test using Lunar IDXA DXA system ( analysis version: 16 ) manufactured by [employer]. Technologist: CG PATIENT: Name: Kayla Lowe, Kayla Lowe, Kayla Lowe Patient ID:  [id number] Birth Date: [birth year] Height:     64.0 in. Sex:         Female Measured:   01/01/2019 Weight:     214.4 lbs. 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 This patient is considered OSTEOPENIC according to [facility]) criteria. The scan quality is good. Lumbar spine was not utilized due to advanced degenerative. Site Region Measured Date Measured Age YA T-score BMD Significant CHANGE DualFemur Neck Right 01/01/2019 65.4 -1.3 0.854 g/cm2 DualFemur Total Mean 01/01/2019 65.4 -0.7 0.914 g/cm2 Right Forearm Radius 33% 01/01/2019 65.4 -0.7 0.828 g/cm2 World Health Organization [facility]) criteria for post-menopausal, Caucasian Women: Normal       T-score at or above -1 SD Osteopenia   T-score between -1 and -2.5 SD Osteoporosis T-score at or below -2.5 SD RECOMMENDATION: 1. All patients should optimize calcium and vitamin D intake. 2. Consider FDA approved medical therapies in postmenopausal women and men aged 7 years and older, based on the following: a. A hip or vertebral (clinical or morphometric) fracture b. T- score < or = -2.5 at the femoral neck or spine after appropriate evaluation to exclude secondary causes c. Low bone mass (T-score between -1.0 and -2.5 at the femoral neck or spine) and a 10 year probability of a hip fracture > or = 3% or a 10 year probability of a major osteoporosis-related fracture > or = 20% based on the [place]-adapted WHO algorithm d. Clinician judgment and/or patient  preferences may indicate treatment for people with 10-year fracture probabilities above or below these levels FOLLOW-UP: Patients with diagnosis of osteoporosis or at high risk for fracture should have regular bone mineral density tests. For patients eligible for Medicare, routine testing is allowed once every 2 years. The testing frequency can be increased to one year for patients who have rapidly progressing disease, those who are receiving or discontinuing medical therapy to restore bone mass, or have additional risk factors. [facility] FRAX* 10-year Probability of Fracture Based on femoral neck BMD: DualFemur (Right) Major Osteoporotic Fracture: 4.5% Hip Fracture:                0.4% Population:                  [place] (Black) Risk Factors:                Rheumatoid Arthritis (714.0) *FRAX is a Materials engineer of the [employer] for Metabolic Bone Disease, a [place] (WHO) [facility]. ASSESSMENT: The probability of a major osteoporotic fracture is 4.5% within the next ten years. The probability of a hip fracture is 0.4% within the next ten years. I have reviewed this report and agree with the above findings. Mark A. Thornton Papas, M.D. [facility] Electronically Signed   By: Lavonia Dana M.D.   On: 01/01/2019 12:40   MM 3D SCREEN BREAST BILATERAL  Result Date: 01/01/2019 CLINICAL DATA:  Screening. EXAM: DIGITAL SCREENING BILATERAL MAMMOGRAM WITH TOMO AND CAD COMPARISON:  Previous exam(s). ACR Breast Density Category b: There are scattered areas of fibroglandular density. FINDINGS: There are no findings suspicious for malignancy. Images were processed with CAD. IMPRESSION: No mammographic evidence of malignancy. A result letter of this screening mammogram will be mailed directly to the patient. RECOMMENDATION: Screening mammogram in one year. (Code:SM-B-01Y) BI-RADS CATEGORY  1: Negative. Electronically Signed   By: Abelardo Diesel M.D.   On:  01/01/2019 18:10    Assessment & Plan:   Kayla Lowe was seen today for hyperlipidemia, allergic rhinitis  and hypertension.  Diagnoses and all orders for this visit:  Essential hypertension, benign- Her  blood pressure is adequately well controlled. -     CBC with Differential/Platelet; Future -     Basic metabolic panel; Future -     TSH; Future -     Urinalysis, Routine w reflex microscopic; Future -     Urinalysis, Routine w reflex microscopic -     TSH -     Basic metabolic panel -     CBC with Differential/Platelet  Dyslipidemia, goal LDL below 130- Her LDL remains way too high.  I have asked her to increase the dose of the statin and to add bempedoic acid and ezetimibe. -     Lipid panel; Future -     TSH; Future -     Hepatic function panel; Future -     Hepatic function panel -     TSH -     Lipid panel -     Bempedoic Acid-Ezetimibe (NEXLIZET) 180-10 MG TABS; Take 1 tablet by mouth daily. -     pravastatin (PRAVACHOL) 20 MG tablet; Take 1 tablet (20 mg total) by mouth daily.  Other allergic rhinitis  Seasonal allergic rhinitis due to pollen -     montelukast (SINGULAIR) 10 MG tablet; Take 1 tablet (10 mg total) by mouth at bedtime. -     methylPREDNISolone (MEDROL DOSEPAK) 4 MG TBPK tablet; TAKE AS DIRECTED  Osteopenia of neck of right femur -     VITAMIN D 25 Hydroxy (Vit-D Deficiency, Fractures); Future -     VITAMIN D 25 Hydroxy (Vit-D Deficiency, Fractures) -     Cholecalciferol 1.25 MG (50000 UT) capsule; Take 1 capsule (50,000 Units total) by mouth once a week.  Need for pneumococcal vaccination -     Pneumococcal polysaccharide vaccine 23-valent greater than or equal to 2yo subcutaneous/IM  Familial hypercholesteremia- She has not achieved her LDL goal so I have asked her to increase the pravastatin dose to QD and to add ezetimibe and bempedoic acid. -     Bempedoic Acid-Ezetimibe (NEXLIZET) 180-10 MG TABS; Take 1 tablet by mouth daily.  Vitamin D deficiency  disease -     Cholecalciferol 1.25 MG (50000 UT) capsule; Take 1 capsule (50,000 Units total) by mouth once a week.  Hyperlipidemia, unspecified hyperlipidemia type  Routine general medical examination at a health care facility- Exam completed, labs reviewed, vaccines reviewed and updated, screening for colon and breast cancer is up-to-date, she defers on cervical cancer screening, patient education is given.  Other orders -     LDL cholesterol, direct   I have changed Kayla Lowe's pravastatin. I am also having her start on montelukast, methylPREDNISolone, Nexlizet, and Cholecalciferol. Additionally, I am having her maintain her co-enzyme Q-10, Fish Oil, aspirin EC, cetirizine, fluticasone, calcium-vitamin D, esomeprazole, cyclobenzaprine, carvedilol, amLODipine, and nabumetone.  Meds ordered this encounter  Medications  . montelukast (SINGULAIR) 10 MG tablet    Sig: Take 1 tablet (10 mg total) by mouth at bedtime.    Dispense:  90 tablet    Refill:  1  . methylPREDNISolone (MEDROL DOSEPAK) 4 MG TBPK tablet    Sig: TAKE AS DIRECTED    Dispense:  21 tablet    Refill:  0  . Bempedoic Acid-Ezetimibe (NEXLIZET) 180-10 MG TABS    Sig: Take 1 tablet by mouth daily.    Dispense:  90 tablet    Refill:  1  . Cholecalciferol 1.25 MG (50000 UT) capsule    Sig: Take 1 capsule (50,000 Units total) by mouth once a week.  Dispense:  12 capsule    Refill:  0  . pravastatin (PRAVACHOL) 20 MG tablet    Sig: Take 1 tablet (20 mg total) by mouth daily.    Dispense:  90 tablet    Refill:  1   In addition to time spent on CPE, I spent 50 minutes in preparing to see the patient by review of recent labs, imaging and procedures, obtaining and reviewing separately obtained history, communicating with the patient and family or caregiver, ordering medications, tests or procedures, and documenting clinical information in the EHR including the differential Dx, treatment, and any further  evaluation and other management of 1. Essential hypertension, benign 2. Dyslipidemia, goal LDL below 130 3. Seasonal allergic rhinitis due to pollen 4. Osteopenia of neck of right femur 5. Familial hypercholesteremia 8. Vitamin D deficiency disease      Follow-up: Return in about 6 months (around 03/18/2020).  Sanda Linger, MD

## 2019-09-17 MED ORDER — PRAVASTATIN SODIUM 20 MG PO TABS: 20.0000 mg | ORAL_TABLET | Freq: Every day | ORAL | 1 refills | Status: DC

## 2019-09-18 DIAGNOSIS — Z0000 Encounter for general adult medical examination without abnormal findings: Secondary | ICD-10-CM | POA: Insufficient documentation

## 2019-09-18 DIAGNOSIS — E559 Vitamin D deficiency, unspecified: Secondary | ICD-10-CM | POA: Insufficient documentation

## 2019-09-18 DIAGNOSIS — E785 Hyperlipidemia, unspecified: Secondary | ICD-10-CM | POA: Insufficient documentation

## 2019-09-22 ENCOUNTER — Ambulatory Visit: Payer: Medicare Other | Admitting: Internal Medicine

## 2019-11-22 ENCOUNTER — Encounter: Payer: Self-pay | Admitting: Internal Medicine

## 2019-11-23 ENCOUNTER — Other Ambulatory Visit: Payer: Self-pay | Admitting: Internal Medicine

## 2019-11-23 DIAGNOSIS — E7801 Familial hypercholesterolemia: Secondary | ICD-10-CM

## 2019-11-23 DIAGNOSIS — E785 Hyperlipidemia, unspecified: Secondary | ICD-10-CM

## 2019-11-23 MED ORDER — NEXLIZET 180-10 MG PO TABS: 1.0000 | ORAL_TABLET | Freq: Every day | ORAL | 1 refills | Status: DC

## 2019-11-27 ENCOUNTER — Encounter: Payer: Self-pay | Admitting: Internal Medicine

## 2019-12-10 ENCOUNTER — Telehealth: Payer: Self-pay

## 2019-12-23 NOTE — Telephone Encounter (Signed)
New message  Checking on the status of prior authorization  Bempedoic Acid-Ezetimibe (NEXLIZET) 180-10 MG TABS

## 2019-12-24 NOTE — Telephone Encounter (Signed)
Previous not already started about PA

## 2019-12-25 NOTE — Telephone Encounter (Signed)
PA has been approved.    Previously tried and failed therapies: simvastatin, lovastatin, rosuvastatin and atorvastatin.

## 2019-12-31 ENCOUNTER — Encounter: Payer: Self-pay | Admitting: Internal Medicine

## 2020-01-01 ENCOUNTER — Other Ambulatory Visit: Payer: Self-pay | Admitting: Internal Medicine

## 2020-01-01 DIAGNOSIS — E785 Hyperlipidemia, unspecified: Secondary | ICD-10-CM

## 2020-01-01 MED ORDER — EZETIMIBE 10 MG PO TABS: 10.0000 mg | ORAL_TABLET | Freq: Every day | ORAL | 1 refills | Status: DC

## 2020-01-28 ENCOUNTER — Other Ambulatory Visit: Payer: Self-pay | Admitting: Internal Medicine

## 2020-01-28 DIAGNOSIS — I10 Essential (primary) hypertension: Secondary | ICD-10-CM

## 2020-01-28 DIAGNOSIS — G5762 Lesion of plantar nerve, left lower limb: Secondary | ICD-10-CM

## 2020-02-03 DIAGNOSIS — H524 Presbyopia: Secondary | ICD-10-CM | POA: Diagnosis not present

## 2020-02-03 DIAGNOSIS — H35033 Hypertensive retinopathy, bilateral: Secondary | ICD-10-CM | POA: Diagnosis not present

## 2020-02-03 DIAGNOSIS — H5212 Myopia, left eye: Secondary | ICD-10-CM | POA: Diagnosis not present

## 2020-02-03 DIAGNOSIS — H2513 Age-related nuclear cataract, bilateral: Secondary | ICD-10-CM | POA: Diagnosis not present

## 2020-02-03 DIAGNOSIS — H52223 Regular astigmatism, bilateral: Secondary | ICD-10-CM | POA: Diagnosis not present

## 2020-03-08 ENCOUNTER — Other Ambulatory Visit: Payer: Self-pay | Admitting: Internal Medicine

## 2020-03-08 DIAGNOSIS — J301 Allergic rhinitis due to pollen: Secondary | ICD-10-CM

## 2020-03-12 ENCOUNTER — Ambulatory Visit: Payer: Medicare Other | Attending: Internal Medicine

## 2020-03-12 DIAGNOSIS — Z23 Encounter for immunization: Secondary | ICD-10-CM

## 2020-03-12 NOTE — Progress Notes (Signed)
° °  Covid-19 Vaccination Clinic  Name:  Carrieanne Kleen    MRN: [id number] DOB: 07-15-1955  03/12/2020  Ms. Hopkins-Keirstyn Aydt was observed post Covid-19 immunization for 15 minutes without incident. She was provided with Vaccine Information Sheet and instruction to access the V-Safe system.   [name] was instructed to call 911 with any severe reactions post vaccine:  Difficulty breathing   Swelling of face and throat   A fast heartbeat   A bad rash all over body   Dizziness and weakness

## 2020-03-14 ENCOUNTER — Other Ambulatory Visit: Payer: Self-pay

## 2020-03-14 ENCOUNTER — Telehealth: Payer: Self-pay | Admitting: Internal Medicine

## 2020-03-14 ENCOUNTER — Ambulatory Visit (INDEPENDENT_AMBULATORY_CARE_PROVIDER_SITE_OTHER): Payer: Medicare Other

## 2020-03-14 ENCOUNTER — Encounter: Payer: Self-pay | Admitting: Internal Medicine

## 2020-03-14 ENCOUNTER — Ambulatory Visit (INDEPENDENT_AMBULATORY_CARE_PROVIDER_SITE_OTHER): Payer: Medicare Other | Admitting: Internal Medicine

## 2020-03-14 VITALS — BP 134/86 | HR 82 | Temp 98.2°F | Resp 16 | Ht 63.5 in | Wt 211.0 lb

## 2020-03-14 DIAGNOSIS — E559 Vitamin D deficiency, unspecified: Secondary | ICD-10-CM | POA: Diagnosis not present

## 2020-03-14 DIAGNOSIS — R059 Cough, unspecified: Secondary | ICD-10-CM

## 2020-03-14 DIAGNOSIS — E785 Hyperlipidemia, unspecified: Secondary | ICD-10-CM | POA: Diagnosis not present

## 2020-03-14 DIAGNOSIS — L2084 Intrinsic (allergic) eczema: Secondary | ICD-10-CM

## 2020-03-14 DIAGNOSIS — I10 Essential (primary) hypertension: Secondary | ICD-10-CM | POA: Diagnosis not present

## 2020-03-14 DIAGNOSIS — Z23 Encounter for immunization: Secondary | ICD-10-CM | POA: Insufficient documentation

## 2020-03-14 DIAGNOSIS — M85851 Other specified disorders of bone density and structure, right thigh: Secondary | ICD-10-CM

## 2020-03-14 DIAGNOSIS — J4530 Mild persistent asthma, uncomplicated: Secondary | ICD-10-CM | POA: Insufficient documentation

## 2020-03-14 DIAGNOSIS — J189 Pneumonia, unspecified organism: Secondary | ICD-10-CM

## 2020-03-14 LAB — BASIC METABOLIC PANEL
BUN: 11 mg/dL (ref 6–23)
CO2: 28 mEq/L (ref 19–32)
Calcium: 10.2 mg/dL (ref 8.4–10.5)
Chloride: 106 mEq/L (ref 96–112)
Creatinine, Ser: 0.97 mg/dL (ref 0.40–1.20)
GFR: 60.83 mL/min (ref >60.00)
Glucose, Bld: 88 mg/dL (ref 70–99)
Potassium: 4.1 mEq/L (ref 3.5–5.1)
Sodium: 141 mEq/L (ref 135–145)

## 2020-03-14 LAB — LIPID PANEL
Cholesterol: 246 mg/dL — ABNORMAL HIGH (ref 0–200)
HDL: 56.6 mg/dL (ref >39.00)
LDL Cholesterol: 166 mg/dL — ABNORMAL HIGH (ref 0–99)
NonHDL: 189.5
Total CHOL/HDL Ratio: 4
Triglycerides: 119 mg/dL (ref 0.0–149.0)
VLDL: 23.8 mg/dL (ref 0.0–40.0)

## 2020-03-14 LAB — CK: Total CK: 64 U/L (ref 7–177)

## 2020-03-14 LAB — VITAMIN D 25 HYDROXY (VIT D DEFICIENCY, FRACTURES): VITD: 26.71 ng/mL — ABNORMAL LOW (ref 30.00–100.00)

## 2020-03-14 MED ORDER — FLOVENT HFA 110 MCG/ACT IN AERO: 1.0000 | INHALATION_SPRAY | Freq: Two times a day (BID) | RESPIRATORY_TRACT | 1 refills | Status: DC

## 2020-03-14 MED ORDER — FLUOCINONIDE EMULSIFIED BASE 0.05 % EX CREA: 1.0000 "application " | TOPICAL_CREAM | Freq: Two times a day (BID) | CUTANEOUS | 3 refills | Status: DC

## 2020-03-14 MED ORDER — MOXIFLOXACIN HCL 400 MG PO TABS: 400.0000 mg | ORAL_TABLET | Freq: Every day | ORAL | 0 refills | Status: AC

## 2020-03-14 NOTE — Progress Notes (Signed)
Subjective:  Patient ID: Kayla Lowe, female    DOB: [birth year]  Age: 64 y.o. MRN: [id number]  CC: Cough, Rash, and Hyperlipidemia  This visit occurred during the SARS-CoV-2 public health emergency.  Safety protocols were in place, including screening questions prior to the visit, additional usage of staff PPE, and extensive cleaning of exam room while observing appropriate contact time as indicated for disinfecting solutions.    HPI Kayla Lowe presents for f/up -   1.  She complains of a several week history of cough productive of thick white phlegm with wheezing and shortness of breath.  The cough worsens at night.  She denies fever, chills, night sweats, chest pain, or hemoptysis.  2.  She complains of a chronic itchy rash on the right side of her neck and on her extremities.  She is not currently treating it.  3.  She tells me she is taking the statin and has very rare muscle cramps that occur at rest.  She has no muscle cramps or weakness during the day and denies claudication.  Outpatient Medications Prior to Visit  Medication Sig Dispense Refill  . amLODipine (NORVASC) 10 MG tablet TAKE 1 TABLET(10 MG) BY MOUTH EVERY EVENING 90 tablet 0  . aspirin EC 81 MG tablet Take 1 tablet (81 mg total) by mouth daily. 90 tablet 3  . calcium-vitamin D (OSCAL WITH D) 500-200 MG-UNIT tablet Take 1 tablet by mouth 2 (two) times daily.    . carvedilol (COREG) 6.25 MG tablet TAKE 1 TABLET(6.25 MG) BY MOUTH TWICE DAILY WITH A MEAL 180 tablet 0  . cetirizine (ZYRTEC) 10 MG tablet Take 1 tablet (10 mg total) by mouth daily. 30 tablet 6  . co-enzyme Q-10 50 MG capsule Take 100 mg by mouth daily after lunch.    . esomeprazole (NEXIUM) 20 MG capsule Take 1 capsule (20 mg total) by mouth daily as needed. 30 capsule 4  . ezetimibe (ZETIA) 10 MG tablet Take 1 tablet (10 mg total) by mouth daily. 90 tablet 1  . fluticasone (FLONASE) 50 MCG/ACT nasal spray PLACE 2 SPRAYS INTO  BOTH NOSTRILS DAILY. 16 g 2  . montelukast (SINGULAIR) 10 MG tablet TAKE 1 TABLET(10 MG) BY MOUTH AT BEDTIME 90 tablet 1  . nabumetone (RELAFEN) 500 MG tablet TAKE 1 TABLET(500 MG) BY MOUTH TWICE DAILY AS NEEDED 180 tablet 1  . Omega-3 Fatty Acids (FISH OIL) 1000 MG CAPS Take 1,000 mg by mouth every evening.    . pravastatin (PRAVACHOL) 20 MG tablet Take 1 tablet (20 mg total) by mouth daily. 90 tablet 1  . Cholecalciferol 1.25 MG (50000 UT) capsule Take 1 capsule (50,000 Units total) by mouth once a week. 12 capsule 0  . cyclobenzaprine (FLEXERIL) 10 MG tablet Take 1 tablet (10 mg total) by mouth at bedtime. 30 tablet 1   No facility-administered medications prior to visit.    ROS Review of Systems  Constitutional: Negative for appetite change, chills, diaphoresis, fatigue, fever and unexpected weight change.  HENT: Negative.  Negative for sore throat and trouble swallowing.   Respiratory: Positive for cough, shortness of breath and wheezing. Negative for chest tightness.   Cardiovascular: Negative for chest pain, palpitations and leg swelling.  Gastrointestinal: Negative for abdominal pain, constipation, diarrhea, nausea and vomiting.  Genitourinary: Negative.  Negative for difficulty urinating.  Musculoskeletal: Positive for myalgias. Negative for arthralgias, back pain and neck pain.  Skin: Positive for rash. Negative for color change.  Neurological: Negative.  Negative for dizziness,  weakness, light-headedness and numbness.  Hematological: Negative for adenopathy. Does not bruise/bleed easily.  Psychiatric/Behavioral: Negative.     Objective:  BP 134/86   Pulse 82   Temp 98.2 F (36.8 C) (Oral)   Resp 16   Ht 5' 3.5" (1.613 m)   Wt 211 lb (95.7 kg)   SpO2 98%   BMI 36.79 kg/m   BP Readings from Last 3 Encounters:  03/14/20 134/86  09/16/19 136/84  08/03/19 119/78    Wt Readings from Last 3 Encounters:  03/14/20 211 lb (95.7 kg)  09/16/19 215 lb (97.5 kg)  07/20/19  216 lb 4 oz (98.1 kg)    Physical Exam Vitals reviewed.  Constitutional:      Appearance: Normal appearance.  HENT:     Nose: Nose normal.     Mouth/Throat:     Mouth: Mucous membranes are moist.  Eyes:     General: No scleral icterus.    Conjunctiva/sclera: Conjunctivae normal.  Cardiovascular:     Rate and Rhythm: Normal rate and regular rhythm.     Heart sounds: No murmur heard.   Pulmonary:     Effort: Pulmonary effort is normal.     Breath sounds: No stridor. No wheezing, rhonchi or rales.  Abdominal:     General: Abdomen is flat.     Palpations: There is no mass.     Tenderness: There is no abdominal tenderness.  Musculoskeletal:        General: Normal range of motion.     Cervical back: Neck supple.     Right lower leg: No edema.     Left lower leg: No edema.  Lymphadenopathy:     Cervical: No cervical adenopathy.  Skin:    General: Skin is warm and dry.     Findings: Rash present. No abscess. Rash is macular and papular. Rash is not nodular, purpuric, pustular, urticarial or vesicular.     Comments: There are patches of hyperpigmentation, xerosis, coalesced papules with scale.  There is one noted on the right side of the neck and several on her extremities.  Neurological:     General: No focal deficit present.     Mental Status: She is alert.  Psychiatric:        Mood and Affect: Mood normal.        Behavior: Behavior normal.     Lab Results  Component Value Date   WBC 6.6 09/16/2019   HGB 12.7 09/16/2019   HCT 37.8 09/16/2019   PLT 326.0 09/16/2019   GLUCOSE 88 03/14/2020   CHOL 246 (H) 03/14/2020   TRIG 119.0 03/14/2020   HDL 56.60 03/14/2020   LDLDIRECT 220.0 09/16/2019   LDLCALC 166 (H) 03/14/2020   ALT 11 09/16/2019   AST 12 09/16/2019   NA 141 03/14/2020   K 4.1 03/14/2020   CL 106 03/14/2020   CREATININE 0.97 03/14/2020   BUN 11 03/14/2020   CO2 28 03/14/2020   TSH 1.80 09/16/2019   INR 0.97 07/10/2013   HGBA1C 5.4 08/31/2019    DG  Bone Density  Result Date: 01/01/2019 EXAM: DUAL X-RAY ABSORPTIOMETRY (DXA) FOR BONE MINERAL DENSITY IMPRESSION: Referring Physician:  Marcine MatarEBORAH B JOHNSON Your patient completed a BMD test using Lunar IDXA DXA system ( analysis version: 16 ) manufactured by [employer]. Technologist: CG PATIENT: Name: Thana AtesHopkins-Washington, Landis, Jera Patient ID:  [id number] Birth Date: 1955/10/30 Height:     64.0 in. Sex:         Female Measured:  01/01/2019 Weight:     214.4 lbs. 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 This patient is considered OSTEOPENIC according to World Health Organization [facility]) criteria. The scan quality is good. Lumbar spine was not utilized due to advanced degenerative. Site Region Measured Date Measured Age YA T-score BMD Significant CHANGE DualFemur Neck Right 01/01/2019 65.4 -1.3 0.854 g/cm2 DualFemur Total Mean 01/01/2019 65.4 -0.7 0.914 g/cm2 Right Forearm Radius 33% 01/01/2019 65.4 -0.7 0.828 g/cm2 World Health Organization [facility]) criteria for post-menopausal, Caucasian Women: Normal       T-score at or above -1 SD Osteopenia   T-score between -1 and -2.5 SD Osteoporosis T-score at or below -2.5 SD RECOMMENDATION: 1. All patients should optimize calcium and vitamin D intake. 2. Consider FDA approved medical therapies in postmenopausal women and men aged 79 years and older, based on the following: a. A hip or vertebral (clinical or morphometric) fracture b. T- score < or = -2.5 at the femoral neck or spine after appropriate evaluation to exclude secondary causes c. Low bone mass (T-score between -1.0 and -2.5 at the femoral neck or spine) and a 10 year probability of a hip fracture > or = 3% or a 10 year probability of a major osteoporosis-related fracture > or = 20% based on  the [place]-adapted WHO algorithm d. Clinician judgment and/or patient preferences may indicate treatment for people with 10-year fracture probabilities above or below these levels FOLLOW-UP: Patients with diagnosis of osteoporosis or at high risk for fracture should have regular bone mineral density tests. For patients eligible for Medicare, routine testing is allowed once every 2 years. The testing frequency can be increased to one year for patients who have rapidly progressing disease, those who are receiving or discontinuing medical therapy to restore bone mass, or have additional risk factors. [facility] FRAX* 10-year Probability of Fracture Based on femoral neck BMD: DualFemur (Right) Major Osteoporotic Fracture: 4.5% Hip Fracture:                0.4% Population:                  [place] ([place]) Risk Factors:                Rheumatoid Arthritis (714.0) *FRAX is a Armed forces logistics/support/administrative officer of the [employer] of [employer] for Metabolic Bone Disease, a World Science writer (WHO) [employer]. ASSESSMENT: The probability of a major osteoporotic fracture is 4.5% within the next ten years. The probability of a hip fracture is 0.4% within the next ten years. I have reviewed this report and agree with the above findings. Mark A. Tyron Russell, M.D. [facility] Electronically Signed   By: Ulyses Southward M.D.   On: 01/01/2019 12:40   MM 3D SCREEN BREAST BILATERAL  Result Date: 01/01/2019 CLINICAL DATA:  Screening. EXAM: DIGITAL SCREENING BILATERAL MAMMOGRAM WITH TOMO AND CAD COMPARISON:  Previous exam(s). ACR Breast Density Category b: There are scattered areas of fibroglandular density. FINDINGS: There are no findings suspicious for malignancy. Images were processed with CAD. IMPRESSION: No mammographic evidence of malignancy. A result letter of this screening mammogram will be mailed directly to the patient. RECOMMENDATION: Screening mammogram in one year. (Code:SM-B-01Y) BI-RADS CATEGORY  1:  Negative. Electronically Signed   By: Sherian Rein M.D.   On: 01/01/2019 18:10    DG Chest 2  View  Result Date: 03/14/2020 CLINICAL DATA:  Cough. EXAM: CHEST - 2 VIEW COMPARISON:  Chest radiograph 08/30/2013 FINDINGS: Normal cardiac and mediastinal contours. Mild patchy airspace opacities right mid lower lung. No pleural effusion or pneumothorax. Thoracic spine degenerative changes. IMPRESSION: Mild patchy airspace opacities right mid and lower lung may represent infection in the appropriate clinical setting. Followup PA and lateral chest X-ray is recommended in 3-4 weeks following trial of antibiotic therapy to ensure resolution and exclude underlying malignancy. Electronically Signed   By: Annia Belt M.D.   On: 03/14/2020 11:09    Assessment & Plan:   Jihan was seen today for cough, rash and hyperlipidemia.  Diagnoses and all orders for this visit:  Flu vaccine need -     Flu Vaccine QUAD High Dose(Fluad)  Intrinsic eczema -     fluocinonide-emollient (LIDEX-E) 0.05 % cream; Apply 1 application topically 2 (two) times daily.  Mild persistent asthma without complication- I have asked her to start using an ICS. -     fluticasone (FLOVENT HFA) 110 MCG/ACT inhaler; Inhale 1 puff into the lungs in the morning and at bedtime.  Essential hypertension, benign- Her BP is well controlled. -     Basic metabolic panel; Future -     Basic metabolic panel  Dyslipidemia, goal LDL below 130- Improvement noted. Her CK is normal. She is willing to continue taking the statin. -     Lipid panel; Future -     Cancel: CK; Future -     CK; Future -     CK -     Lipid panel  Vitamin D deficiency disease- Her Vit D level remains low. Will restart the Vit D supplement. -     VITAMIN D 25 Hydroxy (Vit-D Deficiency, Fractures); Future -     VITAMIN D 25 Hydroxy (Vit-D Deficiency, Fractures)  Cough- Her CXR is positive for B air space opacities. -     DG Chest 2 View; Future  Pneumonia of both  lungs due to infectious organism, unspecified part of lung- Will treat for typical and atypical causes. -     moxifloxacin (AVELOX) 400 MG tablet; Take 1 tablet (400 mg total) by mouth daily for 7 days.   I have discontinued Jemeka Hopkins-Washington's cyclobenzaprine. I am also having her start on Flovent HFA, fluocinonide-emollient, and moxifloxacin. Additionally, I am having her maintain her co-enzyme Q-10, Fish Oil, aspirin EC, cetirizine, fluticasone, calcium-vitamin D, esomeprazole, pravastatin, ezetimibe, amLODipine, carvedilol, nabumetone, montelukast, and Cholecalciferol.  Meds ordered this encounter  Medications  . fluticasone (FLOVENT HFA) 110 MCG/ACT inhaler    Sig: Inhale 1 puff into the lungs in the morning and at bedtime.    Dispense:  3 each    Refill:  1  . fluocinonide-emollient (LIDEX-E) 0.05 % cream    Sig: Apply 1 application topically 2 (two) times daily.    Dispense:  60 g    Refill:  3  . moxifloxacin (AVELOX) 400 MG tablet    Sig: Take 1 tablet (400 mg total) by mouth daily for 7 days.    Dispense:  7 tablet    Refill:  0  . Cholecalciferol 1.25 MG (50000 UT) capsule    Sig: Take 1 capsule (50,000 Units total) by mouth once a week.    Dispense:  12 capsule    Refill:  0   I spent 50 minutes in preparing to see the patient by review of recent labs, imaging and procedures, obtaining and reviewing  separately obtained history, communicating with the patient and family or caregiver, ordering medications, tests or procedures, and documenting clinical information in the EHR including the differential Dx, treatment, and any further evaluation and other management of  1. Intrinsic eczema 2. Mild persistent asthma without complication 3. Essential hypertension, benign 4. Dyslipidemia, goal LDL below 130 5. Vitamin D deficiency disease 6. Cough 7. Pneumonia of both lungs due to infectious organism, unspecified part of lung 8. Osteopenia of neck of right femur      Follow-up: Return in about 4 months (around 07/12/2020).  Sanda Linger, MD

## 2020-03-14 NOTE — Patient Instructions (Signed)

## 2020-03-14 NOTE — Telephone Encounter (Signed)
Has no refills on this  amLODipine (NORVASC) 10 MG tablet carvedilol (COREG) 6.25 MG tablet montelukast (SINGULAIR) 10 MG tablet pravastatin (PRAVACHOL) 20 MG tablet   Has one refill cyclobenzaprine (FLEXERIL) 10 MG tablet ezetimibe (ZETIA) 10 MG tablet nabumetone (RELAFEN) 500 MG tablet  Cannot afford these medications fluocinonide-emollient (LIDEX-E) 0.05 % cream fluticasone (FLOVENT HFA) 110 MCG/ACT inhaler   Patient was seen this morning and forgot her medications at home and was told to call to inform us about all the medications she needs/ has refills on as well the ones that were sent in today she cannot afford and wants to know if someone else can be sent in.

## 2020-03-15 ENCOUNTER — Other Ambulatory Visit: Payer: Self-pay | Admitting: Internal Medicine

## 2020-03-15 DIAGNOSIS — E785 Hyperlipidemia, unspecified: Secondary | ICD-10-CM

## 2020-03-15 DIAGNOSIS — I10 Essential (primary) hypertension: Secondary | ICD-10-CM

## 2020-03-15 MED ORDER — CHOLECALCIFEROL 1.25 MG (50000 UT) PO CAPS: 50000.0000 [IU] | ORAL_CAPSULE | ORAL | 0 refills | Status: DC

## 2020-03-15 MED ORDER — CARVEDILOL 6.25 MG PO TABS: ORAL_TABLET | ORAL | 0 refills | Status: DC

## 2020-03-15 MED ORDER — AMLODIPINE BESYLATE 10 MG PO TABS: 10.0000 mg | ORAL_TABLET | Freq: Every day | ORAL | 0 refills | Status: DC

## 2020-03-15 MED ORDER — EZETIMIBE 10 MG PO TABS: 10.0000 mg | ORAL_TABLET | Freq: Every day | ORAL | 1 refills | Status: DC

## 2020-03-16 ENCOUNTER — Telehealth: Payer: Self-pay | Admitting: Internal Medicine

## 2020-03-16 NOTE — Telephone Encounter (Signed)
fluocinonide-emollient (LIDEX-E) 0.05 % cream  fluticasone (FLOVENT HFA) 110 MCG/ACT inhaler  Patient states these medicines are too expensive, could find a less expensive medication?   [id number] [place], [place] - [address] AT [facility] OF [address] Phone:  [phone]  Fax:  [phone]

## 2020-03-16 NOTE — Telephone Encounter (Signed)
Called pt, LVM informing her to check with insurance company to see what similar medications that they cover that would be at a good price point for her then give [place] a call to let us know.

## 2020-03-29 ENCOUNTER — Telehealth: Payer: Self-pay | Admitting: Internal Medicine

## 2020-03-29 NOTE — Telephone Encounter (Signed)
LVM for pt to rtn my call to schedule AWV-I with [facility]. Please schedule appt if pt calls the office.    Thanks, [phone]

## 2020-03-31 ENCOUNTER — Ambulatory Visit (INDEPENDENT_AMBULATORY_CARE_PROVIDER_SITE_OTHER): Payer: Medicare Other | Admitting: Internal Medicine

## 2020-03-31 ENCOUNTER — Encounter: Payer: Self-pay | Admitting: Internal Medicine

## 2020-03-31 ENCOUNTER — Ambulatory Visit (INDEPENDENT_AMBULATORY_CARE_PROVIDER_SITE_OTHER): Payer: Medicare Other

## 2020-03-31 ENCOUNTER — Other Ambulatory Visit: Payer: Self-pay

## 2020-03-31 VITALS — BP 136/78 | HR 68 | Temp 98.4°F | Ht 63.5 in | Wt 213.0 lb

## 2020-03-31 DIAGNOSIS — I10 Essential (primary) hypertension: Secondary | ICD-10-CM

## 2020-03-31 DIAGNOSIS — J189 Pneumonia, unspecified organism: Secondary | ICD-10-CM | POA: Diagnosis not present

## 2020-03-31 NOTE — Patient Instructions (Signed)
Community-Acquired Pneumonia, Adult Pneumonia is an infection of the lungs. It causes swelling in the airways of the lungs. Mucus and fluid may also build up inside the airways. One type of pneumonia can happen while a person is in a hospital. A different type can happen when a person is not in a hospital (community-acquired pneumonia).  What are the causes?  This condition is caused by germs (viruses, bacteria, or fungi). Some types of germs can be passed from one person to another. This can happen when you breathe in droplets from the cough or sneeze of an infected person. What increases the risk? You are more likely to develop this condition if you:  Have a long-term (chronic) disease, such as: ? Chronic obstructive pulmonary disease (COPD). ? Asthma. ? Cystic fibrosis. ? Congestive heart failure. ? Diabetes. ? Kidney disease.  Have HIV.  Have sickle cell disease.  Have had your spleen removed.  Do not take good care of your teeth and mouth (poor dental hygiene).  Have a medical condition that increases the risk of breathing in droplets from your own mouth and nose.  Have a weakened body defense system (immune system).  Are a smoker.  Travel to areas where the germs that cause this illness are common.  Are around certain animals or the places they live. What are the signs or symptoms?  A dry cough.  A wet (productive) cough.  Fever.  Sweating.  Chest pain. This often happens when breathing deeply or coughing.  Fast breathing or trouble breathing.  Shortness of breath.  Shaking chills.  Feeling tired (fatigue).  Muscle aches. How is this treated? Treatment for this condition depends on many things. Most adults can be treated at home. In some cases, treatment must happen in a hospital. Treatment may include:  Medicines given by mouth or through an IV tube.  Being given extra oxygen.  Respiratory therapy. In rare cases, treatment for very bad pneumonia  may include:  Using a machine to help you breathe.  Having a procedure to remove fluid from around your lungs. Follow these instructions at home: Medicines  Take over-the-counter and prescription medicines only as told by your doctor. ? Only take cough medicine if you are losing sleep.  If you were prescribed an antibiotic medicine, take it as told by your doctor. Do not stop taking the antibiotic even if you start to feel better. General instructions   Sleep with your head and neck raised (elevated). You can do this by sleeping in a recliner or by putting a few pillows under your head.  Rest as needed. Get at least 8 hours of sleep each night.  Drink enough water to keep your pee (urine) pale yellow.  Eat a healthy diet that includes plenty of vegetables, fruits, whole grains, low-fat dairy products, and lean protein.  Do not use any products that contain nicotine or tobacco. These include cigarettes, e-cigarettes, and chewing tobacco. If you need help quitting, ask your doctor.  Keep all follow-up visits as told by your doctor. This is important. How is this prevented? A shot (vaccine) can help prevent pneumonia. Shots are often suggested for:  People older than 65 years of age.  People older than 66 years of age who: ? Are having cancer treatment. ? Have long-term (chronic) lung disease. ? Have problems with their body's defense system. You may also prevent pneumonia if you take these actions:  Get the flu (influenza) shot every year.  Go to the dentist as   often as told.  Wash your hands often. If you cannot use soap and water, use hand sanitizer. Contact a doctor if:  You have a fever.  You lose sleep because your cough medicine does not help. Get help right away if:  You are short of breath and it gets worse.  You have more chest pain.  Your sickness gets worse. This is very serious if: ? You are an older adult. ? Your body's defense system is weak.  You  cough up blood. Summary  Pneumonia is an infection of the lungs.  Most adults can be treated at home. Some will need treatment in a hospital.  Drink enough water to keep your pee pale yellow.  Get at least 8 hours of sleep each night. This information is not intended to replace advice given to you by your health care provider. Make sure you discuss any questions you have with your health care provider. Document Revised: 08/20/2018 Document Reviewed: 12/26/2017 Elsevier Patient Education  2020 [employer]  

## 2020-03-31 NOTE — Progress Notes (Signed)
Subjective:  Patient ID: Kayla Lowe, female    DOB: 04-Jan-1956  Age: 64 y.o. MRN: [id number]  CC: Hypertension and Hyperlipidemia  This visit occurred during the SARS-CoV-2 public health emergency.  Safety protocols were in place, including screening questions prior to the visit, additional usage of staff PPE, and extensive cleaning of exam room while observing appropriate contact time as indicated for disinfecting solutions.    HPI Kayla Lowe presents for f/up - She was treated for pneumonia about 2 to 3 weeks ago.  She tells me that all of her symptoms have resolved.  She denies cough, fever, chills, wheezing, hemoptysis, or night sweats.  Outpatient Medications Prior to Visit  Medication Sig Dispense Refill   amLODipine (NORVASC) 10 MG tablet Take 1 tablet (10 mg total) by mouth daily. 90 tablet 0   carvedilol (COREG) 6.25 MG tablet TAKE 1 TABLET(6.25 MG) BY MOUTH TWICE DAILY WITH A MEAL 180 tablet 0   Cholecalciferol 1.25 MG (50000 UT) capsule Take 1 capsule (50,000 Units total) by mouth once a week. 12 capsule 0   co-enzyme Q-10 50 MG capsule Take 100 mg by mouth daily after lunch.     esomeprazole (NEXIUM) 20 MG capsule Take 1 capsule (20 mg total) by mouth daily as needed. 30 capsule 4   ezetimibe (ZETIA) 10 MG tablet Take 1 tablet (10 mg total) by mouth daily. 90 tablet 1   fluticasone (FLONASE) 50 MCG/ACT nasal spray PLACE 2 SPRAYS INTO BOTH NOSTRILS DAILY. 16 g 2   montelukast (SINGULAIR) 10 MG tablet TAKE 1 TABLET(10 MG) BY MOUTH AT BEDTIME 90 tablet 1   nabumetone (RELAFEN) 500 MG tablet TAKE 1 TABLET(500 MG) BY MOUTH TWICE DAILY AS NEEDED 180 tablet 1   Omega-3 Fatty Acids (FISH OIL) 1000 MG CAPS Take 1,000 mg by mouth every evening.     pravastatin (PRAVACHOL) 20 MG tablet Take 1 tablet (20 mg total) by mouth daily. 90 tablet 1   calcium-vitamin D (OSCAL WITH D) 500-200 MG-UNIT tablet Take 1 tablet by mouth 2 (two) times daily.       fluocinonide-emollient (LIDEX-E) 0.05 % cream Apply 1 application topically 2 (two) times daily. (Patient not taking: Reported on 03/31/2020) 60 g 3   fluticasone (FLOVENT HFA) 110 MCG/ACT inhaler Inhale 1 puff into the lungs in the morning and at bedtime. (Patient not taking: Reported on 03/31/2020) 3 each 1   No facility-administered medications prior to visit.    ROS Review of Systems  Constitutional: Negative for chills, fatigue and fever.  HENT: Negative.   Eyes: Negative.   Respiratory: Negative for cough, chest tightness, shortness of breath and wheezing.   Cardiovascular: Negative for chest pain, palpitations and leg swelling.  Gastrointestinal: Negative for abdominal pain, constipation, diarrhea, nausea and vomiting.  Endocrine: Negative.   Genitourinary: Negative.  Negative for difficulty urinating.  Musculoskeletal: Negative.  Negative for arthralgias and myalgias.  Skin: Negative.  Negative for color change and pallor.  Neurological: Negative.  Negative for dizziness, weakness, light-headedness and numbness.  Hematological: Negative for adenopathy. Does not bruise/bleed easily.  Psychiatric/Behavioral: Negative.     Objective:  BP 136/78    Pulse 68    Temp 98.4 F (36.9 C) (Oral)    Ht 5' 3.5" (1.613 m)    Wt 213 lb (96.6 kg)    SpO2 98%    BMI 37.14 kg/m   BP Readings from Last 3 Encounters:  03/31/20 136/78  03/14/20 134/86  09/16/19 136/84    Wt  Readings from Last 3 Encounters:  03/31/20 213 lb (96.6 kg)  03/14/20 211 lb (95.7 kg)  09/16/19 215 lb (97.5 kg)    Physical Exam Vitals reviewed.  HENT:     Nose: Nose normal.     Mouth/Throat:     Mouth: Mucous membranes are moist.  Eyes:     General: No scleral icterus.    Conjunctiva/sclera: Conjunctivae normal.  Cardiovascular:     Rate and Rhythm: Normal rate and regular rhythm.     Heart sounds: No murmur heard.   Pulmonary:     Effort: Pulmonary effort is normal.     Breath sounds: No  stridor. No wheezing, rhonchi or rales.  Abdominal:     General: Abdomen is protuberant. Bowel sounds are normal. There is no distension.     Palpations: Abdomen is soft. There is no hepatomegaly, splenomegaly or mass.     Tenderness: There is no abdominal tenderness.  Musculoskeletal:        General: Normal range of motion.     Cervical back: Neck supple.     Right lower leg: No edema.     Left lower leg: No edema.  Lymphadenopathy:     Cervical: No cervical adenopathy.  Skin:    General: Skin is warm and dry.     Coloration: Skin is not pale.  Neurological:     General: No focal deficit present.     Mental Status: She is alert.  Psychiatric:        Mood and Affect: Mood normal.        Behavior: Behavior normal.     Lab Results  Component Value Date   WBC 6.6 09/16/2019   HGB 12.7 09/16/2019   HCT 37.8 09/16/2019   PLT 326.0 09/16/2019   GLUCOSE 88 03/14/2020   CHOL 246 (H) 03/14/2020   TRIG 119.0 03/14/2020   HDL 56.60 03/14/2020   LDLDIRECT 220.0 09/16/2019   LDLCALC 166 (H) 03/14/2020   ALT 11 09/16/2019   AST 12 09/16/2019   NA 141 03/14/2020   K 4.1 03/14/2020   CL 106 03/14/2020   CREATININE 0.97 03/14/2020   BUN 11 03/14/2020   CO2 28 03/14/2020   TSH 1.80 09/16/2019   INR 0.97 07/10/2013   HGBA1C 5.4 08/31/2019    DG Bone Density  Result Date: 01/01/2019 EXAM: DUAL X-RAY ABSORPTIOMETRY (DXA) FOR BONE MINERAL DENSITY IMPRESSION: Referring Physician:  Marcine Lowe Your patient completed a BMD test using Lunar IDXA DXA system ( analysis version: 16 ) manufactured by [employer]. Technologist: CG PATIENT: Name: Kayla, Lowe Patient ID:  [id number] Birth Date: [birth year] Height:     64.0 in. Sex:         Female Measured:   01/01/2019 Weight:     214.4 lbs. 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 This patient is considered OSTEOPENIC according to World Health Organization [facility]) criteria. The scan quality is good. Lumbar spine was not utilized due to advanced degenerative. Site Region Measured Date Measured Age YA T-score BMD Significant CHANGE DualFemur Neck Right 01/01/2019 65.4 -1.3 0.854 g/cm2 DualFemur Total Mean 01/01/2019 65.4 -0.7 0.914 g/cm2 Right Forearm Radius 33% 01/01/2019 65.4 -0.7 0.828 g/cm2 World Health Organization [facility]) criteria for post-menopausal, Caucasian Women: Normal       T-score at or above -1 SD Osteopenia  T-score between -1 and -2.5 SD Osteoporosis T-score at or below -2.5 SD RECOMMENDATION: 1. All patients should optimize calcium and vitamin D intake. 2. Consider FDA approved medical therapies in postmenopausal women and men aged 72 years and older, based on the following: a. A hip or vertebral (clinical or morphometric) fracture b. T- score < or = -2.5 at the femoral neck or spine after appropriate evaluation to exclude secondary causes c. Low bone mass (T-score between -1.0 and -2.5 at the femoral neck or spine) and a 10 year probability of a hip fracture > or = 3% or a 10 year probability of a major osteoporosis-related fracture > or = 20% based on the [place]-adapted WHO algorithm d. Clinician judgment and/or patient preferences may indicate treatment for people with 10-year fracture probabilities above or below these levels FOLLOW-UP: Patients with diagnosis of osteoporosis or at high risk for fracture should have regular bone mineral density tests. For patients eligible for Medicare, routine testing is allowed once every 2 years. The testing frequency can be increased to one year for patients who have rapidly progressing disease, those who are receiving or discontinuing medical therapy to restore bone mass, or have additional risk factors. [facility] FRAX* 10-year Probability of  Fracture Based on femoral neck BMD: DualFemur (Right) Major Osteoporotic Fracture: 4.5% Hip Fracture:                0.4% Population:                  [place] ([place]) Risk Factors:                Rheumatoid Arthritis (714.0) *FRAX is a Armed forces logistics/support/administrative officer of the [employer] of [employer] for Metabolic Bone Disease, a World Science writer (WHO) [employer]. ASSESSMENT: The probability of a major osteoporotic fracture is 4.5% within the next ten years. The probability of a hip fracture is 0.4% within the next ten years. I have reviewed this report and agree with the above findings. Mark A. Tyron Russell, M.D. [facility] Electronically Signed   By: Ulyses Southward M.D.   On: 01/01/2019 12:40   MM 3D SCREEN BREAST BILATERAL  Result Date: 01/01/2019 CLINICAL DATA:  Screening. EXAM: DIGITAL SCREENING BILATERAL MAMMOGRAM WITH TOMO AND CAD COMPARISON:  Previous exam(s). ACR Breast Density Category b: There are scattered areas of fibroglandular density. FINDINGS: There are no findings suspicious for malignancy. Images were processed with CAD. IMPRESSION: No mammographic evidence of malignancy. A result letter of this screening mammogram will be mailed directly to the patient. RECOMMENDATION: Screening mammogram in one year. (Code:[id number]-B-01Y) BI-RADS CATEGORY  1: Negative. Electronically Signed   By: Sherian Rein M.D.   On: 01/01/2019 18:10    Narrative & Impression  CLINICAL DATA:  Cough.  EXAM: CHEST - 2 VIEW  COMPARISON:  Chest radiograph 08/30/2013  FINDINGS: Normal cardiac and mediastinal contours. Mild patchy airspace opacities right mid lower lung. No pleural effusion or pneumothorax. Thoracic spine degenerative changes.  IMPRESSION: Mild patchy airspace opacities right mid and lower lung may represent infection in the appropriate clinical setting. Followup PA and lateral chest X-ray is recommended in 3-4 weeks following trial of antibiotic therapy to ensure resolution and  exclude underlying malignancy.   Electronically Signed   By: Annia Belt M.D.   On: 03/14/2020 11:09   DG Chest 2 View  Result Date: 03/31/2020 CLINICAL DATA:  64 year old female with history of pneumonia. Follow-up study. EXAM: CHEST - 2 VIEW COMPARISON:  Chest x-ray  03/14/2020. FINDINGS: Lung volumes are normal. No consolidative airspace disease. No pleural effusions. No pneumothorax. No pulmonary nodule or mass noted. Pulmonary vasculature and the cardiomediastinal silhouette are within normal limits. Atherosclerosis in the thoracic aorta. IMPRESSION: 1.  No radiographic evidence of acute cardiopulmonary disease. 2. Aortic atherosclerosis. Electronically Signed   By: Trudie Reedaniel  Entrikin M.D.   On: 03/31/2020 17:01    Assessment & Plan:   Elease Hashimotoatricia was seen today for hypertension and hyperlipidemia.  Diagnoses and all orders for this visit:  Pneumonia of both lungs due to infectious organism, unspecified part of lung- Based on her symptoms, exam, normal chest x-ray this has resolved. -     DG Chest 2 View; Future  Essential hypertension, benign- Her blood pressure is adequately well controlled.   I have discontinued Elease Hashimotoatricia [place]'s calcium-vitamin D. I am also having her maintain her co-enzyme Q-10, Fish Oil, fluticasone, esomeprazole, pravastatin, nabumetone, montelukast, Flovent HFA, fluocinonide-emollient, Cholecalciferol, amLODipine, carvedilol, and ezetimibe.  No orders of the defined types were placed in this encounter.    Follow-up: Return if symptoms worsen or fail to improve.  Sanda Lingerhomas Davi Rotan, MD

## 2020-04-06 NOTE — Telephone Encounter (Signed)
Error

## 2020-04-06 NOTE — Telephone Encounter (Signed)
error 

## 2020-04-17 ENCOUNTER — Other Ambulatory Visit: Payer: Self-pay | Admitting: Internal Medicine

## 2020-04-17 DIAGNOSIS — E785 Hyperlipidemia, unspecified: Secondary | ICD-10-CM

## 2020-04-18 ENCOUNTER — Ambulatory Visit (INDEPENDENT_AMBULATORY_CARE_PROVIDER_SITE_OTHER): Payer: Medicare Other

## 2020-04-18 ENCOUNTER — Other Ambulatory Visit: Payer: Self-pay

## 2020-04-18 VITALS — BP 130/80 | HR 81 | Temp 98.2°F | Ht 64.0 in | Wt 200.4 lb

## 2020-04-18 DIAGNOSIS — Z0000 Encounter for general adult medical examination without abnormal findings: Secondary | ICD-10-CM | POA: Diagnosis not present

## 2020-04-18 NOTE — Progress Notes (Signed)
Subjective:   Kayla Mallickatricia Lowe is a 64 y.o. female who presents for Medicare Annual (Subsequent) preventive examination.  Review of Systems    No ROS. Medicare Wellness Visit. Additional risk factors are reflected in social history. Cardiac Risk Factors include: advanced age (>7055men, 47>65 women);dyslipidemia;hypertension;obesity (BMI >30kg/m2) Sleep Patterns: No sleep issues, feels rested on waking and sleeps 8 hours nightly. Home Safety/Smoke Alarms: Feels safe in home; uses home alarm. Smoke alarms in place. Living environment: 1-story home; Lives with significant other; no needs for DME; good support system. Seat Belt Safety/Bike Helmet: Wears seat belt.    Objective:    Today's Vitals   04/18/20 0836  BP: 130/80  Pulse: 81  Temp: 98.2 F (36.8 C)  SpO2: 98%  Weight: 200 lb 6.4 oz (90.9 kg)  Height: 5\' 4"  (1.626 m)  PainSc: 0-No pain   Body mass index is 34.4 kg/m.  Advanced Directives 04/18/2020 01/18/2017 11/30/2016 08/31/2016 01/05/2014  Does Patient Have a Medical Advance Directive? No No No No No  Would patient like information on creating a medical advance directive? No - Patient declined No - Patient declined - - -    Current Medications (verified) Outpatient Encounter Medications as of 04/18/2020  Medication Sig  . amLODipine (NORVASC) 10 MG tablet Take 1 tablet (10 mg total) by mouth daily.  . carvedilol (COREG) 6.25 MG tablet TAKE 1 TABLET(6.25 MG) BY MOUTH TWICE DAILY WITH A MEAL  . Cholecalciferol 1.25 MG (50000 UT) capsule Take 1 capsule (50,000 Units total) by mouth once a week.  . co-enzyme Q-10 50 MG capsule Take 100 mg by mouth daily after lunch.  . esomeprazole (NEXIUM) 20 MG capsule Take 1 capsule (20 mg total) by mouth daily as needed.  . ezetimibe (ZETIA) 10 MG tablet Take 1 tablet (10 mg total) by mouth daily.  . fluocinonide-emollient (LIDEX-E) 0.05 % cream Apply 1 application topically 2 (two) times daily. (Patient not taking: Reported on  03/31/2020)  . fluticasone (FLONASE) 50 MCG/ACT nasal spray PLACE 2 SPRAYS INTO BOTH NOSTRILS DAILY.  . fluticasone (FLOVENT HFA) 110 MCG/ACT inhaler Inhale 1 puff into the lungs in the morning and at bedtime. (Patient not taking: Reported on 03/31/2020)  . montelukast (SINGULAIR) 10 MG tablet TAKE 1 TABLET(10 MG) BY MOUTH AT BEDTIME  . nabumetone (RELAFEN) 500 MG tablet TAKE 1 TABLET(500 MG) BY MOUTH TWICE DAILY AS NEEDED  . Omega-3 Fatty Acids (FISH OIL) 1000 MG CAPS Take 1,000 mg by mouth every evening.  . pravastatin (PRAVACHOL) 20 MG tablet TAKE 1 TABLET(20 MG) BY MOUTH DAILY  . [DISCONTINUED] pravastatin (PRAVACHOL) 20 MG tablet Take 1 tablet (20 mg total) by mouth daily.   No facility-administered encounter medications on file as of 04/18/2020.    Allergies (verified) Cozaar [losartan potassium], Lisinopril, and Statins   History: Past Medical History:  Diagnosis Date  . GERD (gastroesophageal reflux disease)   . Hyperlipidemia   . Hypertension   . Osteoarthritis    Past Surgical History:  Procedure Laterality Date  . ABDOMINAL HYSTERECTOMY  1987   Family History  Problem Relation Age of Onset  . Cancer Father   . Hypertension Brother   . Diabetes Brother   . Diabetes Paternal Grandfather   . Diabetes Paternal Uncle   . Colon cancer Neg Hx    Social History   Socioeconomic History  . Marital status: Legally Separated    Spouse name: Not on file  . Number of children: Not on file  . Years  of education: Not on file  . Highest education level: Not on file  Occupational History  . Not on file  Tobacco Use  . Smoking status: Never Smoker  . Smokeless tobacco: Never Used  Substance and Sexual Activity  . Alcohol use: No  . Drug use: No  . Sexual activity: Not Currently  Other Topics Concern  . Not on file  Social History Narrative  . Not on file   Social Determinants of Health   Financial Resource Strain: Low Risk   . Difficulty of Paying Living Expenses:  Not hard at all  Food Insecurity: No Food Insecurity  . Worried About Programme researcher, broadcasting/film/video in the Last Year: Never true  . Ran Out of Food in the Last Year: Never true  Transportation Needs: No Transportation Needs  . Lack of Transportation (Medical): No  . Lack of Transportation (Non-Medical): No  Physical Activity: Sufficiently Active  . Days of Exercise per Week: 5 days  . Minutes of Exercise per Session: 60 min  Stress: No Stress Concern Present  . Feeling of Stress : Not at all  Social Connections: Socially Integrated  . Frequency of Communication with Friends and Family: More than three times a week  . Frequency of Social Gatherings with Friends and Family: More than three times a week  . Attends Religious Services: More than 4 times per year  . Active Member of Clubs or Organizations: No  . Attends Banker Meetings: More than 4 times per year  . Marital Status: Living with partner    Tobacco Counseling Counseling given: Not Answered   Clinical Intake:  Pre-visit preparation completed: Yes  Pain : No/denies pain Pain Score: 0-No pain     BMI - recorded: 34.4 Nutritional Status: BMI > 30  Obese Nutritional Risks: None Diabetes: No  How often do you need to have someone help you when you read instructions, pamphlets, or other written materials from your doctor or pharmacy?: 1 - Never What is the last grade level you completed in school?: HSG; Some College  Diabetic? no  Interpreter Needed?: No  Information entered by :: Susie Cassette, LPN.   Activities of Daily Living In your present state of health, do you have any difficulty performing the following activities: 04/18/2020 03/31/2020  Hearing? N N  Vision? N N  Difficulty concentrating or making decisions? N N  Walking or climbing stairs? N N  Dressing or bathing? N N  Doing errands, shopping? N N  Preparing Food and eating ? N -  Using the Toilet? N -  In the past six months, have you  accidently leaked urine? N -  Do you have problems with loss of bowel control? N -  Managing your Medications? N -  Managing your Finances? N -  Housekeeping or managing your Housekeeping? N -  Some recent data might be hidden    Patient Care Team: Etta Grandchild, MD as PCP - General (Internal Medicine)  Indicate any recent Medical Services you may have received from other than [facility] providers in the past year (date may be approximate).     Assessment:   This is a routine wellness examination for Kayla Lowe.  Hearing/Vision screen No exam data present  Dietary issues and exercise activities discussed: Current Exercise Habits: Structured exercise class, Type of exercise: stretching;strength training/weights;treadmill;walking;Other - see comments (Silver Sneakers Program 5 x week for 45-60 mins.), Time (Minutes): 60, Frequency (Times/Week): 5, Weekly Exercise (Minutes/Week): 300, Intensity: Moderate, Exercise limited  by: respiratory conditions(s)  Goals    .  Client understands the importance of follow-up with providers by attending scheduled visits (pt-stated)      To maintain my current health status by continuing to eat healthy, stay physically active and socially active.      Depression Screen PHQ 2/9 Scores 04/18/2020 09/16/2019 11/04/2018 07/29/2018 03/27/2018 11/15/2017 08/12/2017  PHQ - 2 Score 0 0 0 0 0 0 0  PHQ- 9 Score - - - - - - -    Fall Risk Fall Risk  04/18/2020 09/16/2019 11/04/2018 03/27/2018 11/15/2017  Falls in the past year? 0 0 0 0 No  Number falls in past yr: 0 0 - - -  Injury with Fall? 0 0 - - -  Risk for fall due to : No Fall Risks No Fall Risks - - -  Follow up Falls evaluation completed Falls evaluation completed - - -    FALL RISK PREVENTION PERTAINING TO THE HOME:  Any stairs in or around the home? No  If so, are there any without handrails? No  Home free of loose throw rugs in walkways, pet beds, electrical cords, etc? Yes  Adequate lighting in your home to  reduce risk of falls? Yes   ASSISTIVE DEVICES UTILIZED TO PREVENT FALLS:  Life alert? No  Use of a cane, walker or w/c? No  Grab bars in the bathroom? No  Shower chair or bench in shower? No  Elevated toilet seat or a handicapped toilet? No   TIMED UP AND GO:  Was the test performed? No .  Length of time to ambulate 10 feet: 0 sec.   Gait steady and fast without use of assistive device  Cognitive Function:     6CIT Screen 04/18/2020  What Year? 0 points  What month? 0 points  What time? 0 points  Count back from 20 0 points  Months in reverse 0 points  Repeat phrase 0 points  Total Score 0    Immunizations Immunization History  Administered Date(s) Administered  . Fluad Quad(high Dose 65+) 03/14/2020  . Influenza,inj,Quad PF,6+ Mos 02/17/2014, 03/24/2015, 02/06/2016, 01/18/2017, 03/27/2018, 02/09/2019  . PFIZER SARS-COV-2 Vaccination 07/16/2019, 08/12/2019, 03/12/2020  . Pneumococcal Conjugate-13 07/29/2018  . Pneumococcal Polysaccharide-23 02/17/2014, 09/16/2019  . Tdap 08/03/2015    TDAP status: Up to date  Flu Vaccine status: Up to date  Pneumococcal vaccine status: Up to date  Covid-19 vaccine status: Completed vaccines  Qualifies for Shingles Vaccine? Yes   Zostavax completed No   Shingrix Completed?: No.    Education has been provided regarding the importance of this vaccine. Patient has been advised to call insurance company to determine out of pocket expense if they have not yet received this vaccine. Advised may also receive vaccine at local pharmacy or Health Dept. Verbalized acceptance and understanding.  Screening Tests Health Maintenance  Topic Date Due  . MAMMOGRAM  12/31/2020  . COLONOSCOPY  01/21/2024  . TETANUS/TDAP  08/02/2025  . INFLUENZA VACCINE  Completed  . DEXA SCAN  Completed  . COVID-19 Vaccine  Completed  . Hepatitis C Screening  Completed  . PNA vac Low Risk Adult  Completed    Health Maintenance  There are no preventive  care reminders to display for this patient.  Colorectal cancer screening: Type of screening: Colonoscopy. Completed 01/20/2014. Repeat every 10 years  Mammogram status: Completed 01/01/2019. Repeat every year  Bone Density status: Completed 01/01/2019. Results reflect: Bone density results: OSTEOPENIA. Repeat every 2 years.  Lung  Cancer Screening: (Low Dose CT Chest recommended if Age 36-80 years, 30 pack-year currently smoking OR have quit w/in 15years.) does not qualify.   Lung Cancer Screening Referral: no  Additional Screening:  Hepatitis C Screening: does qualify; Completed yes  Vision Screening: Recommended annual ophthalmology exams for early detection of glaucoma and other disorders of the eye. Is the patient up to date with their annual eye exam?  Yes  Who is the provider or what is the name of the office in which the patient attends annual eye exams? Norva Pavlov, MD with [facility] If pt is not established with a provider, would they like to be referred to a provider to establish care? No .   Dental Screening: Recommended annual dental exams for proper oral hygiene  Community Resource Referral / Chronic Care Management: CRR required this visit?  No   CCM required this visit?  No      Plan:     I have personally reviewed and noted the following in the patient's chart:   . Medical and social history . Use of alcohol, tobacco or illicit drugs  . Current medications and supplements . Functional ability and status . Nutritional status . Physical activity . Advanced directives . List of other physicians . Hospitalizations, surgeries, and ER visits in previous 12 months . Vitals . Screenings to include cognitive, depression, and falls . Referrals and appointments  In addition, I have reviewed and discussed with patient certain preventive protocols, quality metrics, and best practice recommendations. A written personalized care plan for preventive  services as well as general preventive health recommendations were provided to patient.     Mickeal Needy, LPN   [id number]   Nurse Notes: n/a

## 2020-04-18 NOTE — Patient Instructions (Signed)
Kayla Lowe , Thank you for taking time to come for your Medicare Wellness Visit. I appreciate your ongoing commitment to your health goals. Please review the following plan we discussed and let me know if I can assist you in the future.   Screening recommendations/referrals: Colonoscopy: 01/20/2014; due every 10 years Mammogram: 01/01/2019; due every 2 years Bone Density: 01/01/2019; due every 2 years Recommended yearly ophthalmology/optometry visit for glaucoma screening and checkup Recommended yearly dental visit for hygiene and checkup  Vaccinations: Influenza vaccine: 03/14/2020 Pneumococcal vaccine: up to date Tdap vaccine: 08/03/2015; due every 10 years Shingles vaccine: declined   Covid-19: up to date  Advanced directives: Advance directive discussed with you today. Even though you declined this today please call our office should you change your mind and we can give you the proper paperwork for you to fill out.  Conditions/risks identified: Yes; Reviewed health maintenance screenings with patient today and relevant education, vaccines, and/or referrals were provided. Please continue to do your personal lifestyle choices by: daily care of teeth and gums, regular physical activity (goal should be 5 days a week for 30 minutes), eat a healthy diet, avoid tobacco and drug use, limiting any alcohol intake, taking a low-dose aspirin (if not allergic or have been advised by your provider otherwise) and taking vitamins and minerals as recommended by your provider. Continue doing brain stimulating activities (puzzles, reading, adult coloring books, staying active) to keep memory sharp. Continue to eat heart healthy diet (full of fruits, vegetables, whole grains, lean protein, water--limit salt, fat, and sugar intake) and increase physical activity as tolerated.  Next appointment: Please schedule your next Medicare Wellness Visit with your Nurse Health Advisor in 1 year by calling  [phone].   Preventive Care 46 Years and Older, Female Preventive care refers to lifestyle choices and visits with your health care provider that can promote health and wellness. What does preventive care include?  A yearly physical exam. This is also called an annual well check.  Dental exams once or twice a year.  Routine eye exams. Ask your health care provider how often you should have your eyes checked.  Personal lifestyle choices, including:  Daily care of your teeth and gums.  Regular physical activity.  Eating a healthy diet.  Avoiding tobacco and drug use.  Limiting alcohol use.  Practicing safe sex.  Taking low-dose aspirin every day.  Taking vitamin and mineral supplements as recommended by your health care provider. What happens during an annual well check? The services and screenings done by your health care provider during your annual well check will depend on your age, overall health, lifestyle risk factors, and family history of disease. Counseling  Your health care provider may ask you questions about your:  Alcohol use.  Tobacco use.  Drug use.  Emotional well-being.  Home and relationship well-being.  Sexual activity.  Eating habits.  History of falls.  Memory and ability to understand (cognition).  Work and work Astronomer.  Reproductive health. Screening  You may have the following tests or measurements:  Height, weight, and BMI.  Blood pressure.  Lipid and cholesterol levels. These may be checked every 5 years, or more frequently if you are over 28 years old.  Skin check.  Lung cancer screening. You may have this screening every year starting at age 40 if you have a 30-pack-year history of smoking and currently smoke or have quit within the past 15 years.  Fecal occult blood test (FOBT) of the stool. You may  have this test every year starting at age 18.  Flexible sigmoidoscopy or colonoscopy. You may have a  sigmoidoscopy every 5 years or a colonoscopy every 10 years starting at age 67.  Hepatitis C blood test.  Hepatitis B blood test.  Sexually transmitted disease (STD) testing.  Diabetes screening. This is done by checking your blood sugar (glucose) after you have not eaten for a while (fasting). You may have this done every 1-3 years.  Bone density scan. This is done to screen for osteoporosis. You may have this done starting at age 56.  Mammogram. This may be done every 1-2 years. Talk to your health care provider about how often you should have regular mammograms. Talk with your health care provider about your test results, treatment options, and if necessary, the need for more tests. Vaccines  Your health care provider may recommend certain vaccines, such as:  Influenza vaccine. This is recommended every year.  Tetanus, diphtheria, and acellular pertussis (Tdap, Td) vaccine. You may need a Td booster every 10 years.  Zoster vaccine. You may need this after age 31.  Pneumococcal 13-valent conjugate (PCV13) vaccine. One dose is recommended after age 12.  Pneumococcal polysaccharide (PPSV23) vaccine. One dose is recommended after age 37. Talk to your health care provider about which screenings and vaccines you need and how often you need them. This information is not intended to replace advice given to you by your health care provider. Make sure you discuss any questions you have with your health care provider. Document Released: 05/27/2015 Document Revised: 01/18/2016 Document Reviewed: 03/01/2015 Elsevier Interactive Patient Education  2017 [place] Prevention in the Home Falls can cause injuries. They can happen to people of all ages. There are many things you can do to make your home safe and to help prevent falls. What can I do on the outside of my home?  Regularly fix the edges of walkways and driveways and fix any cracks.  Remove anything that might make you  trip as you walk through a door, such as a raised step or threshold.  Trim any bushes or trees on the path to your home.  Use bright outdoor lighting.  Clear any walking paths of anything that might make someone trip, such as rocks or tools.  Regularly check to see if handrails are loose or broken. Make sure that both sides of any steps have handrails.  Any raised decks and porches should have guardrails on the edges.  Have any leaves, snow, or ice cleared regularly.  Use sand or salt on walking paths during winter.  Clean up any spills in your garage right away. This includes oil or grease spills. What can I do in the bathroom?  Use night lights.  Install grab bars by the toilet and in the tub and shower. Do not use towel bars as grab bars.  Use non-skid mats or decals in the tub or shower.  If you need to sit down in the shower, use a plastic, non-slip stool.  Keep the floor dry. Clean up any water that spills on the floor as soon as it happens.  Remove soap buildup in the tub or shower regularly.  Attach bath mats securely with double-sided non-slip rug tape.  Do not have throw rugs and other things on the floor that can make you trip. What can I do in the bedroom?  Use night lights.  Make sure that you have a light by your bed that is easy  to reach.  Do not use any sheets or blankets that are too big for your bed. They should not hang down onto the floor.  Have a firm chair that has side arms. You can use this for support while you get dressed.  Do not have throw rugs and other things on the floor that can make you trip. What can I do in the kitchen?  Clean up any spills right away.  Avoid walking on wet floors.  Keep items that you use a lot in easy-to-reach places.  If you need to reach something above you, use a strong step stool that has a grab bar.  Keep electrical cords out of the way.  Do not use floor polish or wax that makes floors slippery. If  you must use wax, use non-skid floor wax.  Do not have throw rugs and other things on the floor that can make you trip. What can I do with my stairs?  Do not leave any items on the stairs.  Make sure that there are handrails on both sides of the stairs and use them. Fix handrails that are broken or loose. Make sure that handrails are as long as the stairways.  Check any carpeting to make sure that it is firmly attached to the stairs. Fix any carpet that is loose or worn.  Avoid having throw rugs at the top or bottom of the stairs. If you do have throw rugs, attach them to the floor with carpet tape.  Make sure that you have a light switch at the top of the stairs and the bottom of the stairs. If you do not have them, ask someone to add them for you. What else can I do to help prevent falls?  Wear shoes that:  Do not have high heels.  Have rubber bottoms.  Are comfortable and fit you well.  Are closed at the toe. Do not wear sandals.  If you use a stepladder:  Make sure that it is fully opened. Do not climb a closed stepladder.  Make sure that both sides of the stepladder are locked into place.  Ask someone to hold it for you, if possible.  Clearly mark and make sure that you can see:  Any grab bars or handrails.  First and last steps.  Where the edge of each step is.  Use tools that help you move around (mobility aids) if they are needed. These include:  Canes.  Walkers.  Scooters.  Crutches.  Turn on the lights when you go into a dark area. Replace any light bulbs as soon as they burn out.  Set up your furniture so you have a clear path. Avoid moving your furniture around.  If any of your floors are uneven, fix them.  If there are any pets around you, be aware of where they are.  Review your medicines with your doctor. Some medicines can make you feel dizzy. This can increase your chance of falling. Ask your doctor what other things that you can do to  help prevent falls. This information is not intended to replace advice given to you by your health care provider. Make sure you discuss any questions you have with your health care provider. Document Released: 02/24/2009 Document Revised: 10/06/2015 Document Reviewed: 06/04/2014 Elsevier Interactive Patient Education  2017 [employer].

## 2020-04-20 ENCOUNTER — Other Ambulatory Visit: Payer: Self-pay | Admitting: Internal Medicine

## 2020-04-20 ENCOUNTER — Encounter: Payer: Self-pay | Admitting: Internal Medicine

## 2020-05-11 ENCOUNTER — Other Ambulatory Visit: Payer: Self-pay | Admitting: Internal Medicine

## 2020-05-11 DIAGNOSIS — E559 Vitamin D deficiency, unspecified: Secondary | ICD-10-CM

## 2020-05-11 DIAGNOSIS — M85851 Other specified disorders of bone density and structure, right thigh: Secondary | ICD-10-CM

## 2020-05-15 ENCOUNTER — Encounter: Payer: Self-pay | Admitting: Internal Medicine

## 2020-05-16 ENCOUNTER — Encounter: Payer: Self-pay | Admitting: Internal Medicine

## 2020-05-16 ENCOUNTER — Other Ambulatory Visit: Payer: Medicare Other

## 2020-05-16 DIAGNOSIS — Z20822 Contact with and (suspected) exposure to covid-19: Secondary | ICD-10-CM

## 2020-05-16 NOTE — Telephone Encounter (Signed)
Called patient to schedule her to be tested at [facility] today or Thursday. Patient did not answer. LVM for her to call back and schedule.

## 2020-05-17 LAB — SARS-COV-2, NAA 2 DAY TAT

## 2020-05-17 LAB — NOVEL CORONAVIRUS, NAA: SARS-CoV-2, NAA: DETECTED — AB

## 2020-05-18 NOTE — Telephone Encounter (Signed)
   Patient positive for COVD Seeking advice for cough and congestion

## 2020-05-19 ENCOUNTER — Other Ambulatory Visit: Payer: Self-pay | Admitting: Internal Medicine

## 2020-05-19 DIAGNOSIS — U071 COVID-19: Secondary | ICD-10-CM

## 2020-05-19 DIAGNOSIS — J208 Acute bronchitis due to other specified organisms: Secondary | ICD-10-CM

## 2020-05-19 MED ORDER — HYDROCOD POLST-CPM POLST ER 10-8 MG/5ML PO SUER
5.0000 mL | Freq: Two times a day (BID) | ORAL | 0 refills | Status: DC | PRN
Start: 2020-05-19 — End: 2020-07-11

## 2020-06-20 ENCOUNTER — Encounter: Payer: Self-pay | Admitting: Internal Medicine

## 2020-06-20 ENCOUNTER — Ambulatory Visit (INDEPENDENT_AMBULATORY_CARE_PROVIDER_SITE_OTHER): Payer: Medicare Other | Admitting: Internal Medicine

## 2020-06-20 ENCOUNTER — Other Ambulatory Visit: Payer: Self-pay

## 2020-06-20 VITALS — BP 134/76 | HR 68 | Temp 98.5°F | Ht 64.0 in | Wt 210.0 lb

## 2020-06-20 DIAGNOSIS — I10 Essential (primary) hypertension: Secondary | ICD-10-CM | POA: Diagnosis not present

## 2020-06-20 DIAGNOSIS — J4530 Mild persistent asthma, uncomplicated: Secondary | ICD-10-CM | POA: Diagnosis not present

## 2020-06-20 DIAGNOSIS — L509 Urticaria, unspecified: Secondary | ICD-10-CM | POA: Diagnosis not present

## 2020-06-20 MED ORDER — PREDNISONE 10 MG PO TABS: ORAL_TABLET | ORAL | 0 refills | Status: DC

## 2020-06-20 NOTE — Assessment & Plan Note (Signed)
BP Readings from Last 3 Encounters:  06/20/20 134/76  04/18/20 130/80  03/31/20 136/78   Stable, pt to continue medical treatment norvasc, coreg   Current Outpatient Medications (Cardiovascular):  .  amLODipine (NORVASC) 10 MG tablet, Take 1 tablet (10 mg total) by mouth daily. .  carvedilol (COREG) 6.25 MG tablet, TAKE 1 TABLET(6.25 MG) BY MOUTH TWICE DAILY WITH A MEAL .  ezetimibe (ZETIA) 10 MG tablet, Take 1 tablet (10 mg total) by mouth daily. .  pravastatin (PRAVACHOL) 20 MG tablet, TAKE 1 TABLET(20 MG) BY MOUTH DAILY  Current Outpatient Medications (Respiratory):  .  chlorpheniramine-HYDROcodone (TUSSIONEX PENNKINETIC ER) 10-8 MG/5ML SUER, Take 5 mLs by mouth every 12 (twelve) hours as needed for cough. .  fluticasone (FLONASE) 50 MCG/ACT nasal spray, PLACE 2 SPRAYS INTO BOTH NOSTRILS DAILY. .  fluticasone (FLOVENT HFA) 110 MCG/ACT inhaler, Inhale 1 puff into the lungs in the morning and at bedtime. .  montelukast (SINGULAIR) 10 MG tablet, TAKE 1 TABLET(10 MG) BY MOUTH AT BEDTIME  Current Outpatient Medications (Analgesics):  .  nabumetone (RELAFEN) 500 MG tablet, TAKE 1 TABLET(500 MG) BY MOUTH TWICE DAILY AS NEEDED   Current Outpatient Medications (Other):  Marland Kitchen  Cholecalciferol (VITAMIN D3) 1.25 MG (50000 UT) CAPS, TAKE 1 CAPSULE BY MOUTH 1 TIME A WEEK .  co-enzyme Q-10 50 MG capsule, Take 100 mg by mouth daily after lunch. .  esomeprazole (NEXIUM) 20 MG capsule, Take 1 capsule (20 mg total) by mouth daily as needed. .  fluocinonide-emollient (LIDEX-E) 0.05 % cream, Apply 1 application topically 2 (two) times daily. .  Omega-3 Fatty Acids (FISH OIL) 1000 MG CAPS, Take 1,000 mg by mouth every evening.

## 2020-06-20 NOTE — Assessment & Plan Note (Signed)
Stable, to continue current med tx - flovent, singulair

## 2020-06-20 NOTE — Patient Instructions (Signed)
You had the steroid shot today  Please take all new medication as prescribed - the prednisone  Please continue all other medications as before, and refills have been done if requested.  Please have the pharmacy call with any other refills you may need  Please keep your appointments with your specialists as you may have planned     

## 2020-06-20 NOTE — Assessment & Plan Note (Signed)
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

## 2020-06-20 NOTE — Progress Notes (Signed)
Patient ID: Kayla Lowe, female   DOB: Dec 21, 1955, 65 y.o.   MRN: [id number]        Chief Complaint: rash       HPI:  Kayla Lowe is a 65 y.o. female here with c/o rash with marked itching to the upper torso and arms mostly , but also the right hip area.  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  O/w has no new meds, soaps, detergents or other contact issues.  Pt denies chest pain, increased sob or doe, wheezing, orthopnea, PND, increased LE swelling, palpitations, dizziness or syncope, and denies tongue swelling or pain.  No other angioedema like symptoms. She did try benadryl po x 1 last pm and helped but only lasted about 6 hrs, woke up at 3 am itching again .       Wt Readings from Last 3 Encounters:  06/20/20 210 lb (95.3 kg)  04/18/20 200 lb 6.4 oz (90.9 kg)  03/31/20 213 lb (96.6 kg)   BP Readings from Last 3 Encounters:  06/20/20 134/76  04/18/20 130/80  03/31/20 136/78         Past Medical History:  Diagnosis Date  . GERD (gastroesophageal reflux disease)   . Hyperlipidemia   . Hypertension   . Osteoarthritis    Past Surgical History:  Procedure Laterality Date  . ABDOMINAL HYSTERECTOMY  1987    reports that she has never smoked. She has never used smokeless tobacco. She reports that she does not drink alcohol and does not use drugs. family history includes Cancer in her father; Diabetes in her brother, paternal grandfather, and paternal uncle; Hypertension in her brother. Allergies  Allergen Reactions  . Cozaar [Losartan Potassium]     Caused cough and sweats. Tingling in legs.  . Lisinopril Cough  . Statins Other (See Comments)    myalgias   Current Outpatient Medications on File Prior to Visit  Medication Sig Dispense Refill  . amLODipine (NORVASC) 10 MG  tablet Take 1 tablet (10 mg total) by mouth daily. 90 tablet 0  . carvedilol (COREG) 6.25 MG tablet TAKE 1 TABLET(6.25 MG) BY MOUTH TWICE DAILY WITH A MEAL 180 tablet 0  . chlorpheniramine-HYDROcodone (TUSSIONEX PENNKINETIC ER) 10-8 MG/5ML SUER Take 5 mLs by mouth every 12 (twelve) hours as needed for cough. 140 mL 0  . Cholecalciferol (VITAMIN D3) 1.25 MG (50000 UT) CAPS TAKE 1 CAPSULE BY MOUTH 1 TIME A WEEK 12 capsule 0  . co-enzyme Q-10 50 MG capsule Take 100 mg by mouth daily after lunch.    . esomeprazole (NEXIUM) 20 MG capsule Take 1 capsule (20 mg total) by mouth daily as needed. 30 capsule 4  . ezetimibe (ZETIA) 10 MG tablet Take 1 tablet (10 mg total) by mouth daily. 90 tablet 1  . fluocinonide-emollient (LIDEX-E) 0.05 % cream Apply 1 application topically 2 (two) times daily. 60 g 3  . fluticasone (FLONASE) 50 MCG/ACT nasal spray PLACE 2 SPRAYS INTO BOTH NOSTRILS DAILY. 16 g 2  . fluticasone (FLOVENT HFA) 110 MCG/ACT inhaler Inhale 1 puff into the lungs in the morning and at bedtime. 3 each 1  . montelukast (SINGULAIR) 10 MG tablet TAKE 1 TABLET(10 MG) BY MOUTH AT BEDTIME 90 tablet 1  . nabumetone (RELAFEN) 500 MG tablet TAKE 1 TABLET(500 MG) BY MOUTH  TWICE DAILY AS NEEDED 180 tablet 1  . Omega-3 Fatty Acids (FISH OIL) 1000 MG CAPS Take 1,000 mg by mouth every evening.    . pravastatin (PRAVACHOL) 20 MG tablet TAKE 1 TABLET(20 MG) BY MOUTH DAILY 90 tablet 1   No current facility-administered medications on file prior to visit.        ROS:  All others reviewed and negative.  Objective        PE:  BP 134/76   Pulse 68   Temp 98.5 F (36.9 C) (Oral)   Ht 5\' 4"  (1.626 m)   Wt 210 lb (95.3 kg)   SpO2 99%   BMI 36.05 kg/m                 Constitutional: Pt appears in NAD               HENT: Head: NCAT.                Right Ear: External ear normal.                 Left Ear: External ear normal.                Eyes: . Pupils are equal, round, and reactive to light. Conjunctivae  and EOM are normal               Nose: without d/c or deformity               Neck: Neck supple. Gross normal ROM               Cardiovascular: Normal rate and regular rhythm.                 Pulmonary/Chest: Effort normal and breath sounds without rales or wheezing.                Abd:  Soft, NT, ND, + BS, no organomegaly               Neurological: Pt is alert. At baseline orientation, motor grossly intact               Skin: Skin is warm. LE edema - none but has large area primarily upper torso front and back, as well as both arms and right lateral hip area with nontender blanching lesions that seem to come and go               Psychiatric: Pt behavior is normal without agitation   Micro: none  Cardiac tracings I have personally interpreted today:  none  Pertinent Radiological findings (summarize): none   Lab Results  Component Value Date   WBC 6.6 09/16/2019   HGB 12.7 09/16/2019   HCT 37.8 09/16/2019   PLT 326.0 09/16/2019   GLUCOSE 88 03/14/2020   CHOL 246 (H) 03/14/2020   TRIG 119.0 03/14/2020   HDL 56.60 03/14/2020   LDLDIRECT 220.0 09/16/2019   LDLCALC 166 (H) 03/14/2020   ALT 11 09/16/2019   AST 12 09/16/2019   NA 141 03/14/2020   K 4.1 03/14/2020   CL 106 03/14/2020   CREATININE 0.97 03/14/2020   BUN 11 03/14/2020   CO2 28 03/14/2020   TSH 1.80 09/16/2019   INR 0.97 07/10/2013   HGBA1C 5.4 08/31/2019   Assessment/Plan:  Kayla Lowe is a 65 y.o. Black or African American [2] female with  has a past medical history of GERD (gastroesophageal reflux disease), Hyperlipidemia, Hypertension, and Osteoarthritis.  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  amLODipine (NORVASC) 10 MG tablet, Take 1 tablet (10 mg total) by mouth daily. .  carvedilol (COREG) 6.25 MG tablet, TAKE 1 TABLET(6.25 MG) BY MOUTH TWICE DAILY WITH A MEAL .  ezetimibe (ZETIA) 10 MG tablet, Take 1 tablet (10 mg total) by mouth daily. .  pravastatin (PRAVACHOL) 20 MG tablet, TAKE 1 TABLET(20 MG) BY MOUTH DAILY  Current Outpatient Medications (Respiratory):  .  chlorpheniramine-HYDROcodone (TUSSIONEX PENNKINETIC ER) 10-8 MG/5ML SUER, Take 5 mLs by mouth every 12 (twelve) hours as needed for cough. .  fluticasone (FLONASE) 50 MCG/ACT nasal spray, PLACE 2 SPRAYS INTO BOTH NOSTRILS DAILY. .  fluticasone (FLOVENT HFA) 110 MCG/ACT inhaler, Inhale 1 puff into the lungs in the morning and at bedtime. .  montelukast (SINGULAIR) 10 MG tablet, TAKE 1 TABLET(10 MG) BY MOUTH AT BEDTIME  Current Outpatient Medications (Analgesics):  .  nabumetone (RELAFEN) 500 MG tablet, TAKE 1 TABLET(500 MG) BY MOUTH TWICE DAILY AS NEEDED   Current Outpatient Medications (Other):  Marland Kitchen  Cholecalciferol (VITAMIN D3) 1.25 MG (50000 UT) CAPS, TAKE 1 CAPSULE BY MOUTH 1 TIME A WEEK .  co-enzyme Q-10 50 MG capsule, Take 100 mg by mouth daily after lunch. .  esomeprazole (NEXIUM) 20 MG capsule, Take 1 capsule (20 mg total) by mouth daily as needed. .  fluocinonide-emollient (LIDEX-E) 0.05 % cream, Apply 1 application topically 2 (two) times daily. .  Omega-3 Fatty Acids (FISH OIL) 1000 MG CAPS, Take 1,000 mg by mouth every evening.   Mild persistent asthma without complication Stable, to continue current med tx - flovent, singulair   Followup: Return if symptoms worsen or fail to improve.  Oliver Barre, MD 06/20/2020 3:19 PM [facility]

## 2020-07-11 ENCOUNTER — Other Ambulatory Visit: Payer: Self-pay

## 2020-07-11 ENCOUNTER — Encounter: Payer: Self-pay | Admitting: Internal Medicine

## 2020-07-11 ENCOUNTER — Ambulatory Visit (INDEPENDENT_AMBULATORY_CARE_PROVIDER_SITE_OTHER): Payer: Medicare Other | Admitting: Internal Medicine

## 2020-07-11 VITALS — BP 126/92 | HR 83 | Temp 98.4°F | Resp 16 | Ht 64.0 in | Wt 212.0 lb

## 2020-07-11 DIAGNOSIS — R252 Cramp and spasm: Secondary | ICD-10-CM | POA: Insufficient documentation

## 2020-07-11 DIAGNOSIS — M170 Bilateral primary osteoarthritis of knee: Secondary | ICD-10-CM | POA: Diagnosis not present

## 2020-07-11 DIAGNOSIS — J301 Allergic rhinitis due to pollen: Secondary | ICD-10-CM

## 2020-07-11 DIAGNOSIS — G5762 Lesion of plantar nerve, left lower limb: Secondary | ICD-10-CM

## 2020-07-11 DIAGNOSIS — N1831 Chronic kidney disease, stage 3a: Secondary | ICD-10-CM

## 2020-07-11 DIAGNOSIS — I10 Essential (primary) hypertension: Secondary | ICD-10-CM | POA: Diagnosis not present

## 2020-07-11 DIAGNOSIS — Z1231 Encounter for screening mammogram for malignant neoplasm of breast: Secondary | ICD-10-CM

## 2020-07-11 DIAGNOSIS — E785 Hyperlipidemia, unspecified: Secondary | ICD-10-CM

## 2020-07-11 LAB — LIPID PANEL
Cholesterol: 260 mg/dL — ABNORMAL HIGH (ref 0–200)
HDL: 62.4 mg/dL (ref >39.00)
LDL Cholesterol: 170 mg/dL — ABNORMAL HIGH (ref 0–99)
NonHDL: 197.37
Total CHOL/HDL Ratio: 4
Triglycerides: 135 mg/dL (ref 0.0–149.0)
VLDL: 27 mg/dL (ref 0.0–40.0)

## 2020-07-11 LAB — MAGNESIUM: Magnesium: 2.3 mg/dL (ref 1.5–2.5)

## 2020-07-11 LAB — BASIC METABOLIC PANEL
BUN: 16 mg/dL (ref 6–23)
CO2: 28 mEq/L (ref 19–32)
Calcium: 10.6 mg/dL — ABNORMAL HIGH (ref 8.4–10.5)
Chloride: 106 mEq/L (ref 96–112)
Creatinine, Ser: 1.05 mg/dL (ref 0.40–1.20)
GFR: 55.19 mL/min — ABNORMAL LOW (ref >60.00)
Glucose, Bld: 91 mg/dL (ref 70–99)
Potassium: 4.4 mEq/L (ref 3.5–5.1)
Sodium: 142 mEq/L (ref 135–145)

## 2020-07-11 LAB — CK: Total CK: 54 U/L (ref 7–177)

## 2020-07-11 MED ORDER — MONTELUKAST SODIUM 10 MG PO TABS: 10.0000 mg | ORAL_TABLET | Freq: Every day | ORAL | 1 refills | Status: DC

## 2020-07-11 MED ORDER — EZETIMIBE 10 MG PO TABS: 10.0000 mg | ORAL_TABLET | Freq: Every day | ORAL | 1 refills | Status: DC

## 2020-07-11 MED ORDER — AMLODIPINE BESYLATE 10 MG PO TABS: 10.0000 mg | ORAL_TABLET | Freq: Every day | ORAL | 1 refills | Status: DC

## 2020-07-11 MED ORDER — NABUMETONE 500 MG PO TABS
500.0000 mg | ORAL_TABLET | Freq: Two times a day (BID) | ORAL | 1 refills | Status: DC | PRN
Start: 2020-07-11 — End: 2021-01-09

## 2020-07-11 MED ORDER — CARVEDILOL 6.25 MG PO TABS: ORAL_TABLET | ORAL | 1 refills | Status: DC

## 2020-07-11 NOTE — Patient Instructions (Signed)

## 2020-07-11 NOTE — Progress Notes (Signed)
Subjective:  Patient ID: Kayla Lowe, female    DOB: [birth year]  Age: 65 y.o. MRN: [id number]  CC: Hypertension and Hyperlipidemia  This visit occurred during the SARS-CoV-2 public health emergency.  Safety protocols were in place, including screening questions prior to the visit, additional usage of staff PPE, and extensive cleaning of exam room while observing appropriate contact time as indicated for disinfecting solutions.    HPI Kayla Lowe presents for f/up -   She continues to complain of nocturnal leg cramps and pain in her large joints.  The leg cramps are always at rest.  She denies claudications.  She tells me she is compliant with her current meds.  She is active and denies any recent episodes of chest pain, shortness of breath, palpitations, edema, or fatigue.  Outpatient Medications Prior to Visit  Medication Sig Dispense Refill  . Cholecalciferol (VITAMIN D3) 1.25 MG (50000 UT) CAPS TAKE 1 CAPSULE BY MOUTH 1 TIME A WEEK 12 capsule 0  . esomeprazole (NEXIUM) 20 MG capsule Take 1 capsule (20 mg total) by mouth daily as needed. 30 capsule 4  . fluticasone (FLONASE) 50 MCG/ACT nasal spray PLACE 2 SPRAYS INTO BOTH NOSTRILS DAILY. 16 g 2  . Omega-3 Fatty Acids (FISH OIL) 1000 MG CAPS Take 1,000 mg by mouth every evening.    . pravastatin (PRAVACHOL) 20 MG tablet TAKE 1 TABLET(20 MG) BY MOUTH DAILY 90 tablet 1  . amLODipine (NORVASC) 10 MG tablet Take 1 tablet (10 mg total) by mouth daily. 90 tablet 0  . carvedilol (COREG) 6.25 MG tablet TAKE 1 TABLET(6.25 MG) BY MOUTH TWICE DAILY WITH A MEAL 180 tablet 0  . chlorpheniramine-HYDROcodone (TUSSIONEX PENNKINETIC ER) 10-8 MG/5ML SUER Take 5 mLs by mouth every 12 (twelve) hours as needed for cough. 140 mL 0  . co-enzyme Q-10 50 MG capsule Take 100 mg by mouth daily after lunch.    . ezetimibe (ZETIA) 10 MG tablet Take 1 tablet (10 mg total) by mouth daily. 90 tablet 1  . fluocinonide-emollient (LIDEX-E)  0.05 % cream Apply 1 application topically 2 (two) times daily. 60 g 3  . fluticasone (FLOVENT HFA) 110 MCG/ACT inhaler Inhale 1 puff into the lungs in the morning and at bedtime. 3 each 1  . montelukast (SINGULAIR) 10 MG tablet TAKE 1 TABLET(10 MG) BY MOUTH AT BEDTIME 90 tablet 1  . nabumetone (RELAFEN) 500 MG tablet TAKE 1 TABLET(500 MG) BY MOUTH TWICE DAILY AS NEEDED 180 tablet 1  . predniSONE (DELTASONE) 10 MG tablet 3 tabs by mouth per day for 3 days,2tabs per day for 3 days,1tab per day for 3 days 18 tablet 0   No facility-administered medications prior to visit.    ROS Review of Systems  Constitutional: Negative for appetite change, diaphoresis, fatigue and unexpected weight change.  HENT: Negative.   Eyes: Negative.   Respiratory: Negative for cough, chest tightness, shortness of breath and wheezing.   Cardiovascular: Negative for chest pain, palpitations and leg swelling.  Gastrointestinal: Negative for abdominal pain, constipation, diarrhea, nausea and vomiting.  Endocrine: Negative.   Genitourinary: Negative.  Negative for difficulty urinating and dysuria.  Musculoskeletal: Positive for arthralgias. Negative for myalgias.  Neurological: Negative.  Negative for dizziness, weakness and light-headedness.  Hematological: Negative for adenopathy. Does not bruise/bleed easily.  Psychiatric/Behavioral: Negative.     Objective:  BP (!) 126/92   Pulse 83   Temp 98.4 F (36.9 C) (Oral)   Resp 16   Ht 5\' 4"  (1.626 m)  Wt 212 lb (96.2 kg)   SpO2 94%   BMI 36.39 kg/m   BP Readings from Last 3 Encounters:  07/11/20 (!) 126/92  06/20/20 134/76  04/18/20 130/80    Wt Readings from Last 3 Encounters:  07/11/20 212 lb (96.2 kg)  06/20/20 210 lb (95.3 kg)  04/18/20 200 lb 6.4 oz (90.9 kg)    Physical Exam Vitals reviewed.  Constitutional:      Appearance: Normal appearance.  HENT:     Nose: Nose normal.     Mouth/Throat:     Mouth: Mucous membranes are moist.  Eyes:      General: No scleral icterus.    Conjunctiva/sclera: Conjunctivae normal.  Cardiovascular:     Rate and Rhythm: Normal rate and regular rhythm.     Heart sounds: No murmur heard.   Pulmonary:     Effort: Pulmonary effort is normal.     Breath sounds: No stridor. No wheezing, rhonchi or rales.  Abdominal:     General: Abdomen is protuberant. Bowel sounds are normal. There is no distension.     Palpations: Abdomen is soft. There is no hepatomegaly, splenomegaly or mass.     Tenderness: There is no abdominal tenderness.  Musculoskeletal:        General: Normal range of motion.     Cervical back: Neck supple.     Right lower leg: No edema.     Left lower leg: No edema.  Lymphadenopathy:     Cervical: No cervical adenopathy.  Skin:    General: Skin is warm and dry.     Coloration: Skin is not pale.  Neurological:     General: No focal deficit present.     Mental Status: She is alert.  Psychiatric:        Mood and Affect: Mood normal.        Behavior: Behavior normal.     Lab Results  Component Value Date   WBC 6.6 09/16/2019   HGB 12.7 09/16/2019   HCT 37.8 09/16/2019   PLT 326.0 09/16/2019   GLUCOSE 91 07/11/2020   CHOL 260 (H) 07/11/2020   TRIG 135.0 07/11/2020   HDL 62.40 07/11/2020   LDLDIRECT 220.0 09/16/2019   LDLCALC 170 (H) 07/11/2020   ALT 11 09/16/2019   AST 12 09/16/2019   NA 142 07/11/2020   K 4.4 07/11/2020   CL 106 07/11/2020   CREATININE 1.05 07/11/2020   BUN 16 07/11/2020   CO2 28 07/11/2020   TSH 1.80 09/16/2019   INR 0.97 07/10/2013   HGBA1C 5.4 08/31/2019    DG Bone Density  Result Date: 01/01/2019 EXAM: DUAL X-RAY ABSORPTIOMETRY (DXA) FOR BONE MINERAL DENSITY IMPRESSION: Referring Physician:  Marcine Matar Your patient completed a BMD test using Lunar IDXA DXA system ( analysis version: 16 ) manufactured by [employer]. Technologist: CG PATIENT: Name: Kayla Lowe, Kayla Lowe Patient ID:  [id number] Birth Date:  04-30-1956 Height:     64.0 in. Sex:         Female Measured:   01/01/2019 Weight:     214.4 lbs. 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 This patient is considered OSTEOPENIC according to World Health Organization [facility]) criteria. The scan quality is good. Lumbar spine was not utilized due to advanced degenerative. Site Region Measured Date Measured Age YA T-score BMD Significant CHANGE  DualFemur Neck Right 01/01/2019 65.4 -1.3 0.854 g/cm2 DualFemur Total Mean 01/01/2019 65.4 -0.7 0.914 g/cm2 Right Forearm Radius 33% 01/01/2019 65.4 -0.7 0.828 g/cm2 World Health Organization [facility]) criteria for post-menopausal, Caucasian Women: Normal       T-score at or above -1 SD Osteopenia   T-score between -1 and -2.5 SD Osteoporosis T-score at or below -2.5 SD RECOMMENDATION: 1. All patients should optimize calcium and vitamin D intake. 2. Consider FDA approved medical therapies in postmenopausal women and men aged 44 years and older, based on the following: a. A hip or vertebral (clinical or morphometric) fracture b. T- score < or = -2.5 at the femoral neck or spine after appropriate evaluation to exclude secondary causes c. Low bone mass (T-score between -1.0 and -2.5 at the femoral neck or spine) and a 10 year probability of a hip fracture > or = 3% or a 10 year probability of a major osteoporosis-related fracture > or = 20% based on the [place]-adapted WHO algorithm d. Clinician judgment and/or patient preferences may indicate treatment for people with 10-year fracture probabilities above or below these levels FOLLOW-UP: Patients with diagnosis of osteoporosis or at high risk for fracture should have regular bone mineral density tests. For patients eligible for Medicare, routine testing is allowed once every  2 years. The testing frequency can be increased to one year for patients who have rapidly progressing disease, those who are receiving or discontinuing medical therapy to restore bone mass, or have additional risk factors. [facility] FRAX* 10-year Probability of Fracture Based on femoral neck BMD: DualFemur (Right) Major Osteoporotic Fracture: 4.5% Hip Fracture:                0.4% Population:                  [place] ([place]) Risk Factors:                Rheumatoid Arthritis (714.0) *FRAX is a Armed forces logistics/support/administrative officer of the [employer] of [employer] for Metabolic Bone Disease, a World Science writer (WHO) [employer]. ASSESSMENT: The probability of a major osteoporotic fracture is 4.5% within the next ten years. The probability of a hip fracture is 0.4% within the next ten years. I have reviewed this report and agree with the above findings. Mark A. Tyron Russell, M.D. [facility] Electronically Signed   By: Ulyses Southward M.D.   On: 01/01/2019 12:40   MM 3D SCREEN BREAST BILATERAL  Result Date: 01/01/2019 CLINICAL DATA:  Screening. EXAM: DIGITAL SCREENING BILATERAL MAMMOGRAM WITH TOMO AND CAD COMPARISON:  Previous exam(s). ACR Breast Density Category b: There are scattered areas of fibroglandular density. FINDINGS: There are no findings suspicious for malignancy. Images were processed with CAD. IMPRESSION: No mammographic evidence of malignancy. A result letter of this screening mammogram will be mailed directly to the patient. RECOMMENDATION: Screening mammogram in one year. (Code:SM-B-01Y) BI-RADS CATEGORY  1: Negative. Electronically Signed   By: Sherian Rein M.D.   On: 01/01/2019 18:10    Assessment & Plan:   Lavella was seen today for hypertension and hyperlipidemia.  Diagnoses and all orders for this visit:  Primary osteoarthritis of both knees -     nabumetone (RELAFEN) 500 MG tablet; Take 1 tablet (500 mg total) by mouth 2 (two) times daily as needed.  Morton's  neuroma of left foot  Seasonal allergic rhinitis due to pollen -     montelukast (SINGULAIR) 10 MG tablet; Take 1 tablet (10 mg total) by mouth  at bedtime.  Dyslipidemia, goal LDL below 130- Her LDL remains high.  I doubt she is taking the statin.  Will add zetia. -     ezetimibe (ZETIA) 10 MG tablet; Take 1 tablet (10 mg total) by mouth daily. -     CK; Future -     Lipid panel; Future -     Lipid panel -     CK  Essential hypertension- Her blood pressure is not adequately well controlled.  I encouraged her to improve her lifestyle modifications. -     carvedilol (COREG) 6.25 MG tablet; TAKE 1 TABLET(6.25 MG) BY MOUTH TWICE DAILY WITH A MEAL -     amLODipine (NORVASC) 10 MG tablet; Take 1 tablet (10 mg total) by mouth daily. -     Basic metabolic panel; Future -     Magnesium; Future -     Magnesium -     Basic metabolic panel  Bilateral leg cramps- The work-up for secondary causes is unremarkable. -     Basic metabolic panel; Future -     Magnesium; Future -     CK; Future -     CK -     Magnesium -     Basic metabolic panel  Hypercalcemia-I have asked her to return to be screened for hyperparathyroidism. -     VITAMIN D 25 Hydroxy (Vit-D Deficiency, Fractures); Future -     Phosphorus; Future -     PTH, intact and calcium; Future  Stage 3a chronic kidney disease (HCC)- Her renal function is stable.  Visit for screening mammogram -     MM DIGITAL SCREENING BILATERAL; Future   I have discontinued Kayla Lowe's co-enzyme Q-10, Flovent HFA, fluocinonide-emollient, chlorpheniramine-HYDROcodone, and predniSONE. I have also changed her nabumetone and montelukast. Additionally, I am having her maintain her Fish Oil, fluticasone, esomeprazole, pravastatin, Vitamin D3, ezetimibe, carvedilol, and amLODipine.  Meds ordered this encounter  Medications  . nabumetone (RELAFEN) 500 MG tablet    Sig: Take 1 tablet (500 mg total) by mouth 2 (two) times daily as needed.     Dispense:  180 tablet    Refill:  1  . montelukast (SINGULAIR) 10 MG tablet    Sig: Take 1 tablet (10 mg total) by mouth at bedtime.    Dispense:  90 tablet    Refill:  1  . ezetimibe (ZETIA) 10 MG tablet    Sig: Take 1 tablet (10 mg total) by mouth daily.    Dispense:  90 tablet    Refill:  1  . carvedilol (COREG) 6.25 MG tablet    Sig: TAKE 1 TABLET(6.25 MG) BY MOUTH TWICE DAILY WITH A MEAL    Dispense:  180 tablet    Refill:  1  . amLODipine (NORVASC) 10 MG tablet    Sig: Take 1 tablet (10 mg total) by mouth daily.    Dispense:  90 tablet    Refill:  1     Follow-up: Return in about 6 months (around 01/08/2021).  Sanda Linger, MD

## 2020-07-12 DIAGNOSIS — Z1231 Encounter for screening mammogram for malignant neoplasm of breast: Secondary | ICD-10-CM | POA: Insufficient documentation

## 2020-07-12 DIAGNOSIS — N1831 Chronic kidney disease, stage 3a: Secondary | ICD-10-CM | POA: Insufficient documentation

## 2020-07-19 ENCOUNTER — Encounter: Payer: Self-pay | Admitting: Internal Medicine

## 2020-07-20 ENCOUNTER — Encounter: Payer: Self-pay | Admitting: Internal Medicine

## 2020-08-03 DIAGNOSIS — H25813 Combined forms of age-related cataract, bilateral: Secondary | ICD-10-CM | POA: Diagnosis not present

## 2020-08-03 DIAGNOSIS — H47292 Other optic atrophy, left eye: Secondary | ICD-10-CM | POA: Diagnosis not present

## 2020-08-28 ENCOUNTER — Other Ambulatory Visit: Payer: Self-pay | Admitting: Internal Medicine

## 2020-08-28 DIAGNOSIS — J301 Allergic rhinitis due to pollen: Secondary | ICD-10-CM

## 2020-09-14 ENCOUNTER — Other Ambulatory Visit (HOSPITAL_BASED_OUTPATIENT_CLINIC_OR_DEPARTMENT_OTHER): Payer: Self-pay

## 2020-09-14 ENCOUNTER — Other Ambulatory Visit: Payer: Self-pay

## 2020-09-14 ENCOUNTER — Ambulatory Visit: Payer: Medicare Other | Attending: Internal Medicine

## 2020-09-14 DIAGNOSIS — Z23 Encounter for immunization: Secondary | ICD-10-CM

## 2020-09-14 MED ORDER — PFIZER-BIONT COVID-19 VAC-TRIS 30 MCG/0.3ML IM SUSP
INTRAMUSCULAR | 0 refills | Status: DC
  Filled 2020-09-14: qty 0.3, 1d supply, fill #0

## 2020-09-14 NOTE — Progress Notes (Signed)
   Covid-19 Vaccination Clinic  Name:  Kayla Lowe    MRN: [id number] DOB: 1956/03/09  09/14/2020  [name] was observed post Covid-19 immunization for 15 minutes without incident. She was provided with Vaccine Information Sheet and instruction to access the V-Safe system.   [name] was instructed to call 911 with any severe reactions post vaccine: Marland Kitchen Difficulty breathing  . Swelling of face and throat  . A fast heartbeat  . A bad rash all over body  . Dizziness and weakness   Immunizations Administered    Name Date Dose VIS Date Route   PFIZER Comrnaty(Gray TOP) Covid-19 Vaccine 09/14/2020 12:21 PM 0.3 mL 04/21/2020 Intramuscular   Manufacturer: [employer], [employer]   Lot: [id number]   NDC: [id number]

## 2020-09-24 ENCOUNTER — Other Ambulatory Visit: Payer: Self-pay

## 2020-09-24 ENCOUNTER — Emergency Department (HOSPITAL_COMMUNITY): Payer: No Typology Code available for payment source

## 2020-09-24 ENCOUNTER — Emergency Department (HOSPITAL_COMMUNITY)
Admission: EM | Admit: 2020-09-24 | Discharge: 2020-09-24 | Disposition: A | Discharge status: Home / Self Care | Payer: No Typology Code available for payment source | Attending: Emergency Medicine | Admitting: Emergency Medicine

## 2020-09-24 DIAGNOSIS — Z8616 Personal history of COVID-19: Secondary | ICD-10-CM | POA: Diagnosis not present

## 2020-09-24 DIAGNOSIS — J4530 Mild persistent asthma, uncomplicated: Secondary | ICD-10-CM | POA: Insufficient documentation

## 2020-09-24 DIAGNOSIS — Z743 Need for continuous supervision: Secondary | ICD-10-CM | POA: Diagnosis not present

## 2020-09-24 DIAGNOSIS — Y92410 Unspecified street and highway as the place of occurrence of the external cause: Secondary | ICD-10-CM | POA: Insufficient documentation

## 2020-09-24 DIAGNOSIS — M25561 Pain in right knee: Secondary | ICD-10-CM | POA: Diagnosis not present

## 2020-09-24 DIAGNOSIS — M5459 Other low back pain: Secondary | ICD-10-CM | POA: Diagnosis not present

## 2020-09-24 DIAGNOSIS — S80919A Unspecified superficial injury of unspecified knee, initial encounter: Secondary | ICD-10-CM | POA: Diagnosis not present

## 2020-09-24 DIAGNOSIS — M545 Low back pain, unspecified: Secondary | ICD-10-CM | POA: Insufficient documentation

## 2020-09-24 DIAGNOSIS — M47816 Spondylosis without myelopathy or radiculopathy, lumbar region: Secondary | ICD-10-CM | POA: Diagnosis not present

## 2020-09-24 DIAGNOSIS — Z79899 Other long term (current) drug therapy: Secondary | ICD-10-CM | POA: Diagnosis not present

## 2020-09-24 DIAGNOSIS — N1831 Chronic kidney disease, stage 3a: Secondary | ICD-10-CM | POA: Insufficient documentation

## 2020-09-24 DIAGNOSIS — M25551 Pain in right hip: Secondary | ICD-10-CM | POA: Diagnosis not present

## 2020-09-24 DIAGNOSIS — I129 Hypertensive chronic kidney disease with stage 1 through stage 4 chronic kidney disease, or unspecified chronic kidney disease: Secondary | ICD-10-CM | POA: Diagnosis not present

## 2020-09-24 DIAGNOSIS — R0902 Hypoxemia: Secondary | ICD-10-CM | POA: Diagnosis not present

## 2020-09-24 DIAGNOSIS — M25562 Pain in left knee: Secondary | ICD-10-CM | POA: Insufficient documentation

## 2020-09-24 DIAGNOSIS — M546 Pain in thoracic spine: Secondary | ICD-10-CM | POA: Diagnosis not present

## 2020-09-24 DIAGNOSIS — Z7951 Long term (current) use of inhaled steroids: Secondary | ICD-10-CM | POA: Diagnosis not present

## 2020-09-24 DIAGNOSIS — M5134 Other intervertebral disc degeneration, thoracic region: Secondary | ICD-10-CM | POA: Diagnosis not present

## 2020-09-24 MED ORDER — NAPROXEN 250 MG PO TABS
500.0000 mg | ORAL_TABLET | Freq: Once | ORAL | Status: DC
  Filled 2020-09-24: qty 2
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
## 2020-09-24 NOTE — ED Notes (Signed)
Pt given a drink and snacks
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
## 2020-09-24 NOTE — Discharge Instructions (Addendum)
  Expect your soreness to increase over the next 2-3 days. Take it easy, but do not lay around too much as this may make any stiffness worse.  Antiinflammatory medications: Take 600 mg of ibuprofen every 6 hours or 440 mg (over the counter dose) to 500 mg (prescription dose) of naproxen every 12 hours for the next 3 days. After this time, these medications may be used as needed for pain. Take these medications with food to avoid upset stomach. Choose only one of these medications, do not take them together. Acetaminophen (generic for Tylenol): Should you continue to have additional pain while taking the ibuprofen or naproxen, you may add in acetaminophen as needed. Your daily total maximum amount of acetaminophen from all sources should be limited to 4000mg/day for persons without liver problems, or 2000mg/day for those with liver problems. Lidocaine patches: These are available via either prescription or over-the-counter. The over-the-counter option may be more economical one and are likely just as effective. There are multiple over-the-counter brands, such as Salonpas. Ice: May apply ice to the area over the next 24 hours for 15 minutes at a time to reduce pain, inflammation, and swelling, if present. Exercises: Be sure to perform the attached exercises starting with three times a week and working up to performing them daily. This is an essential part of preventing long term problems.  Follow up: Follow up with a primary care provider for any future management of these complaints. Be sure to follow up within 7-10 days. Return: Return to the ED should symptoms worsen.  For prescription assistance, may try using prescription discount sites or apps, such as goodrx.com 

## 2020-09-27 ENCOUNTER — Encounter: Payer: Self-pay | Admitting: Internal Medicine

## 2020-09-29 DIAGNOSIS — S134XXA Sprain of ligaments of cervical spine, initial encounter: Secondary | ICD-10-CM | POA: Diagnosis not present

## 2020-09-29 DIAGNOSIS — M47812 Spondylosis without myelopathy or radiculopathy, cervical region: Secondary | ICD-10-CM | POA: Diagnosis not present

## 2020-10-06 ENCOUNTER — Other Ambulatory Visit: Payer: Self-pay | Admitting: Internal Medicine

## 2020-10-06 DIAGNOSIS — Z1231 Encounter for screening mammogram for malignant neoplasm of breast: Secondary | ICD-10-CM

## 2020-10-22 ENCOUNTER — Other Ambulatory Visit: Payer: Self-pay | Admitting: Internal Medicine

## 2020-10-22 DIAGNOSIS — E785 Hyperlipidemia, unspecified: Secondary | ICD-10-CM

## 2020-10-24 ENCOUNTER — Other Ambulatory Visit (INDEPENDENT_AMBULATORY_CARE_PROVIDER_SITE_OTHER): Payer: Medicare Other

## 2020-10-24 ENCOUNTER — Other Ambulatory Visit: Payer: Self-pay

## 2020-10-24 ENCOUNTER — Other Ambulatory Visit: Payer: Self-pay | Admitting: Internal Medicine

## 2020-10-24 DIAGNOSIS — E785 Hyperlipidemia, unspecified: Secondary | ICD-10-CM

## 2020-10-24 LAB — VITAMIN D 25 HYDROXY (VIT D DEFICIENCY, FRACTURES): VITD: 39.87 ng/mL (ref 30.00–100.00)

## 2020-10-24 LAB — PHOSPHORUS: Phosphorus: 2.7 mg/dL (ref 2.3–4.6)

## 2020-10-26 ENCOUNTER — Other Ambulatory Visit: Payer: Self-pay | Admitting: Internal Medicine

## 2020-10-26 LAB — PTH, INTACT AND CALCIUM
Calcium: 10.6 mg/dL — ABNORMAL HIGH (ref 8.6–10.4)
PTH: 60 pg/mL (ref 16–77)

## 2020-11-09 ENCOUNTER — Telehealth: Payer: Self-pay | Admitting: Internal Medicine

## 2020-11-09 NOTE — Chronic Care Management (AMB) (Signed)
  Chronic Care Management   Note  11/09/2020 Name: Orlene Salmons MRN: [id number] DOB: January 20, 1956  Carletha Dawn is a 65 y.o. year old female who is a primary care patient of Etta Grandchild, MD. I reached out to [employer] by phone today in response to a referral sent by Ms. Mosetta Anis PCP, Etta Grandchild, MD.   [name] was given information about Chronic Care Management services today including:  CCM service includes personalized support from designated clinical staff supervised by her physician, including individualized plan of care and coordination with other care providers 24/7 contact phone numbers for assistance for urgent and routine care needs. Service will only be billed when office clinical staff spend 20 minutes or more in a month to coordinate care. Only one practitioner may furnish and bill the service in a calendar month. The patient may stop CCM services at any time (effective at the end of the month) by phone call to the office staff.   Patient agreed to services and verbal consent obtained.   Follow up plan:   Carmell [place] [employer] Scheduler

## 2020-11-09 NOTE — Progress Notes (Signed)
  Chronic Care Management   Outreach Note  11/09/2020 Name: [facility] MRN: [id number] DOB: 1955/12/02  Referred by: Etta Grandchild, MD Reason for referral : No chief complaint on file.   A second unsuccessful telephone outreach was attempted today. The patient was referred to pharmacist for assistance with care management and care coordination.  Follow Up Plan:   Carmell [place] [employer] Scheduler

## 2020-12-05 ENCOUNTER — Ambulatory Visit
Admission: RE | Admit: 2020-12-05 | Discharge: 2020-12-05 | Disposition: A | Discharge status: Home / Self Care | Payer: Medicare Other | Source: Ambulatory Visit | Attending: Internal Medicine | Admitting: Internal Medicine

## 2020-12-05 ENCOUNTER — Other Ambulatory Visit: Payer: Self-pay

## 2020-12-05 DIAGNOSIS — Z1231 Encounter for screening mammogram for malignant neoplasm of breast: Secondary | ICD-10-CM | POA: Diagnosis not present

## 2020-12-08 ENCOUNTER — Telehealth: Payer: Self-pay
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
## 2020-12-15 ENCOUNTER — Ambulatory Visit (INDEPENDENT_AMBULATORY_CARE_PROVIDER_SITE_OTHER): Payer: Medicare Other

## 2020-12-15 ENCOUNTER — Other Ambulatory Visit: Payer: Self-pay

## 2020-12-15 DIAGNOSIS — I10 Essential (primary) hypertension: Secondary | ICD-10-CM

## 2020-12-15 DIAGNOSIS — K219 Gastro-esophageal reflux disease without esophagitis: Secondary | ICD-10-CM

## 2020-12-15 DIAGNOSIS — N1831 Chronic kidney disease, stage 3a: Secondary | ICD-10-CM

## 2020-12-15 DIAGNOSIS — M85851 Other specified disorders of bone density and structure, right thigh: Secondary | ICD-10-CM

## 2020-12-15 DIAGNOSIS — R252 Cramp and spasm: Secondary | ICD-10-CM

## 2020-12-15 DIAGNOSIS — E785 Hyperlipidemia, unspecified: Secondary | ICD-10-CM

## 2020-12-15 DIAGNOSIS — M170 Bilateral primary osteoarthritis of knee: Secondary | ICD-10-CM | POA: Diagnosis not present

## 2020-12-15 DIAGNOSIS — J3089 Other allergic rhinitis: Secondary | ICD-10-CM
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
## 2020-12-19 ENCOUNTER — Other Ambulatory Visit: Payer: Self-pay | Admitting: Internal Medicine

## 2020-12-19 DIAGNOSIS — K219 Gastro-esophageal reflux disease without esophagitis: Secondary | ICD-10-CM

## 2020-12-19 DIAGNOSIS — R252 Cramp and spasm: Secondary | ICD-10-CM

## 2020-12-19 DIAGNOSIS — E785 Hyperlipidemia, unspecified: Secondary | ICD-10-CM

## 2020-12-19 MED ORDER — PRAVASTATIN SODIUM 40 MG PO TABS: 40.0000 mg | ORAL_TABLET | Freq: Every day | ORAL | 0 refills | Status: DC

## 2020-12-19 MED ORDER — PANTOPRAZOLE SODIUM 20 MG PO TBEC: 20.0000 mg | DELAYED_RELEASE_TABLET | Freq: Every day | ORAL | 1 refills | Status: DC

## 2020-12-19 MED ORDER — CYCLOBENZAPRINE HCL 10 MG PO TABS: 10.0000 mg | ORAL_TABLET | Freq: Three times a day (TID) | ORAL | 2 refills | Status: DC | PRN

## 2021-01-09 ENCOUNTER — Other Ambulatory Visit: Payer: Self-pay

## 2021-01-09 ENCOUNTER — Ambulatory Visit (INDEPENDENT_AMBULATORY_CARE_PROVIDER_SITE_OTHER): Payer: Medicare Other | Admitting: Internal Medicine

## 2021-01-09 ENCOUNTER — Encounter: Payer: Self-pay | Admitting: Internal Medicine

## 2021-01-09 VITALS — BP 138/88 | HR 76 | Temp 98.3°F | Resp 16 | Ht 64.0 in | Wt 213.0 lb

## 2021-01-09 DIAGNOSIS — K219 Gastro-esophageal reflux disease without esophagitis: Secondary | ICD-10-CM | POA: Diagnosis not present

## 2021-01-09 DIAGNOSIS — M170 Bilateral primary osteoarthritis of knee: Secondary | ICD-10-CM | POA: Diagnosis not present

## 2021-01-09 DIAGNOSIS — I10 Essential (primary) hypertension: Secondary | ICD-10-CM | POA: Diagnosis not present

## 2021-01-09 DIAGNOSIS — Z0001 Encounter for general adult medical examination with abnormal findings: Secondary | ICD-10-CM | POA: Diagnosis not present

## 2021-01-09 DIAGNOSIS — E785 Hyperlipidemia, unspecified: Secondary | ICD-10-CM | POA: Diagnosis not present

## 2021-01-09 DIAGNOSIS — R252 Cramp and spasm: Secondary | ICD-10-CM

## 2021-01-09 DIAGNOSIS — N1831 Chronic kidney disease, stage 3a: Secondary | ICD-10-CM | POA: Diagnosis not present

## 2021-01-09 LAB — HEPATIC FUNCTION PANEL
ALT: 13 U/L (ref 0–35)
AST: 19 U/L (ref 0–37)
Albumin: 4.1 g/dL (ref 3.5–5.2)
Alkaline Phosphatase: 104 U/L (ref 39–117)
Bilirubin, Direct: 0.1 mg/dL (ref 0.0–0.3)
Total Bilirubin: 0.5 mg/dL (ref 0.2–1.2)
Total Protein: 7.7 g/dL (ref 6.0–8.3)

## 2021-01-09 LAB — BASIC METABOLIC PANEL
BUN: 12 mg/dL (ref 6–23)
CO2: 26 mEq/L (ref 19–32)
Calcium: 10.5 mg/dL (ref 8.4–10.5)
Chloride: 107 mEq/L (ref 96–112)
Creatinine, Ser: 1.03 mg/dL (ref 0.40–1.20)
GFR: 56.28 mL/min — ABNORMAL LOW (ref >60.00)
Glucose, Bld: 88 mg/dL (ref 70–99)
Potassium: 4.2 mEq/L (ref 3.5–5.1)
Sodium: 142 mEq/L (ref 135–145)

## 2021-01-09 LAB — CK: Total CK: 74 U/L (ref 7–177)

## 2021-01-09 LAB — TSH: TSH: 1.14 u[IU]/mL (ref 0.35–5.50)

## 2021-01-09 LAB — VITAMIN D 25 HYDROXY (VIT D DEFICIENCY, FRACTURES): VITD: 40.42 ng/mL (ref 30.00–100.00)

## 2021-01-09 MED ORDER — PRAVASTATIN SODIUM 40 MG PO TABS: 40.0000 mg | ORAL_TABLET | Freq: Every day | ORAL | 1 refills | Status: DC

## 2021-01-09 MED ORDER — AMLODIPINE BESYLATE 10 MG PO TABS: 10.0000 mg | ORAL_TABLET | Freq: Every day | ORAL | 1 refills | Status: DC

## 2021-01-09 MED ORDER — CARVEDILOL 6.25 MG PO TABS: ORAL_TABLET | ORAL | 1 refills | Status: DC

## 2021-01-09 MED ORDER — NABUMETONE 500 MG PO TABS: 500.0000 mg | ORAL_TABLET | Freq: Two times a day (BID) | ORAL | 1 refills | Status: DC | PRN

## 2021-01-09 MED ORDER — PANTOPRAZOLE SODIUM 20 MG PO TBEC
20.0000 mg | DELAYED_RELEASE_TABLET | Freq: Every day | ORAL | 1 refills | Status: DC
Start: 2021-01-09 — End: 2021-06-11

## 2021-01-09 MED ORDER — CYCLOBENZAPRINE HCL 10 MG PO TABS
10.0000 mg | ORAL_TABLET | Freq: Three times a day (TID) | ORAL | 2 refills | Status: DC | PRN
Start: 2021-01-09 — End: 2022-02-18

## 2021-01-09 NOTE — Patient Instructions (Signed)
Health Maintenance, Female Adopting a healthy lifestyle and getting preventive care are important in promoting health and wellness. Ask your health care provider about: The right schedule for you to have regular tests and exams. Things you can do on your own to prevent diseases and keep yourself healthy. What should I know about diet, weight, and exercise? Eat a healthy diet  Eat a diet that includes plenty of vegetables, fruits, low-fat dairy products, and lean protein. Do not eat a lot of foods that are high in solid fats, added sugars, or sodium.  Maintain a healthy weight Body mass index (BMI) is used to identify weight problems. It estimates body fat based on height and weight. Your health care provider can help determineyour BMI and help you achieve or maintain a healthy weight. Get regular exercise Get regular exercise. This is one of the most important things you can do for your health. Most adults should: Exercise for at least 150 minutes each week. The exercise should increase your heart rate and make you sweat (moderate-intensity exercise). Do strengthening exercises at least twice a week. This is in addition to the moderate-intensity exercise. Spend less time sitting. Even light physical activity can be beneficial. Watch cholesterol and blood lipids Have your blood tested for lipids and cholesterol at 67 years of age, then havethis test every 5 years. Have your cholesterol levels checked more often if: Your lipid or cholesterol levels are high. You are older than 67 years of age. You are at high risk for heart disease. What should I know about cancer screening? Depending on your health history and family history, you may need to have cancer screening at various ages. This may include screening for: Breast cancer. Cervical cancer. Colorectal cancer. Skin cancer. Lung cancer. What should I know about heart disease, diabetes, and high blood pressure? Blood pressure and heart  disease High blood pressure causes heart disease and increases the risk of stroke. This is more likely to develop in people who have high blood pressure readings, are of African descent, or are overweight. Have your blood pressure checked: Every 3-5 years if you are 18-39 years of age. Every year if you are 40 years old or older. Diabetes Have regular diabetes screenings. This checks your fasting blood sugar level. Have the screening done: Once every three years after age 40 if you are at a normal weight and have a low risk for diabetes. More often and at a younger age if you are overweight or have a high risk for diabetes. What should I know about preventing infection? Hepatitis B If you have a higher risk for hepatitis B, you should be screened for this virus. Talk with your health care provider to find out if you are at risk forhepatitis B infection. Hepatitis C Testing is recommended for: Everyone born from 1945 through 1965. Anyone with known risk factors for hepatitis C. Sexually transmitted infections (STIs) Get screened for STIs, including gonorrhea and chlamydia, if: You are sexually active and are younger than 67 years of age. You are older than 67 years of age and your health care provider tells you that you are at risk for this type of infection. Your sexual activity has changed since you were last screened, and you are at increased risk for chlamydia or gonorrhea. Ask your health care provider if you are at risk. Ask your health care provider about whether you are at high risk for HIV. Your health care provider may recommend a prescription medicine to help   prevent HIV infection. If you choose to take medicine to prevent HIV, you should first get tested for HIV. You should then be tested every 3 months for as long as you are taking the medicine. Pregnancy If you are about to stop having your period (premenopausal) and you may become pregnant, seek counseling before you get  pregnant. Take 400 to 800 micrograms (mcg) of folic acid every day if you become pregnant. Ask for birth control (contraception) if you want to prevent pregnancy. Osteoporosis and menopause Osteoporosis is a disease in which the bones lose minerals and strength with aging. This can result in bone fractures. If you are 65 years old or older, or if you are at risk for osteoporosis and fractures, ask your health care provider if you should: Be screened for bone loss. Take a calcium or vitamin D supplement to lower your risk of fractures. Be given hormone replacement therapy (HRT) to treat symptoms of menopause. Follow these instructions at home: Lifestyle Do not use any products that contain nicotine or tobacco, such as cigarettes, e-cigarettes, and chewing tobacco. If you need help quitting, ask your health care provider. Do not use street drugs. Do not share needles. Ask your health care provider for help if you need support or information about quitting drugs. Alcohol use Do not drink alcohol if: Your health care provider tells you not to drink. You are pregnant, may be pregnant, or are planning to become pregnant. If you drink alcohol: Limit how much you use to 0-1 drink a day. Limit intake if you are breastfeeding. Be aware of how much alcohol is in your drink. In the [place] one drink equals one 12 oz bottle of beer (355 mL), one 5 oz glass of wine (148 mL), or one 1 oz glass of hard liquor (44 mL). General instructions Schedule regular health, dental, and eye exams. Stay current with your vaccines. Tell your health care provider if: You often feel depressed. You have ever been abused or do not feel safe at home. Summary Adopting a healthy lifestyle and getting preventive care are important in promoting health and wellness. Follow your health care provider's instructions about healthy diet, exercising, and getting tested or screened for diseases. Follow your health care provider's  instructions on monitoring your cholesterol and blood pressure. This information is not intended to replace advice given to you by your health care provider. Make sure you discuss any questions you have with your healthcare provider. Document Revised: 04/23/2018 Document Reviewed: 04/23/2018 Elsevier Patient Education  2022 Elsevier Inc.  

## 2021-01-09 NOTE — Progress Notes (Signed)
Subjective:  Patient ID: Kayla Lowe, female    DOB: 06-15-1955  Age: 65 y.o. MRN: [id number]  CC: Annual Exam, Hypertension, and Hyperlipidemia  This visit occurred during the SARS-CoV-2 public health emergency.  Safety protocols were in place, including screening questions prior to the visit, additional usage of staff PPE, and extensive cleaning of exam room while observing appropriate contact time as indicated for disinfecting solutions.    HPI [name] presents for a CPX and f/up -   She complains of nocturnal leg cramps and pain in her large joints.  She is active and denies any recent episodes of chest pain, shortness of breath, diaphoresis, dizziness, lightheadedness, or edema.  Outpatient Medications Prior to Visit  Medication Sig Dispense Refill   aspirin EC 81 MG tablet Take 81 mg by mouth daily. Swallow whole.     cetirizine (ZYRTEC) 10 MG tablet Take 10 mg by mouth daily.     Cholecalciferol (VITAMIN D) 50 MCG (2000 UT) CAPS Take 1 capsule by mouth daily.     ezetimibe (ZETIA) 10 MG tablet Take 1 tablet (10 mg total) by mouth daily. 90 tablet 1   fluticasone (FLONASE) 50 MCG/ACT nasal spray PLACE 2 SPRAYS INTO BOTH NOSTRILS DAILY. 16 g 2   montelukast (SINGULAIR) 10 MG tablet TAKE 1 TABLET(10 MG) BY MOUTH AT BEDTIME 90 tablet 1   amLODipine (NORVASC) 10 MG tablet Take 1 tablet (10 mg total) by mouth daily. 90 tablet 1   carvedilol (COREG) 6.25 MG tablet TAKE 1 TABLET(6.25 MG) BY MOUTH TWICE DAILY WITH A MEAL 180 tablet 1   cyclobenzaprine (FLEXERIL) 10 MG tablet Take 1 tablet (10 mg total) by mouth 3 (three) times daily as needed for muscle spasms. 30 tablet 2   diphenhydrAMINE-zinc acetate (BENADRYL) cream Apply topically daily as needed for itching.     nabumetone (RELAFEN) 500 MG tablet Take 1 tablet (500 mg total) by mouth 2 (two) times daily as needed. 180 tablet 1   pantoprazole (PROTONIX) 20 MG tablet Take 1 tablet (20 mg total) by  mouth daily. 90 tablet 1   pravastatin (PRAVACHOL) 40 MG tablet Take 1 tablet (40 mg total) by mouth daily. 90 tablet 0   No facility-administered medications prior to visit.    ROS Review of Systems  Constitutional:  Negative for diaphoresis, fatigue and unexpected weight change.  HENT:  Negative for trouble swallowing.   Eyes: Negative.   Respiratory:  Negative for cough, chest tightness, shortness of breath and wheezing.   Cardiovascular:  Negative for chest pain, palpitations and leg swelling.  Gastrointestinal:  Negative for abdominal pain, constipation, diarrhea and vomiting.  Endocrine: Negative.   Genitourinary: Negative.  Negative for difficulty urinating, dysuria and hematuria.  Musculoskeletal:  Positive for arthralgias. Negative for myalgias.  Skin: Negative.  Negative for color change and pallor.  Neurological: Negative.  Negative for dizziness, weakness, light-headedness and headaches.  Hematological:  Negative for adenopathy. Does not bruise/bleed easily.  Psychiatric/Behavioral: Negative.     Objective:  BP 138/88 (BP Location: Left Arm, Patient Position: Sitting, Cuff Size: Large)   Pulse 76   Temp 98.3 F (36.8 C) (Oral)   Resp 16   Ht 5\' 4"  (1.626 m)   Wt 213 lb (96.6 kg)   SpO2 99%   BMI 36.56 kg/m   BP Readings from Last 3 Encounters:  01/09/21 138/88  09/24/20 (!) 125/111  07/11/20 (!) 126/92    Wt Readings from Last 3 Encounters:  01/09/21 213 lb (96.6  kg)  09/24/20 212 lb 1.3 oz (96.2 kg)  07/11/20 212 lb (96.2 kg)    Physical Exam Vitals reviewed.  Constitutional:      Appearance: Normal appearance.  HENT:     Nose: Nose normal.     Mouth/Throat:     Mouth: Mucous membranes are moist.  Eyes:     Conjunctiva/sclera: Conjunctivae normal.  Cardiovascular:     Rate and Rhythm: Normal rate and regular rhythm.     Heart sounds: No murmur heard. Pulmonary:     Effort: Pulmonary effort is normal.     Breath sounds: No stridor. No wheezing,  rhonchi or rales.  Abdominal:     General: Abdomen is flat.     Palpations: There is no mass.     Tenderness: There is no abdominal tenderness. There is no guarding.     Hernia: No hernia is present.  Musculoskeletal:        General: No tenderness. Normal range of motion.     Cervical back: Neck supple.     Right lower leg: No edema.     Left lower leg: No edema.  Lymphadenopathy:     Cervical: No cervical adenopathy.  Skin:    General: Skin is warm and dry.     Coloration: Skin is not pale.  Neurological:     General: No focal deficit present.     Mental Status: She is alert.  Psychiatric:        Mood and Affect: Mood normal.        Behavior: Behavior normal.    Lab Results  Component Value Date   WBC 6.6 09/16/2019   HGB 12.7 09/16/2019   HCT 37.8 09/16/2019   PLT 326.0 09/16/2019   GLUCOSE 88 01/09/2021   CHOL 260 (H) 07/11/2020   TRIG 135.0 07/11/2020   HDL 62.40 07/11/2020   LDLDIRECT 220.0 09/16/2019   LDLCALC 170 (H) 07/11/2020   ALT 13 01/09/2021   AST 19 01/09/2021   NA 142 01/09/2021   K 4.2 01/09/2021   CL 107 01/09/2021   CREATININE 1.03 01/09/2021   BUN 12 01/09/2021   CO2 26 01/09/2021   TSH 1.14 01/09/2021   INR 0.97 07/10/2013   HGBA1C 5.4 08/31/2019    MM 3D SCREEN BREAST BILATERAL  Result Date: 12/06/2020 CLINICAL DATA:  Screening. EXAM: DIGITAL SCREENING BILATERAL MAMMOGRAM WITH TOMOSYNTHESIS AND CAD TECHNIQUE: Bilateral screening digital craniocaudal and mediolateral oblique mammograms were obtained. Bilateral screening digital breast tomosynthesis was performed. The images were evaluated with computer-aided detection. COMPARISON:  Previous exam(s). ACR Breast Density Category b: There are scattered areas of fibroglandular density. FINDINGS: There are no findings suspicious for malignancy. IMPRESSION: No mammographic evidence of malignancy. A result letter of this screening mammogram will be mailed directly to the patient. RECOMMENDATION:  Screening mammogram in one year. (Code:SM-B-01Y) BI-RADS CATEGORY  1: Negative. Electronically Signed   By: Amie Portland M.D.   On: 12/06/2020 14:33    Assessment & Plan:   [name] was seen today for annual exam, hypertension and hyperlipidemia.  Diagnoses and all orders for this visit:  Encounter for general adult medical examination with abnormal findings- Exam completed, labs reviewed, vaccines reviewed and updated, cancer screenings are up-to-date, patient education was given.  Essential hypertension- Her BP is well controlled. -     amLODipine (NORVASC) 10 MG tablet; Take 1 tablet (10 mg total) by mouth daily. -     carvedilol (COREG) 6.25 MG tablet; TAKE 1 TABLET(6.25 MG) BY  MOUTH TWICE DAILY WITH A MEAL -     Basic metabolic panel; Future -     TSH; Future -     TSH -     Basic metabolic panel  Nocturnal muscle cramps- Her CK is normal. -     cyclobenzaprine (FLEXERIL) 10 MG tablet; Take 1 tablet (10 mg total) by mouth 3 (three) times daily as needed for muscle spasms. -     Hepatic function panel; Future -     CK; Future -     CK -     Hepatic function panel  Gastroesophageal reflux disease without esophagitis- Her symptoms are well controlled with the PPI.   -     pantoprazole (PROTONIX) 20 MG tablet; Take 1 tablet (20 mg total) by mouth daily.  Dyslipidemia, goal LDL below 130- LDL goal achieved. Doing well on the statin  -     pravastatin (PRAVACHOL) 40 MG tablet; Take 1 tablet (40 mg total) by mouth daily. -     TSH; Future -     TSH  Primary osteoarthritis of both knees -     nabumetone (RELAFEN) 500 MG tablet; Take 1 tablet (500 mg total) by mouth 2 (two) times daily as needed.  Stage 3a chronic kidney disease (HCC)- Her renal function is stable.  Hypercalcemia- Her calcium level is in the normal range.  I will screen her for hyperparathyroidism. -     Basic metabolic panel; Future -     VITAMIN D 25 Hydroxy (Vit-D Deficiency, Fractures); Future -     PTH,  intact and calcium; Future -     PTH, intact and calcium -     VITAMIN D 25 Hydroxy (Vit-D Deficiency, Fractures) -     Basic metabolic panel  I have discontinued Elease Hashimoto [place]'s diphenhydrAMINE-zinc acetate. I am also having her maintain her fluticasone, ezetimibe, montelukast, aspirin EC, cetirizine, Vitamin D, amLODipine, cyclobenzaprine, carvedilol, pantoprazole, pravastatin, and nabumetone.  Meds ordered this encounter  Medications   amLODipine (NORVASC) 10 MG tablet    Sig: Take 1 tablet (10 mg total) by mouth daily.    Dispense:  90 tablet    Refill:  1   cyclobenzaprine (FLEXERIL) 10 MG tablet    Sig: Take 1 tablet (10 mg total) by mouth 3 (three) times daily as needed for muscle spasms.    Dispense:  30 tablet    Refill:  2   carvedilol (COREG) 6.25 MG tablet    Sig: TAKE 1 TABLET(6.25 MG) BY MOUTH TWICE DAILY WITH A MEAL    Dispense:  180 tablet    Refill:  1   pantoprazole (PROTONIX) 20 MG tablet    Sig: Take 1 tablet (20 mg total) by mouth daily.    Dispense:  90 tablet    Refill:  1   pravastatin (PRAVACHOL) 40 MG tablet    Sig: Take 1 tablet (40 mg total) by mouth daily.    Dispense:  90 tablet    Refill:  1   nabumetone (RELAFEN) 500 MG tablet    Sig: Take 1 tablet (500 mg total) by mouth 2 (two) times daily as needed.    Dispense:  180 tablet    Refill:  1     Follow-up: Return in about 6 months (around 07/11/2021).  Sanda Linger, MD

## 2021-01-11 LAB — PTH, INTACT AND CALCIUM
Calcium: 10.4 mg/dL (ref 8.6–10.4)
PTH: 60 pg/mL (ref 16–77)

## 2021-01-12 DIAGNOSIS — H52223 Regular astigmatism, bilateral: Secondary | ICD-10-CM | POA: Diagnosis not present

## 2021-01-12 DIAGNOSIS — H2513 Age-related nuclear cataract, bilateral: Secondary | ICD-10-CM | POA: Diagnosis not present

## 2021-01-12 DIAGNOSIS — H524 Presbyopia: Secondary | ICD-10-CM | POA: Diagnosis not present

## 2021-01-12 DIAGNOSIS — H47292 Other optic atrophy, left eye: Secondary | ICD-10-CM | POA: Diagnosis not present

## 2021-01-12 DIAGNOSIS — H35033 Hypertensive retinopathy, bilateral: Secondary | ICD-10-CM | POA: Diagnosis not present

## 2021-01-23 DIAGNOSIS — H25011 Cortical age-related cataract, right eye: Secondary | ICD-10-CM | POA: Diagnosis not present

## 2021-01-23 DIAGNOSIS — Z01818 Encounter for other preprocedural examination: Secondary | ICD-10-CM | POA: Diagnosis not present

## 2021-01-23 DIAGNOSIS — H25812 Combined forms of age-related cataract, left eye: Secondary | ICD-10-CM | POA: Diagnosis not present

## 2021-01-31 ENCOUNTER — Other Ambulatory Visit: Payer: Self-pay | Admitting: Ophthalmology

## 2021-01-31 ENCOUNTER — Other Ambulatory Visit: Payer: Self-pay

## 2021-01-31 ENCOUNTER — Ambulatory Visit (INDEPENDENT_AMBULATORY_CARE_PROVIDER_SITE_OTHER): Payer: Medicare Other | Admitting: Endocrinology

## 2021-01-31 ENCOUNTER — Encounter: Payer: Self-pay | Admitting: Endocrinology

## 2021-01-31 DIAGNOSIS — H468 Other optic neuritis: Secondary | ICD-10-CM

## 2021-01-31 DIAGNOSIS — H469 Unspecified optic neuritis: Secondary | ICD-10-CM

## 2021-01-31 NOTE — Patient Instructions (Signed)
Blood tests are requested for you today.  We'll let you know about the results.    Please come back for a follow-up appointment in 4 months.   

## 2021-01-31 NOTE — Progress Notes (Signed)
Subjective:    Patient ID: Kayla Lowe, female    DOB: May 24, 1955, 65 y.o.   MRN: [id number]  HPI Pt is referred by Dr Yetta Barre, for hypercalcemia.  Pt was noted to have hypercalcemia in 2022.  she has never had osteoporosis, urolithiasis, thyroid probs, parathyroid probs, sarcoidosis, cancer, PUD, pancreatitis, or bony fracture.  she does not take a dedicated vitamin-A supplement.  Pt denies taking antacids, Li++, or HCTZ.  She reports muscle cramps and chronic low back pain.   Past Medical History:  Diagnosis Date   GERD (gastroesophageal reflux disease)    Hyperlipidemia    Hypertension    Osteoarthritis     Past Surgical History:  Procedure Laterality Date   ABDOMINAL HYSTERECTOMY  1987    Social History   Socioeconomic History   Marital status: Legally Separated    Spouse name: Not on file   Number of children: Not on file   Years of education: Not on file   Highest education level: Not on file  Occupational History   Not on file  Tobacco Use   Smoking status: Never   Smokeless tobacco: Never  Substance and Sexual Activity   Alcohol use: No   Drug use: No   Sexual activity: Not Currently  Other Topics Concern   Not on file  Social History Narrative   Not on file   Social Determinants of Health   Financial Resource Strain: Low Risk    Difficulty of Paying Living Expenses: Not hard at all  Food Insecurity: No Food Insecurity   Worried About Radiation protection practitioner of Food in the Last Year: Never true   Ran Out of Food in the Last Year: Never true  Transportation Needs: No Transportation Needs   Lack of Transportation (Medical): No   Lack of Transportation (Non-Medical): No  Physical Activity: Sufficiently Active   Days of Exercise per Week: 5 days   Minutes of Exercise per Session: 60 min  Stress: No Stress Concern Present   Feeling of Stress : Not at all  Social Connections: Socially Integrated   Frequency of Communication with Friends and Family: More  than three times a week   Frequency of Social Gatherings with Friends and Family: More than three times a week   Attends Religious Services: More than 4 times per year   Active Member of [employer] or Organizations: No   Attends Engineer, structural: More than 4 times per year   Marital Status: Living with partner  Intimate Partner Violence: Not At Risk   Fear of Current or Ex-Partner: No   Emotionally Abused: No   Physically Abused: No   Sexually Abused: No    Current Outpatient Medications on File Prior to Visit  Medication Sig Dispense Refill   amLODipine (NORVASC) 10 MG tablet Take 1 tablet (10 mg total) by mouth daily. 90 tablet 1   aspirin EC 81 MG tablet Take 81 mg by mouth daily. Swallow whole.     carvedilol (COREG) 6.25 MG tablet TAKE 1 TABLET(6.25 MG) BY MOUTH TWICE DAILY WITH A MEAL 180 tablet 1   cetirizine (ZYRTEC) 10 MG tablet Take 10 mg by mouth daily.     Cholecalciferol (VITAMIN D) 50 MCG (2000 UT) CAPS Take 1 capsule by mouth daily.     cyclobenzaprine (FLEXERIL) 10 MG tablet Take 1 tablet (10 mg total) by mouth 3 (three) times daily as needed for muscle spasms. 30 tablet 2   ezetimibe (ZETIA) 10 MG tablet Take 1 tablet (  10 mg total) by mouth daily. 90 tablet 1   fluticasone (FLONASE) 50 MCG/ACT nasal spray PLACE 2 SPRAYS INTO BOTH NOSTRILS DAILY. 16 g 2   montelukast (SINGULAIR) 10 MG tablet TAKE 1 TABLET(10 MG) BY MOUTH AT BEDTIME 90 tablet 1   nabumetone (RELAFEN) 500 MG tablet Take 1 tablet (500 mg total) by mouth 2 (two) times daily as needed. 180 tablet 1   pantoprazole (PROTONIX) 20 MG tablet Take 1 tablet (20 mg total) by mouth daily. 90 tablet 1   pravastatin (PRAVACHOL) 40 MG tablet Take 1 tablet (40 mg total) by mouth daily. 90 tablet 1   No current facility-administered medications on file prior to visit.    Allergies  Allergen Reactions   Cozaar [Losartan Potassium]     Caused cough and sweats. Tingling in legs.   Lisinopril Cough   Statins  Other (See Comments)    myalgias    Family History  Problem Relation Age of Onset   Cancer Father    Hypertension Brother    Diabetes Brother    Diabetes Paternal Grandfather    Diabetes Paternal Uncle    Colon cancer Neg Hx    Hypercalcemia Neg Hx     BP 140/70 (BP Location: Right Arm, Patient Position: Sitting, Cuff Size: Large)   Pulse 86   Ht 5\' 4"  (1.626 m)   Wt 213 lb (96.6 kg)   SpO2 98%   BMI 36.56 kg/m     Review of Systems Denies weight loss, polyuria, and depression.     Objective:   Physical Exam VS: see vs page GEN: no distress HEAD: head: no deformity eyes: no periorbital swelling, no proptosis external nose and ears are normal NECK: supple, thyroid is not enlarged CHEST WALL: no kyphosis.   LUNGS: clear to auscultation CV: reg rate and rhythm, no murmur.  MUSCULOSKELETAL: gait is normal and steady EXTEMITIES: no deformity.  Trace bilat leg edema NEURO:  readily moves all 4's.  sensation is intact to touch on all 4's SKIN:  Normal texture and temperature.  No rash or suspicious lesion is visible.   NODES:  None palpable at the neck PSYCH: alert, well-oriented.  Does not appear anxious nor depressed.  Lab Results  Component Value Date   PTH 60 01/09/2021   CALCIUM 10.4 01/09/2021   CALCIUM 10.5 01/09/2021   CAION 1.24 06/08/2010   PHOS 2.7 10/24/2020   25-OH Vit-D=40  DEXA (2020) normal.    Lab Results  Component Value Date   ALT 13 01/09/2021   AST 19 01/09/2021   ALKPHOS 104 01/09/2021   BILITOT 0.5 01/09/2021   Lab Results  Component Value Date   TSH 1.14 01/09/2021   Lab Results  Component Value Date   PTH 60 01/09/2021   CALCIUM 10.4 01/09/2021   CALCIUM 10.5 01/09/2021   CAION 1.24 06/08/2010   PHOS 2.7 10/24/2020   I have reviewed outside records, and summarized: Pt was noted to have elevated Ca++, and referred here.  Arthralgias, dyslipidemia, and CRI were also addressed.     Assessment & Plan:  Hypercalcemia, new  to me, uncertain etiology and prognosis.  No medication is needed.   Patient Instructions  Blood tests are requested for you today.  We'll let you know about the results.  Please come back for a follow-up appointment in 4 months

## 2021-02-10 LAB — VITAMIN D 1,25 DIHYDROXY
Vitamin D 1, 25 (OH)2 Total: 82 pg/mL — ABNORMAL HIGH (ref 18–72)
Vitamin D2 1, 25 (OH)2: <8 pg/mL
Vitamin D3 1, 25 (OH)2: 82 pg/mL

## 2021-02-10 LAB — PTH-RELATED PEPTIDE: PTH-Related Protein (PTH-RP): 10 pg/mL — ABNORMAL LOW (ref 11–20)

## 2021-02-10 LAB — VITAMIN A: Vitamin A (Retinoic Acid): 60 ug/dL (ref 38–98)

## 2021-02-11 ENCOUNTER — Other Ambulatory Visit: Payer: Self-pay | Admitting: Endocrinology

## 2021-02-16 ENCOUNTER — Telehealth: Payer: Medicare Other

## 2021-02-19 ENCOUNTER — Other Ambulatory Visit: Payer: Self-pay

## 2021-02-19 ENCOUNTER — Ambulatory Visit
Admission: RE | Admit: 2021-02-19 | Discharge: 2021-02-19 | Disposition: A | Discharge status: Home / Self Care | Payer: Medicare Other | Source: Ambulatory Visit | Attending: Ophthalmology | Admitting: Ophthalmology

## 2021-02-19 DIAGNOSIS — H468 Other optic neuritis: Secondary | ICD-10-CM

## 2021-02-19 DIAGNOSIS — H469 Unspecified optic neuritis: Secondary | ICD-10-CM

## 2021-02-19 MED ORDER — GADOBENATE DIMEGLUMINE 529 MG/ML IV SOLN
19.0000 mL | Freq: Once | INTRAVENOUS | Status: AC | PRN
  Administered 2021-02-19: 19 mL via INTRAVENOUS

## 2021-03-03 ENCOUNTER — Other Ambulatory Visit: Payer: Self-pay | Admitting: Internal Medicine

## 2021-03-03 DIAGNOSIS — E785 Hyperlipidemia, unspecified: Secondary | ICD-10-CM

## 2021-03-07 ENCOUNTER — Telehealth: Payer: Medicare Other
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
## 2021-03-09 DIAGNOSIS — H25812 Combined forms of age-related cataract, left eye: Secondary | ICD-10-CM | POA: Diagnosis not present

## 2021-04-26 ENCOUNTER — Other Ambulatory Visit (HOSPITAL_BASED_OUTPATIENT_CLINIC_OR_DEPARTMENT_OTHER): Payer: Self-pay

## 2021-05-11 ENCOUNTER — Ambulatory Visit: Payer: Medicare Other | Attending: Internal Medicine

## 2021-05-11 ENCOUNTER — Other Ambulatory Visit (HOSPITAL_BASED_OUTPATIENT_CLINIC_OR_DEPARTMENT_OTHER): Payer: Self-pay

## 2021-05-11 DIAGNOSIS — Z23 Encounter for immunization: Secondary | ICD-10-CM

## 2021-05-11 MED ORDER — PFIZER COVID-19 VAC BIVALENT 30 MCG/0.3ML IM SUSP
INTRAMUSCULAR | 0 refills | Status: DC
  Filled 2021-05-11: qty 0.3, 1d supply, fill #0

## 2021-05-11 MED ORDER — INFLUENZA VAC A&B SA ADJ QUAD 0.5 ML IM PRSY
PREFILLED_SYRINGE | INTRAMUSCULAR | 0 refills | Status: DC
  Filled 2021-05-11: qty 0.5, 1d supply, fill #0

## 2021-05-11 NOTE — Progress Notes (Signed)
° °  Covid-19 Vaccination Clinic  Name:  Kayla Lowe    MRN: [id number] DOB: August 01, 1955  05/11/2021  Kayla Lowe was observed post Covid-19 immunization for 15 minutes without incident. She was provided with Vaccine Information Sheet and instruction to access the V-Safe system.   Kayla Lowe was instructed to call 911 with any severe reactions post vaccine: Difficulty breathing  Swelling of face and throat  A fast heartbeat  A bad rash all over body  Dizziness and weakness   Immunizations Administered     Name Date Dose VIS Date Route   Pfizer Covid-19 Vaccine Bivalent Booster 05/11/2021 11:32 AM 0.3 mL 01/11/2021 Intramuscular   Manufacturer: [employer], [employer]   Lot: [id number]   NDC: [phone]

## 2021-05-12 ENCOUNTER — Other Ambulatory Visit (HOSPITAL_BASED_OUTPATIENT_CLINIC_OR_DEPARTMENT_OTHER): Payer: Self-pay

## 2021-06-06 ENCOUNTER — Ambulatory Visit (INDEPENDENT_AMBULATORY_CARE_PROVIDER_SITE_OTHER): Payer: Medicare Other | Admitting: Endocrinology

## 2021-06-06 ENCOUNTER — Other Ambulatory Visit: Payer: Self-pay

## 2021-06-06 ENCOUNTER — Encounter: Payer: Self-pay | Admitting: Endocrinology

## 2021-06-06 NOTE — Patient Instructions (Addendum)
Blood tests are requested for you today.  We'll let you know about the results.   If these tests are normal, the next step is to check a 24-HR urine collection for calcium.

## 2021-06-06 NOTE — Progress Notes (Signed)
Subjective:    Patient ID: Kayla Lowe, female    DOB: May 19, 1955, 66 y.o.   MRN: [id number]  HPI Pt returns for f/u of hypercalcemia (dx'ed 2022; she has never had osteoporosis, urolithiasis, thyroid probs, parathyroid probs, or bony fracture; no smoking x 40 years; labs were normal except for high 1,25-Vit-D).  pt states she feels well in general.  Denies muscle weakness.   Past Medical History:  Diagnosis Date   GERD (gastroesophageal reflux disease)    Hyperlipidemia    Hypertension    Osteoarthritis     Past Surgical History:  Procedure Laterality Date   ABDOMINAL HYSTERECTOMY  1987    Social History   Socioeconomic History   Marital status: Legally Separated    Spouse name: Not on file   Number of children: Not on file   Years of education: Not on file   Highest education level: Not on file  Occupational History   Not on file  Tobacco Use   Smoking status: Never   Smokeless tobacco: Never  Substance and Sexual Activity   Alcohol use: No   Drug use: No   Sexual activity: Not Currently  Other Topics Concern   Not on file  Social History Narrative   Not on file   Social Determinants of Health   Financial Resource Strain: Not on file  Food Insecurity: Not on file  Transportation Needs: Not on file  Physical Activity: Not on file  Stress: Not on file  Social Connections: Not on file  Intimate Partner Violence: Not on file    Current Outpatient Medications on File Prior to Visit  Medication Sig Dispense Refill   amLODipine (NORVASC) 10 MG tablet Take 1 tablet (10 mg total) by mouth daily. 90 tablet 1   aspirin EC 81 MG tablet Take 81 mg by mouth daily. Swallow whole.     carvedilol (COREG) 6.25 MG tablet TAKE 1 TABLET(6.25 MG) BY MOUTH TWICE DAILY WITH A MEAL 180 tablet 1   cetirizine (ZYRTEC) 10 MG tablet Take 10 mg by mouth daily.     Cholecalciferol (VITAMIN D) 50 MCG (2000 UT) CAPS Take 1 capsule by mouth daily.     COVID-19 mRNA  bivalent vaccine, Pfizer, (PFIZER COVID-19 VAC BIVALENT) injection Inject into the muscle. 0.3 mL 0   cyclobenzaprine (FLEXERIL) 10 MG tablet Take 1 tablet (10 mg total) by mouth 3 (three) times daily as needed for muscle spasms. 30 tablet 2   ezetimibe (ZETIA) 10 MG tablet TAKE 1 TABLET(10 MG) BY MOUTH DAILY 90 tablet 1   fluticasone (FLONASE) 50 MCG/ACT nasal spray PLACE 2 SPRAYS INTO BOTH NOSTRILS DAILY. 16 g 2   influenza vaccine adjuvanted (FLUAD) 0.5 ML injection Inject into the muscle. 0.5 mL 0   montelukast (SINGULAIR) 10 MG tablet TAKE 1 TABLET(10 MG) BY MOUTH AT BEDTIME 90 tablet 1   nabumetone (RELAFEN) 500 MG tablet Take 1 tablet (500 mg total) by mouth 2 (two) times daily as needed. 180 tablet 1   pantoprazole (PROTONIX) 20 MG tablet Take 1 tablet (20 mg total) by mouth daily. 90 tablet 1   pravastatin (PRAVACHOL) 40 MG tablet Take 1 tablet (40 mg total) by mouth daily. 90 tablet 1   No current facility-administered medications on file prior to visit.    Allergies  Allergen Reactions   Cozaar [Losartan Potassium]     Caused cough and sweats. Tingling in legs.   Lisinopril Cough   Statins Other (See Comments)    myalgias  Family History  Problem Relation Age of Onset   Cancer Father    Hypertension Brother    Diabetes Brother    Diabetes Paternal Grandfather    Diabetes Paternal Uncle    Colon cancer Neg Hx    Hypercalcemia Neg Hx     BP 126/84 (BP Location: Left Arm, Patient Position: Sitting, Cuff Size: Normal)    Pulse 66    Ht 5\' 4"  (1.626 m)    Wt 216 lb 12.8 oz (98.3 kg)    SpO2 100%    BMI 37.21 kg/m   Review of Systems     Objective:   Physical Exam VITAL SIGNS:  See vs page. GENERAL: no distress.   GAIT: normal and steady.    Lab Results  Component Value Date   PTH 65 06/06/2021   CALCIUM 10.8 (H) 06/06/2021   CAION 5.73 (H) 06/06/2021   PHOS 2.7 10/24/2020       Assessment & Plan:  Hypercalcemia: uncontrolled.  uncertain etiology and  prognosis HTN: well-controlled.  Pt should continue to avoid hypoglycemia.   Patient Instructions  Blood tests are requested for you today.  We'll let you know about the results.   If these tests are normal, the next step is to check a 24-HR urine collection for calcium.

## 2021-06-09 ENCOUNTER — Other Ambulatory Visit: Payer: Self-pay

## 2021-06-09 ENCOUNTER — Ambulatory Visit: Payer: Medicare Other

## 2021-06-09 ENCOUNTER — Telehealth: Payer: Self-pay

## 2021-06-09 LAB — VITAMIN D 1,25 DIHYDROXY
Vitamin D 1, 25 (OH)2 Total: 64 pg/mL (ref 18–72)
Vitamin D2 1, 25 (OH)2: <8 pg/mL
Vitamin D3 1, 25 (OH)2: 64 pg/mL

## 2021-06-09 LAB — CALCIUM, IONIZED: Calcium, Ion: 5.73 mg/dL — ABNORMAL HIGH (ref 4.8–5.6)

## 2021-06-09 LAB — ANGIOTENSIN CONVERTING ENZYME: Angiotensin-Converting Enzyme: 36 U/L (ref 9–67)

## 2021-06-09 LAB — PROTEIN ELECTROPHORESIS, SERUM
Albumin ELP: 4 g/dL (ref 3.8–4.8)
Alpha 1: 0.3 g/dL (ref 0.2–0.3)
Alpha 2: 0.7 g/dL (ref 0.5–0.9)
Beta 2: 0.5 g/dL (ref 0.2–0.5)
Beta Globulin: 0.5 g/dL (ref 0.4–0.6)
Gamma Globulin: 1.3 g/dL (ref 0.8–1.7)
Total Protein: 7.3 g/dL (ref 6.1–8.1)

## 2021-06-09 LAB — PTH, INTACT AND CALCIUM
Calcium: 10.8 mg/dL — ABNORMAL HIGH (ref 8.6–10.4)
PTH: 65 pg/mL (ref 16–77)

## 2021-06-09 NOTE — Telephone Encounter (Signed)
Called x3 rolls to voice mail , unable to leave a message to voice mailbox is full. Unable to leave a message. Patient may reschedule for the next available appointment.  L.Adalai Perl,LPN

## 2021-06-11 ENCOUNTER — Other Ambulatory Visit: Payer: Self-pay | Admitting: Endocrinology

## 2021-06-11 ENCOUNTER — Other Ambulatory Visit: Payer: Self-pay | Admitting: Internal Medicine

## 2021-06-11 DIAGNOSIS — K219 Gastro-esophageal reflux disease without esophagitis: Secondary | ICD-10-CM

## 2021-07-11 ENCOUNTER — Ambulatory Visit (INDEPENDENT_AMBULATORY_CARE_PROVIDER_SITE_OTHER): Payer: Medicare Other | Admitting: Internal Medicine

## 2021-07-11 ENCOUNTER — Encounter: Payer: Self-pay | Admitting: Internal Medicine

## 2021-07-11 ENCOUNTER — Other Ambulatory Visit: Payer: Self-pay

## 2021-07-11 VITALS — BP 138/86 | HR 83 | Temp 98.1°F | Ht 64.0 in | Wt 215.0 lb

## 2021-07-11 DIAGNOSIS — I10 Essential (primary) hypertension: Secondary | ICD-10-CM | POA: Diagnosis not present

## 2021-07-11 DIAGNOSIS — J301 Allergic rhinitis due to pollen: Secondary | ICD-10-CM

## 2021-07-11 DIAGNOSIS — M170 Bilateral primary osteoarthritis of knee: Secondary | ICD-10-CM

## 2021-07-11 DIAGNOSIS — N1831 Chronic kidney disease, stage 3a: Secondary | ICD-10-CM

## 2021-07-11 DIAGNOSIS — E785 Hyperlipidemia, unspecified: Secondary | ICD-10-CM

## 2021-07-11 DIAGNOSIS — M85851 Other specified disorders of bone density and structure, right thigh: Secondary | ICD-10-CM | POA: Diagnosis not present

## 2021-07-11 DIAGNOSIS — K219 Gastro-esophageal reflux disease without esophagitis: Secondary | ICD-10-CM

## 2021-07-11 DIAGNOSIS — E7801 Familial hypercholesterolemia: Secondary | ICD-10-CM | POA: Diagnosis not present

## 2021-07-11 LAB — BASIC METABOLIC PANEL
BUN: 11 mg/dL (ref 6–23)
CO2: 26 mEq/L (ref 19–32)
Calcium: 10.2 mg/dL (ref 8.4–10.5)
Chloride: 108 mEq/L (ref 96–112)
Creatinine, Ser: 1.01 mg/dL (ref 0.40–1.20)
GFR: 57.42 mL/min — ABNORMAL LOW (ref >60.00)
Glucose, Bld: 91 mg/dL (ref 70–99)
Potassium: 4.1 mEq/L (ref 3.5–5.1)
Sodium: 142 mEq/L (ref 135–145)

## 2021-07-11 LAB — LIPID PANEL
Cholesterol: 200 mg/dL (ref 0–200)
HDL: 51.7 mg/dL (ref >39.00)
LDL Cholesterol: 119 mg/dL — ABNORMAL HIGH (ref 0–99)
NonHDL: 148.25
Total CHOL/HDL Ratio: 4
Triglycerides: 145 mg/dL (ref 0.0–149.0)
VLDL: 29 mg/dL (ref 0.0–40.0)

## 2021-07-11 LAB — CBC WITH DIFFERENTIAL/PLATELET
Basophils Absolute: 0 10*3/uL (ref 0.0–0.1)
Basophils Relative: 0.5 % (ref 0.0–3.0)
Eosinophils Absolute: 0.1 10*3/uL (ref 0.0–0.7)
Eosinophils Relative: 2.1 % (ref 0.0–5.0)
HCT: 38.7 % (ref 36.0–46.0)
Hemoglobin: 12.8 g/dL (ref 12.0–15.0)
Lymphocytes Relative: 34.6 % (ref 12.0–46.0)
Lymphs Abs: 2 10*3/uL (ref 0.7–4.0)
MCHC: 33.1 g/dL (ref 30.0–36.0)
MCV: 94.5 fl (ref 78.0–100.0)
Monocytes Absolute: 0.5 10*3/uL (ref 0.1–1.0)
Monocytes Relative: 9.7 % (ref 3.0–12.0)
Neutro Abs: 3 10*3/uL (ref 1.4–7.7)
Neutrophils Relative %: 53.1 % (ref 43.0–77.0)
Platelets: 321 10*3/uL (ref 150.0–400.0)
RBC: 4.09 Mil/uL (ref 3.87–5.11)
RDW: 14.5 % (ref 11.5–15.5)
WBC: 5.6 10*3/uL (ref 4.0–10.5)

## 2021-07-11 LAB — CK: Total CK: 50 U/L (ref 7–177)

## 2021-07-11 MED ORDER — MONTELUKAST SODIUM 10 MG PO TABS: ORAL_TABLET | ORAL | 1 refills | Status: DC

## 2021-07-11 MED ORDER — NABUMETONE 500 MG PO TABS: 500.0000 mg | ORAL_TABLET | Freq: Two times a day (BID) | ORAL | 1 refills | Status: DC | PRN

## 2021-07-11 MED ORDER — EZETIMIBE 10 MG PO TABS: ORAL_TABLET | ORAL | 1 refills | Status: DC

## 2021-07-11 MED ORDER — AMLODIPINE BESYLATE 10 MG PO TABS: 10.0000 mg | ORAL_TABLET | Freq: Every day | ORAL | 1 refills | Status: DC

## 2021-07-11 MED ORDER — PRAVASTATIN SODIUM 40 MG PO TABS: 40.0000 mg | ORAL_TABLET | Freq: Every day | ORAL | 1 refills | Status: DC

## 2021-07-11 NOTE — Patient Instructions (Signed)

## 2021-07-11 NOTE — Progress Notes (Signed)
Subjective:  Patient ID: Kayla Lowe, female    DOB: Sep 20, 1955  Age: 66 y.o. MRN: [id number]  CC: Hypertension and Hyperlipidemia  This visit occurred during the SARS-CoV-2 public health emergency.  Safety protocols were in place, including screening questions prior to the visit, additional usage of staff PPE, and extensive cleaning of exam room while observing appropriate contact time as indicated for disinfecting solutions.    HPI Kayla Lowe presents for f/up -   She is active and denies chest pain, shortness of breath, diaphoresis, dizziness, lightheadedness, or edema.  Outpatient Medications Prior to Visit  Medication Sig Dispense Refill   aspirin EC 81 MG tablet Take 81 mg by mouth daily. Swallow whole.     carvedilol (COREG) 6.25 MG tablet TAKE 1 TABLET(6.25 MG) BY MOUTH TWICE DAILY WITH A MEAL 180 tablet 1   cetirizine (ZYRTEC) 10 MG tablet Take 10 mg by mouth daily.     Cholecalciferol (VITAMIN D) 50 MCG (2000 UT) CAPS Take 1 capsule by mouth daily.     cyclobenzaprine (FLEXERIL) 10 MG tablet Take 1 tablet (10 mg total) by mouth 3 (three) times daily as needed for muscle spasms. 30 tablet 2   fluticasone (FLONASE) 50 MCG/ACT nasal spray PLACE 2 SPRAYS INTO BOTH NOSTRILS DAILY. 16 g 2   pantoprazole (PROTONIX) 20 MG tablet TAKE 1 TABLET(20 MG) BY MOUTH DAILY 90 tablet 1   amLODipine (NORVASC) 10 MG tablet Take 1 tablet (10 mg total) by mouth daily. 90 tablet 1   COVID-19 mRNA bivalent vaccine, Pfizer, (PFIZER COVID-19 VAC BIVALENT) injection Inject into the muscle. 0.3 mL 0   ezetimibe (ZETIA) 10 MG tablet TAKE 1 TABLET(10 MG) BY MOUTH DAILY 90 tablet 1   influenza vaccine adjuvanted (FLUAD) 0.5 ML injection Inject into the muscle. 0.5 mL 0   montelukast (SINGULAIR) 10 MG tablet TAKE 1 TABLET(10 MG) BY MOUTH AT BEDTIME 90 tablet 1   nabumetone (RELAFEN) 500 MG tablet Take 1 tablet (500 mg total) by mouth 2 (two) times daily as needed. 180 tablet 1    pravastatin (PRAVACHOL) 40 MG tablet Take 1 tablet (40 mg total) by mouth daily. 90 tablet 1   No facility-administered medications prior to visit.    ROS Review of Systems  Constitutional:  Negative for diaphoresis, fatigue and unexpected weight change.  HENT: Negative.    Eyes: Negative.   Respiratory:  Negative for cough, chest tightness, shortness of breath and wheezing.   Cardiovascular:  Negative for chest pain, palpitations and leg swelling.  Gastrointestinal:  Negative for abdominal pain, constipation, diarrhea, nausea and vomiting.  Endocrine: Negative.   Genitourinary: Negative.  Negative for difficulty urinating.  Musculoskeletal:  Negative for arthralgias and myalgias.  Skin: Negative.   Neurological:  Negative for dizziness, weakness and light-headedness.  Hematological:  Negative for adenopathy. Does not bruise/bleed easily.  Psychiatric/Behavioral: Negative.     Objective:  BP 138/86 (BP Location: Left Arm, Patient Position: Sitting, Cuff Size: Large)    Pulse 83    Temp 98.1 F (36.7 C) (Oral)    Ht 5\' 4"  (1.626 m)    Wt 215 lb (97.5 kg)    SpO2 98%    BMI 36.90 kg/m   BP Readings from Last 3 Encounters:  07/11/21 138/86  06/06/21 126/84  01/31/21 140/70    Wt Readings from Last 3 Encounters:  07/11/21 215 lb (97.5 kg)  06/06/21 216 lb 12.8 oz (98.3 kg)  01/31/21 213 lb (96.6 kg)    Physical Exam  Vitals reviewed.  Constitutional:      Appearance: She is obese.  HENT:     Mouth/Throat:     Mouth: Mucous membranes are moist.  Eyes:     General: No scleral icterus.    Conjunctiva/sclera: Conjunctivae normal.  Cardiovascular:     Rate and Rhythm: Normal rate and regular rhythm.     Heart sounds: No murmur heard. Pulmonary:     Effort: Pulmonary effort is normal.     Breath sounds: No stridor. No wheezing, rhonchi or rales.  Abdominal:     General: Abdomen is flat.     Palpations: There is no mass.     Tenderness: There is no abdominal  tenderness. There is no guarding.     Hernia: No hernia is present.  Musculoskeletal:        General: Normal range of motion.     Cervical back: Neck supple.     Right lower leg: No edema.     Left lower leg: No edema.  Lymphadenopathy:     Cervical: No cervical adenopathy.  Skin:    General: Skin is warm and dry.  Neurological:     General: No focal deficit present.     Mental Status: She is alert.    Lab Results  Component Value Date   WBC 5.6 07/11/2021   HGB 12.8 07/11/2021   HCT 38.7 07/11/2021   PLT 321.0 07/11/2021   GLUCOSE 91 07/11/2021   CHOL 200 07/11/2021   TRIG 145.0 07/11/2021   HDL 51.70 07/11/2021   LDLDIRECT 220.0 09/16/2019   LDLCALC 119 (H) 07/11/2021   ALT 13 01/09/2021   AST 19 01/09/2021   NA 142 07/11/2021   K 4.1 07/11/2021   CL 108 07/11/2021   CREATININE 1.01 07/11/2021   BUN 11 07/11/2021   CO2 26 07/11/2021   TSH 1.14 01/09/2021   INR 0.97 07/10/2013   HGBA1C 5.4 08/31/2019    MR BRAIN W WO CONTRAST  Result Date: 02/20/2021 CLINICAL DATA:  Left optic neuritis EXAM: MRI HEAD AND ORBITS WITHOUT AND WITH CONTRAST TECHNIQUE: Multiplanar, multiecho pulse sequences of the brain and surrounding structures were obtained without and with intravenous contrast. Multiplanar, multiecho pulse sequences of the orbits and surrounding structures were obtained including fat saturation techniques, before and after intravenous contrast administration. CONTRAST:  62mL MULTIHANCE GADOBENATE DIMEGLUMINE 529 MG/ML IV SOLN COMPARISON:  None. FINDINGS: MRI HEAD FINDINGS Brain: No acute infarct, mass effect or extra-axial collection. No acute or chronic hemorrhage. There is multifocal hyperintense T2-weighted signal within the white matter. Parenchymal volume and CSF spaces are normal. A partially empty sella is incidentally noted. Vascular: Major flow voids are preserved. Skull and upper cervical spine: Normal calvarium and skull base. Visualized upper cervical spine and  soft tissues are normal. Sinuses/Orbits:No paranasal sinus fluid levels or advanced mucosal thickening. No mastoid or middle ear effusion. Normal orbits. MRI ORBITS FINDINGS Orbits: There is bilateral mild perineural contrast enhancement. Normal extraocular muscles. The optic nerves themselves are normal. Normal globes Visualized sinuses: Negative Soft tissues: Negative. IMPRESSION: 1. 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 2. No acute intracranial abnormality. 3. Numerous bilateral hyperintense T2-weighted signal white matter lesions in a nonspecific pattern. This most commonly indicates chronic small vessel ischemia. Demyelinating disease is a secondary consideration Electronically Signed   By: Ulyses Jarred M.D.   On: 02/20/2021 23:52   MR ORBITS W WO CONTRAST  Result Date: 02/20/2021 CLINICAL DATA:  Left optic neuritis EXAM: MRI  HEAD AND ORBITS WITHOUT AND WITH CONTRAST TECHNIQUE: Multiplanar, multiecho pulse sequences of the brain and surrounding structures were obtained without and with intravenous contrast. Multiplanar, multiecho pulse sequences of the orbits and surrounding structures were obtained including fat saturation techniques, before and after intravenous contrast administration. CONTRAST:  45mL MULTIHANCE GADOBENATE DIMEGLUMINE 529 MG/ML IV SOLN COMPARISON:  None. FINDINGS: MRI HEAD FINDINGS Brain: No acute infarct, mass effect or extra-axial collection. No acute or chronic hemorrhage. There is multifocal hyperintense T2-weighted signal within the white matter. Parenchymal volume and CSF spaces are normal. A partially empty sella is incidentally noted. Vascular: Major flow voids are preserved. Skull and upper cervical spine: Normal calvarium and skull base. Visualized upper cervical spine and soft tissues are normal. Sinuses/Orbits:No paranasal sinus fluid levels or advanced mucosal thickening. No mastoid or middle ear effusion. Normal orbits. MRI  ORBITS FINDINGS Orbits: There is bilateral mild perineural contrast enhancement. Normal extraocular muscles. The optic nerves themselves are normal. Normal globes Visualized sinuses: Negative Soft tissues: Negative. IMPRESSION: 1. 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 2. No acute intracranial abnormality. 3. Numerous bilateral hyperintense T2-weighted signal white matter lesions in a nonspecific pattern. This most commonly indicates chronic small vessel ischemia. Demyelinating disease is a secondary consideration Electronically Signed   By: Ulyses Jarred M.D.   On: 02/20/2021 23:52    Assessment & Plan:   Kayla Lowe was seen today for hypertension and hyperlipidemia.  Diagnoses and all orders for this visit:  Essential hypertension, benign- Her blood pressure is adequately well controlled. -     CBC with Differential/Platelet; Future -     Basic metabolic panel; Future -     Basic metabolic panel -     CBC with Differential/Platelet  Gastroesophageal reflux disease without esophagitis- Her symptoms are well controlled. -     CBC with Differential/Platelet; Future -     CBC with Differential/Platelet  Osteopenia of neck of right femur  Stage 3a chronic kidney disease ([place])- Her renal function is stable. -     Basic metabolic panel; Future -     Basic metabolic panel  Familial hypercholesteremia -     Lipid panel; Future -     CK; Future -     CK -     Lipid panel  Essential hypertension -     amLODipine (NORVASC) 10 MG tablet; Take 1 tablet (10 mg total) by mouth daily.  Seasonal allergic rhinitis due to pollen -     montelukast (SINGULAIR) 10 MG tablet; TAKE 1 TABLET(10 MG) BY MOUTH AT BEDTIME Strength: 10 mg  Dyslipidemia, goal LDL below 130- LDL goal achieved. Doing well on the statin  -     ezetimibe (ZETIA) 10 MG tablet; TAKE 1 TABLET(10 MG) BY MOUTH DAILY Strength: 10 mg -     pravastatin (PRAVACHOL) 40 MG tablet; Take 1  tablet (40 mg total) by mouth daily.  Primary osteoarthritis of both knees -     nabumetone (RELAFEN) 500 MG tablet; Take 1 tablet (500 mg total) by mouth 2 (two) times daily as needed.   I have discontinued Kayla Lowe influenza vaccine adjuvanted and Pfizer COVID-19 Vac Bivalent. I have also changed her montelukast and ezetimibe. Additionally, I am having her maintain her fluticasone, aspirin EC, cetirizine, Vitamin D, cyclobenzaprine, carvedilol, pantoprazole, amLODipine, nabumetone, and pravastatin.  Meds ordered this encounter  Medications   amLODipine (NORVASC) 10 MG tablet    Sig: Take 1 tablet (10 mg total) by mouth daily.  Dispense:  90 tablet    Refill:  1   montelukast (SINGULAIR) 10 MG tablet    Sig: TAKE 1 TABLET(10 MG) BY MOUTH AT BEDTIME Strength: 10 mg    Dispense:  90 tablet    Refill:  1   ezetimibe (ZETIA) 10 MG tablet    Sig: TAKE 1 TABLET(10 MG) BY MOUTH DAILY Strength: 10 mg    Dispense:  90 tablet    Refill:  1   nabumetone (RELAFEN) 500 MG tablet    Sig: Take 1 tablet (500 mg total) by mouth 2 (two) times daily as needed.    Dispense:  180 tablet    Refill:  1   pravastatin (PRAVACHOL) 40 MG tablet    Sig: Take 1 tablet (40 mg total) by mouth daily.    Dispense:  90 tablet    Refill:  1     Follow-up: Return in about 6 months (around 01/08/2022).  Kayla Calico, MD

## 2021-08-20 ENCOUNTER — Other Ambulatory Visit: Payer: Self-pay | Admitting: Internal Medicine

## 2021-08-20 DIAGNOSIS — I10 Essential (primary) hypertension: Secondary | ICD-10-CM

## 2021-11-21 ENCOUNTER — Telehealth: Payer: Self-pay | Admitting: Internal Medicine

## 2021-11-21 NOTE — Telephone Encounter (Signed)
LVM for pt to rtn my call to schedule AWV with [facility] call back # [phone] 

## 2021-11-27 ENCOUNTER — Other Ambulatory Visit: Payer: Self-pay | Admitting: Internal Medicine

## 2021-11-27 DIAGNOSIS — Z1231 Encounter for screening mammogram for malignant neoplasm of breast: Secondary | ICD-10-CM

## 2021-12-21 ENCOUNTER — Ambulatory Visit: Payer: Medicare Other

## 2022-01-04 ENCOUNTER — Ambulatory Visit
Admission: RE | Admit: 2022-01-04 | Discharge: 2022-01-04 | Disposition: A | Discharge status: Home / Self Care | Payer: Medicare Other | Source: Ambulatory Visit | Attending: Internal Medicine | Admitting: Internal Medicine

## 2022-01-04 DIAGNOSIS — Z1231 Encounter for screening mammogram for malignant neoplasm of breast: Secondary | ICD-10-CM | POA: Diagnosis not present

## 2022-01-09 ENCOUNTER — Ambulatory Visit: Payer: Medicare Other | Admitting: Internal Medicine

## 2022-01-17 ENCOUNTER — Encounter: Payer: Self-pay | Admitting: Internal Medicine

## 2022-01-17 ENCOUNTER — Ambulatory Visit (INDEPENDENT_AMBULATORY_CARE_PROVIDER_SITE_OTHER): Payer: Medicare Other | Admitting: Internal Medicine

## 2022-01-17 VITALS — BP 160/80 | HR 70 | Temp 98.2°F | Resp 16 | Wt 214.0 lb

## 2022-01-17 DIAGNOSIS — I10 Essential (primary) hypertension: Secondary | ICD-10-CM | POA: Diagnosis not present

## 2022-01-17 DIAGNOSIS — Z0001 Encounter for general adult medical examination with abnormal findings: Secondary | ICD-10-CM | POA: Diagnosis not present

## 2022-01-17 DIAGNOSIS — I119 Hypertensive heart disease without heart failure: Secondary | ICD-10-CM | POA: Diagnosis not present

## 2022-01-17 DIAGNOSIS — N1831 Chronic kidney disease, stage 3a: Secondary | ICD-10-CM

## 2022-01-17 LAB — BASIC METABOLIC PANEL
BUN: 13 mg/dL (ref 6–23)
CO2: 28 mEq/L (ref 19–32)
Calcium: 10.4 mg/dL (ref 8.4–10.5)
Chloride: 106 mEq/L (ref 96–112)
Creatinine, Ser: 1.04 mg/dL (ref 0.40–1.20)
GFR: 55.23 mL/min — ABNORMAL LOW (ref >60.00)
Glucose, Bld: 92 mg/dL (ref 70–99)
Potassium: 4.1 mEq/L (ref 3.5–5.1)
Sodium: 142 mEq/L (ref 135–145)

## 2022-01-17 LAB — URINALYSIS, ROUTINE W REFLEX MICROSCOPIC
Bilirubin Urine: NEGATIVE
Hgb urine dipstick: NEGATIVE
Ketones, ur: NEGATIVE
Leukocytes,Ua: NEGATIVE
Nitrite: NEGATIVE
RBC / HPF: NONE SEEN (ref 0–?)
Specific Gravity, Urine: 1.015 (ref 1.000–1.030)
Total Protein, Urine: NEGATIVE
Urine Glucose: NEGATIVE
Urobilinogen, UA: 1 (ref 0.0–1.0)
pH: 7 (ref 5.0–8.0)

## 2022-01-17 MED ORDER — HYDRALAZINE HCL 25 MG PO TABS: 25.0000 mg | ORAL_TABLET | Freq: Three times a day (TID) | ORAL | 0 refills | Status: DC

## 2022-01-17 NOTE — Progress Notes (Signed)
Subjective:  Patient ID: Kayla Lowe, female    DOB: 06-Apr-1956  Age: 66 y.o. MRN: [id number]  CC: Hypertension and Annual Exam   HPI Kayla Lowe presents for a CPX and f/up -  She walks about 2 miles/day and does not experience DOE, CP, diaphoresis, or edema.  Outpatient Medications Prior to Visit  Medication Sig Dispense Refill   amLODipine (NORVASC) 10 MG tablet Take 1 tablet (10 mg total) by mouth daily. 90 tablet 1   aspirin EC 81 MG tablet Take 81 mg by mouth daily. Swallow whole.     carvedilol (COREG) 6.25 MG tablet TAKE 1 TABLET(6.25 MG) BY MOUTH TWICE DAILY WITH A MEAL 180 tablet 1   cetirizine (ZYRTEC) 10 MG tablet Take 10 mg by mouth daily.     Cholecalciferol (VITAMIN D) 50 MCG (2000 UT) CAPS Take 1 capsule by mouth daily.     cyclobenzaprine (FLEXERIL) 10 MG tablet Take 1 tablet (10 mg total) by mouth 3 (three) times daily as needed for muscle spasms. 30 tablet 2   ezetimibe (ZETIA) 10 MG tablet TAKE 1 TABLET(10 MG) BY MOUTH DAILY Strength: 10 mg 90 tablet 1   fluticasone (FLONASE) 50 MCG/ACT nasal spray PLACE 2 SPRAYS INTO BOTH NOSTRILS DAILY. 16 g 2   montelukast (SINGULAIR) 10 MG tablet TAKE 1 TABLET(10 MG) BY MOUTH AT BEDTIME Strength: 10 mg 90 tablet 1   nabumetone (RELAFEN) 500 MG tablet Take 1 tablet (500 mg total) by mouth 2 (two) times daily as needed. 180 tablet 1   pantoprazole (PROTONIX) 20 MG tablet TAKE 1 TABLET(20 MG) BY MOUTH DAILY 90 tablet 1   pravastatin (PRAVACHOL) 40 MG tablet Take 1 tablet (40 mg total) by mouth daily. 90 tablet 1   No facility-administered medications prior to visit.    ROS Review of Systems  Constitutional: Negative.  Negative for diaphoresis and fatigue.  HENT: Negative.    Eyes: Negative.   Respiratory:  Negative for cough, chest tightness, shortness of breath and wheezing.   Cardiovascular:  Negative for chest pain, palpitations and leg swelling.  Gastrointestinal:  Negative for abdominal  pain, diarrhea and nausea.  Endocrine: Negative.   Genitourinary: Negative.  Negative for difficulty urinating and hematuria.  Musculoskeletal: Negative.   Skin: Negative.   Neurological:  Negative for dizziness, weakness, light-headedness and headaches.  Hematological:  Negative for adenopathy. Does not bruise/bleed easily.  Psychiatric/Behavioral: Negative.      Objective:  BP (!) 160/80 (BP Location: Left Arm, Patient Position: Sitting, Cuff Size: Normal)   Pulse 70   Temp 98.2 F (36.8 C) (Oral)   Resp 16   Wt 214 lb (97.1 kg)   SpO2 95%   BMI 36.73 kg/m   BP Readings from Last 3 Encounters:  01/17/22 (!) 160/80  07/11/21 138/86  06/06/21 126/84    Wt Readings from Last 3 Encounters:  01/17/22 214 lb (97.1 kg)  07/11/21 215 lb (97.5 kg)  06/06/21 216 lb 12.8 oz (98.3 kg)    Physical Exam Vitals reviewed.  HENT:     Nose: Nose normal.     Mouth/Throat:     Mouth: Mucous membranes are moist.  Eyes:     General: No scleral icterus.    Conjunctiva/sclera: Conjunctivae normal.  Cardiovascular:     Rate and Rhythm: Normal rate and regular rhythm.     Heart sounds: Normal heart sounds, S1 normal and S2 normal. No murmur heard.    No gallop.     Comments: EKG-  NSR with SA, 69 bpm LAD. Minimal LVH. Diffusely flat/inverted T waves No Q waves Pulmonary:     Effort: Pulmonary effort is normal.     Breath sounds: No stridor. No wheezing, rhonchi or rales.  Abdominal:     General: Abdomen is flat.     Palpations: There is no mass.     Tenderness: There is no abdominal tenderness. There is no guarding.     Hernia: No hernia is present.  Musculoskeletal:     Cervical back: Neck supple.     Right lower leg: No edema.     Left lower leg: No edema.  Lymphadenopathy:     Cervical: No cervical adenopathy.  Skin:    General: Skin is warm and dry.  Neurological:     General: No focal deficit present.     Mental Status: She is alert.  Psychiatric:        Mood and  Affect: Mood normal.        Behavior: Behavior normal.     Lab Results  Component Value Date   WBC 5.6 07/11/2021   HGB 12.8 07/11/2021   HCT 38.7 07/11/2021   PLT 321.0 07/11/2021   GLUCOSE 92 01/17/2022   CHOL 200 07/11/2021   TRIG 145.0 07/11/2021   HDL 51.70 07/11/2021   LDLDIRECT 220.0 09/16/2019   LDLCALC 119 (H) 07/11/2021   ALT 13 01/09/2021   AST 19 01/09/2021   NA 142 01/17/2022   K 4.1 01/17/2022   CL 106 01/17/2022   CREATININE 1.04 01/17/2022   BUN 13 01/17/2022   CO2 28 01/17/2022   TSH 1.14 01/09/2021   INR 0.97 07/10/2013   HGBA1C 5.4 08/31/2019    MM 3D SCREEN BREAST BILATERAL  Result Date: 01/05/2022 CLINICAL DATA:  Screening. EXAM: DIGITAL SCREENING BILATERAL MAMMOGRAM WITH TOMOSYNTHESIS AND CAD TECHNIQUE: Bilateral screening digital craniocaudal and mediolateral oblique mammograms were obtained. Bilateral screening digital breast tomosynthesis was performed. The images were evaluated with computer-aided detection. COMPARISON:  Previous exam(s). ACR Breast Density Category b: There are scattered areas of fibroglandular density. FINDINGS: There are no findings suspicious for malignancy. IMPRESSION: No mammographic evidence of malignancy. A result letter of this screening mammogram will be mailed directly to the patient. RECOMMENDATION: Screening mammogram in one year. (Code:SM-B-01Y) BI-RADS CATEGORY  1: Negative. Electronically Signed   By: Sande Brothers M.D.   On: 01/05/2022 08:49    Assessment & Plan:   Kayla Lowe was seen today for hypertension and annual exam.  Diagnoses and all orders for this visit:  Essential hypertension, benign-Her BP is not adequately well controlled. Will add hydralazine.  -     Basic metabolic panel; Future -     Aldosterone + renin activity w/ ratio; Future -     EKG 12-Lead -     Urinalysis, Routine w reflex microscopic; Future -     Urinalysis, Routine w reflex microscopic -     Aldosterone + renin activity w/ ratio -      Basic metabolic panel -     hydrALAZINE (APRESOLINE) 25 MG tablet; Take 1 tablet (25 mg total) by mouth 3 (three) times daily.  LVH (left ventricular hypertrophy) due to hypertensive disease, without heart failure- Will try to get better control of her BP. -     Urinalysis, Routine w reflex microscopic; Future -     Urinalysis, Routine w reflex microscopic -     hydrALAZINE (APRESOLINE) 25 MG tablet; Take 1 tablet (25 mg total) by mouth  3 (three) times daily.  Encounter for general adult medical examination with abnormal findings- Exam completed, labs reviewed, she refused a flu vaccine, cancer screenings are UTD, pt ed material was given.  Stage 3a chronic kidney disease ([facility])- Her renal fxn is stable.   I am having Stefany [place] start on hydrALAZINE. I am also having her maintain her fluticasone, aspirin EC, cetirizine, Vitamin D, cyclobenzaprine, pantoprazole, amLODipine, montelukast, ezetimibe, nabumetone, pravastatin, and carvedilol.  Meds ordered this encounter  Medications   hydrALAZINE (APRESOLINE) 25 MG tablet    Sig: Take 1 tablet (25 mg total) by mouth 3 (three) times daily.    Dispense:  270 tablet    Refill:  0     Follow-up: Return in about 3 months (around 04/18/2022).  Sanda Linger, MD

## 2022-01-17 NOTE — Patient Instructions (Signed)
Hypertension, Adult High blood pressure (hypertension) is when the force of blood pumping through the arteries is too strong. The arteries are the blood vessels that carry blood from the heart throughout the body. Hypertension forces the heart to work harder to pump blood and may cause arteries to become narrow or stiff. Untreated or uncontrolled hypertension can lead to a heart attack, heart failure, a stroke, kidney disease, and other problems. A blood pressure reading consists of a higher number over a lower number. Ideally, your blood pressure should be below 120/80. The first ("top") number is called the systolic pressure. It is a measure of the pressure in your arteries as your heart beats. The second ("bottom") number is called the diastolic pressure. It is a measure of the pressure in your arteries as the heart relaxes. What are the causes? The exact cause of this condition is not known. There are some conditions that result in high blood pressure. What increases the risk? Certain factors may make you more likely to develop high blood pressure. Some of these risk factors are under your control, including: Smoking. Not getting enough exercise or physical activity. Being overweight. Having too much fat, sugar, calories, or salt (sodium) in your diet. Drinking too much alcohol. Other risk factors include: Having a personal history of heart disease, diabetes, high cholesterol, or kidney disease. Stress. Having a family history of high blood pressure and high cholesterol. Having obstructive sleep apnea. Age. The risk increases with age. What are the signs or symptoms? High blood pressure may not cause symptoms. Very high blood pressure (hypertensive crisis) may cause: Headache. Fast or irregular heartbeats (palpitations). Shortness of breath. Nosebleed. Nausea and vomiting. Vision changes. Severe chest pain, dizziness, and seizures. How is this diagnosed? This condition is diagnosed by  measuring your blood pressure while you are seated, with your arm resting on a flat surface, your legs uncrossed, and your feet flat on the floor. The cuff of the blood pressure monitor will be placed directly against the skin of your upper arm at the level of your heart. Blood pressure should be measured at least twice using the same arm. Certain conditions can cause a difference in blood pressure between your right and left arms. If you have a high blood pressure reading during one visit or you have normal blood pressure with other risk factors, you may be asked to: Return on a different day to have your blood pressure checked again. Monitor your blood pressure at home for 1 week or longer. If you are diagnosed with hypertension, you may have other blood or imaging tests to help your health care provider understand your overall risk for other conditions. How is this treated? This condition is treated by making healthy lifestyle changes, such as eating healthy foods, exercising more, and reducing your alcohol intake. You may be referred for counseling on a healthy diet and physical activity. Your health care provider may prescribe medicine if lifestyle changes are not enough to get your blood pressure under control and if: Your systolic blood pressure is above 130. Your diastolic blood pressure is above 80. Your personal target blood pressure may vary depending on your medical conditions, your age, and other factors. Follow these instructions at home: Eating and drinking  Eat a diet that is high in fiber and potassium, and low in sodium, added sugar, and fat. An example of this eating plan is called the DASH diet. DASH stands for Dietary Approaches to Stop Hypertension. To eat this way: Eat   plenty of fresh fruits and vegetables. Try to fill one half of your plate at each meal with fruits and vegetables. Eat whole grains, such as whole-wheat pasta, brown rice, or whole-grain bread. Fill about one  fourth of your plate with whole grains. Eat or drink low-fat dairy products, such as skim milk or low-fat yogurt. Avoid fatty cuts of meat, processed or cured meats, and poultry with skin. Fill about one fourth of your plate with lean proteins, such as fish, chicken without skin, beans, eggs, or tofu. Avoid pre-made and processed foods. These tend to be higher in sodium, added sugar, and fat. Reduce your daily sodium intake. Many people with hypertension should eat less than 1,500 mg of sodium a day. Do not drink alcohol if: Your health care provider tells you not to drink. You are pregnant, may be pregnant, or are planning to become pregnant. If you drink alcohol: Limit how much you have to: 0-1 drink a day for women. 0-2 drinks a day for men. Know how much alcohol is in your drink. In the [place] one drink equals one 12 oz bottle of beer (355 mL), one 5 oz glass of wine (148 mL), or one 1 oz glass of hard liquor (44 mL). Lifestyle  Work with your health care provider to maintain a healthy body weight or to lose weight. Ask what an ideal weight is for you. Get at least 30 minutes of exercise that causes your heart to beat faster (aerobic exercise) most days of the week. Activities may include walking, swimming, or biking. Include exercise to strengthen your muscles (resistance exercise), such as Pilates or lifting weights, as part of your weekly exercise routine. Try to do these types of exercises for 30 minutes at least 3 days a week. Do not use any products that contain nicotine or tobacco. These products include cigarettes, chewing tobacco, and vaping devices, such as e-cigarettes. If you need help quitting, ask your health care provider. Monitor your blood pressure at home as told by your health care provider. Keep all follow-up visits. This is important. Medicines Take over-the-counter and prescription medicines only as told by your health care provider. Follow directions carefully. Blood  pressure medicines must be taken as prescribed. Do not skip doses of blood pressure medicine. Doing this puts you at risk for problems and can make the medicine less effective. Ask your health care provider about side effects or reactions to medicines that you should watch for. Contact a health care provider if you: Think you are having a reaction to a medicine you are taking. Have headaches that keep coming back (recurring). Feel dizzy. Have swelling in your ankles. Have trouble with your vision. Get help right away if you: Develop a severe headache or confusion. Have unusual weakness or numbness. Feel faint. Have severe pain in your chest or abdomen. Vomit repeatedly. Have trouble breathing. These symptoms may be an emergency. Get help right away. Call 911. Do not wait to see if the symptoms will go away. Do not drive yourself to the hospital. Summary Hypertension is when the force of blood pumping through your arteries is too strong. If this condition is not controlled, it may put you at risk for serious complications. Your personal target blood pressure may vary depending on your medical conditions, your age, and other factors. For most people, a normal blood pressure is less than 120/80. Hypertension is treated with lifestyle changes, medicines, or a combination of both. Lifestyle changes include losing weight, eating a healthy,   low-sodium diet, exercising more, and limiting alcohol. This information is not intended to replace advice given to you by your health care provider. Make sure you discuss any questions you have with your health care provider. Document Revised: 03/07/2021 Document Reviewed: 03/07/2021 Elsevier Patient Education  2023 Elsevier Inc.  

## 2022-01-25 ENCOUNTER — Telehealth (INDEPENDENT_AMBULATORY_CARE_PROVIDER_SITE_OTHER): Payer: Medicare Other | Admitting: Nurse Practitioner

## 2022-01-25 VITALS — BP 128/85 | HR 83 | Temp 98.9°F

## 2022-01-25 DIAGNOSIS — U071 COVID-19: Secondary | ICD-10-CM | POA: Insufficient documentation

## 2022-01-25 LAB — ALDOSTERONE + RENIN ACTIVITY W/ RATIO
ALDO / PRA Ratio: 16.7 Ratio (ref 0.9–28.9)
Aldosterone: 4 ng/dL
Renin Activity: 0.24 ng/mL/h — ABNORMAL LOW (ref 0.25–5.82)

## 2022-01-25 MED ORDER — MOLNUPIRAVIR EUA 200MG CAPSULE: 4.0000 | ORAL_CAPSULE | Freq: Two times a day (BID) | ORAL | 0 refills | Status: AC

## 2022-01-25 MED ORDER — BENZONATATE 100 MG PO CAPS: 100.0000 mg | ORAL_CAPSULE | Freq: Two times a day (BID) | ORAL | 0 refills | Status: DC | PRN

## 2022-01-25 NOTE — Assessment & Plan Note (Signed)
Acute, within 5-day window to start antiviral.  Discussed risk versus benefit of taking medication patient would like to start, will prescribe molnupiravir as patient is on multiple medications that can also interact with Paxlovid.  We will treat symptomatically with Tessalon Perles and Flonase nasal spray as well as patient's over-the-counter Coricidin.  Patient educated on isolation and quarantine recommendations as well as to proceed to the emergency department if symptoms worsen over the weekend.  Patient encouraged to call our office if symptoms persist into next week.  Patient reports understanding.

## 2022-01-25 NOTE — Progress Notes (Signed)
   Established Patient Office Visit  An audio-only tele-health visit was completed today for this patient. I connected with  Kayla Lowe on 01/25/22 utilizing audio-only technology and verified that I am speaking with the correct person using two identifiers. The patient was located at their home, and I was located at the office of [facility] at [facility] during the encounter. I discussed the limitations of evaluation and management by telemedicine. The patient expressed understanding and agreed to proceed.     Subjective   Patient ID: Kayla Lowe, female    DOB: 28-Dec-1955  Age: 66 y.o. MRN: [id number]  Chief Complaint  Patient presents with   Covid Positive   Patient arrives for virtual visit for the above.  Reports symptoms started 3 days ago, was exposed to COVID-positive person at church.  Has been vaccinated fully against COVID-19.  Has been taking Robitussin and Coricidin at home.    Review of Systems  Constitutional:  Positive for malaise/fatigue. Negative for chills and fever.  Respiratory:  Positive for cough and sputum production. Negative for shortness of breath and wheezing.   Cardiovascular:  Positive for chest pain (yesterday; resolved today).  Gastrointestinal:  Negative for diarrhea, nausea and vomiting.  Musculoskeletal:  Negative for back pain and myalgias.  Neurological:  Negative for headaches.      Objective:     BP 128/85   Pulse 83   Temp 98.9 F (37.2 C)    Physical Exam Comprehensive physical exam not completed today as office visit was conducted remotely.  Patient sounds fatigued, no acute distress noted, no need to stop talking in order to breathe.  Patient was alert and oriented, and appeared to have appropriate judgment.   No results found for any visits on 01/25/22.    The 10-year ASCVD risk score (Arnett DK, et al., 2019) is: 10.8%    Assessment & Plan:   Problem List Items Addressed This  Visit   None Visit Diagnoses     COVID-19    -  Primary   Relevant Medications   benzonatate (TESSALON) 100 MG capsule   molnupiravir EUA (LAGEVRIO) 200 mg CAPS capsule       No follow-ups on file.  Total time spent the telephone was 13 minutes and 33 seconds.   Elenore Paddy, NP

## 2022-02-12 ENCOUNTER — Other Ambulatory Visit: Payer: Self-pay | Admitting: Internal Medicine

## 2022-02-12 DIAGNOSIS — I10 Essential (primary) hypertension: Secondary | ICD-10-CM

## 2022-02-18 ENCOUNTER — Other Ambulatory Visit: Payer: Self-pay | Admitting: Internal Medicine

## 2022-02-18 DIAGNOSIS — I10 Essential (primary) hypertension: Secondary | ICD-10-CM

## 2022-02-18 DIAGNOSIS — R252 Cramp and spasm: Secondary | ICD-10-CM

## 2022-02-25 ENCOUNTER — Other Ambulatory Visit: Payer: Self-pay | Admitting: Internal Medicine

## 2022-02-25 DIAGNOSIS — M170 Bilateral primary osteoarthritis of knee: Secondary | ICD-10-CM

## 2022-02-27 ENCOUNTER — Other Ambulatory Visit (HOSPITAL_BASED_OUTPATIENT_CLINIC_OR_DEPARTMENT_OTHER): Payer: Self-pay

## 2022-02-27 MED ORDER — COMIRNATY 30 MCG/0.3ML IM SUSY
PREFILLED_SYRINGE | INTRAMUSCULAR | 0 refills | Status: DC
  Filled 2022-02-27: qty 0.3, 1d supply, fill #0

## 2022-02-27 MED ORDER — INFLUENZA VAC A&B SA ADJ QUAD 0.5 ML IM PRSY
PREFILLED_SYRINGE | INTRAMUSCULAR | 0 refills | Status: DC
  Filled 2022-02-27: qty 0.5, 1d supply, fill #0

## 2022-03-11 ENCOUNTER — Other Ambulatory Visit: Payer: Self-pay | Admitting: Internal Medicine

## 2022-03-11 DIAGNOSIS — E785 Hyperlipidemia, unspecified: Secondary | ICD-10-CM

## 2022-03-11 DIAGNOSIS — K219 Gastro-esophageal reflux disease without esophagitis: Secondary | ICD-10-CM

## 2022-03-22 ENCOUNTER — Ambulatory Visit (INDEPENDENT_AMBULATORY_CARE_PROVIDER_SITE_OTHER): Payer: Medicare Other

## 2022-03-22 VITALS — Ht 64.0 in | Wt 214.0 lb

## 2022-03-22 DIAGNOSIS — Z13820 Encounter for screening for osteoporosis: Secondary | ICD-10-CM

## 2022-03-22 DIAGNOSIS — Z0000 Encounter for general adult medical examination without abnormal findings: Secondary | ICD-10-CM | POA: Diagnosis not present

## 2022-03-22 NOTE — Progress Notes (Signed)
Virtual Visit via Telephone Note  I connected with  Kayla Lowe on 03/22/22 at  3:00 PM EST by telephone and verified that I am speaking with the correct person using two identifiers.  Location: Patient: Home Provider: [facility] Persons participating in the virtual visit: patient/Nurse Health Advisor   I discussed the limitations, risks, security and privacy concerns of performing an evaluation and management service by telephone and the availability of in person appointments. The patient expressed understanding and agreed to proceed.  Interactive audio and video telecommunications were attempted between this nurse and patient, however failed, due to patient having technical difficulties OR patient did not have access to video capability.  We continued and completed visit with audio only.  Some vital signs may be absent or patient reported.   Mickeal Needy, LPN  Subjective:   Kayla Lowe is a 66 y.o. female who presents for Medicare Annual (Subsequent) preventive examination.  Review of Systems     Cardiac Risk Factors include: advanced age (>76men, >13 women);dyslipidemia;hypertension;obesity (BMI >30kg/m2);family history of premature cardiovascular disease     Objective:    Today's Vitals   03/22/22 1503  Weight: 214 lb (97.1 kg)  Height: 5\' 4"  (1.626 m)  PainSc: 0-No pain   Body mass index is 36.73 kg/m.     03/22/2022    3:06 PM 04/18/2020    8:55 AM 01/18/2017    8:32 AM 11/30/2016    9:58 AM 08/31/2016    4:28 PM 01/05/2014    7:52 AM  Advanced Directives  Does Patient Have a Medical Advance Directive? No No No No No No  Would patient like information on creating a medical advance directive? No - Patient declined No - Patient declined No - Patient declined       Current Medications (verified) Outpatient Encounter Medications as of 03/22/2022  Medication Sig   amLODipine (NORVASC) 10 MG tablet TAKE 1 TABLET(10 MG) BY  MOUTH DAILY   aspirin EC 81 MG tablet Take 81 mg by mouth daily. Swallow whole.   benzonatate (TESSALON) 100 MG capsule Take 1 capsule (100 mg total) by mouth 2 (two) times daily as needed for cough.   carvedilol (COREG) 6.25 MG tablet TAKE 1 TABLET(6.25 MG) BY MOUTH TWICE DAILY WITH A MEAL   cetirizine (ZYRTEC) 10 MG tablet Take 10 mg by mouth daily.   Cholecalciferol (VITAMIN D) 50 MCG (2000 UT) CAPS Take 1 capsule by mouth daily.   COVID-19 mRNA vaccine 2023-2024 (COMIRNATY) syringe Inject into the muscle.   cyclobenzaprine (FLEXERIL) 10 MG tablet TAKE 1 TABLET(10 MG) BY MOUTH THREE TIMES DAILY AS NEEDED FOR MUSCLE SPASMS   ezetimibe (ZETIA) 10 MG tablet TAKE 1 TABLET BY MOUTH DAILY.   fluticasone (FLONASE) 50 MCG/ACT nasal spray PLACE 2 SPRAYS INTO BOTH NOSTRILS DAILY.   hydrALAZINE (APRESOLINE) 25 MG tablet Take 1 tablet (25 mg total) by mouth 3 (three) times daily.   influenza vaccine adjuvanted (FLUAD) 0.5 ML injection Inject into the muscle.   montelukast (SINGULAIR) 10 MG tablet TAKE 1 TABLET(10 MG) BY MOUTH AT BEDTIME Strength: 10 mg   nabumetone (RELAFEN) 500 MG tablet TAKE 1 TABLET(500 MG) BY MOUTH TWICE DAILY AS NEEDED   pantoprazole (PROTONIX) 20 MG tablet TAKE 1 TABLET(20 MG) BY MOUTH DAILY   pravastatin (PRAVACHOL) 40 MG tablet Take 1 tablet (40 mg total) by mouth daily.   No facility-administered encounter medications on file as of 03/22/2022.    Allergies (verified) Cozaar [losartan potassium], Lisinopril, and Statins  History: Past Medical History:  Diagnosis Date   GERD (gastroesophageal reflux disease)    Hyperlipidemia    Hypertension    Osteoarthritis    Past Surgical History:  Procedure Laterality Date   ABDOMINAL HYSTERECTOMY  1987   Family History  Problem Relation Age of Onset   Cancer Father    Hypertension Brother    Diabetes Brother    Diabetes Paternal Grandfather    Diabetes Paternal Uncle    Colon cancer Neg Hx    Hypercalcemia Neg Hx     Social History   Socioeconomic History   Marital status: Legally Separated    Spouse name: Not on file   Number of children: Not on file   Years of education: Not on file   Highest education level: Not on file  Occupational History   Not on file  Tobacco Use   Smoking status: Never   Smokeless tobacco: Never  Substance and Sexual Activity   Alcohol use: No   Drug use: No   Sexual activity: Not Currently  Other Topics Concern   Not on file  Social History Narrative   Not on file   Social Determinants of Health   Financial Resource Strain: Low Risk  (03/22/2022)   Overall Financial Resource Strain (CARDIA)    Difficulty of Paying Living Expenses: Not hard at all  Food Insecurity: No Food Insecurity (03/22/2022)   Hunger Vital Sign    Worried About Running Out of Food in the Last Year: Never true    Ran Out of Food in the Last Year: Never true  Transportation Needs: No Transportation Needs (03/22/2022)   PRAPARE - Administrator, Civil ServiceTransportation    Lack of Transportation (Medical): No    Lack of Transportation (Non-Medical): No  Physical Activity: Inactive (03/22/2022)   Exercise Vital Sign    Days of Exercise per Week: 0 days    Minutes of Exercise per Session: 0 min  Stress: No Stress Concern Present (03/22/2022)   [employer] - Occupational Stress Questionnaire    Feeling of Stress : Not at all  Social Connections: Moderately Integrated (03/22/2022)   Social Connection and Isolation Panel [NHANES]    Frequency of Communication with Friends and Family: More than three times a week    Frequency of Social Gatherings with Friends and Family: More than three times a week    Attends Religious Services: More than 4 times per year    Active Member of [employer]ubs or Organizations: No    Attends Engineer, structuralClub or Organization Meetings: More than 4 times per year    Marital Status: Separated    Tobacco Counseling Counseling given: Not Answered   Clinical Intake:  Pre-visit  preparation completed: Yes  Pain : No/denies pain Pain Score: 0-No pain     BMI - recorded: 36.73 Nutritional Status: BMI > 30  Obese Nutritional Risks: None Diabetes: No  How often do you need to have someone help you when you read instructions, pamphlets, or other written materials from your doctor or pharmacy?: 1 - Never What is the last grade level you completed in school?: HSG  Diabetic? no  Interpreter Needed?: No  Information entered by :: Susie CassetteShenika Vertis Scheib, LPN.   Activities of Daily Living    03/22/2022    3:10 PM  In your present state of health, do you have any difficulty performing the following activities:  Hearing? 0  Vision? 0  Difficulty concentrating or making decisions? 0  Walking or climbing stairs? 0  Dressing or bathing? 0  Doing errands, shopping? 0  Preparing Food and eating ? N  Using the Toilet? N  In the past six months, have you accidently leaked urine? N  Do you have problems with loss of bowel control? N  Managing your Medications? N  Managing your Finances? N  Housekeeping or managing your Housekeeping? N    Patient Care Team: Etta Grandchild, MD as PCP - General (Internal Medicine) Norva Pavlov, OD as Consulting Physician (Optometry) Szabat, Vinnie Level, [facility] (Inactive) as Pharmacist (Pharmacist)  Indicate any recent Medical Services you may have received from other than [facility] providers in the past year (date may be approximate).     Assessment:   This is a routine wellness examination for Kayla Lowe.  Hearing/Vision screen Hearing Screening - Comments:: Denies hearing difficulties   Vision Screening - Comments:: Wears readers glasses - up to date with routine eye exams with Ritesh Poudyal, OD. Cataracts removed and had lens implants.  Dietary issues and exercise activities discussed: Current Exercise Habits: Home exercise routine, Type of exercise: walking, Time (Minutes): 30, Frequency (Times/Week): 5, Weekly Exercise  (Minutes/Week): 150, Intensity: Moderate, Exercise limited by: orthopedic condition(s)   Goals Addressed             This Visit's Progress    My goal is to lose 4o lbs, watch my sweet intake, drink more water and prevent falls.        Depression Screen    03/22/2022    3:10 PM 01/17/2022    8:19 AM 07/11/2021    8:03 AM 04/18/2020    8:53 AM 09/16/2019    8:18 AM 11/04/2018    8:44 AM 07/29/2018    8:37 AM  PHQ 2/9 Scores  PHQ - 2 Score 0 0 0 0 0 0 0  PHQ- 9 Score  0         Fall Risk    03/22/2022    3:06 PM 01/17/2022    8:18 AM 07/11/2021    8:02 AM 04/18/2020    8:55 AM 09/16/2019    8:18 AM  Fall Risk   Falls in the past year? 0 0 0 0 0  Number falls in past yr: 0 0  0 0  Injury with Fall? 0 0  0 0  Risk for fall due to : No Fall Risks No Fall Risks  No Fall Risks No Fall Risks  Follow up Falls prevention discussed Falls evaluation completed  Falls evaluation completed Falls evaluation completed    FALL RISK PREVENTION PERTAINING TO THE HOME:  Any stairs in or around the home? No  If so, are there any without handrails? No  Home free of loose throw rugs in walkways, pet beds, electrical cords, etc? Yes  Adequate lighting in your home to reduce risk of falls? Yes   ASSISTIVE DEVICES UTILIZED TO PREVENT FALLS:  Life alert? No  Use of a cane, walker or w/c? No  Grab bars in the bathroom? No  Shower chair or bench in shower? No  Elevated toilet seat or a handicapped toilet? No   TIMED UP AND GO:  Was the test performed? No . Phone Visit   Cognitive Function:        03/22/2022    3:07 PM 04/18/2020    8:58 AM  6CIT Screen  What Year? 0 points 0 points  What month? 0 points 0 points  What time? 0 points 0 points  Count back from 20 0 points  0 points  Months in reverse 0 points 0 points  Repeat phrase 0 points 0 points  Total Score 0 points 0 points    Immunizations Immunization History  Administered Date(s) Administered   COVID-19, mRNA,  vaccine(Comirnaty)12 years and older 02/27/2022   Fluad Quad(high Dose 65+) 03/14/2020, 02/27/2022   Influenza,inj,Quad PF,6+ Mos 02/17/2014, 03/24/2015, 02/06/2016, 01/18/2017, 03/27/2018, 02/09/2019   Influenza-Unspecified 05/11/2021   PFIZER Comirnaty(Gray Top)Covid-19 Tri-Sucrose Vaccine 09/14/2020, 02/27/2022   PFIZER(Purple Top)SARS-COV-2 Vaccination 07/16/2019, 08/12/2019, 03/12/2020   Pfizer Covid-19 Vaccine Bivalent Booster 28yrs & up 05/11/2021   Pneumococcal Conjugate-13 07/29/2018   Pneumococcal Polysaccharide-23 02/17/2014, 09/16/2019   Tdap 08/03/2015    TDAP status: Up to date  Flu Vaccine status: Up to date  Pneumococcal vaccine status: Up to date  Covid-19 vaccine status: Completed vaccines  Qualifies for Shingles Vaccine? Yes   Zostavax completed No   Shingrix Completed?: No.    Education has been provided regarding the importance of this vaccine. Patient has been advised to call insurance company to determine out of pocket expense if they have not yet received this vaccine. Advised may also receive vaccine at local pharmacy or Health Dept. Verbalized acceptance and understanding.  Screening Tests Health Maintenance  Topic Date Due   Zoster Vaccines- Shingrix (1 of 2) Never done   COVID-19 Vaccine (6 - Pfizer series) 06/30/2022   MAMMOGRAM  01/05/2023   Medicare Annual Wellness (AWV)  03/23/2023   COLONOSCOPY (Pts 45-40yrs Insurance coverage will need to be confirmed)  01/21/2024   TETANUS/TDAP  08/02/2025   Pneumonia Vaccine 45+ Years old  Completed   INFLUENZA VACCINE  Completed   DEXA SCAN  Completed   Hepatitis C Screening  Completed   HPV VACCINES  Aged Out    Health Maintenance  Health Maintenance Due  Topic Date Due   Zoster Vaccines- Shingrix (1 of 2) Never done    Colorectal cancer screening: Type of screening: Colonoscopy. Completed 01/20/2014. Repeat every 10 years  Mammogram status: Completed 01/04/2022. Repeat every year  Bone Density  status: Ordered 03/22/2022. Pt provided with contact info and advised to call to schedule appt.  Lung Cancer Screening: (Low Dose CT Chest recommended if Age 32-80 years, 30 pack-year currently smoking OR have quit w/in 15years.) does not qualify.   Lung Cancer Screening Referral: no  Additional Screening:  Hepatitis C Screening: does qualify; Completed 02/06/2016  Vision Screening: Recommended annual ophthalmology exams for early detection of glaucoma and other disorders of the eye. Is the patient up to date with their annual eye exam?  Yes  Who is the provider or what is the name of the office in which the patient attends annual eye exams? Mack Hook, MD and [facility], OD. If pt is not established with a provider, would they like to be referred to a provider to establish care? No .   Dental Screening: Recommended annual dental exams for proper oral hygiene  Community Resource Referral / Chronic Care Management: CRR required this visit?  No   CCM required this visit?  No      Plan:     I have personally reviewed and noted the following in the patient's chart:   Medical and social history Use of alcohol, tobacco or illicit drugs  Current medications and supplements including opioid prescriptions. Patient is not currently taking opioid prescriptions. Functional ability and status Nutritional status Physical activity Advanced directives List of other physicians Hospitalizations, surgeries, and ER visits in previous 12 months Vitals Screenings to include  cognitive, depression, and falls Referrals and appointments  In addition, I have reviewed and discussed with patient certain preventive protocols, quality metrics, and best practice recommendations. A written personalized care plan for preventive services as well as general preventive health recommendations were provided to patient.     Mickeal Needy, LPN   [id number]/08/[id number]   Nurse Notes: N/A

## 2022-03-22 NOTE — Patient Instructions (Addendum)
[name] , Thank you for taking time to come for your Medicare Wellness Visit. I appreciate your ongoing commitment to your health goals. Please review the following plan we discussed and let me know if I can assist you in the future.   These are the goals we discussed:  Goals      My goal is to lose 4o lbs, watch my sweet intake, drink more water and prevent falls.        This is a list of the screening recommended for you and due dates:  Health Maintenance  Topic Date Due   Zoster (Shingles) Vaccine (1 of 2) Never done   COVID-19 Vaccine (6 - Pfizer series) 06/30/2022   Mammogram  01/05/2023   Medicare Annual Wellness Visit  03/23/2023   Colon Cancer Screening  01/21/2024   Tetanus Vaccine  08/02/2025   Pneumonia Vaccine  Completed   Flu Shot  Completed   DEXA scan (bone density measurement)  Completed   Hepatitis C Screening: USPSTF Recommendation to screen - Ages 18-79 yo.  Completed   HPV Vaccine  Aged Out    Advanced directives: NO  Conditions/risks identified: YES  Next appointment: Follow up in one year for your annual wellness visit please call [phone] to schedule your next annual wellness visit with Nurse Percell Miller via phone or in the office.   Preventive Care 45 Years and Older, Female Preventive care refers to lifestyle choices and visits with your health care provider that can promote health and wellness. What does preventive care include? A yearly physical exam. This is also called an annual well check. Dental exams once or twice a year. Routine eye exams. Ask your health care provider how often you should have your eyes checked. Personal lifestyle choices, including: Daily care of your teeth and gums. Regular physical activity. Eating a healthy diet. Avoiding tobacco and drug use. Limiting alcohol use. Practicing safe sex. Taking low-dose aspirin every day. Taking vitamin and mineral supplements as recommended by your health care  provider. What happens during an annual well check? The services and screenings done by your health care provider during your annual well check will depend on your age, overall health, lifestyle risk factors, and family history of disease. Counseling  Your health care provider may ask you questions about your: Alcohol use. Tobacco use. Drug use. Emotional well-being. Home and relationship well-being. Sexual activity. Eating habits. History of falls. Memory and ability to understand (cognition). Work and work Astronomer. Reproductive health. Screening  You may have the following tests or measurements: Height, weight, and BMI. Blood pressure. Lipid and cholesterol levels. These may be checked every 5 years, or more frequently if you are over 19 years old. Skin check. Lung cancer screening. You may have this screening every year starting at age 7 if you have a 30-pack-year history of smoking and currently smoke or have quit within the past 15 years. Fecal occult blood test (FOBT) of the stool. You may have this test every year starting at age 35. Flexible sigmoidoscopy or colonoscopy. You may have a sigmoidoscopy every 5 years or a colonoscopy every 10 years starting at age 74. Hepatitis C blood test. Hepatitis B blood test. Sexually transmitted disease (STD) testing. Diabetes screening. This is done by checking your blood sugar (glucose) after you have not eaten for a while (fasting). You may have this done every 1-3 years. Bone density scan. This is done to screen for osteoporosis. You may have this done starting at age 71.  Mammogram. This may be done every 1-2 years. Talk to your health care provider about how often you should have regular mammograms. Talk with your health care provider about your test results, treatment options, and if necessary, the need for more tests. Vaccines  Your health care provider may recommend certain vaccines, such as: Influenza vaccine. This is  recommended every year. Tetanus, diphtheria, and acellular pertussis (Tdap, Td) vaccine. You may need a Td booster every 10 years. Zoster vaccine. You may need this after age 27. Pneumococcal 13-valent conjugate (PCV13) vaccine. One dose is recommended after age 66. Pneumococcal polysaccharide (PPSV23) vaccine. One dose is recommended after age 65. Talk to your health care provider about which screenings and vaccines you need and how often you need them. This information is not intended to replace advice given to you by your health care provider. Make sure you discuss any questions you have with your health care provider. Document Released: 05/27/2015 Document Revised: 01/18/2016 Document Reviewed: 03/01/2015 Elsevier Interactive Patient Education  2017 [place] Prevention in the Home Falls can cause injuries. They can happen to people of all ages. There are many things you can do to make your home safe and to help prevent falls. What can I do on the outside of my home? Regularly fix the edges of walkways and driveways and fix any cracks. Remove anything that might make you trip as you walk through a door, such as a raised step or threshold. Trim any bushes or trees on the path to your home. Use bright outdoor lighting. Clear any walking paths of anything that might make someone trip, such as rocks or tools. Regularly check to see if handrails are loose or broken. Make sure that both sides of any steps have handrails. Any raised decks and porches should have guardrails on the edges. Have any leaves, snow, or ice cleared regularly. Use sand or salt on walking paths during winter. Clean up any spills in your garage right away. This includes oil or grease spills. What can I do in the bathroom? Use night lights. Install grab bars by the toilet and in the tub and shower. Do not use towel bars as grab bars. Use non-skid mats or decals in the tub or shower. If you need to sit down in  the shower, use a plastic, non-slip stool. Keep the floor dry. Clean up any water that spills on the floor as soon as it happens. Remove soap buildup in the tub or shower regularly. Attach bath mats securely with double-sided non-slip rug tape. Do not have throw rugs and other things on the floor that can make you trip. What can I do in the bedroom? Use night lights. Make sure that you have a light by your bed that is easy to reach. Do not use any sheets or blankets that are too big for your bed. They should not hang down onto the floor. Have a firm chair that has side arms. You can use this for support while you get dressed. Do not have throw rugs and other things on the floor that can make you trip. What can I do in the kitchen? Clean up any spills right away. Avoid walking on wet floors. Keep items that you use a lot in easy-to-reach places. If you need to reach something above you, use a strong step stool that has a grab bar. Keep electrical cords out of the way. Do not use floor polish or wax that makes floors slippery. If  you must use wax, use non-skid floor wax. Do not have throw rugs and other things on the floor that can make you trip. What can I do with my stairs? Do not leave any items on the stairs. Make sure that there are handrails on both sides of the stairs and use them. Fix handrails that are broken or loose. Make sure that handrails are as long as the stairways. Check any carpeting to make sure that it is firmly attached to the stairs. Fix any carpet that is loose or worn. Avoid having throw rugs at the top or bottom of the stairs. If you do have throw rugs, attach them to the floor with carpet tape. Make sure that you have a light switch at the top of the stairs and the bottom of the stairs. If you do not have them, ask someone to add them for you. What else can I do to help prevent falls? Wear shoes that: Do not have high heels. Have rubber bottoms. Are comfortable  and fit you well. Are closed at the toe. Do not wear sandals. If you use a stepladder: Make sure that it is fully opened. Do not climb a closed stepladder. Make sure that both sides of the stepladder are locked into place. Ask someone to hold it for you, if possible. Clearly mark and make sure that you can see: Any grab bars or handrails. First and last steps. Where the edge of each step is. Use tools that help you move around (mobility aids) if they are needed. These include: Canes. Walkers. Scooters. Crutches. Turn on the lights when you go into a dark area. Replace any light bulbs as soon as they burn out. Set up your furniture so you have a clear path. Avoid moving your furniture around. If any of your floors are uneven, fix them. If there are any pets around you, be aware of where they are. Review your medicines with your doctor. Some medicines can make you feel dizzy. This can increase your chance of falling. Ask your doctor what other things that you can do to help prevent falls. This information is not intended to replace advice given to you by your health care provider. Make sure you discuss any questions you have with your health care provider. Document Released: 02/24/2009 Document Revised: 10/06/2015 Document Reviewed: 06/04/2014 Elsevier Interactive Patient Education  2017 [employer].

## 2022-03-31 ENCOUNTER — Other Ambulatory Visit: Payer: Self-pay | Admitting: Internal Medicine

## 2022-03-31 DIAGNOSIS — E785 Hyperlipidemia, unspecified: Secondary | ICD-10-CM

## 2022-03-31 DIAGNOSIS — J301 Allergic rhinitis due to pollen: Secondary | ICD-10-CM

## 2022-04-18 ENCOUNTER — Encounter: Payer: Self-pay | Admitting: Internal Medicine

## 2022-04-18 ENCOUNTER — Ambulatory Visit (INDEPENDENT_AMBULATORY_CARE_PROVIDER_SITE_OTHER): Payer: Medicare Other | Admitting: Internal Medicine

## 2022-04-18 VITALS — BP 148/84 | HR 77 | Temp 97.8°F | Resp 16 | Ht 64.0 in | Wt 211.0 lb

## 2022-04-18 DIAGNOSIS — I10 Essential (primary) hypertension: Secondary | ICD-10-CM | POA: Diagnosis not present

## 2022-04-18 DIAGNOSIS — N1831 Chronic kidney disease, stage 3a: Secondary | ICD-10-CM | POA: Diagnosis not present

## 2022-04-18 DIAGNOSIS — I119 Hypertensive heart disease without heart failure: Secondary | ICD-10-CM | POA: Diagnosis not present

## 2022-04-18 DIAGNOSIS — E7801 Familial hypercholesterolemia: Secondary | ICD-10-CM | POA: Diagnosis not present

## 2022-04-18 DIAGNOSIS — Z23 Encounter for immunization: Secondary | ICD-10-CM

## 2022-04-18 DIAGNOSIS — E559 Vitamin D deficiency, unspecified: Secondary | ICD-10-CM | POA: Diagnosis not present

## 2022-04-18 LAB — LIPID PANEL
Cholesterol: 187 mg/dL (ref 0–200)
HDL: 54 mg/dL (ref >39.00)
LDL Cholesterol: 108 mg/dL — ABNORMAL HIGH (ref 0–99)
NonHDL: 132.96
Total CHOL/HDL Ratio: 3
Triglycerides: 127 mg/dL (ref 0.0–149.0)
VLDL: 25.4 mg/dL (ref 0.0–40.0)

## 2022-04-18 LAB — HEPATIC FUNCTION PANEL
ALT: 14 U/L (ref 0–35)
AST: 18 U/L (ref 0–37)
Albumin: 4 g/dL (ref 3.5–5.2)
Alkaline Phosphatase: 99 U/L (ref 39–117)
Bilirubin, Direct: 0.1 mg/dL (ref 0.0–0.3)
Total Bilirubin: 0.4 mg/dL (ref 0.2–1.2)
Total Protein: 7.1 g/dL (ref 6.0–8.3)

## 2022-04-18 LAB — TSH: TSH: 1.2 u[IU]/mL (ref 0.35–5.50)

## 2022-04-18 LAB — VITAMIN D 25 HYDROXY (VIT D DEFICIENCY, FRACTURES): VITD: 37.04 ng/mL (ref 30.00–100.00)

## 2022-04-18 MED ORDER — SHINGRIX 50 MCG/0.5ML IM SUSR: 0.5000 mL | Freq: Once | INTRAMUSCULAR | 1 refills | Status: AC

## 2022-04-18 MED ORDER — HYDRALAZINE HCL 25 MG PO TABS: 25.0000 mg | ORAL_TABLET | Freq: Three times a day (TID) | ORAL | 0 refills | Status: DC

## 2022-04-18 NOTE — Progress Notes (Signed)
Subjective:  Patient ID: Kayla Lowe, female    DOB: 1956-01-26  Age: 66 y.o. MRN: [id number]  CC: Hypertension   HPI Kayla Lowe presents for f/up -  She is active and denies DOE, SOB, CP, edema.  Outpatient Medications Prior to Visit  Medication Sig Dispense Refill   amLODipine (NORVASC) 10 MG tablet TAKE 1 TABLET(10 MG) BY MOUTH DAILY 90 tablet 1   aspirin EC 81 MG tablet Take 81 mg by mouth daily. Swallow whole.     benzonatate (TESSALON) 100 MG capsule Take 1 capsule (100 mg total) by mouth 2 (two) times daily as needed for cough. 20 capsule 0   carvedilol (COREG) 6.25 MG tablet TAKE 1 TABLET(6.25 MG) BY MOUTH TWICE DAILY WITH A MEAL 180 tablet 1   cetirizine (ZYRTEC) 10 MG tablet Take 10 mg by mouth daily.     Cholecalciferol (VITAMIN D) 50 MCG (2000 UT) CAPS Take 1 capsule by mouth daily.     COVID-19 mRNA vaccine 2023-2024 (COMIRNATY) syringe Inject into the muscle. 0.3 mL 0   cyclobenzaprine (FLEXERIL) 10 MG tablet TAKE 1 TABLET(10 MG) BY MOUTH THREE TIMES DAILY AS NEEDED FOR MUSCLE SPASMS 30 tablet 2   ezetimibe (ZETIA) 10 MG tablet TAKE 1 TABLET BY MOUTH DAILY. 90 tablet 1   fluticasone (FLONASE) 50 MCG/ACT nasal spray PLACE 2 SPRAYS INTO BOTH NOSTRILS DAILY. 16 g 2   influenza vaccine adjuvanted (FLUAD) 0.5 ML injection Inject into the muscle. 0.5 mL 0   montelukast (SINGULAIR) 10 MG tablet TAKE 1 TABLET BY MOUTH EVERY NIGHT AT BEDTIME. 90 tablet 1   nabumetone (RELAFEN) 500 MG tablet TAKE 1 TABLET(500 MG) BY MOUTH TWICE DAILY AS NEEDED 180 tablet 1   pantoprazole (PROTONIX) 20 MG tablet TAKE 1 TABLET(20 MG) BY MOUTH DAILY 90 tablet 1   pravastatin (PRAVACHOL) 40 MG tablet TAKE 1 TABLET(40 MG) BY MOUTH DAILY 90 tablet 1   hydrALAZINE (APRESOLINE) 25 MG tablet Take 1 tablet (25 mg total) by mouth 3 (three) times daily. 270 tablet 0   No facility-administered medications prior to visit.    ROS Review of Systems  Constitutional: Negative.   Negative for diaphoresis, fatigue and unexpected weight change.  HENT: Negative.    Eyes: Negative.   Respiratory: Negative.  Negative for cough, chest tightness, shortness of breath and wheezing.   Cardiovascular:  Negative for chest pain, palpitations and leg swelling.  Gastrointestinal:  Negative for abdominal pain, constipation, diarrhea, nausea and vomiting.  Endocrine: Negative.   Genitourinary: Negative.  Negative for difficulty urinating.  Musculoskeletal: Negative.   Skin: Negative.   Neurological: Negative.  Negative for dizziness and light-headedness.  Hematological:  Negative for adenopathy. Does not bruise/bleed easily.  Psychiatric/Behavioral: Negative.      Objective:  BP (!) 148/84 (BP Location: Left Arm, Patient Position: Sitting, Cuff Size: Large)   Pulse 77   Temp 97.8 F (36.6 C) (Oral)   Resp 16   Ht 5\' 4"  (1.626 m)   Wt 211 lb (95.7 kg)   SpO2 97%   BMI 36.22 kg/m   BP Readings from Last 3 Encounters:  04/18/22 (!) 148/84  01/25/22 128/85  01/17/22 (!) 160/80    Wt Readings from Last 3 Encounters:  04/18/22 211 lb (95.7 kg)  03/22/22 214 lb (97.1 kg)  01/17/22 214 lb (97.1 kg)    Physical Exam Vitals reviewed.  Eyes:     General: No scleral icterus.    Pupils: Pupils are equal, round, and reactive to  light.  Cardiovascular:     Rate and Rhythm: Normal rate and regular rhythm.     Heart sounds: No murmur heard. Pulmonary:     Effort: Pulmonary effort is normal.     Breath sounds: No stridor. No wheezing, rhonchi or rales.  Abdominal:     General: Abdomen is flat.     Palpations: There is no mass.     Tenderness: There is no abdominal tenderness. There is no guarding.     Hernia: No hernia is present.  Musculoskeletal:        General: Normal range of motion.     Cervical back: Neck supple.     Right lower leg: No edema.     Left lower leg: No edema.  Lymphadenopathy:     Cervical: No cervical adenopathy.  Skin:    General: Skin is  warm and dry.  Neurological:     General: No focal deficit present.     Mental Status: She is alert.  Psychiatric:        Mood and Affect: Mood normal.        Behavior: Behavior normal.     Lab Results  Component Value Date   WBC 5.6 07/11/2021   HGB 12.8 07/11/2021   HCT 38.7 07/11/2021   PLT 321.0 07/11/2021   GLUCOSE 92 01/17/2022   CHOL 187 04/18/2022   TRIG 127.0 04/18/2022   HDL 54.00 04/18/2022   LDLDIRECT 220.0 09/16/2019   LDLCALC 108 (H) 04/18/2022   ALT 14 04/18/2022   AST 18 04/18/2022   NA 142 01/17/2022   K 4.1 01/17/2022   CL 106 01/17/2022   CREATININE 1.04 01/17/2022   BUN 13 01/17/2022   CO2 28 01/17/2022   TSH 1.20 04/18/2022   INR 0.97 07/10/2013   HGBA1C 5.4 08/31/2019    MM 3D SCREEN BREAST BILATERAL  Result Date: 01/05/2022 CLINICAL DATA:  Screening. EXAM: DIGITAL SCREENING BILATERAL MAMMOGRAM WITH TOMOSYNTHESIS AND CAD TECHNIQUE: Bilateral screening digital craniocaudal and mediolateral oblique mammograms were obtained. Bilateral screening digital breast tomosynthesis was performed. The images were evaluated with computer-aided detection. COMPARISON:  Previous exam(s). ACR Breast Density Category b: There are scattered areas of fibroglandular density. FINDINGS: There are no findings suspicious for malignancy. IMPRESSION: No mammographic evidence of malignancy. A result letter of this screening mammogram will be mailed directly to the patient. RECOMMENDATION: Screening mammogram in one year. (Code:SM-B-01Y) BI-RADS CATEGORY  1: Negative. Electronically Signed   By: Kristopher Oppenheim M.D.   On: 01/05/2022 08:49    Assessment & Plan:   Kayla Lowe was seen today for hypertension.  Diagnoses and all orders for this visit:  Stage 3a chronic kidney disease ([place]) - Renal function has been stable.  Essential hypertension, benign- Her BP is not at the goal of 103/80, she will continue working on her lifestyle modifications. -     hydrALAZINE (APRESOLINE) 25  MG tablet; Take 1 tablet (25 mg total) by mouth 3 (three) times daily. -     TSH; Future -     TSH  LVH (left ventricular hypertrophy) due to hypertensive disease, without heart failure -     hydrALAZINE (APRESOLINE) 25 MG tablet; Take 1 tablet (25 mg total) by mouth 3 (three) times daily.  Familial hypercholesteremia - LDL goal achieved. Doing well on the statin  -     Lipid panel; Future -     TSH; Future -     Hepatic function panel; Future -     Hepatic  function panel -     TSH -     Lipid panel  Vitamin D deficiency disease -     VITAMIN D 25 Hydroxy (Vit-D Deficiency, Fractures); Future -     VITAMIN D 25 Hydroxy (Vit-D Deficiency, Fractures)  Need for prophylactic vaccination and inoculation against varicella -     Zoster Vaccine Adjuvanted [facility]) injection; Inject 0.5 mLs into the muscle once for 1 dose.   I am having Kayla Lowe start on Shingrix. I am also having her maintain her fluticasone, aspirin EC, cetirizine, Vitamin D, benzonatate, amLODipine, carvedilol, cyclobenzaprine, nabumetone, influenza vaccine adjuvanted, Comirnaty, pantoprazole, ezetimibe, pravastatin, montelukast, and hydrALAZINE.  Meds ordered this encounter  Medications   hydrALAZINE (APRESOLINE) 25 MG tablet    Sig: Take 1 tablet (25 mg total) by mouth 3 (three) times daily.    Dispense:  270 tablet    Refill:  0   Zoster Vaccine Adjuvanted [facility]) injection    Sig: Inject 0.5 mLs into the muscle once for 1 dose.    Dispense:  0.5 mL    Refill:  1     Follow-up: Return in about 6 months (around 10/18/2022).  Sanda Linger, MD

## 2022-04-18 NOTE — Patient Instructions (Signed)
Hypertension, Adult High blood pressure (hypertension) is when the force of blood pumping through the arteries is too strong. The arteries are the blood vessels that carry blood from the heart throughout the body. Hypertension forces the heart to work harder to pump blood and may cause arteries to become narrow or stiff. Untreated or uncontrolled hypertension can lead to a heart attack, heart failure, a stroke, kidney disease, and other problems. A blood pressure reading consists of a higher number over a lower number. Ideally, your blood pressure should be below 120/80. The first ("top") number is called the systolic pressure. It is a measure of the pressure in your arteries as your heart beats. The second ("bottom") number is called the diastolic pressure. It is a measure of the pressure in your arteries as the heart relaxes. What are the causes? The exact cause of this condition is not known. There are some conditions that result in high blood pressure. What increases the risk? Certain factors may make you more likely to develop high blood pressure. Some of these risk factors are under your control, including: Smoking. Not getting enough exercise or physical activity. Being overweight. Having too much fat, sugar, calories, or salt (sodium) in your diet. Drinking too much alcohol. Other risk factors include: Having a personal history of heart disease, diabetes, high cholesterol, or kidney disease. Stress. Having a family history of high blood pressure and high cholesterol. Having obstructive sleep apnea. Age. The risk increases with age. What are the signs or symptoms? High blood pressure may not cause symptoms. Very high blood pressure (hypertensive crisis) may cause: Headache. Fast or irregular heartbeats (palpitations). Shortness of breath. Nosebleed. Nausea and vomiting. Vision changes. Severe chest pain, dizziness, and seizures. How is this diagnosed? This condition is diagnosed by  measuring your blood pressure while you are seated, with your arm resting on a flat surface, your legs uncrossed, and your feet flat on the floor. The cuff of the blood pressure monitor will be placed directly against the skin of your upper arm at the level of your heart. Blood pressure should be measured at least twice using the same arm. Certain conditions can cause a difference in blood pressure between your right and left arms. If you have a high blood pressure reading during one visit or you have normal blood pressure with other risk factors, you may be asked to: Return on a different day to have your blood pressure checked again. Monitor your blood pressure at home for 1 week or longer. If you are diagnosed with hypertension, you may have other blood or imaging tests to help your health care provider understand your overall risk for other conditions. How is this treated? This condition is treated by making healthy lifestyle changes, such as eating healthy foods, exercising more, and reducing your alcohol intake. You may be referred for counseling on a healthy diet and physical activity. Your health care provider may prescribe medicine if lifestyle changes are not enough to get your blood pressure under control and if: Your systolic blood pressure is above 130. Your diastolic blood pressure is above 80. Your personal target blood pressure may vary depending on your medical conditions, your age, and other factors. Follow these instructions at home: Eating and drinking  Eat a diet that is high in fiber and potassium, and low in sodium, added sugar, and fat. An example of this eating plan is called the DASH diet. DASH stands for Dietary Approaches to Stop Hypertension. To eat this way: Eat   plenty of fresh fruits and vegetables. Try to fill one half of your plate at each meal with fruits and vegetables. Eat whole grains, such as whole-wheat pasta, brown rice, or whole-grain bread. Fill about one  fourth of your plate with whole grains. Eat or drink low-fat dairy products, such as skim milk or low-fat yogurt. Avoid fatty cuts of meat, processed or cured meats, and poultry with skin. Fill about one fourth of your plate with lean proteins, such as fish, chicken without skin, beans, eggs, or tofu. Avoid pre-made and processed foods. These tend to be higher in sodium, added sugar, and fat. Reduce your daily sodium intake. Many people with hypertension should eat less than 1,500 mg of sodium a day. Do not drink alcohol if: Your health care provider tells you not to drink. You are pregnant, may be pregnant, or are planning to become pregnant. If you drink alcohol: Limit how much you have to: 0-1 drink a day for women. 0-2 drinks a day for men. Know how much alcohol is in your drink. In the [place] one drink equals one 12 oz bottle of beer (355 mL), one 5 oz glass of wine (148 mL), or one 1 oz glass of hard liquor (44 mL). Lifestyle  Work with your health care provider to maintain a healthy body weight or to lose weight. Ask what an ideal weight is for you. Get at least 30 minutes of exercise that causes your heart to beat faster (aerobic exercise) most days of the week. Activities may include walking, swimming, or biking. Include exercise to strengthen your muscles (resistance exercise), such as Pilates or lifting weights, as part of your weekly exercise routine. Try to do these types of exercises for 30 minutes at least 3 days a week. Do not use any products that contain nicotine or tobacco. These products include cigarettes, chewing tobacco, and vaping devices, such as e-cigarettes. If you need help quitting, ask your health care provider. Monitor your blood pressure at home as told by your health care provider. Keep all follow-up visits. This is important. Medicines Take over-the-counter and prescription medicines only as told by your health care provider. Follow directions carefully. Blood  pressure medicines must be taken as prescribed. Do not skip doses of blood pressure medicine. Doing this puts you at risk for problems and can make the medicine less effective. Ask your health care provider about side effects or reactions to medicines that you should watch for. Contact a health care provider if you: Think you are having a reaction to a medicine you are taking. Have headaches that keep coming back (recurring). Feel dizzy. Have swelling in your ankles. Have trouble with your vision. Get help right away if you: Develop a severe headache or confusion. Have unusual weakness or numbness. Feel faint. Have severe pain in your chest or abdomen. Vomit repeatedly. Have trouble breathing. These symptoms may be an emergency. Get help right away. Call 911. Do not wait to see if the symptoms will go away. Do not drive yourself to the hospital. Summary Hypertension is when the force of blood pumping through your arteries is too strong. If this condition is not controlled, it may put you at risk for serious complications. Your personal target blood pressure may vary depending on your medical conditions, your age, and other factors. For most people, a normal blood pressure is less than 120/80. Hypertension is treated with lifestyle changes, medicines, or a combination of both. Lifestyle changes include losing weight, eating a healthy,   low-sodium diet, exercising more, and limiting alcohol. This information is not intended to replace advice given to you by your health care provider. Make sure you discuss any questions you have with your health care provider. Document Revised: 03/07/2021 Document Reviewed: 03/07/2021 Elsevier Patient Education  2023 Elsevier Inc.  

## 2022-05-23 ENCOUNTER — Other Ambulatory Visit: Payer: Self-pay | Admitting: Internal Medicine

## 2022-05-23 DIAGNOSIS — I10 Essential (primary) hypertension: Secondary | ICD-10-CM

## 2022-05-23 DIAGNOSIS — I119 Hypertensive heart disease without heart failure: Secondary | ICD-10-CM

## 2022-06-09 ENCOUNTER — Other Ambulatory Visit: Payer: Self-pay | Admitting: Internal Medicine

## 2022-06-09 DIAGNOSIS — K219 Gastro-esophageal reflux disease without esophagitis: Secondary | ICD-10-CM

## 2022-06-23 ENCOUNTER — Telehealth: Payer: Medicare Other | Admitting: Nurse Practitioner

## 2022-06-23 DIAGNOSIS — J069 Acute upper respiratory infection, unspecified: Secondary | ICD-10-CM

## 2022-06-23 MED ORDER — AMOXICILLIN-POT CLAVULANATE 875-125 MG PO TABS: 1.0000 | ORAL_TABLET | Freq: Two times a day (BID) | ORAL | 0 refills | Status: DC

## 2022-06-23 MED ORDER — PROMETHAZINE-DM 6.25-15 MG/5ML PO SYRP: 5.0000 mL | ORAL_SOLUTION | Freq: Four times a day (QID) | ORAL | 0 refills | Status: DC | PRN

## 2022-06-23 NOTE — Progress Notes (Signed)
Virtual Visit Consent   [name], you are scheduled for a virtual visit with [name], FNP, a [facility] provider, today.     Just as with appointments in the office, your consent must be obtained to participate.  Your consent will be active for this visit and any virtual visit you may have with one of our providers in the next 365 days.     If you have a MyChart account, a copy of this consent can be sent to you electronically.  All virtual visits are billed to your insurance company just like a traditional visit in the office.    As this is a virtual visit, video technology does not allow for your provider to perform a traditional examination.  This may limit your provider's ability to fully assess your condition.  If your provider identifies any concerns that need to be evaluated in person or the need to arrange testing (such as labs, EKG, etc.), we will make arrangements to do so.     Although advances in technology are sophisticated, we cannot ensure that it will always work on either your end or our end.  If the connection with a video visit is poor, the visit may have to be switched to a telephone visit.  With either a video or telephone visit, we are not always able to ensure that we have a secure connection.     I need to obtain your verbal consent now.   Are you willing to proceed with your visit today? YES   Jazzmyn Byes has provided verbal consent on 06/23/2022 for a virtual visit (video or telephone).   [name], FNP   Date: 06/23/2022 8:46 AM   Virtual Visit via Video Note   I, [name], connected with Kayla Lowe ([id number], [id number]) on 06/23/22 at  9:00 AM EST by a video-enabled telemedicine application and verified that I am speaking with the correct person using two identifiers.  Location: Patient: Virtual Visit Location Patient: Home Provider: Virtual Visit Location Provider:  Mobile   I discussed the limitations of evaluation and management by telemedicine and the availability of in person appointments. The patient expressed understanding and agreed to proceed.    History of Present Illness: Kayla Lowe is a 67 y.o. who identifies as a female who was assigned female at birth, and is being seen today for cough.  HPI: Aptient was sick with cough and congestion 3 weeks ago. She got better the second week with OTC meds. NOw she is sick again.   Cough This is a new problem. The current episode started in the past 7 days. The cough is Productive of purulent sputum. Associated symptoms include postnasal drip, rhinorrhea and a sore throat. Pertinent negatives include no ear congestion or shortness of breath. Nothing aggravates the symptoms. Risk factors for lung disease include animal exposure. She has tried OTC cough suppressant for the symptoms. The treatment provided mild relief.    Review of Systems  HENT:  Positive for postnasal drip, rhinorrhea and sore throat.   Respiratory:  Positive for cough. Negative for shortness of breath.     Problems:  Patient Active Problem List   Diagnosis Date Noted   LVH (left ventricular hypertrophy) due to hypertensive disease, without heart failure 01/17/2022   Encounter for general adult medical examination with abnormal findings 01/09/2021   Stage 3a chronic kidney disease ([place]) 07/12/2020   Visit for screening mammogram 07/12/2020   Primary osteoarthritis of both knees 07/11/2020  Intrinsic eczema 03/14/2020   Mild persistent asthma without complication [id number]   Vitamin D deficiency disease 09/18/2019   Familial hypercholesteremia 09/16/2019   Morton's neuroma of left foot 07/20/2019   Gastroesophageal reflux disease without esophagitis 02/09/2019   Osteopenia of neck of right femur 02/09/2019   Seasonal allergic rhinitis due to pollen 08/12/2017   Elevated rheumatoid factor 02/06/2016    Dyslipidemia, goal LDL below 130 09/10/2013   Essential hypertension, benign 09/10/2013    Allergies:  Allergies  Allergen Reactions   Cozaar [Losartan Potassium]     Caused cough and sweats. Tingling in legs.   Lisinopril Cough   Statins Other (See Comments)    myalgias   Medications:  Current Outpatient Medications:    amLODipine (NORVASC) 10 MG tablet, TAKE 1 TABLET(10 MG) BY MOUTH DAILY, Disp: 90 tablet, Rfl: 1   aspirin EC 81 MG tablet, Take 81 mg by mouth daily. Swallow whole., Disp: , Rfl:    carvedilol (COREG) 6.25 MG tablet, TAKE 1 TABLET(6.25 MG) BY MOUTH TWICE DAILY WITH A MEAL, Disp: 180 tablet, Rfl: 1   cetirizine (ZYRTEC) 10 MG tablet, Take 10 mg by mouth daily., Disp: , Rfl:    Cholecalciferol (VITAMIN D) 50 MCG (2000 UT) CAPS, Take 1 capsule by mouth daily., Disp: , Rfl:    cyclobenzaprine (FLEXERIL) 10 MG tablet, TAKE 1 TABLET(10 MG) BY MOUTH THREE TIMES DAILY AS NEEDED FOR MUSCLE SPASMS, Disp: 30 tablet, Rfl: 2   ezetimibe (ZETIA) 10 MG tablet, TAKE 1 TABLET BY MOUTH DAILY., Disp: 90 tablet, Rfl: 1   fluticasone (FLONASE) 50 MCG/ACT nasal spray, PLACE 2 SPRAYS INTO BOTH NOSTRILS DAILY., Disp: 16 g, Rfl: 2   hydrALAZINE (APRESOLINE) 25 MG tablet, TAKE 1 TABLET(25 MG) BY MOUTH THREE TIMES DAILY, Disp: 270 tablet, Rfl: 1   montelukast (SINGULAIR) 10 MG tablet, TAKE 1 TABLET BY MOUTH EVERY NIGHT AT BEDTIME., Disp: 90 tablet, Rfl: 1   nabumetone (RELAFEN) 500 MG tablet, TAKE 1 TABLET(500 MG) BY MOUTH TWICE DAILY AS NEEDED, Disp: 180 tablet, Rfl: 1   pantoprazole (PROTONIX) 20 MG tablet, TAKE 1 TABLET(20 MG) BY MOUTH DAILY, Disp: 90 tablet, Rfl: 1   pravastatin (PRAVACHOL) 40 MG tablet, TAKE 1 TABLET(40 MG) BY MOUTH DAILY, Disp: 90 tablet, Rfl: 1  Observations/Objective: Patient is well-developed, well-nourished in no acute distress.  Resting comfortably  at home.  Head is normocephalic, atraumatic.  No labored breathing.  Speech is clear and coherent with logical  content.  Patient is alert and oriented at baseline.  Raspy voice  Deep cough  Assessment and Plan:  [name] in today with chief complaint of Cough   1. URI with cough and congestion 1. Take meds as prescribed 2. Use a cool mist humidifier especially during the winter months and when heat has been humid. 3. Use saline nose sprays frequently 4. Saline irrigations of the nose can be very helpful if done frequently.  * 4X daily for 1 week*  * Use of a nettie pot can be helpful with this. Follow directions with this* 5. Drink plenty of fluids 6. Keep thermostat turn down low 7.For any cough or congestion- promethazine DM 8. For fever or aces or pains- take tylenol or ibuprofen appropriate for age and weight.  * for fevers greater than 101 orally you may alternate ibuprofen and tylenol every  3 hours.    - amoxicillin-clavulanate (AUGMENTIN) 875-125 MG tablet; Take 1 tablet by mouth 2 (two) times daily.  Dispense: 14 tablet; Refill: 0 -  promethazine-dextromethorphan (PROMETHAZINE-DM) 6.25-15 MG/5ML syrup; Take 5 mLs by mouth 4 (four) times daily as needed for cough.  Dispense: 118 mL; Refill: 0    Follow Up Instructions: I discussed the assessment and treatment plan with the patient. The patient was provided an opportunity to ask questions and all were answered. The patient agreed with the plan and demonstrated an understanding of the instructions.  A copy of instructions were sent to the patient via MyChart.  The patient was advised to call back or seek an in-person evaluation if the symptoms worsen or if the condition fails to improve as anticipated.  Time:  I spent 10 minutes with the patient via telehealth technology discussing the above problems/concerns.    [name], FNP

## 2022-06-23 NOTE — Patient Instructions (Signed)
Kayla Lowe, thank you for joining Kayla Pretty, FNP for today's virtual visit.  While this provider is not your primary care provider (PCP), if your PCP is located in our provider database this encounter information will be shared with them immediately following your visit.   [facility] account gives you access to today's visit and all your visits, tests, and labs performed at [facility] " click here if you don't have a [facility] account or go to [email or web address]  Consent: (Patient) Kayla Lowe provided verbal consent for this virtual visit at the beginning of the encounter.  Current Medications:  Current Outpatient Medications:    amoxicillin-clavulanate (AUGMENTIN) 875-125 MG tablet, Take 1 tablet by mouth 2 (two) times daily., Disp: 14 tablet, Rfl: 0   promethazine-dextromethorphan (PROMETHAZINE-DM) 6.25-15 MG/5ML syrup, Take 5 mLs by mouth 4 (four) times daily as needed for cough., Disp: 118 mL, Rfl: 0   amLODipine (NORVASC) 10 MG tablet, TAKE 1 TABLET(10 MG) BY MOUTH DAILY, Disp: 90 tablet, Rfl: 1   aspirin EC 81 MG tablet, Take 81 mg by mouth daily. Swallow whole., Disp: , Rfl:    carvedilol (COREG) 6.25 MG tablet, TAKE 1 TABLET(6.25 MG) BY MOUTH TWICE DAILY WITH A MEAL, Disp: 180 tablet, Rfl: 1   cetirizine (ZYRTEC) 10 MG tablet, Take 10 mg by mouth daily., Disp: , Rfl:    Cholecalciferol (VITAMIN D) 50 MCG (2000 UT) CAPS, Take 1 capsule by mouth daily., Disp: , Rfl:    cyclobenzaprine (FLEXERIL) 10 MG tablet, TAKE 1 TABLET(10 MG) BY MOUTH THREE TIMES DAILY AS NEEDED FOR MUSCLE SPASMS, Disp: 30 tablet, Rfl: 2   ezetimibe (ZETIA) 10 MG tablet, TAKE 1 TABLET BY MOUTH DAILY., Disp: 90 tablet, Rfl: 1   fluticasone (FLONASE) 50 MCG/ACT nasal spray, PLACE 2 SPRAYS INTO BOTH NOSTRILS DAILY., Disp: 16 g, Rfl: 2   hydrALAZINE (APRESOLINE) 25 MG tablet, TAKE 1 TABLET(25 MG) BY MOUTH THREE TIMES DAILY, Disp: 270  tablet, Rfl: 1   montelukast (SINGULAIR) 10 MG tablet, TAKE 1 TABLET BY MOUTH EVERY NIGHT AT BEDTIME., Disp: 90 tablet, Rfl: 1   nabumetone (RELAFEN) 500 MG tablet, TAKE 1 TABLET(500 MG) BY MOUTH TWICE DAILY AS NEEDED, Disp: 180 tablet, Rfl: 1   pantoprazole (PROTONIX) 20 MG tablet, TAKE 1 TABLET(20 MG) BY MOUTH DAILY, Disp: 90 tablet, Rfl: 1   pravastatin (PRAVACHOL) 40 MG tablet, TAKE 1 TABLET(40 MG) BY MOUTH DAILY, Disp: 90 tablet, Rfl: 1   Medications ordered in this encounter:  Meds ordered this encounter  Medications   amoxicillin-clavulanate (AUGMENTIN) 875-125 MG tablet    Sig: Take 1 tablet by mouth 2 (two) times daily.    Dispense:  14 tablet    Refill:  0    Order Specific Question:   Supervising Provider    Answer:   Chase Picket [id number]   promethazine-dextromethorphan (PROMETHAZINE-DM) 6.25-15 MG/5ML syrup    Sig: Take 5 mLs by mouth 4 (four) times daily as needed for cough.    Dispense:  118 mL    Refill:  0    Order Specific Question:   Supervising Provider    Answer:   Chase Picket [id number]     *If you need refills on other medications prior to your next appointment, please contact your pharmacy*  Follow-Up: Call back or seek an in-person evaluation if the symptoms worsen or if the condition fails to improve as anticipated.  [place] [phone]  Other Instructions 1. Take  meds as prescribed 2. Use a cool mist humidifier especially during the winter months and when heat has been humid. 3. Use saline nose sprays frequently 4. Saline irrigations of the nose can be very helpful if done frequently.  * 4X daily for 1 week*  * Use of a nettie pot can be helpful with this. Follow directions with this* 5. Drink plenty of fluids 6. Keep thermostat turn down low 7.For any cough or congestion- promethazine DM 8. For fever or aces or pains- take tylenol or ibuprofen appropriate for age and weight.  * for fevers greater than 101 orally you  may alternate ibuprofen and tylenol every  3 hours.      If you have been instructed to have an in-person evaluation today at a local Urgent Care facility, please use the link below. It will take you to a list of all of our available [facility], including address, phone number and hours of operation. Please do not delay care.  [facility]  If you or a family member do not have a primary care provider, use the link below to schedule a visit and establish care. When you choose a [facility] primary care physician or advanced practice provider, you gain a long-term partner in health. Find a Primary Care Provider  Learn more about [facility]'s in-office and virtual care options: [facility]

## 2022-08-11 ENCOUNTER — Other Ambulatory Visit: Payer: Self-pay | Admitting: Internal Medicine

## 2022-08-11 DIAGNOSIS — I10 Essential (primary) hypertension: Secondary | ICD-10-CM

## 2022-08-21 ENCOUNTER — Other Ambulatory Visit: Payer: Self-pay | Admitting: Internal Medicine

## 2022-08-21 DIAGNOSIS — E785 Hyperlipidemia, unspecified: Secondary | ICD-10-CM

## 2022-08-21 DIAGNOSIS — I10 Essential (primary) hypertension: Secondary | ICD-10-CM

## 2022-08-21 DIAGNOSIS — M170 Bilateral primary osteoarthritis of knee: Secondary | ICD-10-CM

## 2022-08-21 DIAGNOSIS — J301 Allergic rhinitis due to pollen: Secondary | ICD-10-CM

## 2022-09-08 ENCOUNTER — Other Ambulatory Visit: Payer: Self-pay | Admitting: Internal Medicine

## 2022-09-08 DIAGNOSIS — R252 Cramp and spasm: Secondary | ICD-10-CM

## 2022-09-12 ENCOUNTER — Ambulatory Visit
Admission: RE | Admit: 2022-09-12 | Discharge: 2022-09-12 | Disposition: A | Discharge status: Home / Self Care | Payer: Medicare Other | Source: Ambulatory Visit | Attending: Internal Medicine | Admitting: Internal Medicine

## 2022-09-12 DIAGNOSIS — M8588 Other specified disorders of bone density and structure, other site: Secondary | ICD-10-CM | POA: Diagnosis not present

## 2022-09-12 DIAGNOSIS — E349 Endocrine disorder, unspecified: Secondary | ICD-10-CM | POA: Diagnosis not present

## 2022-09-12 DIAGNOSIS — M069 Rheumatoid arthritis, unspecified: Secondary | ICD-10-CM | POA: Diagnosis not present

## 2022-09-12 DIAGNOSIS — N186 End stage renal disease: Secondary | ICD-10-CM | POA: Diagnosis not present

## 2022-09-12 DIAGNOSIS — Z13820 Encounter for screening for osteoporosis: Secondary | ICD-10-CM

## 2022-10-17 ENCOUNTER — Ambulatory Visit (INDEPENDENT_AMBULATORY_CARE_PROVIDER_SITE_OTHER): Payer: Medicare Other | Admitting: Internal Medicine

## 2022-10-17 ENCOUNTER — Encounter: Payer: Self-pay | Admitting: Internal Medicine

## 2022-10-17 VITALS — BP 124/76 | HR 84 | Temp 98.1°F | Ht 64.0 in | Wt 202.0 lb

## 2022-10-17 DIAGNOSIS — L2084 Intrinsic (allergic) eczema: Secondary | ICD-10-CM

## 2022-10-17 DIAGNOSIS — E785 Hyperlipidemia, unspecified: Secondary | ICD-10-CM

## 2022-10-17 DIAGNOSIS — R252 Cramp and spasm: Secondary | ICD-10-CM | POA: Diagnosis not present

## 2022-10-17 DIAGNOSIS — N1831 Chronic kidney disease, stage 3a: Secondary | ICD-10-CM

## 2022-10-17 DIAGNOSIS — J301 Allergic rhinitis due to pollen: Secondary | ICD-10-CM

## 2022-10-17 DIAGNOSIS — I119 Hypertensive heart disease without heart failure: Secondary | ICD-10-CM | POA: Diagnosis not present

## 2022-10-17 DIAGNOSIS — I10 Essential (primary) hypertension: Secondary | ICD-10-CM

## 2022-10-17 DIAGNOSIS — K219 Gastro-esophageal reflux disease without esophagitis: Secondary | ICD-10-CM | POA: Diagnosis not present

## 2022-10-17 LAB — CBC WITH DIFFERENTIAL/PLATELET
Basophils Absolute: 0 10*3/uL (ref 0.0–0.1)
Basophils Relative: 0.5 % (ref 0.0–3.0)
Eosinophils Absolute: 0.2 10*3/uL (ref 0.0–0.7)
Eosinophils Relative: 3.7 % (ref 0.0–5.0)
HCT: 38.3 % (ref 36.0–46.0)
Hemoglobin: 12.1 g/dL (ref 12.0–15.0)
Lymphocytes Relative: 24.7 % (ref 12.0–46.0)
Lymphs Abs: 1.4 10*3/uL (ref 0.7–4.0)
MCHC: 31.6 g/dL (ref 30.0–36.0)
MCV: 95.8 fl (ref 78.0–100.0)
Monocytes Absolute: 0.5 10*3/uL (ref 0.1–1.0)
Monocytes Relative: 8.9 % (ref 3.0–12.0)
Neutro Abs: 3.5 10*3/uL (ref 1.4–7.7)
Neutrophils Relative %: 62.2 % (ref 43.0–77.0)
Platelets: 360 10*3/uL (ref 150.0–400.0)
RBC: 3.99 Mil/uL (ref 3.87–5.11)
RDW: 14.8 % (ref 11.5–15.5)
WBC: 5.7 10*3/uL (ref 4.0–10.5)

## 2022-10-17 LAB — URINALYSIS, ROUTINE W REFLEX MICROSCOPIC
Bilirubin Urine: NEGATIVE
Hgb urine dipstick: NEGATIVE
Leukocytes,Ua: NEGATIVE
Nitrite: NEGATIVE
Specific Gravity, Urine: >=1.03 — AB (ref 1.000–1.030)
Total Protein, Urine: NEGATIVE
Urine Glucose: NEGATIVE
Urobilinogen, UA: 0.2 (ref 0.0–1.0)
pH: 5.5 (ref 5.0–8.0)

## 2022-10-17 LAB — BASIC METABOLIC PANEL
BUN: 16 mg/dL (ref 6–23)
CO2: 22 mEq/L (ref 19–32)
Calcium: 10 mg/dL (ref 8.4–10.5)
Chloride: 107 mEq/L (ref 96–112)
Creatinine, Ser: 1.06 mg/dL (ref 0.40–1.20)
GFR: 53.7 mL/min — ABNORMAL LOW (ref >60.00)
Glucose, Bld: 93 mg/dL (ref 70–99)
Potassium: 4 mEq/L (ref 3.5–5.1)
Sodium: 141 mEq/L (ref 135–145)

## 2022-10-17 LAB — CK: Total CK: 116 U/L (ref 7–177)

## 2022-10-17 MED ORDER — FAMOTIDINE 40 MG PO TABS
40.0000 mg | ORAL_TABLET | Freq: Every day | ORAL | 1 refills | Status: DC
Start: 2022-10-17 — End: 2023-04-07

## 2022-10-17 MED ORDER — EZETIMIBE 10 MG PO TABS
10.0000 mg | ORAL_TABLET | Freq: Every day | ORAL | 0 refills | Status: DC
Start: 2022-10-17 — End: 2023-02-17

## 2022-10-17 MED ORDER — HYDRALAZINE HCL 25 MG PO TABS: ORAL_TABLET | ORAL | 1 refills | Status: DC

## 2022-10-17 MED ORDER — PRAVASTATIN SODIUM 40 MG PO TABS: 40.0000 mg | ORAL_TABLET | Freq: Every day | ORAL | 1 refills | Status: DC

## 2022-10-17 MED ORDER — MONTELUKAST SODIUM 10 MG PO TABS
10.0000 mg | ORAL_TABLET | Freq: Every day | ORAL | 1 refills | Status: DC
Start: 2022-10-17 — End: 2023-09-05

## 2022-10-17 MED ORDER — TRIAMCINOLONE ACETONIDE 0.5 % EX CREA
1.0000 | TOPICAL_CREAM | Freq: Three times a day (TID) | CUTANEOUS | 3 refills | Status: DC
Start: 2022-10-17 — End: 2023-02-15

## 2022-10-17 MED ORDER — AMLODIPINE BESYLATE 10 MG PO TABS
ORAL_TABLET | ORAL | 1 refills | Status: DC
Start: 2022-10-17 — End: 2023-04-22

## 2022-10-17 MED ORDER — PANTOPRAZOLE SODIUM 20 MG PO TBEC: 20.0000 mg | DELAYED_RELEASE_TABLET | Freq: Every day | ORAL | 1 refills | Status: DC

## 2022-10-17 MED ORDER — CARVEDILOL 6.25 MG PO TABS
6.2500 mg | ORAL_TABLET | Freq: Two times a day (BID) | ORAL | 1 refills | Status: DC
Start: 2022-10-17 — End: 2023-06-19

## 2022-10-17 NOTE — Patient Instructions (Signed)
Hypertension, Adult High blood pressure (hypertension) is when the force of blood pumping through the arteries is too strong. The arteries are the blood vessels that carry blood from the heart throughout the body. Hypertension forces the heart to work harder to pump blood and may cause arteries to become narrow or stiff. Untreated or uncontrolled hypertension can lead to a heart attack, heart failure, a stroke, kidney disease, and other problems. A blood pressure reading consists of a higher number over a lower number. Ideally, your blood pressure should be below 120/80. The first ("top") number is called the systolic pressure. It is a measure of the pressure in your arteries as your heart beats. The second ("bottom") number is called the diastolic pressure. It is a measure of the pressure in your arteries as the heart relaxes. What are the causes? The exact cause of this condition is not known. There are some conditions that result in high blood pressure. What increases the risk? Certain factors may make you more likely to develop high blood pressure. Some of these risk factors are under your control, including: Smoking. Not getting enough exercise or physical activity. Being overweight. Having too much fat, sugar, calories, or salt (sodium) in your diet. Drinking too much alcohol. Other risk factors include: Having a personal history of heart disease, diabetes, high cholesterol, or kidney disease. Stress. Having a family history of high blood pressure and high cholesterol. Having obstructive sleep apnea. Age. The risk increases with age. What are the signs or symptoms? High blood pressure may not cause symptoms. Very high blood pressure (hypertensive crisis) may cause: Headache. Fast or irregular heartbeats (palpitations). Shortness of breath. Nosebleed. Nausea and vomiting. Vision changes. Severe chest pain, dizziness, and seizures. How is this diagnosed? This condition is diagnosed by  measuring your blood pressure while you are seated, with your arm resting on a flat surface, your legs uncrossed, and your feet flat on the floor. The cuff of the blood pressure monitor will be placed directly against the skin of your upper arm at the level of your heart. Blood pressure should be measured at least twice using the same arm. Certain conditions can cause a difference in blood pressure between your right and left arms. If you have a high blood pressure reading during one visit or you have normal blood pressure with other risk factors, you may be asked to: Return on a different day to have your blood pressure checked again. Monitor your blood pressure at home for 1 week or longer. If you are diagnosed with hypertension, you may have other blood or imaging tests to help your health care provider understand your overall risk for other conditions. How is this treated? This condition is treated by making healthy lifestyle changes, such as eating healthy foods, exercising more, and reducing your alcohol intake. You may be referred for counseling on a healthy diet and physical activity. Your health care provider may prescribe medicine if lifestyle changes are not enough to get your blood pressure under control and if: Your systolic blood pressure is above 130. Your diastolic blood pressure is above 80. Your personal target blood pressure may vary depending on your medical conditions, your age, and other factors. Follow these instructions at home: Eating and drinking  Eat a diet that is high in fiber and potassium, and low in sodium, added sugar, and fat. An example of this eating plan is called the DASH diet. DASH stands for Dietary Approaches to Stop Hypertension. To eat this way: Eat   plenty of fresh fruits and vegetables. Try to fill one half of your plate at each meal with fruits and vegetables. Eat whole grains, such as whole-wheat pasta, brown rice, or whole-grain bread. Fill about one  fourth of your plate with whole grains. Eat or drink low-fat dairy products, such as skim milk or low-fat yogurt. Avoid fatty cuts of meat, processed or cured meats, and poultry with skin. Fill about one fourth of your plate with lean proteins, such as fish, chicken without skin, beans, eggs, or tofu. Avoid pre-made and processed foods. These tend to be higher in sodium, added sugar, and fat. Reduce your daily sodium intake. Many people with hypertension should eat less than 1,500 mg of sodium a day. Do not drink alcohol if: Your health care provider tells you not to drink. You are pregnant, may be pregnant, or are planning to become pregnant. If you drink alcohol: Limit how much you have to: 0-1 drink a day for women. 0-2 drinks a day for men. Know how much alcohol is in your drink. In the [place] one drink equals one 12 oz bottle of beer (355 mL), one 5 oz glass of wine (148 mL), or one 1 oz glass of hard liquor (44 mL). Lifestyle  Work with your health care provider to maintain a healthy body weight or to lose weight. Ask what an ideal weight is for you. Get at least 30 minutes of exercise that causes your heart to beat faster (aerobic exercise) most days of the week. Activities may include walking, swimming, or biking. Include exercise to strengthen your muscles (resistance exercise), such as Pilates or lifting weights, as part of your weekly exercise routine. Try to do these types of exercises for 30 minutes at least 3 days a week. Do not use any products that contain nicotine or tobacco. These products include cigarettes, chewing tobacco, and vaping devices, such as e-cigarettes. If you need help quitting, ask your health care provider. Monitor your blood pressure at home as told by your health care provider. Keep all follow-up visits. This is important. Medicines Take over-the-counter and prescription medicines only as told by your health care provider. Follow directions carefully. Blood  pressure medicines must be taken as prescribed. Do not skip doses of blood pressure medicine. Doing this puts you at risk for problems and can make the medicine less effective. Ask your health care provider about side effects or reactions to medicines that you should watch for. Contact a health care provider if you: Think you are having a reaction to a medicine you are taking. Have headaches that keep coming back (recurring). Feel dizzy. Have swelling in your ankles. Have trouble with your vision. Get help right away if you: Develop a severe headache or confusion. Have unusual weakness or numbness. Feel faint. Have severe pain in your chest or abdomen. Vomit repeatedly. Have trouble breathing. These symptoms may be an emergency. Get help right away. Call 911. Do not wait to see if the symptoms will go away. Do not drive yourself to the hospital. Summary Hypertension is when the force of blood pumping through your arteries is too strong. If this condition is not controlled, it may put you at risk for serious complications. Your personal target blood pressure may vary depending on your medical conditions, your age, and other factors. For most people, a normal blood pressure is less than 120/80. Hypertension is treated with lifestyle changes, medicines, or a combination of both. Lifestyle changes include losing weight, eating a healthy,   low-sodium diet, exercising more, and limiting alcohol. This information is not intended to replace advice given to you by your health care provider. Make sure you discuss any questions you have with your health care provider. Document Revised: 03/07/2021 Document Reviewed: 03/07/2021 Elsevier Patient Education  2024 Elsevier Inc.  

## 2022-10-17 NOTE — Progress Notes (Signed)
Subjective:  Patient ID: Kayla Lowe, female    DOB: 02-Apr-1956  Age: 67 y.o. MRN: [id number]  CC: Allergic Rhinitis , Rash, Hypertension, and Gastroesophageal Reflux   HPI Kayla Lowe presents for a CPX and F/up ---  She is active and denies chest pain, shortness of breath, diaphoresis, or edema.  She complains of nocturnal heartburn that is not adequately well-controlled with the current dose of the PPI.  She denies odynophagia or dysphagia.  Outpatient Medications Prior to Visit  Medication Sig Dispense Refill   aspirin EC 81 MG tablet Take 81 mg by mouth daily. Swallow whole.     cetirizine (ZYRTEC) 10 MG tablet Take 10 mg by mouth daily.     Cholecalciferol (VITAMIN D) 50 MCG (2000 UT) CAPS Take 1 capsule by mouth daily.     cyclobenzaprine (FLEXERIL) 10 MG tablet TAKE 1 TABLET(10 MG) BY MOUTH THREE TIMES DAILY AS NEEDED FOR MUSCLE SPASMS 30 tablet 2   fluticasone (FLONASE) 50 MCG/ACT nasal spray PLACE 2 SPRAYS INTO BOTH NOSTRILS DAILY. 16 g 2   amLODipine (NORVASC) 10 MG tablet TAKE 1 TABLET(10 MG) BY MOUTH DAILY 90 tablet 1   amoxicillin-clavulanate (AUGMENTIN) 875-125 MG tablet Take 1 tablet by mouth 2 (two) times daily. 14 tablet 0   carvedilol (COREG) 6.25 MG tablet TAKE 1 TABLET(6.25 MG) BY MOUTH TWICE DAILY WITH A MEAL 180 tablet 0   ezetimibe (ZETIA) 10 MG tablet TAKE 1 TABLET BY MOUTH DAILY 90 tablet 0   hydrALAZINE (APRESOLINE) 25 MG tablet TAKE 1 TABLET(25 MG) BY MOUTH THREE TIMES DAILY 270 tablet 1   montelukast (SINGULAIR) 10 MG tablet TAKE 1 TABLET BY MOUTH EVERY NIGHT AT BEDTIME 90 tablet 0   nabumetone (RELAFEN) 500 MG tablet TAKE 1 TABLET(500 MG) BY MOUTH TWICE DAILY AS NEEDED 180 tablet 0   pantoprazole (PROTONIX) 20 MG tablet TAKE 1 TABLET(20 MG) BY MOUTH DAILY 90 tablet 1   pravastatin (PRAVACHOL) 40 MG tablet TAKE 1 TABLET(40 MG) BY MOUTH DAILY 90 tablet 0   promethazine-dextromethorphan (PROMETHAZINE-DM) 6.25-15 MG/5ML syrup Take  5 mLs by mouth 4 (four) times daily as needed for cough. 118 mL 0   No facility-administered medications prior to visit.    ROS Review of Systems  Constitutional: Negative.  Negative for diaphoresis, fatigue and unexpected weight change.  HENT:  Positive for postnasal drip and rhinorrhea. Negative for nosebleeds, sinus pressure, sore throat, trouble swallowing and voice change.   Eyes: Negative.   Respiratory: Negative.  Negative for cough, chest tightness, shortness of breath and wheezing.   Cardiovascular:  Negative for chest pain, palpitations and leg swelling.  Gastrointestinal:  Negative for abdominal pain, constipation, diarrhea, nausea and vomiting.       Nocturnal heartburn  Genitourinary: Negative.  Negative for difficulty urinating.  Musculoskeletal: Negative.  Negative for arthralgias and myalgias.  Skin:  Positive for rash. Negative for color change.  Neurological:  Negative for dizziness, weakness and headaches.  Hematological:  Negative for adenopathy. Does not bruise/bleed easily.  Psychiatric/Behavioral: Negative.      Objective:  BP 124/76 (BP Location: Right Arm, Patient Position: Sitting, Cuff Size: Large)   Pulse 84   Temp 98.1 F (36.7 C) (Oral)   Ht 5\' 4"  (1.626 m)   Wt 202 lb (91.6 kg)   SpO2 93%   BMI 34.67 kg/m   BP Readings from Last 3 Encounters:  10/17/22 124/76  04/18/22 (!) 148/84  01/25/22 128/85    Wt Readings from Last 3 Encounters:  10/17/22 202 lb (91.6 kg)  04/18/22 211 lb (95.7 kg)  03/22/22 214 lb (97.1 kg)    Physical Exam Vitals reviewed.  Constitutional:      Appearance: She is not ill-appearing.  HENT:     Nose: Nose normal.     Mouth/Throat:     Mouth: Mucous membranes are moist.  Eyes:     General: No scleral icterus.    Conjunctiva/sclera: Conjunctivae normal.  Cardiovascular:     Rate and Rhythm: Normal rate and regular rhythm.     Heart sounds: No murmur heard.    Comments: EKG- NSR, 86 bpm LAD Minimal  LVH No Q waves or ST/T wave changes Pulmonary:     Effort: Pulmonary effort is normal.     Breath sounds: No stridor. No wheezing, rhonchi or rales.  Abdominal:     General: Abdomen is flat.     Palpations: There is no mass.     Tenderness: There is no abdominal tenderness. There is no guarding.     Hernia: No hernia is present.  Musculoskeletal:        General: Normal range of motion.     Cervical back: Neck supple.     Right lower leg: No edema.     Left lower leg: No edema.  Lymphadenopathy:     Cervical: No cervical adenopathy.  Skin:    General: Skin is warm and dry.     Coloration: Skin is not pale.  Neurological:     General: No focal deficit present.     Mental Status: She is alert. Mental status is at baseline.  Psychiatric:        Mood and Affect: Mood normal.        Behavior: Behavior normal.     Lab Results  Component Value Date   WBC 5.7 10/17/2022   HGB 12.1 10/17/2022   HCT 38.3 10/17/2022   PLT 360.0 10/17/2022   GLUCOSE 93 10/17/2022   CHOL 187 04/18/2022   TRIG 127.0 04/18/2022   HDL 54.00 04/18/2022   LDLDIRECT 220.0 09/16/2019   LDLCALC 108 (H) 04/18/2022   ALT 14 04/18/2022   AST 18 04/18/2022   NA 141 10/17/2022   K 4.0 10/17/2022   CL 107 10/17/2022   CREATININE 1.06 10/17/2022   BUN 16 10/17/2022   CO2 22 10/17/2022   TSH 1.20 04/18/2022   INR 0.97 07/10/2013   HGBA1C 5.4 08/31/2019    DG Bone Density  Result Date: 09/12/2022 EXAM: DUAL X-RAY ABSORPTIOMETRY (DXA) FOR BONE MINERAL DENSITY IMPRESSION: Referring Physician:  Etta Grandchild Your patient completed a bone mineral density test using GE Lunar iDXA system (analysis version: 16). Technologist:    lmn PATIENT: Name: Kayla Lowe, Kayla Lowe Patient ID: [id number] Birth Date: 11-09-1955 Height: 63.2 in. Sex: Female Measured: 09/12/2022 Weight: 203.6 lbs. Indications: Chronic Kidney Disease, Estrogen Deficient, History of Fracture (Adult) (V15.51), Hysterectomy, Pantoprazole,  Postmenopausal, Rheumatoid Arthritis (714.0), Vitamin D Deficient Fractures: Right Ankle Treatments: Vitamin D (E933.5) ASSESSMENT: The BMD measured at Femur Neck Right is 0.833 g/cm2 with a T-score of -1.5. This patient is considered osteopenic/low bone mass according to World Health Organization [facility]) criteria. The quality of the exam is good. The lumbar spine was excluded due to degenerative changes as noted on previous exam. Site Region Measured Date Measured Age YA BMD Significant CHANGE T-score DualFemur Neck Right 09/12/2022 69.1 -1.5 0.833 g/cm2 DualFemur Neck Right 01/01/2019 65.4 -1.3 0.854 g/cm2 DualFemur Total Mean 09/12/2022 69.1 -0.8 0.906 g/cm2 DualFemur Total  Mean 01/01/2019 65.4 -0.7 0.914 g/cm2 Right Forearm Radius 33% 09/12/2022 69.1 -1.0 0.785 g/cm2 Right Forearm Radius 33% 01/01/2019 65.4 -0.7 0.828 g/cm2 World Health Organization [facility]) criteria for post-menopausal, Caucasian Women: Normal       T-score at or above -1 SD Osteopenia   T-score between -1 and -2.5 SD Osteoporosis T-score at or below -2.5 SD RECOMMENDATION: 1. All patients should optimize calcium and vitamin D intake. 2. Consider FDA-approved medical therapies in postmenopausal women and men aged 75 years and older, based on the following: a. A hip or vertebral (clinical or morphometric) fracture. b. T-score = -2.5 at the femoral neck or spine after appropriate evaluation to exclude secondary causes. c. Low bone mass (T-score between -1.0 and -2.5 at the femoral neck or spine) and a 10-year probability of a hip fracture = 3% or a 10-year probability of a major osteoporosis-related fracture = 20% based on the [place]-adapted WHO algorithm. d. Clinician judgment and/or patient preferences may indicate treatment for people with 10-year fracture probabilities above or below these levels. FOLLOW-UP: Patients with diagnosis of osteoporosis or at high risk for fracture should have regular bone mineral density tests.? Patients eligible for  Medicare are allowed routine testing every 2 years.? The testing frequency can be increased to one year for patients who have rapidly progressing disease, are receiving or discontinuing medical therapy to restore bone mass, or have additional risk factors. I have reviewed this study and agree with the findings. [facility] FRAX* 10-year Probability of Fracture Based on femoral neck BMD: DualFemur (Right) Major Osteoporotic Fracture: 8.5% Hip Fracture:                1.1% Population:                  [place] (Black) Risk Factors: History of Fracture (Adult) (V15.51), Rheumatoid Arthritis (714.0) *FRAX is a Armed forces logistics/support/administrative officer of the [employer] of [employer] for Metabolic Bone Disease, a World Science writer (WHO) [employer]. ASSESSMENT: The probability of a major osteoporotic fracture is 8.5% within the next ten years. The probability of a hip fracture is 1.1% within the next ten years. Electronically Signed   By: Romona Curls M.D.   On: 09/12/2022 08:33    Assessment & Plan:   Essential hypertension, benign- LVH has not worsened.  Her blood pressure is adequately well-controlled. -     Basic metabolic panel; Future -     CBC with Differential/Platelet; Future -     hydrALAZINE HCl; TAKE 1 TABLET(25 MG) BY MOUTH THREE TIMES DAILY  Dispense: 270 tablet; Refill: 1 -     Urinalysis, Routine w reflex microscopic; Future -     EKG 12-Lead  LVH (left ventricular hypertrophy) due to hypertensive disease, without heart failure -     Basic metabolic panel; Future -     hydrALAZINE HCl; TAKE 1 TABLET(25 MG) BY MOUTH THREE TIMES DAILY  Dispense: 270 tablet; Refill: 1  Stage 3a chronic kidney disease (HCC)- Will avoid nephrotoxic agents. -     Basic metabolic panel; Future -     CBC with Differential/Platelet; Future -     Urinalysis, Routine w reflex microscopic; Future  Essential hypertension -     amLODIPine Besylate; TAKE 1 TABLET(10 MG) BY MOUTH DAILY  Dispense:  90 tablet; Refill: 1 -     Carvedilol; Take 1 tablet (6.25 mg total) by mouth 2 (two) times daily with a meal.  Dispense: 180 tablet; Refill: 1  Dyslipidemia,  goal LDL below 130- LDL goal achieved. Doing well on the statin  -     Ezetimibe; Take 1 tablet (10 mg total) by mouth daily.  Dispense: 90 tablet; Refill: 0 -     Pravastatin Sodium; Take 1 tablet (40 mg total) by mouth daily.  Dispense: 90 tablet; Refill: 1 -     CK; Future  Nocturnal muscle cramps -     CK; Future  Seasonal allergic rhinitis due to pollen -     Montelukast Sodium; Take 1 tablet (10 mg total) by mouth at bedtime.  Dispense: 90 tablet; Refill: 1  Gastroesophageal reflux disease without esophagitis- Will add an H2 blocker to the PPI. -     Pantoprazole Sodium; Take 1 tablet (20 mg total) by mouth daily.  Dispense: 90 tablet; Refill: 1 -     Famotidine; Take 1 tablet (40 mg total) by mouth at bedtime.  Dispense: 90 tablet; Refill: 1  Intrinsic eczema -     Triamcinolone Acetonide; Apply 1 Application topically 3 (three) times daily.  Dispense: 30 g; Refill: 3     Follow-up: Return in about 6 months (around 04/18/2023).  Sanda Linger, MD

## 2022-10-20 ENCOUNTER — Encounter: Payer: Self-pay | Admitting: Internal Medicine

## 2022-11-11 ENCOUNTER — Other Ambulatory Visit: Payer: Self-pay | Admitting: Internal Medicine

## 2022-11-11 DIAGNOSIS — K219 Gastro-esophageal reflux disease without esophagitis: Secondary | ICD-10-CM

## 2022-11-11 DIAGNOSIS — M170 Bilateral primary osteoarthritis of knee: Secondary | ICD-10-CM

## 2023-01-03 ENCOUNTER — Other Ambulatory Visit: Payer: Self-pay | Admitting: Internal Medicine

## 2023-01-03 DIAGNOSIS — Z1231 Encounter for screening mammogram for malignant neoplasm of breast: Secondary | ICD-10-CM

## 2023-01-13 DIAGNOSIS — C8580 Other specified types of non-Hodgkin lymphoma, unspecified site: Secondary | ICD-10-CM

## 2023-01-13 HISTORY — DX: Other specified types of non-hodgkin lymphoma, unspecified site: C85.80

## 2023-01-23 ENCOUNTER — Ambulatory Visit
Admission: RE | Admit: 2023-01-23 | Discharge: 2023-01-23 | Disposition: A | Discharge status: Home / Self Care | Payer: Medicare Other | Source: Ambulatory Visit | Attending: Internal Medicine | Admitting: Internal Medicine

## 2023-01-23 DIAGNOSIS — Z1231 Encounter for screening mammogram for malignant neoplasm of breast: Secondary | ICD-10-CM | POA: Diagnosis not present

## 2023-02-03 ENCOUNTER — Inpatient Hospital Stay (HOSPITAL_COMMUNITY): Payer: Medicare Other

## 2023-02-03 ENCOUNTER — Inpatient Hospital Stay (HOSPITAL_COMMUNITY)
Admission: EM | Admit: 2023-02-03 | Discharge: 2023-02-15 | DRG: 820 | Disposition: A | Discharge status: Skilled Nursing Facility | Payer: Medicare Other | Attending: Internal Medicine | Admitting: Internal Medicine

## 2023-02-03 ENCOUNTER — Other Ambulatory Visit: Payer: Self-pay

## 2023-02-03 ENCOUNTER — Emergency Department (HOSPITAL_COMMUNITY): Payer: Medicare Other

## 2023-02-03 ENCOUNTER — Encounter (HOSPITAL_COMMUNITY): Payer: Self-pay

## 2023-02-03 DIAGNOSIS — E876 Hypokalemia: Secondary | ICD-10-CM | POA: Diagnosis present

## 2023-02-03 DIAGNOSIS — R918 Other nonspecific abnormal finding of lung field: Secondary | ICD-10-CM | POA: Diagnosis not present

## 2023-02-03 DIAGNOSIS — J189 Pneumonia, unspecified organism: Secondary | ICD-10-CM | POA: Diagnosis not present

## 2023-02-03 DIAGNOSIS — K219 Gastro-esophageal reflux disease without esophagitis: Secondary | ICD-10-CM | POA: Diagnosis present

## 2023-02-03 DIAGNOSIS — N182 Chronic kidney disease, stage 2 (mild): Secondary | ICD-10-CM | POA: Diagnosis not present

## 2023-02-03 DIAGNOSIS — I129 Hypertensive chronic kidney disease with stage 1 through stage 4 chronic kidney disease, or unspecified chronic kidney disease: Secondary | ICD-10-CM | POA: Diagnosis not present

## 2023-02-03 DIAGNOSIS — R600 Localized edema: Secondary | ICD-10-CM | POA: Diagnosis not present

## 2023-02-03 DIAGNOSIS — Y95 Nosocomial condition: Secondary | ICD-10-CM | POA: Diagnosis not present

## 2023-02-03 DIAGNOSIS — C8513 Unspecified B-cell lymphoma, intra-abdominal lymph nodes: Secondary | ICD-10-CM | POA: Diagnosis not present

## 2023-02-03 DIAGNOSIS — M810 Age-related osteoporosis without current pathological fracture: Secondary | ICD-10-CM | POA: Diagnosis not present

## 2023-02-03 DIAGNOSIS — K5669 Other partial intestinal obstruction: Secondary | ICD-10-CM | POA: Diagnosis not present

## 2023-02-03 DIAGNOSIS — Z8249 Family history of ischemic heart disease and other diseases of the circulatory system: Secondary | ICD-10-CM

## 2023-02-03 DIAGNOSIS — N179 Acute kidney failure, unspecified: Secondary | ICD-10-CM | POA: Diagnosis not present

## 2023-02-03 DIAGNOSIS — Z713 Dietary counseling and surveillance: Secondary | ICD-10-CM

## 2023-02-03 DIAGNOSIS — E860 Dehydration: Secondary | ICD-10-CM | POA: Diagnosis not present

## 2023-02-03 DIAGNOSIS — N1831 Chronic kidney disease, stage 3a: Secondary | ICD-10-CM | POA: Diagnosis present

## 2023-02-03 DIAGNOSIS — J8283 Eosinophilic asthma: Secondary | ICD-10-CM | POA: Diagnosis not present

## 2023-02-03 DIAGNOSIS — R197 Diarrhea, unspecified: Secondary | ICD-10-CM | POA: Diagnosis not present

## 2023-02-03 DIAGNOSIS — E785 Hyperlipidemia, unspecified: Secondary | ICD-10-CM | POA: Diagnosis not present

## 2023-02-03 DIAGNOSIS — F411 Generalized anxiety disorder: Secondary | ICD-10-CM | POA: Diagnosis present

## 2023-02-03 DIAGNOSIS — I517 Cardiomegaly: Secondary | ICD-10-CM | POA: Diagnosis not present

## 2023-02-03 DIAGNOSIS — Z6834 Body mass index (BMI) 34.0-34.9, adult: Secondary | ICD-10-CM

## 2023-02-03 DIAGNOSIS — Z4682 Encounter for fitting and adjustment of non-vascular catheter: Secondary | ICD-10-CM | POA: Diagnosis not present

## 2023-02-03 DIAGNOSIS — R5381 Other malaise: Secondary | ICD-10-CM | POA: Diagnosis not present

## 2023-02-03 DIAGNOSIS — T8141XA Infection following a procedure, superficial incisional surgical site, initial encounter: Secondary | ICD-10-CM | POA: Diagnosis not present

## 2023-02-03 DIAGNOSIS — Z7982 Long term (current) use of aspirin: Secondary | ICD-10-CM | POA: Diagnosis not present

## 2023-02-03 DIAGNOSIS — J4530 Mild persistent asthma, uncomplicated: Secondary | ICD-10-CM | POA: Diagnosis not present

## 2023-02-03 DIAGNOSIS — J45909 Unspecified asthma, uncomplicated: Secondary | ICD-10-CM | POA: Diagnosis not present

## 2023-02-03 DIAGNOSIS — K56609 Unspecified intestinal obstruction, unspecified as to partial versus complete obstruction: Principal | ICD-10-CM | POA: Diagnosis present

## 2023-02-03 DIAGNOSIS — R2689 Other abnormalities of gait and mobility: Secondary | ICD-10-CM | POA: Diagnosis not present

## 2023-02-03 DIAGNOSIS — E559 Vitamin D deficiency, unspecified: Secondary | ICD-10-CM | POA: Diagnosis not present

## 2023-02-03 DIAGNOSIS — C8519 Unspecified B-cell lymphoma, extranodal and solid organ sites: Secondary | ICD-10-CM | POA: Diagnosis not present

## 2023-02-03 DIAGNOSIS — Z888 Allergy status to other drugs, medicaments and biological substances status: Secondary | ICD-10-CM | POA: Diagnosis not present

## 2023-02-03 DIAGNOSIS — L7682 Other postprocedural complications of skin and subcutaneous tissue: Secondary | ICD-10-CM | POA: Diagnosis not present

## 2023-02-03 DIAGNOSIS — R109 Unspecified abdominal pain: Secondary | ICD-10-CM | POA: Diagnosis not present

## 2023-02-03 DIAGNOSIS — Z79899 Other long term (current) drug therapy: Secondary | ICD-10-CM

## 2023-02-03 DIAGNOSIS — M85851 Other specified disorders of bone density and structure, right thigh: Secondary | ICD-10-CM | POA: Diagnosis not present

## 2023-02-03 DIAGNOSIS — Z8673 Personal history of transient ischemic attack (TIA), and cerebral infarction without residual deficits: Secondary | ICD-10-CM | POA: Diagnosis not present

## 2023-02-03 DIAGNOSIS — M6281 Muscle weakness (generalized): Secondary | ICD-10-CM | POA: Diagnosis not present

## 2023-02-03 DIAGNOSIS — I10 Essential (primary) hypertension: Secondary | ICD-10-CM | POA: Diagnosis present

## 2023-02-03 DIAGNOSIS — R1310 Dysphagia, unspecified: Secondary | ICD-10-CM | POA: Diagnosis not present

## 2023-02-03 DIAGNOSIS — Z90710 Acquired absence of both cervix and uterus: Secondary | ICD-10-CM

## 2023-02-03 DIAGNOSIS — M1990 Unspecified osteoarthritis, unspecified site: Secondary | ICD-10-CM | POA: Diagnosis not present

## 2023-02-03 DIAGNOSIS — I119 Hypertensive heart disease without heart failure: Secondary | ICD-10-CM | POA: Diagnosis not present

## 2023-02-03 DIAGNOSIS — K5730 Diverticulosis of large intestine without perforation or abscess without bleeding: Secondary | ICD-10-CM | POA: Diagnosis not present

## 2023-02-03 DIAGNOSIS — C8510 Unspecified B-cell lymphoma, unspecified site: Secondary | ICD-10-CM | POA: Diagnosis not present

## 2023-02-03 DIAGNOSIS — J90 Pleural effusion, not elsewhere classified: Secondary | ICD-10-CM | POA: Diagnosis not present

## 2023-02-03 DIAGNOSIS — R1011 Right upper quadrant pain: Secondary | ICD-10-CM | POA: Diagnosis not present

## 2023-02-03 DIAGNOSIS — Z48815 Encounter for surgical aftercare following surgery on the digestive system: Secondary | ICD-10-CM | POA: Diagnosis not present

## 2023-02-03 DIAGNOSIS — R2681 Unsteadiness on feet: Secondary | ICD-10-CM | POA: Diagnosis not present

## 2023-02-03 DIAGNOSIS — I700 Atherosclerosis of aorta: Secondary | ICD-10-CM | POA: Diagnosis not present

## 2023-02-03 DIAGNOSIS — R140 Abdominal distension (gaseous): Secondary | ICD-10-CM | POA: Diagnosis not present

## 2023-02-03 DIAGNOSIS — R1110 Vomiting, unspecified: Secondary | ICD-10-CM | POA: Diagnosis not present

## 2023-02-03 DIAGNOSIS — L2084 Intrinsic (allergic) eczema: Secondary | ICD-10-CM | POA: Diagnosis not present

## 2023-02-03 LAB — CBC WITH DIFFERENTIAL/PLATELET
Abs Immature Granulocytes: 0.01 10*3/uL (ref 0.00–0.07)
Basophils Absolute: 0 10*3/uL (ref 0.0–0.1)
Basophils Relative: 0 %
Eosinophils Absolute: 0 10*3/uL (ref 0.0–0.5)
Eosinophils Relative: 0 %
HCT: 43.8 % (ref 36.0–46.0)
Hemoglobin: 13.9 g/dL (ref 12.0–15.0)
Immature Granulocytes: 0 %
Lymphocytes Relative: 18 %
Lymphs Abs: 1 10*3/uL (ref 0.7–4.0)
MCH: 30.6 pg (ref 26.0–34.0)
MCHC: 31.7 g/dL (ref 30.0–36.0)
MCV: 96.5 fL (ref 80.0–100.0)
Monocytes Absolute: 1 10*3/uL (ref 0.1–1.0)
Monocytes Relative: 19 %
Neutro Abs: 3.3 10*3/uL (ref 1.7–7.7)
Neutrophils Relative %: 63 %
Platelets: 350 10*3/uL (ref 150–400)
RBC: 4.54 MIL/uL (ref 3.87–5.11)
RDW: 14 % (ref 11.5–15.5)
WBC: 5.4 10*3/uL (ref 4.0–10.5)
nRBC: 0 % (ref 0.0–0.2)

## 2023-02-03 LAB — COMPREHENSIVE METABOLIC PANEL
ALT: 14 U/L (ref 0–44)
AST: 18 U/L (ref 15–41)
Albumin: 3.7 g/dL (ref 3.5–5.0)
Alkaline Phosphatase: 90 U/L (ref 38–126)
Anion gap: 9 (ref 5–15)
BUN: 23 mg/dL (ref 8–23)
CO2: 25 mmol/L (ref 22–32)
Calcium: 10 mg/dL (ref 8.9–10.3)
Chloride: 105 mmol/L (ref 98–111)
Creatinine, Ser: 1.17 mg/dL — ABNORMAL HIGH (ref 0.44–1.00)
GFR, Estimated: 51 mL/min — ABNORMAL LOW (ref >60)
Glucose, Bld: 99 mg/dL (ref 70–99)
Potassium: 4 mmol/L (ref 3.5–5.1)
Sodium: 139 mmol/L (ref 135–145)
Total Bilirubin: 0.9 mg/dL (ref 0.3–1.2)
Total Protein: 7.7 g/dL (ref 6.5–8.1)

## 2023-02-03 LAB — LIPASE, BLOOD: Lipase: 29 U/L (ref 11–51)

## 2023-02-03 MED ORDER — SODIUM CHLORIDE 0.9 % IV BOLUS
1000.0000 mL | Freq: Once | INTRAVENOUS | Status: AC
  Administered 2023-02-03: 1000 mL via INTRAVENOUS

## 2023-02-03 MED ORDER — ENOXAPARIN SODIUM 40 MG/0.4ML IJ SOSY
40.0000 mg | PREFILLED_SYRINGE | INTRAMUSCULAR | Status: DC
  Administered 2023-02-03: 40 mg via SUBCUTANEOUS
  Filled 2023-02-03: qty 0.4

## 2023-02-03 MED ORDER — MORPHINE SULFATE (PF) 4 MG/ML IV SOLN
4.0000 mg | Freq: Once | INTRAVENOUS | Status: AC
  Administered 2023-02-03: 4 mg via INTRAVENOUS
  Filled 2023-02-03: qty 1

## 2023-02-03 MED ORDER — ONDANSETRON HCL 4 MG PO TABS: 4.0000 mg | ORAL_TABLET | Freq: Four times a day (QID) | ORAL | Status: DC | PRN

## 2023-02-03 MED ORDER — MORPHINE SULFATE (PF) 2 MG/ML IV SOLN
2.0000 mg | INTRAVENOUS | Status: DC | PRN
  Administered 2023-02-04 (×4): 2 mg via INTRAVENOUS
  Filled 2023-02-03 (×4): qty 1

## 2023-02-03 MED ORDER — ONDANSETRON HCL 4 MG/2ML IJ SOLN
4.0000 mg | Freq: Four times a day (QID) | INTRAMUSCULAR | Status: DC | PRN
  Administered 2023-02-05 – 2023-02-13 (×5): 4 mg via INTRAVENOUS
  Filled 2023-02-03 (×6): qty 2

## 2023-02-03 MED ORDER — DIATRIZOATE MEGLUMINE & SODIUM 66-10 % PO SOLN
90.0000 mL | Freq: Once | ORAL | Status: AC
  Administered 2023-02-03: 90 mL via NASOGASTRIC
  Filled 2023-02-03: qty 90

## 2023-02-03 MED ORDER — DIATRIZOATE MEGLUMINE & SODIUM 66-10 % PO SOLN: 90.0000 mL | Freq: Once | ORAL | Status: DC

## 2023-02-03 MED ORDER — IOHEXOL 300 MG/ML  SOLN
100.0000 mL | Freq: Once | INTRAMUSCULAR | Status: AC | PRN
  Administered 2023-02-03: 100 mL via INTRAVENOUS

## 2023-02-03 MED ORDER — ONDANSETRON HCL 4 MG/2ML IJ SOLN
4.0000 mg | Freq: Once | INTRAMUSCULAR | Status: AC
  Administered 2023-02-03: 4 mg via INTRAVENOUS
  Filled 2023-02-03: qty 2

## 2023-02-03 MED ORDER — DEXTROSE IN LACTATED RINGERS 5 % IV SOLN: INTRAVENOUS | Status: DC

## 2023-02-03 NOTE — ED Notes (Addendum)
Patient transported to CT 

## 2023-02-03 NOTE — ED Provider Notes (Signed)
[place] EMERGENCY DEPARTMENT AT [facility] Provider Note   CSN: [id number] Arrival date & time: 02/03/23  1328     History  Chief Complaint  Patient presents with   Abdominal Pain    Kayla Lowe is a 67 y.o. female history of hypertension, GERD, chronic kidney disease, and who presents ED today for abdominal pain.  She reports persistent pain across her upper abdomen for the past 3 days with associated nausea, vomiting, and diarrhea.  Denies any aggravating event prior to onset of symptoms.  No fevers at home, back pain, or dysuria.  Reports experiencing pain like this a couple times over the past several months, all of which have resolved on its own. History of stroke to be in the past.  No additional complaints or concerns at the time.    Home Medications Prior to Admission medications   Medication Sig Start Date End Date Taking? Authorizing Provider  amLODipine (NORVASC) 10 MG tablet TAKE 1 TABLET(10 MG) BY MOUTH DAILY 10/17/22   Etta Grandchild, MD  aspirin EC 81 MG tablet Take 81 mg by mouth daily. Swallow whole.    [provider]  carvedilol (COREG) 6.25 MG tablet Take 1 tablet (6.25 mg total) by mouth 2 (two) times daily with a meal. 10/17/22   Etta Grandchild, MD  cetirizine (ZYRTEC) 10 MG tablet Take 10 mg by mouth daily.    [provider]  Cholecalciferol (VITAMIN D) 50 MCG (2000 UT) CAPS Take 1 capsule by mouth daily.    [provider]  cyclobenzaprine (FLEXERIL) 10 MG tablet TAKE 1 TABLET(10 MG) BY MOUTH THREE TIMES DAILY AS NEEDED FOR MUSCLE SPASMS 09/08/22   Etta Grandchild, MD  ezetimibe (ZETIA) 10 MG tablet Take 1 tablet (10 mg total) by mouth daily. 10/17/22   Etta Grandchild, MD  famotidine (PEPCID) 40 MG tablet Take 1 tablet (40 mg total) by mouth at bedtime. 10/17/22   Etta Grandchild, MD  fluticasone (FLONASE) 50 MCG/ACT nasal spray PLACE 2 SPRAYS INTO BOTH NOSTRILS DAILY. 11/21/18   Marcine Matar, MD   hydrALAZINE (APRESOLINE) 25 MG tablet TAKE 1 TABLET(25 MG) BY MOUTH THREE TIMES DAILY 10/17/22   Etta Grandchild, MD  montelukast (SINGULAIR) 10 MG tablet Take 1 tablet (10 mg total) by mouth at bedtime. 10/17/22   Etta Grandchild, MD  nabumetone (RELAFEN) 500 MG tablet TAKE 1 TABLET(500 MG) BY MOUTH TWICE DAILY AS NEEDED 11/11/22   Etta Grandchild, MD  pantoprazole (PROTONIX) 20 MG tablet TAKE 1 TABLET(20 MG) BY MOUTH DAILY 11/11/22   Etta Grandchild, MD  pravastatin (PRAVACHOL) 40 MG tablet Take 1 tablet (40 mg total) by mouth daily. 10/17/22   Etta Grandchild, MD  triamcinolone cream (KENALOG) 0.5 % Apply 1 Application topically 3 (three) times daily. 10/17/22   Etta Grandchild, MD      Allergies    Cozaar [losartan potassium], Lisinopril, and Statins    Review of Systems   Review of Systems  Gastrointestinal:  Positive for abdominal pain.  All other systems reviewed and are negative.   Physical Exam Updated Vital Signs BP (!) 145/75 (BP Location: Right Arm)   Pulse 88   Temp 99 F (37.2 C)   Resp 19   Ht 5\' 4"  (1.626 m)   Wt 91.5 kg   SpO2 97%   BMI 34.63 kg/m  Physical Exam Vitals and nursing note reviewed.  Constitutional:      Appearance: Normal  appearance.  HENT:     Head: Normocephalic and atraumatic.     Mouth/Throat:     Mouth: Mucous membranes are moist.  Eyes:     Conjunctiva/sclera: Conjunctivae normal.     Pupils: Pupils are equal, round, and reactive to light.  Cardiovascular:     Rate and Rhythm: Normal rate and regular rhythm.     Pulses: Normal pulses.  Pulmonary:     Effort: Pulmonary effort is normal.     Breath sounds: Normal breath sounds.  Abdominal:     General: There is no distension.     Palpations: Abdomen is soft. There is no mass.     Tenderness: There is abdominal tenderness. There is no right CVA tenderness, left CVA tenderness, guarding or rebound.     Comments: Tenderness to palpation of the upper abdomen  Musculoskeletal:        General:  Normal range of motion.  Skin:    General: Skin is warm and dry.     Findings: No rash.  Neurological:     General: No focal deficit present.     Mental Status: She is alert.     Sensory: No sensory deficit.     Motor: No weakness.  Psychiatric:        Mood and Affect: Mood normal.        Behavior: Behavior normal.    ED Results / Procedures / Treatments   Labs (all labs ordered are listed, but only abnormal results are displayed) Labs Reviewed  COMPREHENSIVE METABOLIC PANEL - Abnormal; Notable for the following components:      Result Value   Creatinine, Ser 1.17 (*)    GFR, Estimated 51 (*)    All other components within normal limits  LIPASE, BLOOD  CBC WITH DIFFERENTIAL/PLATELET  URINALYSIS, ROUTINE W REFLEX MICROSCOPIC    EKG None  Radiology CT ABDOMEN PELVIS W CONTRAST  Result Date: 02/03/2023 CLINICAL DATA:  Bilateral upper quadrant pain. Nausea, vomiting, and diarrhea for 2 days. EXAM: CT ABDOMEN AND PELVIS WITH CONTRAST TECHNIQUE: Multidetector CT imaging of the abdomen and pelvis was performed using the standard protocol following bolus administration of intravenous contrast. RADIATION DOSE REDUCTION: This exam was performed according to the departmental dose-optimization program which includes automated exposure control, adjustment of the mA and/or kV according to patient size and/or use of iterative reconstruction technique. CONTRAST:  OMNIPAQUE IOHEXOL 300 MG/ML  SOLN COMPARISON:  07/10/2013 FINDINGS: Lower Chest: No acute findings. Hepatobiliary: No suspicious hepatic masses identified. Gallbladder is unremarkable. No evidence of biliary ductal dilatation. Pancreas:  No mass or inflammatory changes. Spleen: Within normal limits in size and appearance. Adrenals/Urinary Tract: No suspicious masses identified. No evidence of ureteral calculi or hydronephrosis. Stomach/Bowel: Multiple moderately dilated small bowel loops are seen. A transition point is seen in the  central small bowel mesentery which shows tethering, suspicious for adhesion. A markedly dilated small bowel loop is seen in the left abdomen which shows wall thickening and is suspicious for a closed loop obstruction. No evidence of pneumatosis, free intraperitoneal air, or abscess. Normal appendix visualized. Diverticulosis is seen mainly involving the descending and sigmoid colon, however there is no evidence of diverticulitis. Vascular/Lymphatic: No pathologically enlarged lymph nodes. No acute vascular findings. Reproductive: Prior hysterectomy noted. Adnexal regions are unremarkable in appearance. Tiny amount of free fluid noted in pelvic cul-de-sac. Other:  None. Musculoskeletal:  No suspicious bone lesions identified. IMPRESSION: Small bowel obstruction, with transition point in central small bowel mesentery suspicious for  adhesion. Markedly dilated small bowel loop in left abdomen mild wall thickening, raising suspicion for closed loop obstruction. Surgical consultation is recommended. No evidence of pneumatosis, free air, or abscess. Colonic diverticulosis, without radiographic evidence of diverticulitis. Electronically Signed   By: Danae Orleans M.D.   On: 02/03/2023 17:36    Procedures Procedures: not indicated.   Medications Ordered in ED Medications  morphine (PF) 4 MG/ML injection 4 mg (has no administration in time range)  sodium chloride 0.9 % bolus 1,000 mL (0 mLs Intravenous Stopped 02/03/23 1759)  ondansetron (ZOFRAN) injection 4 mg (4 mg Intravenous Given 02/03/23 1556)  morphine (PF) 4 MG/ML injection 4 mg (4 mg Intravenous Given 02/03/23 1556)  iohexol (OMNIPAQUE) 300 MG/ML solution 100 mL (100 mLs Intravenous Contrast Given 02/03/23 1623)    ED Course/ Medical Decision Making/ A&P                                 Medical Decision Making Amount and/or Complexity of Data Reviewed Labs: ordered. Radiology: ordered.  Risk Prescription drug management.   This patient presents  to the ED for concern of abdominal pain, this involves an extensive number of treatment options, and is a complaint that carries with it a high risk of complications and morbidity.   Differential diagnosis includes: GERD, pancreatitis, cholecystitis, cholangitis, choledocholithiasis, gastritis, gastroenteritis, gastric ulcer, bowel obstruction, IBS, IBD, diverticulitis, etc.   Comorbidities  See HPI above   Additional History  Additional history obtained from patient's previous records.   Lab Tests  I ordered and personally interpreted labs.  The pertinent results include:   Elevated creatinine of 1.17, otherwise CMP is unremarkable. CBC is unremarkable no acute anemia or infection. Lipase unremarkable.   Imaging Studies  I ordered imaging studies including CT abdomen/pelvis with contrast  I independently visualized and interpreted imaging which showed:  - Small bowel obstruction, with transition point in central small bowel mesentery suspicious for adhesion.  - Markedly dilated small bowel loop in left abdomen mild wall thickening, raising suspicion for closed loop obstruction.  - No evidence of pneumatosis, free air, or abscess.  - Colonic diverticulosis, without radiographic evidence of diverticulitis. I agree with the radiologist interpretation   Consultations  I requested consultation with general surgery,  and discussed lab and imaging findings as well as pertinent plan - they recommend: admit to medicine and they will consult. I consulted the hospitalist and they are agreeable to admit patient for further evaluation workup.   Problem List / ED Course / Critical Interventions / Medication Management  Abdominal pain and vomiting I ordered medications including: Morphine, Zofran, and NS for pain and nausea  Reevaluation of the patient after these medicines showed that the patient improved I have reviewed the patients home medicines and have made adjustments as  needed.   Social Determinants of Health  Physical activity   Test / Admission - Considered  Discussed findings with patient and family at bedside. They are agreeable to plan.       Final Clinical Impression(s) / ED Diagnoses Final diagnoses:  Small bowel obstruction [facility])    Rx / DC Orders ED Discharge Orders     None         Maxwell Marion, PA-C 02/03/23 1820    Pricilla Loveless, MD 02/03/23 2233

## 2023-02-03 NOTE — Consult Note (Signed)
Reason for Consult: Small bowel obstruction Referring Physician: Mikeal Hawthorne MD  Kayla Lowe is an 67 y.o. female.  HPI: Pleasant 67 year old female with a 3-day history of upper abdominal pain, nausea, vomiting and diarrhea.  She states she has had about a 3-week history of diffuse abdominal pain off and on.  The pain is worsened in the last 3 days.  She was brought in for evaluations.  CT scan was done which showed signs of a small bowel obstruction.  There is some question if she had a closed-loop obstruction.  Her white count is normal.  Her pain seems well-controlled and NG tube is in place.  No history of previous surgery.  Past Medical History:  Diagnosis Date   GERD (gastroesophageal reflux disease)    Hyperlipidemia    Hypertension    Osteoarthritis     Past Surgical History:  Procedure Laterality Date   ABDOMINAL HYSTERECTOMY  1987    Family History  Problem Relation Age of Onset   Cancer Father    Hypertension Brother    Diabetes Brother    Diabetes Paternal Grandfather    Diabetes Paternal Uncle    Colon cancer Neg Hx    Hypercalcemia Neg Hx     Social History:  reports that she has never smoked. She has never used smokeless tobacco. She reports that she does not drink alcohol and does not use drugs.  Allergies:  Allergies  Allergen Reactions   Cozaar [Losartan Potassium]     Caused cough and sweats. Tingling in legs.   Lisinopril Cough   Statins Other (See Comments)    myalgias    Medications: I have reviewed the patient's current medications.  Results for orders placed or performed during the hospital encounter of 02/03/23 (from the past 48 hour(s))  Comprehensive metabolic panel     Status: Abnormal   Collection Time: 02/03/23  3:16 PM  Result Value Ref Range   Sodium 139 135 - 145 mmol/L   Potassium 4.0 3.5 - 5.1 mmol/L   Chloride 105 98 - 111 mmol/L   CO2 25 22 - 32 mmol/L   Glucose, Bld 99 70 - 99 mg/dL    Comment: Glucose reference  range applies only to samples taken after fasting for at least 8 hours.   BUN 23 8 - 23 mg/dL   Creatinine, Ser 1.61 (H) 0.44 - 1.00 mg/dL   Calcium 09.6 8.9 - 04.5 mg/dL   Total Protein 7.7 6.5 - 8.1 g/dL   Albumin 3.7 3.5 - 5.0 g/dL   AST 18 15 - 41 U/L   ALT 14 0 - 44 U/L   Alkaline Phosphatase 90 38 - 126 U/L   Total Bilirubin 0.9 0.3 - 1.2 mg/dL   GFR, Estimated 51 (L) >60 mL/min    Comment: (NOTE) Calculated using the CKD-EPI Creatinine Equation (2021)    Anion gap 9 5 - 15    Comment: Performed at [facility], [address] [place], [place] [address]  Lipase, blood     Status: None   Collection Time: 02/03/23  3:16 PM  Result Value Ref Range   Lipase 29 11 - 51 U/L    Comment: Performed at [facility], [address], [place], [place] [address]  CBC with Differential     Status: None   Collection Time: 02/03/23  3:16 PM  Result Value Ref Range   WBC 5.4 4.0 - 10.5 K/uL   RBC 4.54 3.87 - 5.11 MIL/uL  Hemoglobin 13.9 12.0 - 15.0 g/dL   HCT 29.5 28.4 - 13.2 %   MCV 96.5 80.0 - 100.0 fL   MCH 30.6 26.0 - 34.0 pg   MCHC 31.7 30.0 - 36.0 g/dL   RDW 44.0 10.2 - 72.5 %   Platelets 350 150 - 400 K/uL   nRBC 0.0 0.0 - 0.2 %   Neutrophils Relative % 63 %   Neutro Abs 3.3 1.7 - 7.7 K/uL   Lymphocytes Relative 18 %   Lymphs Abs 1.0 0.7 - 4.0 K/uL   Monocytes Relative 19 %   Monocytes Absolute 1.0 0.1 - 1.0 K/uL   Eosinophils Relative 0 %   Eosinophils Absolute 0.0 0.0 - 0.5 K/uL   Basophils Relative 0 %   Basophils Absolute 0.0 0.0 - 0.1 K/uL   Immature Granulocytes 0 %   Abs Immature Granulocytes 0.01 0.00 - 0.07 K/uL    Comment: Performed at [facility], [address], [place], [place] [address]    CT ABDOMEN PELVIS W CONTRAST  Result Date: 02/03/2023 CLINICAL DATA:  Bilateral upper quadrant pain. Nausea, vomiting, and diarrhea for 2 days. EXAM: CT ABDOMEN AND PELVIS WITH CONTRAST TECHNIQUE: Multidetector  CT imaging of the abdomen and pelvis was performed using the standard protocol following bolus administration of intravenous contrast. RADIATION DOSE REDUCTION: This exam was performed according to the departmental dose-optimization program which includes automated exposure control, adjustment of the mA and/or kV according to patient size and/or use of iterative reconstruction technique. CONTRAST:  OMNIPAQUE IOHEXOL 300 MG/ML  SOLN COMPARISON:  07/10/2013 FINDINGS: Lower Chest: No acute findings. Hepatobiliary: No suspicious hepatic masses identified. Gallbladder is unremarkable. No evidence of biliary ductal dilatation. Pancreas:  No mass or inflammatory changes. Spleen: Within normal limits in size and appearance. Adrenals/Urinary Tract: No suspicious masses identified. No evidence of ureteral calculi or hydronephrosis. Stomach/Bowel: Multiple moderately dilated small bowel loops are seen. A transition point is seen in the central small bowel mesentery which shows tethering, suspicious for adhesion. A markedly dilated small bowel loop is seen in the left abdomen which shows wall thickening and is suspicious for a closed loop obstruction. No evidence of pneumatosis, free intraperitoneal air, or abscess. Normal appendix visualized. Diverticulosis is seen mainly involving the descending and sigmoid colon, however there is no evidence of diverticulitis. Vascular/Lymphatic: No pathologically enlarged lymph nodes. No acute vascular findings. Reproductive: Prior hysterectomy noted. Adnexal regions are unremarkable in appearance. Tiny amount of free fluid noted in pelvic cul-de-sac. Other:  None. Musculoskeletal:  No suspicious bone lesions identified. IMPRESSION: Small bowel obstruction, with transition point in central small bowel mesentery suspicious for adhesion. Markedly dilated small bowel loop in left abdomen mild wall thickening, raising suspicion for closed loop obstruction. Surgical consultation is  recommended. No evidence of pneumatosis, free air, or abscess. Colonic diverticulosis, without radiographic evidence of diverticulitis. Electronically Signed   By: Danae Orleans M.D.   On: 02/03/2023 17:36    Review of Systems  Constitutional: Negative.   Gastrointestinal:  Positive for abdominal distention, abdominal pain, diarrhea, nausea and vomiting.   Blood pressure (!) 145/75, pulse 88, temperature 99 F (37.2 C), resp. rate 19, height 5\' 4"  (1.626 m), weight 91.5 kg, SpO2 97%. Physical Exam HENT:     Head: Normocephalic.  Cardiovascular:     Rate and Rhythm: Normal rate.  Pulmonary:     Effort: Pulmonary effort is normal.  Abdominal:     General: There is distension.     Palpations:  Abdomen is soft.     Tenderness: There is abdominal tenderness. There is no guarding or rebound.     Hernia: No hernia is present.  Skin:    General: Skin is warm.  Neurological:     General: No focal deficit present.     Mental Status: She is alert.     Assessment/Plan: Small bowel obstruction  Examination is reassuring.  She has no rebound or guarding.  She does have distention but no rebound or guarding.  Her tenderness is actually quite mild.  Given her clinical exam findings, closed-loop obstruction is much less likely.  Recommend small bowel protocol with NG tube placement and n.p.o. status and IV fluids for tonight.  Will reassess in a.m.  Primary team will follow up.  Dortha Schwalbe MD 02/03/2023, 6:57 PM    High complexity

## 2023-02-03 NOTE — ED Notes (Signed)
ED TO INPATIENT HANDOFF REPORT  Name/Age/Gender Kayla Lowe 67 y.o. female  Code Status    Code Status Orders  (From admission, onward)           Start     Ordered   02/03/23 1832  Full code  Continuous       Question:  By:  Answer:  Consent: discussion documented in EHR   02/03/23 1832           Code Status History     This patient has a current code status but no historical code status.       Home/SNF/Other Home  Chief Complaint SBO (small bowel obstruction) (HCC) [K56.609]  Level of Care/Admitting Diagnosis ED Disposition     ED Disposition  Admit   Condition  --   Comment  Hospital Area: [facility] [[id number]]  Level of Care: Med-Surg [16]  May admit patient to [facility] or [facility] if equivalent level of care is available:: Yes  Covid Evaluation: Asymptomatic - no recent exposure (last 10 days) testing not required  Diagnosis: SBO (small bowel obstruction) [facility]) [[id number]]  Admitting Physician: Rometta Emery [[id number]]  Attending Physician: Rometta Emery [[id number]]  Certification:: I certify this patient will need inpatient services for at least 2 midnights  Expected Medical Readiness: 02/06/2023          Medical History Past Medical History:  Diagnosis Date   GERD (gastroesophageal reflux disease)    Hyperlipidemia    Hypertension    Osteoarthritis     Allergies Allergies  Allergen Reactions   Cozaar [Losartan Potassium]     Caused cough and sweats. Tingling in legs.   Lisinopril Cough   Statins Other (See Comments)    myalgias    IV Location/Drains/Wounds Patient Lines/Drains/Airways Status     Active Line/Drains/Airways     Name Placement date Placement time Site Days   Peripheral IV 02/03/23 20 G Right Antecubital 02/03/23  1554  Antecubital  less than 1            Labs/Imaging Results for orders placed or performed during the hospital encounter of 02/03/23 (from the past 48  hour(s))  Comprehensive metabolic panel     Status: Abnormal   Collection Time: 02/03/23  3:16 PM  Result Value Ref Range   Sodium 139 135 - 145 mmol/L   Potassium 4.0 3.5 - 5.1 mmol/L   Chloride 105 98 - 111 mmol/L   CO2 25 22 - 32 mmol/L   Glucose, Bld 99 70 - 99 mg/dL    Comment: Glucose reference range applies only to samples taken after fasting for at least 8 hours.   BUN 23 8 - 23 mg/dL   Creatinine, Ser 0.45 (H) 0.44 - 1.00 mg/dL   Calcium 40.9 8.9 - 81.1 mg/dL   Total Protein 7.7 6.5 - 8.1 g/dL   Albumin 3.7 3.5 - 5.0 g/dL   AST 18 15 - 41 U/L   ALT 14 0 - 44 U/L   Alkaline Phosphatase 90 38 - 126 U/L   Total Bilirubin 0.9 0.3 - 1.2 mg/dL   GFR, Estimated 51 (L) >60 mL/min    Comment: (NOTE) Calculated using the CKD-EPI Creatinine Equation (2021)    Anion gap 9 5 - 15    Comment: Performed at [facility], [address] [place], [place] [address]  Lipase, blood     Status: None   Collection Time: 02/03/23  3:16 PM  Result  Value Ref Range   Lipase 29 11 - 51 U/L    Comment: Performed at [facility], [address] [place], [place] [address]  CBC with Differential     Status: None   Collection Time: 02/03/23  3:16 PM  Result Value Ref Range   WBC 5.4 4.0 - 10.5 K/uL   RBC 4.54 3.87 - 5.11 MIL/uL   Hemoglobin 13.9 12.0 - 15.0 g/dL   HCT 60.4 54.0 - 98.1 %   MCV 96.5 80.0 - 100.0 fL   MCH 30.6 26.0 - 34.0 pg   MCHC 31.7 30.0 - 36.0 g/dL   RDW 19.1 47.8 - 29.5 %   Platelets 350 150 - 400 K/uL   nRBC 0.0 0.0 - 0.2 %   Neutrophils Relative % 63 %   Neutro Abs 3.3 1.7 - 7.7 K/uL   Lymphocytes Relative 18 %   Lymphs Abs 1.0 0.7 - 4.0 K/uL   Monocytes Relative 19 %   Monocytes Absolute 1.0 0.1 - 1.0 K/uL   Eosinophils Relative 0 %   Eosinophils Absolute 0.0 0.0 - 0.5 K/uL   Basophils Relative 0 %   Basophils Absolute 0.0 0.0 - 0.1 K/uL   Immature Granulocytes 0 %   Abs Immature Granulocytes 0.01 0.00 - 0.07 K/uL     Comment: Performed at [facility], [address], [place], [place] [address]   CT ABDOMEN PELVIS W CONTRAST  Result Date: 02/03/2023 CLINICAL DATA:  Bilateral upper quadrant pain. Nausea, vomiting, and diarrhea for 2 days. EXAM: CT ABDOMEN AND PELVIS WITH CONTRAST TECHNIQUE: Multidetector CT imaging of the abdomen and pelvis was performed using the standard protocol following bolus administration of intravenous contrast. RADIATION DOSE REDUCTION: This exam was performed according to the departmental dose-optimization program which includes automated exposure control, adjustment of the mA and/or kV according to patient size and/or use of iterative reconstruction technique. CONTRAST:  OMNIPAQUE IOHEXOL 300 MG/ML  SOLN COMPARISON:  07/10/2013 FINDINGS: Lower Chest: No acute findings. Hepatobiliary: No suspicious hepatic masses identified. Gallbladder is unremarkable. No evidence of biliary ductal dilatation. Pancreas:  No mass or inflammatory changes. Spleen: Within normal limits in size and appearance. Adrenals/Urinary Tract: No suspicious masses identified. No evidence of ureteral calculi or hydronephrosis. Stomach/Bowel: Multiple moderately dilated small bowel loops are seen. A transition point is seen in the central small bowel mesentery which shows tethering, suspicious for adhesion. A markedly dilated small bowel loop is seen in the left abdomen which shows wall thickening and is suspicious for a closed loop obstruction. No evidence of pneumatosis, free intraperitoneal air, or abscess. Normal appendix visualized. Diverticulosis is seen mainly involving the descending and sigmoid colon, however there is no evidence of diverticulitis. Vascular/Lymphatic: No pathologically enlarged lymph nodes. No acute vascular findings. Reproductive: Prior hysterectomy noted. Adnexal regions are unremarkable in appearance. Tiny amount of free fluid noted in pelvic cul-de-sac. Other:  None.  Musculoskeletal:  No suspicious bone lesions identified. IMPRESSION: Small bowel obstruction, with transition point in central small bowel mesentery suspicious for adhesion. Markedly dilated small bowel loop in left abdomen mild wall thickening, raising suspicion for closed loop obstruction. Surgical consultation is recommended. No evidence of pneumatosis, free air, or abscess. Colonic diverticulosis, without radiographic evidence of diverticulitis. Electronically Signed   By: Danae Orleans M.D.   On: 02/03/2023 17:36    Pending Labs Unresulted Labs (From admission, onward)     Start     Ordered   02/03/23 1516  Urinalysis, Routine  w reflex microscopic -Urine, Clean Catch  Once,   URGENT       Question:  Specimen Source  Answer:  Urine, Clean Catch   02/03/23 1522   Signed and Held  HIV Antibody (routine testing w rflx)  (HIV Antibody (Routine testing w reflex) panel)  Once,   R        Signed and Held   Signed and Held  CBC  (enoxaparin (LOVENOX)    CrCl >/= 30 ml/min)  Once,   R       Comments: Baseline for enoxaparin therapy IF NOT ALREADY DRAWN.  Notify MD if PLT < 100 K.    Signed and Held   Signed and Held  Creatinine, serum  (enoxaparin (LOVENOX)    CrCl >/= 30 ml/min)  Once,   R       Comments: Baseline for enoxaparin therapy IF NOT ALREADY DRAWN.    Signed and Held   Signed and Held  Creatinine, serum  (enoxaparin (LOVENOX)    CrCl >/= 30 ml/min)  Weekly,   R     Comments: while on enoxaparin therapy    Signed and Held   Signed and Held  CBC  Tomorrow morning,   R        Signed and Held   Signed and Held  Comprehensive metabolic panel  Tomorrow morning,   R        Signed and Held            Vitals/Pain Today's Vitals   02/03/23 1444 02/03/23 1444 02/03/23 1600 02/03/23 1652  BP: (!) 141/75  (!) 145/75   Pulse: 78  88   Resp:   19   Temp: 99 F (37.2 C)     TempSrc:      SpO2: 97%  97%   Weight:      Height:      PainSc:  10-Worst pain ever  0-No pain     Isolation Precautions No active isolations  Medications Medications  morphine (PF) 4 MG/ML injection 4 mg (has no administration in time range)  sodium chloride 0.9 % bolus 1,000 mL (0 mLs Intravenous Stopped 02/03/23 1759)  ondansetron (ZOFRAN) injection 4 mg (4 mg Intravenous Given 02/03/23 1556)  morphine (PF) 4 MG/ML injection 4 mg (4 mg Intravenous Given 02/03/23 1556)  iohexol (OMNIPAQUE) 300 MG/ML solution 100 mL (100 mLs Intravenous Contrast Given 02/03/23 1623)    Mobility walks

## 2023-02-03 NOTE — ED Triage Notes (Signed)
Pt c/o right and left upper quadrant pain 6/10, with nausea, vomiting and diarrhea x 2 days Pt denies dysuria Pt denies fever at home

## 2023-02-03 NOTE — H&P (Signed)
History and Physical    Patient: Kayla Lowe:[id number] DOB: 1955-08-10 DOA: 02/03/2023 DOS: the patient was seen and examined on 02/03/2023 PCP: Etta Grandchild, MD  Patient coming from: Home  Chief Complaint:  Chief Complaint  Patient presents with   Abdominal Pain   HPI: Kayla Lowe is a 67 y.o. female with medical history significant of GERD, essential hypertension, hyperlipidemia, osteoarthritis, history of total abdominal hysterectomy in 1987 who presents to the ER with nausea. Pain is in the right and left upper quadrant rated as 6 out of 10.  Also some diarrhea for last 2 days.  He denies admitted no bright red blood per rectum.  Patient evaluated in the ER.  CT abdomen pelvis showed signs of small bowel obstruction surgery consulted with recommendations for NG tube, IV fluids, pain, and surgery will follow.  Patient is otherwise stable and she appears to be no distress.  Review of Systems: As mentioned in the history of present illness. All other systems reviewed and are negative. Past Medical History:  Diagnosis Date   GERD (gastroesophageal reflux disease)    Hyperlipidemia    Hypertension    Osteoarthritis    Past Surgical History:  Procedure Laterality Date   ABDOMINAL HYSTERECTOMY  1987   Social History:  reports that she has never smoked. She has never used smokeless tobacco. She reports that she does not drink alcohol and does not use drugs.  Allergies  Allergen Reactions   Cozaar [Losartan Potassium]     Caused cough and sweats. Tingling in legs.   Lisinopril Cough   Statins Other (See Comments)    myalgias    Family History  Problem Relation Age of Onset   Cancer Father    Hypertension Brother    Diabetes Brother    Diabetes Paternal Grandfather    Diabetes Paternal Uncle    Colon cancer Neg Hx    Hypercalcemia Neg Hx     Prior to Admission medications   Medication Sig Start Date End Date Taking? Authorizing  Provider  amLODipine (NORVASC) 10 MG tablet TAKE 1 TABLET(10 MG) BY MOUTH DAILY 10/17/22   Etta Grandchild, MD  aspirin EC 81 MG tablet Take 81 mg by mouth daily. Swallow whole.    [provider]  carvedilol (COREG) 6.25 MG tablet Take 1 tablet (6.25 mg total) by mouth 2 (two) times daily with a meal. 10/17/22   Etta Grandchild, MD  cetirizine (ZYRTEC) 10 MG tablet Take 10 mg by mouth daily.    [provider]  Cholecalciferol (VITAMIN D) 50 MCG (2000 UT) CAPS Take 1 capsule by mouth daily.    [provider]  cyclobenzaprine (FLEXERIL) 10 MG tablet TAKE 1 TABLET(10 MG) BY MOUTH THREE TIMES DAILY AS NEEDED FOR MUSCLE SPASMS 09/08/22   Etta Grandchild, MD  ezetimibe (ZETIA) 10 MG tablet Take 1 tablet (10 mg total) by mouth daily. 10/17/22   Etta Grandchild, MD  famotidine (PEPCID) 40 MG tablet Take 1 tablet (40 mg total) by mouth at bedtime. 10/17/22   Etta Grandchild, MD  fluticasone (FLONASE) 50 MCG/ACT nasal spray PLACE 2 SPRAYS INTO BOTH NOSTRILS DAILY. 11/21/18   Marcine Matar, MD  hydrALAZINE (APRESOLINE) 25 MG tablet TAKE 1 TABLET(25 MG) BY MOUTH THREE TIMES DAILY 10/17/22   Etta Grandchild, MD  montelukast (SINGULAIR) 10 MG tablet Take 1 tablet (10 mg total) by mouth at bedtime. 10/17/22   Etta Grandchild, MD  nabumetone (RELAFEN) 500 MG  tablet TAKE 1 TABLET(500 MG) BY MOUTH TWICE DAILY AS NEEDED 11/11/22   Etta Grandchild, MD  pantoprazole (PROTONIX) 20 MG tablet TAKE 1 TABLET(20 MG) BY MOUTH DAILY 11/11/22   Etta Grandchild, MD  pravastatin (PRAVACHOL) 40 MG tablet Take 1 tablet (40 mg total) by mouth daily. 10/17/22   Etta Grandchild, MD  triamcinolone cream (KENALOG) 0.5 % Apply 1 Application topically 3 (three) times daily. 10/17/22   Etta Grandchild, MD    Physical Exam: Vitals:   02/03/23 1347 02/03/23 1351 02/03/23 1444 02/03/23 1600  BP: 117/73  (!) 141/75 (!) 145/75  Pulse: 95  78 88  Resp:    19  Temp: 98.6 F (37 C)  99 F (37.2 C)   TempSrc: Oral     SpO2:    97% 97%  Weight:  91.5 kg    Height:  5\' 4"  (1.626 m)     Constitutional: NAD, calm, comfortable Eyes: PERRL, lids and conjunctivae normal ENMT: Mucous membranes are dry. Posterior pharynx clear of any exudate or lesions.Normal dentition.  Neck: normal, supple, no masses, no thyromegaly Respiratory: clear to auscultation bilaterally, no wheezing, no crackles. Normal respiratory effort. No accessory muscle use.  Cardiovascular: Regular rate and rhythm, no murmurs / rubs / gallops. No extremity edema. 2+ pedal pulses. No carotid bruits.  Abdomen: no tenderness, no masses palpated. No hepatosplenomegaly. Bowel sounds positive.  Musculoskeletal: Good range of motion, no joint swelling or tenderness, Skin: no rashes, lesions, ulcers. No induration Neurologic: CN 2-12 grossly intact. Sensation intact, DTR normal. Strength 5/5 in all 4.  Psychiatric: Normal judgment and insight. Alert and oriented x 3. Normal mood  Data Reviewed:  Blood pressure 114/70 creatinine 1.17, white count and hemoglobin within normal CT abdomen pelvis showed small bowel obstruction with transition point in the central small bowel mesentery suspicious for admission with markedly dilated small bowel loops in the left abdomen showing wall thickening and cyst which is for closed-loop obstruction no pneumatosis or free air  Assessment and Plan:  #1 small bowel obstruction: Patient will be admitted.  NG tube already started to low suction.  IV fluids, pain control as well as nausea control.  Surgery to follow.  #2 GERD: PPIs IV.  #3 essential hypertension: Blood pressure controlled.  IV labetalol as needed while NPO.  #4 morbid obesity: Counseling  #5 chronic kidney disease stage IIIa: Appears to be at baseline.  Will hydrate  #6 hyperlipidemia: Resume statin when p.o. intakes returns.    Advance Care Planning:   Code Status: Full Code   Consults: Dr. Wilford Sports, general surgery  Family Communication: Sister at  bedside  Severity of Illness: The appropriate patient status for this patient is INPATIENT. Inpatient status is judged to be reasonable and necessary in order to provide the required intensity of service to ensure the patient's safety. The patient's presenting symptoms, physical exam findings, and initial radiographic and laboratory data in the context of their chronic comorbidities is felt to place them at high risk for further clinical deterioration. Furthermore, it is not anticipated that the patient will be medically stable for discharge from the hospital within 2 midnights of admission.   * I certify that at the point of admission it is my clinical judgment that the patient will require inpatient hospital care spanning beyond 2 midnights from the point of admission due to high intensity of service, high risk for further deterioration and high frequency of surveillance required.*  AuthorLonia Blood, MD 02/03/2023 6:32  PM  For on call review [email or web address].

## 2023-02-04 ENCOUNTER — Encounter (HOSPITAL_COMMUNITY)
Admission: EM | Disposition: A | Discharge status: Skilled Nursing Facility | Payer: Self-pay | Source: Home / Self Care | Attending: Internal Medicine

## 2023-02-04 ENCOUNTER — Inpatient Hospital Stay (HOSPITAL_COMMUNITY): Payer: Medicare Other

## 2023-02-04 ENCOUNTER — Other Ambulatory Visit: Payer: Self-pay

## 2023-02-04 ENCOUNTER — Ambulatory Visit: Payer: Medicare Other | Admitting: Family Medicine

## 2023-02-04 ENCOUNTER — Encounter (HOSPITAL_COMMUNITY): Payer: Self-pay | Admitting: Internal Medicine

## 2023-02-04 DIAGNOSIS — E785 Hyperlipidemia, unspecified: Secondary | ICD-10-CM

## 2023-02-04 DIAGNOSIS — K56609 Unspecified intestinal obstruction, unspecified as to partial versus complete obstruction: Secondary | ICD-10-CM | POA: Diagnosis not present

## 2023-02-04 DIAGNOSIS — I10 Essential (primary) hypertension: Secondary | ICD-10-CM | POA: Diagnosis not present

## 2023-02-04 DIAGNOSIS — K5669 Other partial intestinal obstruction: Secondary | ICD-10-CM | POA: Diagnosis not present

## 2023-02-04 DIAGNOSIS — J4530 Mild persistent asthma, uncomplicated: Secondary | ICD-10-CM

## 2023-02-04 HISTORY — PX: LAPAROTOMY: SHX154

## 2023-02-04 LAB — HIV ANTIBODY (ROUTINE TESTING W REFLEX): HIV Screen 4th Generation wRfx: NONREACTIVE

## 2023-02-04 LAB — CBC
HCT: 39.3 % (ref 36.0–46.0)
Hemoglobin: 12.3 g/dL (ref 12.0–15.0)
MCH: 30.9 pg (ref 26.0–34.0)
MCHC: 31.3 g/dL (ref 30.0–36.0)
MCV: 98.7 fL (ref 80.0–100.0)
Platelets: 325 10*3/uL (ref 150–400)
RBC: 3.98 MIL/uL (ref 3.87–5.11)
RDW: 14.1 % (ref 11.5–15.5)
WBC: 3.8 10*3/uL — ABNORMAL LOW (ref 4.0–10.5)
nRBC: 0 % (ref 0.0–0.2)

## 2023-02-04 LAB — COMPREHENSIVE METABOLIC PANEL
ALT: 12 U/L (ref 0–44)
AST: 14 U/L — ABNORMAL LOW (ref 15–41)
Albumin: 3.3 g/dL — ABNORMAL LOW (ref 3.5–5.0)
Alkaline Phosphatase: 75 U/L (ref 38–126)
Anion gap: 8 (ref 5–15)
BUN: 18 mg/dL (ref 8–23)
CO2: 24 mmol/L (ref 22–32)
Calcium: 9.8 mg/dL (ref 8.9–10.3)
Chloride: 107 mmol/L (ref 98–111)
Creatinine, Ser: 0.84 mg/dL (ref 0.44–1.00)
GFR, Estimated: >60 mL/min (ref >60)
Glucose, Bld: 135 mg/dL — ABNORMAL HIGH (ref 70–99)
Potassium: 3.9 mmol/L (ref 3.5–5.1)
Sodium: 139 mmol/L (ref 135–145)
Total Bilirubin: 1 mg/dL (ref 0.3–1.2)
Total Protein: 7.1 g/dL (ref 6.5–8.1)

## 2023-02-04 LAB — SURGICAL PCR SCREEN
MRSA, PCR: NEGATIVE
Staphylococcus aureus: NEGATIVE

## 2023-02-04 LAB — TYPE AND SCREEN
ABO/RH(D): O POS
Antibody Screen: NEGATIVE

## 2023-02-04 LAB — URINALYSIS, ROUTINE W REFLEX MICROSCOPIC
Glucose, UA: NEGATIVE mg/dL
Hgb urine dipstick: NEGATIVE
Ketones, ur: NEGATIVE mg/dL
Leukocytes,Ua: NEGATIVE
Nitrite: NEGATIVE
Protein, ur: NEGATIVE mg/dL
Specific Gravity, Urine: 1.02 (ref 1.005–1.030)
pH: 6 (ref 5.0–8.0)

## 2023-02-04 LAB — ABO/RH: ABO/RH(D): O POS

## 2023-02-04 SURGERY — LAPAROTOMY, EXPLORATORY
Anesthesia: General

## 2023-02-04 MED ORDER — 0.9 % SODIUM CHLORIDE (POUR BTL) OPTIME
TOPICAL | Status: DC | PRN
Start: 2023-02-04 — End: 2023-02-04
  Administered 2023-02-04: 2000 mL

## 2023-02-04 MED ORDER — HYDROMORPHONE HCL 1 MG/ML IJ SOLN
INTRAMUSCULAR | Status: AC
  Filled 2023-02-04: qty 1

## 2023-02-04 MED ORDER — MORPHINE SULFATE (PF) 2 MG/ML IV SOLN
2.0000 mg | INTRAVENOUS | Status: DC | PRN
  Administered 2023-02-04 – 2023-02-12 (×16): 2 mg via INTRAVENOUS
  Administered 2023-02-12: 4 mg via INTRAVENOUS
  Administered 2023-02-12 – 2023-02-15 (×6): 2 mg via INTRAVENOUS
  Filled 2023-02-04 (×10): qty 1
  Filled 2023-02-04: qty 2
  Filled 2023-02-04 (×12): qty 1

## 2023-02-04 MED ORDER — PHENYLEPHRINE 80 MCG/ML (10ML) SYRINGE FOR IV PUSH (FOR BLOOD PRESSURE SUPPORT)
PREFILLED_SYRINGE | INTRAVENOUS | Status: DC | PRN
Start: 2023-02-04 — End: 2023-02-04
  Administered 2023-02-04: 80 ug via INTRAVENOUS

## 2023-02-04 MED ORDER — FENTANYL CITRATE (PF) 100 MCG/2ML IJ SOLN
INTRAMUSCULAR | Status: AC
  Filled 2023-02-04: qty 2

## 2023-02-04 MED ORDER — MIDAZOLAM HCL 5 MG/5ML IJ SOLN
INTRAMUSCULAR | Status: DC | PRN
  Administered 2023-02-04: 2 mg via INTRAVENOUS

## 2023-02-04 MED ORDER — LACTATED RINGERS IV SOLN
INTRAVENOUS | Status: DC | PRN
Start: 2023-02-04 — End: 2023-02-04

## 2023-02-04 MED ORDER — KETAMINE HCL 50 MG/5ML IJ SOSY
PREFILLED_SYRINGE | INTRAMUSCULAR | Status: AC
  Filled 2023-02-04: qty 5

## 2023-02-04 MED ORDER — ROCURONIUM BROMIDE 10 MG/ML (PF) SYRINGE
PREFILLED_SYRINGE | INTRAVENOUS | Status: AC
  Filled 2023-02-04: qty 10

## 2023-02-04 MED ORDER — METHOCARBAMOL 500 MG IVPB - SIMPLE MED
500.0000 mg | Freq: Three times a day (TID) | INTRAVENOUS | Status: DC
  Administered 2023-02-04 – 2023-02-06 (×6): 500 mg via INTRAVENOUS
  Filled 2023-02-04 (×4): qty 500
  Filled 2023-02-04: qty 55
  Filled 2023-02-04: qty 500
  Filled 2023-02-04 (×2): qty 55
  Filled 2023-02-04: qty 500

## 2023-02-04 MED ORDER — PHENYLEPHRINE HCL-NACL 20-0.9 MG/250ML-% IV SOLN
INTRAVENOUS | Status: DC | PRN
Start: 2023-02-04 — End: 2023-02-04
  Administered 2023-02-04: 35 ug/min via INTRAVENOUS

## 2023-02-04 MED ORDER — PROPOFOL 10 MG/ML IV BOLUS
INTRAVENOUS | Status: AC
  Filled 2023-02-04: qty 20

## 2023-02-04 MED ORDER — ONDANSETRON HCL 4 MG/2ML IJ SOLN
INTRAMUSCULAR | Status: DC | PRN
  Administered 2023-02-04: 4 mg via INTRAVENOUS

## 2023-02-04 MED ORDER — KETAMINE HCL 10 MG/ML IJ SOLN
INTRAMUSCULAR | Status: DC | PRN
  Administered 2023-02-04: 20 mg via INTRAVENOUS

## 2023-02-04 MED ORDER — SUGAMMADEX SODIUM 200 MG/2ML IV SOLN
INTRAVENOUS | Status: DC | PRN
Start: 2023-02-04 — End: 2023-02-04
  Administered 2023-02-04: 300 mg via INTRAVENOUS

## 2023-02-04 MED ORDER — PHENOL 1.4 % MT LIQD
1.0000 | OROMUCOSAL | Status: DC | PRN
  Administered 2023-02-04: 1 via OROMUCOSAL
  Filled 2023-02-04: qty 177

## 2023-02-04 MED ORDER — SUCCINYLCHOLINE CHLORIDE 200 MG/10ML IV SOSY
PREFILLED_SYRINGE | INTRAVENOUS | Status: DC | PRN
Start: 2023-02-04 — End: 2023-02-04
  Administered 2023-02-04: 120 mg via INTRAVENOUS

## 2023-02-04 MED ORDER — BUPIVACAINE HCL (PF) 0.5 % IJ SOLN
INTRAMUSCULAR | Status: DC | PRN
Start: 2023-02-04 — End: 2023-02-04
  Administered 2023-02-04: 30 mL

## 2023-02-04 MED ORDER — PROPOFOL 10 MG/ML IV BOLUS
INTRAVENOUS | Status: DC | PRN
  Administered 2023-02-04: 150 mg via INTRAVENOUS

## 2023-02-04 MED ORDER — ACETAMINOPHEN 10 MG/ML IV SOLN
1000.0000 mg | Freq: Four times a day (QID) | INTRAVENOUS | Status: AC
  Administered 2023-02-04 – 2023-02-05 (×4): 1000 mg via INTRAVENOUS
  Filled 2023-02-04 (×4): qty 100

## 2023-02-04 MED ORDER — LIDOCAINE HCL (CARDIAC) PF 100 MG/5ML IV SOSY
PREFILLED_SYRINGE | INTRAVENOUS | Status: DC | PRN
  Administered 2023-02-04: 60 mg via INTRAVENOUS

## 2023-02-04 MED ORDER — OXYCODONE HCL 5 MG/5ML PO SOLN: 5.0000 mg | Freq: Once | ORAL | Status: DC | PRN

## 2023-02-04 MED ORDER — ROCURONIUM BROMIDE 100 MG/10ML IV SOLN
INTRAVENOUS | Status: DC | PRN
  Administered 2023-02-04: 50 mg via INTRAVENOUS

## 2023-02-04 MED ORDER — FENTANYL CITRATE (PF) 100 MCG/2ML IJ SOLN
INTRAMUSCULAR | Status: DC | PRN
  Administered 2023-02-04 (×2): 50 ug via INTRAVENOUS

## 2023-02-04 MED ORDER — DEXAMETHASONE SODIUM PHOSPHATE 10 MG/ML IJ SOLN
INTRAMUSCULAR | Status: DC | PRN
  Administered 2023-02-04: 8 mg via INTRAVENOUS

## 2023-02-04 MED ORDER — HYDROMORPHONE HCL 1 MG/ML IJ SOLN
0.2500 mg | INTRAMUSCULAR | Status: DC | PRN
  Administered 2023-02-04: 0.25 mg via INTRAVENOUS
  Administered 2023-02-04: 0.5 mg via INTRAVENOUS
  Administered 2023-02-04: 0.25 mg via INTRAVENOUS
  Administered 2023-02-04 (×2): 0.5 mg via INTRAVENOUS

## 2023-02-04 MED ORDER — BUPIVACAINE HCL (PF) 0.5 % IJ SOLN
INTRAMUSCULAR | Status: AC
  Filled 2023-02-04: qty 30

## 2023-02-04 MED ORDER — MIDAZOLAM HCL 2 MG/2ML IJ SOLN
INTRAMUSCULAR | Status: AC
  Filled 2023-02-04: qty 2

## 2023-02-04 MED ORDER — ALBUMIN HUMAN 5 % IV SOLN
INTRAVENOUS | Status: DC | PRN
Start: 2023-02-04 — End: 2023-02-04

## 2023-02-04 MED ORDER — ONDANSETRON HCL 4 MG/2ML IJ SOLN: 4.0000 mg | Freq: Once | INTRAMUSCULAR | Status: DC | PRN

## 2023-02-04 MED ORDER — SODIUM CHLORIDE 0.9 % IV SOLN
2.0000 g | Freq: Once | INTRAVENOUS | Status: AC
  Administered 2023-02-04: 2 g via INTRAVENOUS
  Filled 2023-02-04: qty 2

## 2023-02-04 MED ORDER — LIDOCAINE HCL (PF) 2 % IJ SOLN
INTRAMUSCULAR | Status: AC
  Filled 2023-02-04: qty 5

## 2023-02-04 MED ORDER — OXYCODONE HCL 5 MG PO TABS: 5.0000 mg | ORAL_TABLET | Freq: Once | ORAL | Status: DC | PRN

## 2023-02-04 MED ORDER — AMISULPRIDE (ANTIEMETIC) 5 MG/2ML IV SOLN: 10.0000 mg | Freq: Once | INTRAVENOUS | Status: DC | PRN

## 2023-02-04 SURGICAL SUPPLY — 33 items
APL PRP STRL LF DISP 70% ISPRP (MISCELLANEOUS) ×1
BAG COUNTER SPONGE SURGICOUNT (BAG) IMPLANT
BAG SPNG CNTER NS LX DISP (BAG)
CHLORAPREP W/TINT 26 (MISCELLANEOUS) ×2 IMPLANT
COVER MAYO STAND STRL (DRAPES) ×2 IMPLANT
COVER SURGICAL LIGHT HANDLE (MISCELLANEOUS) ×2 IMPLANT
DRAPE LAPAROSCOPIC ABDOMINAL (DRAPES) ×2 IMPLANT
DRAPE WARM FLUID 44X44 (DRAPES) ×2 IMPLANT
DRSG OPSITE POSTOP 4X8 (GAUZE/BANDAGES/DRESSINGS) IMPLANT
ELECT REM PT RETURN 15FT ADLT (MISCELLANEOUS) ×2 IMPLANT
GLOVE BIO SURGEON STRL SZ7.5 (GLOVE) ×2 IMPLANT
GLOVE BIOGEL PI IND STRL 7.0 (GLOVE) ×2 IMPLANT
GOWN STRL REUS W/ TWL XL LVL3 (GOWN DISPOSABLE) ×2 IMPLANT
GOWN STRL REUS W/TWL XL LVL3 (GOWN DISPOSABLE) ×1
HANDLE SUCTION POOLE (INSTRUMENTS) ×2 IMPLANT
KIT BASIN OR (CUSTOM PROCEDURE TRAY) ×2 IMPLANT
KIT TURNOVER KIT A (KITS) IMPLANT
LIGASURE IMPACT 36 18CM CVD LR (INSTRUMENTS) IMPLANT
NS IRRIG 1000ML POUR BTL (IV SOLUTION) ×2 IMPLANT
PACK GENERAL/GYN (CUSTOM PROCEDURE TRAY) ×2 IMPLANT
RELOAD PROXIMATE 75MM BLUE (ENDOMECHANICALS) ×2 IMPLANT
RELOAD STAPLE 75 3.8 BLU REG (ENDOMECHANICALS) IMPLANT
STAPLER GUN LINEAR PROX 60 (STAPLE) IMPLANT
STAPLER PROXIMATE 75MM BLUE (STAPLE) IMPLANT
STAPLER VISISTAT 35W (STAPLE) IMPLANT
SUCTION POOLE HANDLE (INSTRUMENTS) ×1
SUT PDS AB 1 TP1 96 (SUTURE) IMPLANT
SUT SILK 2 0 (SUTURE) ×1
SUT SILK 2 0 SH CR/8 (SUTURE) ×2 IMPLANT
SUT SILK 2-0 18XBRD TIE 12 (SUTURE) ×2 IMPLANT
SUT SILK 3 0 SH CR/8 (SUTURE) ×2 IMPLANT
TOWEL OR 17X26 10 PK STRL BLUE (TOWEL DISPOSABLE) ×2 IMPLANT
TRAY FOLEY MTR SLVR 16FR STAT (SET/KITS/TRAYS/PACK) ×2 IMPLANT

## 2023-02-04 NOTE — Plan of Care (Signed)
  Problem: Education: Goal: Knowledge of General Education information will improve Description Including pain rating scale, medication(s)/side effects and non-pharmacologic comfort measures Outcome: Progressing   Problem: Activity: Goal: Risk for activity intolerance will decrease Outcome: Progressing   Problem: Safety: Goal: Ability to remain free from injury will improve Outcome: Progressing

## 2023-02-04 NOTE — Anesthesia Procedure Notes (Addendum)
Procedure Name: Intubation Date/Time: 02/04/2023 2:19 PM  Performed by: Maurene Capes, CRNAPre-anesthesia Checklist: Patient identified, Emergency Drugs available, Suction available and Patient being monitored Patient Re-evaluated:Patient Re-evaluated prior to induction Oxygen Delivery Method: Circle System Utilized Preoxygenation: Pre-oxygenation with 100% oxygen Induction Type: IV induction Ventilation: Mask ventilation without difficulty Tube type: Oral Tube size: 7.5 mm Number of attempts: 1 Airway Equipment and Method: Stylet and Oral airway Placement Confirmation: ETT inserted through vocal cords under direct vision, positive ETCO2 and breath sounds checked- equal and bilateral Secured at: 22 cm Tube secured with: Tape Dental Injury: Teeth and Oropharynx as per pre-operative assessment

## 2023-02-04 NOTE — Op Note (Signed)
EXPLORATORY LAPAROTOMY, WITH SMALL BOWEL RESECTION  Procedure Note  Caylani Weatherwax 02/04/2023   Pre-op Diagnosis: SMALL BOWEL OBSTRUCTION     Post-op Diagnosis: SMALL BOWEL MASS  Procedure(s): EXPLORATORY LAPAROTOMY SMALL BOWEL RESECTION  Surgeon(s): Abigail Miyamoto, MD Moise Boring, MD  Anesthesia: General  Staff:  Scrub Person: Earnest Conroy  Estimated Blood Loss: Minimal               Specimens: SENT TO PATH  Indications: This is a 67 year old female presented with abdominal pain.  She underwent a CT scan suggesting a closed-loop small bowel obstruction with a large dilated loop of small bowel.  The decision was made to proceed to the operating room as her symptoms fail to improve with nasogastric suctioning  Findings: The patient was found to have a very large small bowel mass which was not obstructing the small bowel lumen.  Etiology of this is uncertain.  There was no other intra-abdominal pathology and no large palpable lymph nodes in the mesentery.    Procedure: The patient was brought to the operating identifies the correct patient.  She was placed upon the operating table and general anesthesia was induced.  Her abdomen was then prepped and draped in usual sterile fashion.  We created a midline incision above the umbilicus going to just the lateral edge of the umbilicus with a scalpel.  We then took this down through the subcutaneous tissue the fascia which was opened the entire length of the incision.  We then gained entrance to the peritoneal cavity.  We extended the incision superiorly.  We then eviscerated the small bowel.  She had dilated small bowel going to clap small bowel and we were able to complete eviscerate a large mass involving the small bowel that was creating the obstruction.  The small bowel proximal and distal to this appeared normal.  There were no large lymph nodes in the mesentery and the etiology of this mass is uncertain.  We  transected the small bowel proximal and distal to the mass with a GIA 75 stapler.  We took down the mesentery with the LigaSure.  We then reapproximated the proximal and distal small bowel in a side-to-side fashion with interrupted silk sutures.  We then performed 2 enterotomies and created a anastomosis of the GIA 75 stapler.  We then closed the opening with a TX 60 stapler.  We reinforced staple line with silk sutures and closed the mesentery with silk sutures as well.  The anastomosis appeared widely patent and well-perfused.  We then returned small bowel back into the abdominal cavity.  We irrigated the abdomen with a liter of normal saline.  Hemostasis appeared to be achieved.  We then closed the patient's midline fascia with a running #1 looped PDS suture.  The skin was then irrigated.  I anesthetized the fascia with Marcaine.  We then closed the incision with skin staples.  Honeycomb dressing was then applied.  The patient tolerated the procedure well.  All the counts were correct at the end of the procedure.  The patient was then extubated in the operating room and taken in a stable condition to the recovery room.          Abigail Miyamoto   Date: 02/04/2023  Time: 3:15 PM

## 2023-02-04 NOTE — Progress Notes (Signed)
PROGRESS NOTE Kayla Lowe  FUX:[id number] DOB: 08-06-1955 DOA: 02/03/2023 PCP: Etta Grandchild, MD  Brief Narrative/Hospital Course: 67 year old female with GAD, HTN HLD osteoarthritis history of total abdominal hysterectomy 987 pagetoid nausea right and left upper quadrant pain along with diarrhea of 2 days without bleeding per rectum. Seen in the ED underwent imaging study with CT scan showing signs of small bowel obstruction, general surgery was consulted and admitted with NG tube decompression IV fluid hydration.  Labs has been unremarkable with stable renal function CBC.    Subjective: Seen and examined this morning NG tube in place but complains of nausea abdominal pain Passed minimal flatus   Assessment and Plan: Principal Problem:   SBO (small bowel obstruction) (HCC) Active Problems:   Dyslipidemia, goal LDL below 130   Essential hypertension, benign   Gastroesophageal reflux disease without esophagitis   Stage 3a chronic kidney disease (HCC)   Small bowel obstruction History of TAH 1987: General Surgery following, remain on NG tube decompression IVF n.p.o.  Today abdomen exam is tender persistent nausea and abdominal pain present, surgery planning for OR today  GERD: Continue PPI  Essential hypertension: BP is controlled continue on IV prns for now as n.p.o.  HLD: Statin on hold as n.p.o.  CKD stage II: Based on GFR patient has CKD stage II, renal function remains stable and at baseline  obesity:Patient's Body mass index is 34.63 kg/m. : Will benefit with PCP follow-up, weight loss and healthy lifestyle  DVT prophylaxis: scd added-start chemical prophylaxis once okay with surgery post op Code Status:   Code Status: Full Code Family Communication: plan of care discussed with patient/patient's friend who is her roommate is at bedside. Patient status is: Inpatient because of SBO Level of care: Med-Surg   Dispo: The patient is from: Home             Anticipated disposition: tbd  Objective: Vitals last 24 hrs: Vitals:   02/03/23 2008 02/04/23 0019 02/04/23 0445 02/04/23 0938  BP: (!) 151/80 (!) 152/70 137/73 (!) 144/77  Pulse: 89 85 83 85  Resp: 16 17 19 16   Temp: 98.2 F (36.8 C) 98.1 F (36.7 C) 98 F (36.7 C) 98.3 F (36.8 C)  TempSrc: Oral Oral Oral Oral  SpO2: 97% 99% 100% 100%  Weight:      Height:       Weight change:   Physical Examination: General exam: alert awake, older than stated age HEENT:Oral mucosa moist, Ear/Nose WNL grossly Respiratory system: bilaterally clear BS, no use of accessory muscle Cardiovascular system: S1 & S2 +, No JVD. Gastrointestinal system: Abdomen slightly firm diffuse tenderness present  Nervous System:Alert, awake, moving extremities. Extremities: LE edema neg,distal peripheral pulses palpable.  Skin: No rashes,no icterus. MSK: Normal muscle bulk,tone, power  Medications reviewed:  Scheduled Meds: Continuous Infusions:  dextrose 5% lactated ringers 100 mL/hr at 02/04/23 0452    Diet Order             Diet NPO time specified  Diet effective now                  Intake/Output Summary (Last 24 hours) at 02/04/2023 1047 Last data filed at 02/04/2023 1000 Gross per 24 hour  Intake 2375.77 ml  Output 900 ml  Net 1475.77 ml   Net IO Since Admission: 1,475.77 mL [02/04/23 1047]  Wt Readings from Last 3 Encounters:  02/03/23 91.5 kg  10/17/22 91.6 kg  04/18/22 95.7 kg     Unresulted Labs (  From admission, onward)     Start     Ordered   02/05/23 0500  Basic metabolic panel  Daily,   R      02/04/23 0841   02/05/23 0500  CBC  Daily,   R      02/04/23 0841   02/04/23 0939  Surgical pcr screen  Once,   R        02/04/23 0938   02/04/23 0500  HIV Antibody (routine testing w rflx)  Once,   R        02/04/23 0500          Data Reviewed: I have personally reviewed following labs and imaging studies CBC: Recent Labs  Lab 02/03/23 1516 02/04/23 0416  WBC 5.4 3.8*   NEUTROABS 3.3  --   HGB 13.9 12.3  HCT 43.8 39.3  MCV 96.5 98.7  PLT 350 325   Basic Metabolic Panel: Recent Labs  Lab 02/03/23 1516 02/04/23 0416  NA 139 139  K 4.0 3.9  CL 105 107  CO2 25 24  GLUCOSE 99 135*  BUN 23 18  CREATININE 1.17* 0.84  CALCIUM 10.0 9.8   GFR: Estimated Creatinine Clearance: 69.3 mL/min (by C-G formula based on SCr of 0.84 mg/dL). Liver Function Tests: Recent Labs  Lab 02/03/23 1516 02/04/23 0416  AST 18 14*  ALT 14 12  ALKPHOS 90 75  BILITOT 0.9 1.0  PROT 7.7 7.1  ALBUMIN 3.7 3.3*   Recent Labs  Lab 02/03/23 1516  LIPASE 29  Antimicrobials: Anti-infectives (From admission, onward)    Start     Dose/Rate Route Frequency Ordered Stop   02/04/23 0930  cefOXitin (MEFOXIN) 2 g in sodium chloride 0.9 % 100 mL IVPB        2 g 200 mL/hr over 30 Minutes Intravenous  Once 02/04/23 0844 02/04/23 1011      Culture/Microbiology No results found for: "SDES", "SPECREQUEST", "CULT", "REPTSTATUS"  Radiology Studies: DG Abd Portable 1V-Small Bowel Obstruction Protocol-initial, 8 hr delay  Result Date: 02/04/2023 CLINICAL DATA:  Follow-up small bowel obstruction. 8 hour delay imaging. EXAM: PORTABLE ABDOMEN - 1 VIEW COMPARISON:  02/03/2023 . FINDINGS: there is an enteric tube with tip and side port in the stomach. Persistent dilated small bowel loops are identified which measure up to 3.8 cm. Gas and stool noted within normal caliber colon. IMPRESSION: Persistent small bowel obstruction pattern. Electronically Signed   By: Signa Kell M.D.   On: 02/04/2023 05:43   DG Abd Portable 1V-Small Bowel Protocol-Position Verification  Result Date: 02/03/2023 CLINICAL DATA:  Enteric tube placement EXAM: PORTABLE ABDOMEN - 1 VIEW COMPARISON:  Same day CT abdomen and pelvis FINDINGS: Gastric/enteric tube tip projects over the stomach. Partially imaged dilated small bowel loops in the right hemiabdomen. IMPRESSION: Gastric/enteric tube tip projects over the  stomach. Electronically Signed   By: Agustin Cree M.D.   On: 02/03/2023 19:27   CT ABDOMEN PELVIS W CONTRAST  Result Date: 02/03/2023 CLINICAL DATA:  Bilateral upper quadrant pain. Nausea, vomiting, and diarrhea for 2 days. EXAM: CT ABDOMEN AND PELVIS WITH CONTRAST TECHNIQUE: Multidetector CT imaging of the abdomen and pelvis was performed using the standard protocol following bolus administration of intravenous contrast. RADIATION DOSE REDUCTION: This exam was performed according to the departmental dose-optimization program which includes automated exposure control, adjustment of the mA and/or kV according to patient size and/or use of iterative reconstruction technique. CONTRAST:  OMNIPAQUE IOHEXOL 300 MG/ML  SOLN COMPARISON:  07/10/2013 FINDINGS:  Lower Chest: No acute findings. Hepatobiliary: No suspicious hepatic masses identified. Gallbladder is unremarkable. No evidence of biliary ductal dilatation. Pancreas:  No mass or inflammatory changes. Spleen: Within normal limits in size and appearance. Adrenals/Urinary Tract: No suspicious masses identified. No evidence of ureteral calculi or hydronephrosis. Stomach/Bowel: Multiple moderately dilated small bowel loops are seen. A transition point is seen in the central small bowel mesentery which shows tethering, suspicious for adhesion. A markedly dilated small bowel loop is seen in the left abdomen which shows wall thickening and is suspicious for a closed loop obstruction. No evidence of pneumatosis, free intraperitoneal air, or abscess. Normal appendix visualized. Diverticulosis is seen mainly involving the descending and sigmoid colon, however there is no evidence of diverticulitis. Vascular/Lymphatic: No pathologically enlarged lymph nodes. No acute vascular findings. Reproductive: Prior hysterectomy noted. Adnexal regions are unremarkable in appearance. Tiny amount of free fluid noted in pelvic cul-de-sac. Other:  None. Musculoskeletal:  No suspicious  bone lesions identified. IMPRESSION: Small bowel obstruction, with transition point in central small bowel mesentery suspicious for adhesion. Markedly dilated small bowel loop in left abdomen mild wall thickening, raising suspicion for closed loop obstruction. Surgical consultation is recommended. No evidence of pneumatosis, free air, or abscess. Colonic diverticulosis, without radiographic evidence of diverticulitis. Electronically Signed   By: Danae Orleans M.D.   On: 02/03/2023 17:36    LOS: 1 day  Lanae Boast, MD [facility]  02/04/2023, 10:47 AM

## 2023-02-04 NOTE — Anesthesia Preprocedure Evaluation (Addendum)
Anesthesia Evaluation  Patient identified by MRN, date of birth, ID band Patient awake    Reviewed: Allergy & Precautions, NPO status , Patient's Chart, lab work & pertinent test results  Airway Mallampati: IV  TM Distance: >3 FB Neck ROM: Full    Dental  (+) Edentulous Upper, Edentulous Lower   Pulmonary asthma    Pulmonary exam normal breath sounds clear to auscultation       Cardiovascular hypertension (145/80 preop, per pt normally 120s SBP), Pt. on medications Normal cardiovascular exam Rhythm:Regular Rate:Normal     Neuro/Psych negative neurological ROS  negative psych ROS   GI/Hepatic Neg liver ROS,GERD  Controlled and Medicated,,SBO   Endo/Other  Obesity BMI 35  Renal/GU negative Renal ROS  negative genitourinary   Musculoskeletal  (+) Arthritis , Osteoarthritis,    Abdominal  (+) + obese  Peds  Hematology negative hematology ROS (+) Hb 12.3, plt 325   Anesthesia Other Findings   Reproductive/Obstetrics negative OB ROS                             Anesthesia Physical Anesthesia Plan  ASA: 3  Anesthesia Plan: General   Post-op Pain Management: Ketamine IV* and Ofirmev IV (intra-op)*   Induction: Intravenous and Rapid sequence  PONV Risk Score and Plan: 4 or greater and Ondansetron, Dexamethasone, Midazolam and Treatment may vary due to age or medical condition  Airway Management Planned: Oral ETT  Additional Equipment: None  Intra-op Plan:   Post-operative Plan: Extubation in OR  Informed Consent: I have reviewed the patients History and Physical, chart, labs and discussed the procedure including the risks, benefits and alternatives for the proposed anesthesia with the patient or authorized representative who has indicated his/her understanding and acceptance.     Dental advisory given  Plan Discussed with: CRNA  Anesthesia Plan Comments:        Anesthesia  Quick Evaluation

## 2023-02-04 NOTE — Anesthesia Postprocedure Evaluation (Signed)
Anesthesia Post Note  Patient: Kayla Lowe  Procedure(s) Performed: EXPLORATORY LAPAROTOMY, WITH SMALL BOWEL RESECTION     Patient location during evaluation: PACU Anesthesia Type: General Level of consciousness: awake and alert, oriented and patient cooperative Pain management: pain level controlled Vital Signs Assessment: post-procedure vital signs reviewed and stable Respiratory status: spontaneous breathing, nonlabored ventilation and respiratory function stable Cardiovascular status: blood pressure returned to baseline and stable Postop Assessment: no apparent nausea or vomiting Anesthetic complications: no   No notable events documented.  Last Vitals:  Vitals:   02/04/23 1645 02/04/23 1700  BP: 122/65 123/64  Pulse: 85 85  Resp: 13 15  Temp:    SpO2: 98% 98%    Last Pain:  Vitals:   02/04/23 1645  TempSrc:   PainSc: 4                  Lannie Fields

## 2023-02-04 NOTE — Progress Notes (Signed)
Subjective/Chief Complaint: She is still having abdominal pain Passed minimal flatus   Objective: Vital signs in last 24 hours: Temp:  [98 F (36.7 C)-99 F (37.2 C)] 98 F (36.7 C) (09/23 0445) Pulse Rate:  [78-95] 83 (09/23 0445) Resp:  [16-19] 19 (09/23 0445) BP: (117-152)/(70-80) 137/73 (09/23 0445) SpO2:  [97 %-100 %] 100 % (09/23 0445) Weight:  [91.5 kg] 91.5 kg (09/22 1351) Last BM Date : 02/03/23  Intake/Output from previous day: 09/22 0701 - 09/23 0700 In: 1954.5 [I.V.:954.5; IV Piggyback:1000] Out: 350 [Urine:200] Intake/Output this shift: No intake/output data recorded.  Exam: Awake and alert. Abdomen with tenderness and guarding in the LUQ  Lab Results:  Recent Labs    02/03/23 1516 02/04/23 0416  WBC 5.4 3.8*  HGB 13.9 12.3  HCT 43.8 39.3  PLT 350 325   BMET Recent Labs    02/03/23 1516 02/04/23 0416  NA 139 139  K 4.0 3.9  CL 105 107  CO2 25 24  GLUCOSE 99 135*  BUN 23 18  CREATININE 1.17* 0.84  CALCIUM 10.0 9.8   PT/INR No results for input(s): "LABPROT", "INR" in the last 72 hours. ABG No results for input(s): "PHART", "HCO3" in the last 72 hours.  Invalid input(s): "PCO2", "PO2"  Studies/Results: DG Abd Portable 1V-Small Bowel Obstruction Protocol-initial, 8 hr delay  Result Date: 02/04/2023 CLINICAL DATA:  Follow-up small bowel obstruction. 8 hour delay imaging. EXAM: PORTABLE ABDOMEN - 1 VIEW COMPARISON:  02/03/2023 . FINDINGS: there is an enteric tube with tip and side port in the stomach. Persistent dilated small bowel loops are identified which measure up to 3.8 cm. Gas and stool noted within normal caliber colon. IMPRESSION: Persistent small bowel obstruction pattern. Electronically Signed   By: Signa Kell M.D.   On: 02/04/2023 05:43   DG Abd Portable 1V-Small Bowel Protocol-Position Verification  Result Date: 02/03/2023 CLINICAL DATA:  Enteric tube placement EXAM: PORTABLE ABDOMEN - 1 VIEW COMPARISON:  Same day CT  abdomen and pelvis FINDINGS: Gastric/enteric tube tip projects over the stomach. Partially imaged dilated small bowel loops in the right hemiabdomen. IMPRESSION: Gastric/enteric tube tip projects over the stomach. Electronically Signed   By: Agustin Cree M.D.   On: 02/03/2023 19:27   CT ABDOMEN PELVIS W CONTRAST  Result Date: 02/03/2023 CLINICAL DATA:  Bilateral upper quadrant pain. Nausea, vomiting, and diarrhea for 2 days. EXAM: CT ABDOMEN AND PELVIS WITH CONTRAST TECHNIQUE: Multidetector CT imaging of the abdomen and pelvis was performed using the standard protocol following bolus administration of intravenous contrast. RADIATION DOSE REDUCTION: This exam was performed according to the departmental dose-optimization program which includes automated exposure control, adjustment of the mA and/or kV according to patient size and/or use of iterative reconstruction technique. CONTRAST:  OMNIPAQUE IOHEXOL 300 MG/ML  SOLN COMPARISON:  07/10/2013 FINDINGS: Lower Chest: No acute findings. Hepatobiliary: No suspicious hepatic masses identified. Gallbladder is unremarkable. No evidence of biliary ductal dilatation. Pancreas:  No mass or inflammatory changes. Spleen: Within normal limits in size and appearance. Adrenals/Urinary Tract: No suspicious masses identified. No evidence of ureteral calculi or hydronephrosis. Stomach/Bowel: Multiple moderately dilated small bowel loops are seen. A transition point is seen in the central small bowel mesentery which shows tethering, suspicious for adhesion. A markedly dilated small bowel loop is seen in the left abdomen which shows wall thickening and is suspicious for a closed loop obstruction. No evidence of pneumatosis, free intraperitoneal air, or abscess. Normal appendix visualized. Diverticulosis is seen mainly involving the  descending and sigmoid colon, however there is no evidence of diverticulitis. Vascular/Lymphatic: No pathologically enlarged lymph nodes. No acute  vascular findings. Reproductive: Prior hysterectomy noted. Adnexal regions are unremarkable in appearance. Tiny amount of free fluid noted in pelvic cul-de-sac. Other:  None. Musculoskeletal:  No suspicious bone lesions identified. IMPRESSION: Small bowel obstruction, with transition point in central small bowel mesentery suspicious for adhesion. Markedly dilated small bowel loop in left abdomen mild wall thickening, raising suspicion for closed loop obstruction. Surgical consultation is recommended. No evidence of pneumatosis, free air, or abscess. Colonic diverticulosis, without radiographic evidence of diverticulitis. Electronically Signed   By: Danae Orleans M.D.   On: 02/03/2023 17:36    Anti-infectives: Anti-infectives (From admission, onward)    None       Assessment/Plan: SBO  She has not improved with NG placement. I reviewed the CT scan and I am worried this is a closed loop obstruction.  Given that and her tenderness today, I recommend proceeding to the OR for an exploratory laparotomy.  I discussed this with the patient and her family.  I explained the procedure.  I discussed the risk which include but are not limited to bleeding, infection, the need for a bowel resection, injury to surrounding structures, the potential for finding a malignancy, cardiopulmonary problems, etc.  We discussed the risk of bowel perforation with continued conservative management. They understand and wish to proceed with surgery which will be scheduled for today   Abigail Miyamoto MD 02/04/2023

## 2023-02-04 NOTE — Transfer of Care (Signed)
Immediate Anesthesia Transfer of Care Note  Patient: Kayla Lowe  Procedure(s) Performed: EXPLORATORY LAPAROTOMY, WITH SMALL BOWEL RESECTION  Patient Location: PACU  Anesthesia Type:General  Level of Consciousness: drowsy and patient cooperative  Airway & Oxygen Therapy: Patient Spontanous Breathing and Patient connected to face mask oxygen  Post-op Assessment: Report given to RN and Post -op Vital signs reviewed and stable  Post vital signs: Reviewed and stable  Last Vitals:  Vitals Value Taken Time  BP 163/75 02/04/23 1532  Temp 37.1 C 02/04/23 1532  Pulse 82 02/04/23 1538  Resp 10 02/04/23 1538  SpO2 99 % 02/04/23 1538  Vitals shown include unfiled device data.  Last Pain:  Vitals:   02/04/23 1302  TempSrc: Oral  PainSc:       Patients Stated Pain Goal: 2 (02/04/23 1123)  Complications: No notable events documented.

## 2023-02-04 NOTE — Plan of Care (Signed)
Problem: Education: Goal: Knowledge of General Education information will improve Description: Including pain rating scale, medication(s)/side effects and non-pharmacologic comfort measures Outcome: Progressing   Problem: Pain Managment: Goal: General experience of comfort will improve Outcome: Progressing

## 2023-02-04 NOTE — Hospital Course (Addendum)
67 year old female with GAD, HTN HLD osteoarthritis history of total abdominal hysterectomy 987 pagetoid nausea right and left upper quadrant pain along with diarrhea of 2 days without bleeding per rectum. Seen in the ED underwent imaging study with CT scan showing signs of small bowel obstruction, general surgery was consulted and admitted with NG tube decompression IV fluid hydration.  Labs has been unremarkable with stable renal function CBC. 9/23> status post exploratory laparotomy, found to have very large small bowel mass, not obstructing, sent for biopsy no large palpable lymph nodes seen in the mesentery.  9/25: Patient developed fever today, having some cough.  NG tube remained in place, no BM or passing flatus.  Ordered chest x-ray and blood cultures.  Holding antibiotics for now. Surgery might repeat CT abdomen.  9/26: Recorded fever of 101.2 over the past 24-hour, worsening cough and chest x-ray with concern of right lower lobe infiltrate.  Preliminary blood cultures negative in 24-hour, starting on Unasyn and Zithromax.  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  9/27: Recorded temperature of 100.9 over the past 24-hour, blood cultures remain negative, respiratory cultures pending, labs overall stable with mild hypokalemia which is being repleted. NG tube was removed and started on diet.  Had a bowel movement.  9/28: Remained afebrile over the past 24 hours, developed some nausea and vomiting overnight which she think is due to her worsening GERD as she was not taking home meds which were restarted.  Having bowel movement and passing flatus.  Pathology with marginal zone lymphoma.

## 2023-02-05 ENCOUNTER — Encounter (HOSPITAL_COMMUNITY): Payer: Self-pay | Admitting: Surgery

## 2023-02-05 DIAGNOSIS — K56609 Unspecified intestinal obstruction, unspecified as to partial versus complete obstruction: Secondary | ICD-10-CM | POA: Diagnosis not present

## 2023-02-05 LAB — BASIC METABOLIC PANEL
Anion gap: 10 (ref 5–15)
BUN: 16 mg/dL (ref 8–23)
CO2: 26 mmol/L (ref 22–32)
Calcium: 9.8 mg/dL (ref 8.9–10.3)
Chloride: 106 mmol/L (ref 98–111)
Creatinine, Ser: 0.96 mg/dL (ref 0.44–1.00)
GFR, Estimated: >60 mL/min (ref >60)
Glucose, Bld: 143 mg/dL — ABNORMAL HIGH (ref 70–99)
Potassium: 3.9 mmol/L (ref 3.5–5.1)
Sodium: 142 mmol/L (ref 135–145)

## 2023-02-05 LAB — CBC
HCT: 37.6 % (ref 36.0–46.0)
Hemoglobin: 11.7 g/dL — ABNORMAL LOW (ref 12.0–15.0)
MCH: 30.7 pg (ref 26.0–34.0)
MCHC: 31.1 g/dL (ref 30.0–36.0)
MCV: 98.7 fL (ref 80.0–100.0)
Platelets: 278 10*3/uL (ref 150–400)
RBC: 3.81 MIL/uL — ABNORMAL LOW (ref 3.87–5.11)
RDW: 13.9 % (ref 11.5–15.5)
WBC: 5.9 10*3/uL (ref 4.0–10.5)
nRBC: 0 % (ref 0.0–0.2)

## 2023-02-05 MED ORDER — CHLORHEXIDINE GLUCONATE CLOTH 2 % EX PADS
6.0000 | MEDICATED_PAD | Freq: Every day | CUTANEOUS | Status: DC
  Administered 2023-02-05 – 2023-02-07 (×2): 6 via TOPICAL

## 2023-02-05 MED ORDER — PANTOPRAZOLE SODIUM 40 MG IV SOLR
40.0000 mg | Freq: Once | INTRAVENOUS | Status: AC
  Administered 2023-02-05: 40 mg via INTRAVENOUS
  Filled 2023-02-05: qty 10

## 2023-02-05 NOTE — Plan of Care (Signed)
Problem: Education: Goal: Knowledge of General Education information will improve Description: Including pain rating scale, medication(s)/side effects and non-pharmacologic comfort measures Outcome: Progressing   Problem: Pain Managment: Goal: General experience of comfort will improve Outcome: Progressing

## 2023-02-05 NOTE — Progress Notes (Signed)
PROGRESS NOTE [facility]  NFA:[id number] DOB: 04-07-1956 DOA: 02/03/2023 PCP: Etta Grandchild, MD  Brief Narrative/Hospital Course: 67 year old female with GAD, HTN HLD osteoarthritis history of total abdominal hysterectomy 987 pagetoid nausea right and left upper quadrant pain along with diarrhea of 2 days without bleeding per rectum. Seen in the ED underwent imaging study with CT scan showing signs of small bowel obstruction, general surgery was consulted and admitted with NG tube decompression IV fluid hydration.  Labs has been unremarkable with stable renal function CBC. 9/23> status post exploratory laparotomy, found to have very large small bowel mass, not obstructing, sent for biopsy no large palpable lymph nodes seen in the mesentery.    Subjective: Patient seen and examined this morning She is resting comfortably although having some abdominal pain NG tube remains in place Past 24 hours afebrile.  BP stable.  Labs unremarkable this morning   Assessment and Plan: Principal Problem:   SBO (small bowel obstruction) (HCC) Active Problems:   Dyslipidemia, goal LDL below 130   Essential hypertension, benign   Gastroesophageal reflux disease without esophagitis   Stage 3a chronic kidney disease (HCC)   Small bowel obstruction Small bowel mass: 9/23> status post exploratory laparotomy, found to have very large small bowel mass, not obstructing, sent for biopsy no large palpable lymph nodes seen in the mesentery.  Continue plan per surgery follow-up pathology, remains n.p.o.Keep on IVF, ngt to LWIS. Cont  PT OT,pain management-tylenol/Robaxin, prn iv morphine  GERD: Stable on PPI  Essential hypertension: BP remains controlled on prn HLD: Statin on hold as n.p.o. CKD stage II: Based on GFR patient has CKD stage II, renal function remains stable and at baseline, monitor.  Obesity:Patient's Body mass index is 34.63 kg/m. : Will benefit with PCP follow-up, weight loss and  healthy lifestyle  DVT prophylaxis: Place and maintain sequential compression device Start: 02/04/23 1050scd added-start chemical prophylaxis once okay with surgery post op Code Status:   Code Status: Full Code Family Communication: plan of care discussed with patient  Patient status is: Inpatient because of SBO Level of care: Med-Surg   Dispo: The patient is from: Home            Anticipated disposition: tbd  Objective: Vitals last 24 hrs: Vitals:   02/04/23 1929 02/04/23 2033 02/05/23 0137 02/05/23 0510  BP: 123/63 129/77 (!) 121/59 (!) 148/67  Pulse: 87 91 82 92  Resp: 19 18 17 16   Temp: 98.2 F (36.8 C) 98 F (36.7 C) (!) 97.5 F (36.4 C) 98.8 F (37.1 C)  TempSrc: Oral Oral Oral Oral  SpO2: 96% 91% 100% 92%  Weight:      Height:       Weight change: 0 kg  Physical Examination: General exam: alert awake, oriented x3, ngt+ HEENT:Oral mucosa moist, Ear/Nose WNL grossly Respiratory system: Bilaterally clear BS,no use of accessory muscle Cardiovascular system: S1 & S2 +, No JVD. Gastrointestinal system: Abdomen soft, tender, dressing intact on surgical site Nervous System: Alert, awake, moving all extremities,and following commands. Extremities: LE edema neg,distal peripheral pulses palpable and warm.  Skin: No rashes,no icterus. MSK: Normal muscle bulk,tone, power   Medications reviewed:  Scheduled Meds:  Chlorhexidine Gluconate Cloth  6 each Topical Daily   Continuous Infusions:  dextrose 5% lactated ringers 100 mL/hr at 02/05/23 0413   methocarbamol (ROBAXIN) IV 500 mg (02/05/23 4696)    Diet Order             Diet NPO time specified  Diet  effective now                  Intake/Output Summary (Last 24 hours) at 02/05/2023 1158 Last data filed at 02/05/2023 1000 Gross per 24 hour  Intake 2833.14 ml  Output 1795 ml  Net 1038.14 ml   Net IO Since Admission: 2,513.91 mL [02/05/23 1158]  Wt Readings from Last 3 Encounters:  02/04/23 91.5 kg  10/17/22  91.6 kg  04/18/22 95.7 kg     Unresulted Labs (From admission, onward)     Start     Ordered   02/05/23 0500  Basic metabolic panel  Daily,   R      02/04/23 0841   02/05/23 0500  CBC  Daily,   R      02/04/23 0841          Data Reviewed: I have personally reviewed following labs and imaging studies CBC: Recent Labs  Lab 02/03/23 1516 02/04/23 0416 02/05/23 0438  WBC 5.4 3.8* 5.9  NEUTROABS 3.3  --   --   HGB 13.9 12.3 11.7*  HCT 43.8 39.3 37.6  MCV 96.5 98.7 98.7  PLT 350 325 278   Basic Metabolic Panel: Recent Labs  Lab 02/03/23 1516 02/04/23 0416 02/05/23 0438  NA 139 139 142  K 4.0 3.9 3.9  CL 105 107 106  CO2 25 24 26   GLUCOSE 99 135* 143*  BUN 23 18 16   CREATININE 1.17* 0.84 0.96  CALCIUM 10.0 9.8 9.8   GFR: Estimated Creatinine Clearance: 60.6 mL/min (by C-G formula based on SCr of 0.96 mg/dL). Liver Function Tests: Recent Labs  Lab 02/03/23 1516 02/04/23 0416  AST 18 14*  ALT 14 12  ALKPHOS 90 75  BILITOT 0.9 1.0  PROT 7.7 7.1  ALBUMIN 3.7 3.3*   Recent Labs  Lab 02/03/23 1516  LIPASE 29  Antimicrobials: Anti-infectives (From admission, onward)    Start     Dose/Rate Route Frequency Ordered Stop   02/04/23 0930  cefOXitin (MEFOXIN) 2 g in sodium chloride 0.9 % 100 mL IVPB        2 g 200 mL/hr over 30 Minutes Intravenous  Once 02/04/23 0844 02/04/23 1011      Culture/Microbiology No results found for: "SDES", "SPECREQUEST", "CULT", "REPTSTATUS"  Radiology Studies: DG Abd Portable 1V-Small Bowel Obstruction Protocol-initial, 8 hr delay  Result Date: 02/04/2023 CLINICAL DATA:  Follow-up small bowel obstruction. 8 hour delay imaging. EXAM: PORTABLE ABDOMEN - 1 VIEW COMPARISON:  02/03/2023 . FINDINGS: there is an enteric tube with tip and side port in the stomach. Persistent dilated small bowel loops are identified which measure up to 3.8 cm. Gas and stool noted within normal caliber colon. IMPRESSION: Persistent small bowel obstruction  pattern. Electronically Signed   By: Signa Kell M.D.   On: 02/04/2023 05:43   DG Abd Portable 1V-Small Bowel Protocol-Position Verification  Result Date: 02/03/2023 CLINICAL DATA:  Enteric tube placement EXAM: PORTABLE ABDOMEN - 1 VIEW COMPARISON:  Same day CT abdomen and pelvis FINDINGS: Gastric/enteric tube tip projects over the stomach. Partially imaged dilated small bowel loops in the right hemiabdomen. IMPRESSION: Gastric/enteric tube tip projects over the stomach. Electronically Signed   By: Agustin Cree M.D.   On: 02/03/2023 19:27   CT ABDOMEN PELVIS W CONTRAST  Result Date: 02/03/2023 CLINICAL DATA:  Bilateral upper quadrant pain. Nausea, vomiting, and diarrhea for 2 days. EXAM: CT ABDOMEN AND PELVIS WITH CONTRAST TECHNIQUE: Multidetector CT imaging of the abdomen and pelvis was performed  using the standard protocol following bolus administration of intravenous contrast. RADIATION DOSE REDUCTION: This exam was performed according to the departmental dose-optimization program which includes automated exposure control, adjustment of the mA and/or kV according to patient size and/or use of iterative reconstruction technique. CONTRAST:  OMNIPAQUE IOHEXOL 300 MG/ML  SOLN COMPARISON:  07/10/2013 FINDINGS: Lower Chest: No acute findings. Hepatobiliary: No suspicious hepatic masses identified. Gallbladder is unremarkable. No evidence of biliary ductal dilatation. Pancreas:  No mass or inflammatory changes. Spleen: Within normal limits in size and appearance. Adrenals/Urinary Tract: No suspicious masses identified. No evidence of ureteral calculi or hydronephrosis. Stomach/Bowel: Multiple moderately dilated small bowel loops are seen. A transition point is seen in the central small bowel mesentery which shows tethering, suspicious for adhesion. A markedly dilated small bowel loop is seen in the left abdomen which shows wall thickening and is suspicious for a closed loop obstruction. No evidence of  pneumatosis, free intraperitoneal air, or abscess. Normal appendix visualized. Diverticulosis is seen mainly involving the descending and sigmoid colon, however there is no evidence of diverticulitis. Vascular/Lymphatic: No pathologically enlarged lymph nodes. No acute vascular findings. Reproductive: Prior hysterectomy noted. Adnexal regions are unremarkable in appearance. Tiny amount of free fluid noted in pelvic cul-de-sac. Other:  None. Musculoskeletal:  No suspicious bone lesions identified. IMPRESSION: Small bowel obstruction, with transition point in central small bowel mesentery suspicious for adhesion. Markedly dilated small bowel loop in left abdomen mild wall thickening, raising suspicion for closed loop obstruction. Surgical consultation is recommended. No evidence of pneumatosis, free air, or abscess. Colonic diverticulosis, without radiographic evidence of diverticulitis. Electronically Signed   By: Danae Orleans M.D.   On: 02/03/2023 17:36    LOS: 2 days  Lanae Boast, MD [facility]  02/05/2023, 11:58 AM

## 2023-02-05 NOTE — Progress Notes (Signed)
Mobility Specialist - Progress Note   02/05/23 0938  Mobility  Activity Ambulated with assistance in hallway  Level of Assistance Modified independent, requires aide device or extra time  Assistive Device Other (Comment) (IV Pole)  Distance Ambulated (ft) 80 ft  Activity Response Tolerated well  Mobility Referral Yes  $Mobility charge 1 Mobility  Mobility Specialist Start Time (ACUTE ONLY) 0910  Mobility Specialist Stop Time (ACUTE ONLY) [id number]  Mobility Specialist Time Calculation (min) (ACUTE ONLY) 27 min   Pt received in bed and agreeable to mobility. Pt  was minA from supine to sitting. MinA from STS. No complaints during session. Upon returning to room, pt required minA back into bed. Pt to bed after session with all needs met.   [facility]

## 2023-02-05 NOTE — Progress Notes (Addendum)
[facility] Progress Note  1 Day Post-Op  Subjective: CC:  Reports some pain with movement but is currently sitting EOB with mobility tech. Pain is controlled with morphine every 2-3 hours. Denies flatus or BM.   Objective: Vital signs in last 24 hours: Temp:  [97.5 F (36.4 C)-98.8 F (37.1 C)] 98.8 F (37.1 C) (09/24 0510) Pulse Rate:  [79-92] 92 (09/24 0510) Resp:  [13-24] 16 (09/24 0510) BP: (121-163)/(59-82) 148/67 (09/24 0510) SpO2:  [91 %-100 %] 92 % (09/24 0510) Weight:  [91.5 kg] 91.5 kg (09/23 1335) Last BM Date : 02/03/23  Intake/Output from previous day: 09/23 0701 - 09/24 0700 In: 3254.5 [I.V.:2286; IV Piggyback:968.5] Out: 2245 [Urine:950; Emesis/NG output:270; Blood:25] Intake/Output this shift: No intake/output data recorded.  PE: Gen:  Alert, NAD, pleasant Card:  Regular rate Pulm:  Normal effort Abd: Soft, no significant distention, appropriately tender, NG with dark bilious effluent/low output ~300 cc GU: foley in place draining clear yellow urine  Skin: warm and dry, no rashes  Psych: A&Ox3   Lab Results:  Recent Labs    02/04/23 0416 02/05/23 0438  WBC 3.8* 5.9  HGB 12.3 11.7*  HCT 39.3 37.6  PLT 325 278   BMET Recent Labs    02/04/23 0416 02/05/23 0438  NA 139 142  K 3.9 3.9  CL 107 106  CO2 24 26  GLUCOSE 135* 143*  BUN 18 16  CREATININE 0.84 0.96  CALCIUM 9.8 9.8   PT/INR No results for input(s): "LABPROT", "INR" in the last 72 hours. CMP     Component Value Date/Time   NA 142 02/05/2023 0438   NA 145 (H) 07/29/2018 1112   K 3.9 02/05/2023 0438   CL 106 02/05/2023 0438   CO2 26 02/05/2023 0438   GLUCOSE 143 (H) 02/05/2023 0438   BUN 16 02/05/2023 0438   BUN 8 07/29/2018 1112   CREATININE 0.96 02/05/2023 0438   CREATININE 0.87 08/03/2015 0942   CALCIUM 9.8 02/05/2023 0438   PROT 7.1 02/04/2023 0416   PROT 7.0 07/29/2018 1112   ALBUMIN 3.3 (L) 02/04/2023 0416   ALBUMIN 4.2 07/29/2018 1112   AST 14 (L)  02/04/2023 0416   ALT 12 02/04/2023 0416   ALKPHOS 75 02/04/2023 0416   BILITOT 1.0 02/04/2023 0416   BILITOT 0.5 07/29/2018 1112   GFRNONAA >60 02/05/2023 0438   GFRNONAA 67 06/18/2014 1714   GFRAA 75 07/29/2018 1112   GFRAA 77 06/18/2014 1714   Lipase     Component Value Date/Time   LIPASE 29 02/03/2023 1516       Studies/Results: DG Abd Portable 1V-Small Bowel Obstruction Protocol-initial, 8 hr delay  Result Date: 02/04/2023 CLINICAL DATA:  Follow-up small bowel obstruction. 8 hour delay imaging. EXAM: PORTABLE ABDOMEN - 1 VIEW COMPARISON:  02/03/2023 . FINDINGS: there is an enteric tube with tip and side port in the stomach. Persistent dilated small bowel loops are identified which measure up to 3.8 cm. Gas and stool noted within normal caliber colon. IMPRESSION: Persistent small bowel obstruction pattern. Electronically Signed   By: Signa Kell M.D.   On: 02/04/2023 05:43   DG Abd Portable 1V-Small Bowel Protocol-Position Verification  Result Date: 02/03/2023 CLINICAL DATA:  Enteric tube placement EXAM: PORTABLE ABDOMEN - 1 VIEW COMPARISON:  Same day CT abdomen and pelvis FINDINGS: Gastric/enteric tube tip projects over the stomach. Partially imaged dilated small bowel loops in the right hemiabdomen. IMPRESSION: Gastric/enteric tube tip projects over the stomach. Electronically Signed   By:  Agustin Cree M.D.   On: 02/03/2023 19:27   CT ABDOMEN PELVIS W CONTRAST  Result Date: 02/03/2023 CLINICAL DATA:  Bilateral upper quadrant pain. Nausea, vomiting, and diarrhea for 2 days. EXAM: CT ABDOMEN AND PELVIS WITH CONTRAST TECHNIQUE: Multidetector CT imaging of the abdomen and pelvis was performed using the standard protocol following bolus administration of intravenous contrast. RADIATION DOSE REDUCTION: This exam was performed according to the departmental dose-optimization program which includes automated exposure control, adjustment of the mA and/or kV according to patient size and/or  use of iterative reconstruction technique. CONTRAST:  OMNIPAQUE IOHEXOL 300 MG/ML  SOLN COMPARISON:  07/10/2013 FINDINGS: Lower Chest: No acute findings. Hepatobiliary: No suspicious hepatic masses identified. Gallbladder is unremarkable. No evidence of biliary ductal dilatation. Pancreas:  No mass or inflammatory changes. Spleen: Within normal limits in size and appearance. Adrenals/Urinary Tract: No suspicious masses identified. No evidence of ureteral calculi or hydronephrosis. Stomach/Bowel: Multiple moderately dilated small bowel loops are seen. A transition point is seen in the central small bowel mesentery which shows tethering, suspicious for adhesion. A markedly dilated small bowel loop is seen in the left abdomen which shows wall thickening and is suspicious for a closed loop obstruction. No evidence of pneumatosis, free intraperitoneal air, or abscess. Normal appendix visualized. Diverticulosis is seen mainly involving the descending and sigmoid colon, however there is no evidence of diverticulitis. Vascular/Lymphatic: No pathologically enlarged lymph nodes. No acute vascular findings. Reproductive: Prior hysterectomy noted. Adnexal regions are unremarkable in appearance. Tiny amount of free fluid noted in pelvic cul-de-sac. Other:  None. Musculoskeletal:  No suspicious bone lesions identified. IMPRESSION: Small bowel obstruction, with transition point in central small bowel mesentery suspicious for adhesion. Markedly dilated small bowel loop in left abdomen mild wall thickening, raising suspicion for closed loop obstruction. Surgical consultation is recommended. No evidence of pneumatosis, free air, or abscess. Colonic diverticulosis, without radiographic evidence of diverticulitis. Electronically Signed   By: Danae Orleans M.D.   On: 02/03/2023 17:36    Anti-infectives: Anti-infectives (From admission, onward)    Start     Dose/Rate Route Frequency Ordered Stop   02/04/23 0930  cefOXitin  (MEFOXIN) 2 g in sodium chloride 0.9 % 100 mL IVPB        2 g 200 mL/hr over 30 Minutes Intravenous  Once 02/04/23 0844 02/04/23 1011        Assessment/Plan  SBO  S/p exploratory laparotomy, small bowel resection 9/23 DB POD#1 AFVSS  Await surgical path   WBC 5.9, hgb 11.7 from 12.3 - overall stable Await bowel function, continue NG to LIWS today  D/C foley   LOS: 2 days   I reviewed nursing notes, hospitalist notes, last 24 h vitals and pain scores, last 48 h intake and output, last 24 h labs and trends, and last 24 h imaging results.  Hosie Spangle, PA-C [facility] Please see Amion for pager number during day hours 7:00am-4:30pm

## 2023-02-05 NOTE — Plan of Care (Signed)
  Problem: Education: Goal: Knowledge of General Education information will improve Description Including pain rating scale, medication(s)/side effects and non-pharmacologic comfort measures Outcome: Progressing   Problem: Health Behavior/Discharge Planning: Goal: Ability to manage health-related needs will improve Outcome: Progressing   

## 2023-02-05 NOTE — Progress Notes (Signed)
NP Chinita Greenland was secured chat because patient is c/o heart burn.

## 2023-02-05 NOTE — Progress Notes (Signed)
   02/05/23 1451  TOC Brief Assessment  Insurance and Status Reviewed  Patient has primary care physician Yes  Home environment has been reviewed home alone  Prior level of function: independent  Prior/Current Home Services No current home services  Social Determinants of Health Reivew SDOH reviewed no interventions necessary  Readmission risk has been reviewed Yes  Transition of care needs no transition of care needs at this time

## 2023-02-06 ENCOUNTER — Inpatient Hospital Stay (HOSPITAL_COMMUNITY): Payer: Medicare Other

## 2023-02-06 DIAGNOSIS — K56609 Unspecified intestinal obstruction, unspecified as to partial versus complete obstruction: Secondary | ICD-10-CM | POA: Diagnosis not present

## 2023-02-06 DIAGNOSIS — I10 Essential (primary) hypertension: Secondary | ICD-10-CM | POA: Diagnosis not present

## 2023-02-06 DIAGNOSIS — N1831 Chronic kidney disease, stage 3a: Secondary | ICD-10-CM

## 2023-02-06 DIAGNOSIS — E785 Hyperlipidemia, unspecified: Secondary | ICD-10-CM | POA: Diagnosis not present

## 2023-02-06 DIAGNOSIS — K219 Gastro-esophageal reflux disease without esophagitis: Secondary | ICD-10-CM | POA: Diagnosis not present

## 2023-02-06 LAB — BASIC METABOLIC PANEL
Anion gap: 7 (ref 5–15)
BUN: 14 mg/dL (ref 8–23)
CO2: 27 mmol/L (ref 22–32)
Calcium: 9.1 mg/dL (ref 8.9–10.3)
Chloride: 105 mmol/L (ref 98–111)
Creatinine, Ser: 0.84 mg/dL (ref 0.44–1.00)
GFR, Estimated: >60 mL/min (ref >60)
Glucose, Bld: 263 mg/dL — ABNORMAL HIGH (ref 70–99)
Potassium: 3.4 mmol/L — ABNORMAL LOW (ref 3.5–5.1)
Sodium: 139 mmol/L (ref 135–145)

## 2023-02-06 LAB — EXPECTORATED SPUTUM ASSESSMENT W GRAM STAIN, RFLX TO RESP C

## 2023-02-06 LAB — CBC
HCT: 35.9 % — ABNORMAL LOW (ref 36.0–46.0)
Hemoglobin: 11.2 g/dL — ABNORMAL LOW (ref 12.0–15.0)
MCH: 30.5 pg (ref 26.0–34.0)
MCHC: 31.2 g/dL (ref 30.0–36.0)
MCV: 97.8 fL (ref 80.0–100.0)
Platelets: 279 10*3/uL (ref 150–400)
RBC: 3.67 MIL/uL — ABNORMAL LOW (ref 3.87–5.11)
RDW: 14 % (ref 11.5–15.5)
WBC: 6.8 10*3/uL (ref 4.0–10.5)
nRBC: 0 % (ref 0.0–0.2)

## 2023-02-06 MED ORDER — ACETAMINOPHEN 325 MG PO TABS
650.0000 mg | ORAL_TABLET | Freq: Four times a day (QID) | ORAL | Status: DC | PRN
  Administered 2023-02-06: 650 mg via ORAL
  Filled 2023-02-06: qty 2

## 2023-02-06 MED ORDER — METHOCARBAMOL 1000 MG/10ML IJ SOLN
1000.0000 mg | Freq: Four times a day (QID) | INTRAVENOUS | Status: DC
  Administered 2023-02-06 – 2023-02-12 (×24): 1000 mg via INTRAVENOUS
  Filled 2023-02-06 (×13): qty 1000
  Filled 2023-02-06: qty 10
  Filled 2023-02-06 (×5): qty 1000
  Filled 2023-02-06: qty 10
  Filled 2023-02-06 (×3): qty 1000
  Filled 2023-02-06: qty 10
  Filled 2023-02-06: qty 1000
  Filled 2023-02-06: qty 10

## 2023-02-06 MED ORDER — POTASSIUM CHLORIDE 20 MEQ PO PACK
60.0000 meq | PACK | Freq: Once | ORAL | Status: AC
  Administered 2023-02-06: 60 meq via ORAL
  Filled 2023-02-06: qty 3

## 2023-02-06 MED ORDER — POTASSIUM CHLORIDE 20 MEQ PO PACK: 60.0000 meq | PACK | Freq: Once | ORAL | Status: DC

## 2023-02-06 MED ORDER — ENOXAPARIN SODIUM 40 MG/0.4ML IJ SOSY
40.0000 mg | PREFILLED_SYRINGE | INTRAMUSCULAR | Status: DC
  Administered 2023-02-06 – 2023-02-15 (×10): 40 mg via SUBCUTANEOUS
  Filled 2023-02-06 (×10): qty 0.4

## 2023-02-06 MED ORDER — ACETAMINOPHEN 650 MG RE SUPP: 650.0000 mg | RECTAL | Status: DC | PRN

## 2023-02-06 NOTE — Plan of Care (Signed)
  Problem: Education: Goal: Knowledge of General Education information will improve Description: Including pain rating scale, medication(s)/side effects and non-pharmacologic comfort measures Outcome: Progressing   Problem: Pain Managment: Goal: General experience of comfort will improve Outcome: Progressing   

## 2023-02-06 NOTE — Progress Notes (Signed)
PROGRESS NOTE Kayla Lowe  ZOX:[id number] DOB: [birth year] DOA: 02/03/2023 PCP: Etta Grandchild, MD  Brief Narrative/Hospital Course: 67 year old female with GAD, HTN HLD osteoarthritis history of total abdominal hysterectomy 987 pagetoid nausea right and left upper quadrant pain along with diarrhea of 2 days without bleeding per rectum. Seen in the ED underwent imaging study with CT scan showing signs of small bowel obstruction, general surgery was consulted and admitted with NG tube decompression IV fluid hydration.  Labs has been unremarkable with stable renal function CBC. 9/23> status post exploratory laparotomy, found to have very large small bowel mass, not obstructing, sent for biopsy no large palpable lymph nodes seen in the mesentery.  9/25: Patient developed fever today, having some cough.  NG tube remained in place, no BM or passing flatus.  Ordered chest x-ray and blood cultures.  Holding antibiotics for now. Surgery might repeat CT abdomen.    Subjective: Patient was seen and examined today.  At that time she was comfortable, no pain.  No bowel movement or passing flatus.  NG tube was in place. Later received message from nursing staff that she became febrile.   Assessment and Plan: Principal Problem:   SBO (small bowel obstruction) (HCC) Active Problems:   Dyslipidemia, goal LDL below 130   Essential hypertension, benign   Gastroesophageal reflux disease without esophagitis   Stage 3a chronic kidney disease (HCC)   Small bowel obstruction Small bowel mass: 9/23> status post exploratory laparotomy, found to have very large small bowel mass, not obstructing, sent for biopsy no large palpable lymph nodes seen in the mesentery.  Continue plan per surgery follow-up pathology, remains n.p.o.Keep on IVF, ngt to LWIS. Cont  PT OT,pain management-tylenol/Robaxin, prn iv morphine  Fever.  Can be due to recent surgery.  Having mild cough.  No leukocytosis. -Obtain chest  x-ray -Some Tylenol ordered -Blood cultures -Surgery might repeat CT abdomen -Continue to monitor  GERD: Stable on PPI  Essential hypertension: BP remains controlled on prn HLD: Statin on hold as n.p.o. CKD stage II: Based on GFR patient has CKD stage II, renal function remains stable and at baseline, monitor.  Obesity:Patient's Body mass index is 34.63 kg/m. : Will benefit with PCP follow-up, weight loss and healthy lifestyle  DVT prophylaxis: enoxaparin (LOVENOX) injection 40 mg Start: 02/06/23 1200 Place and maintain sequential compression device Start: 02/04/23 1050scd added-start chemical prophylaxis once okay with surgery post op Code Status:   Code Status: Full Code Family Communication: plan of care discussed with patient  Patient status is: Inpatient because of SBO Level of care: Med-Surg   Dispo: The patient is from: Home            Anticipated disposition: tbd  Objective: Vitals last 24 hrs: Vitals:   02/05/23 2139 02/06/23 0117 02/06/23 0648 02/06/23 1337  BP:  (!) 145/72 137/77 131/67  Pulse:  99 98 (!) 103  Resp:  18 18 19   Temp: 99.3 F (37.4 C) 99.3 F (37.4 C) 99.9 F (37.7 C) (!) 101.2 F (38.4 C)  TempSrc: Oral Oral Oral Oral  SpO2:  95% 95% 91%  Weight:      Height:       Weight change:   Physical Examination: General.  Obese lady, in no acute distress.  NG tube in place Pulmonary.  Lungs clear bilaterally, normal respiratory effort. CV.  Regular rate and rhythm, no JVD, rub or murmur. Abdomen.  Soft, nontender, nondistended, BS positive, slightly decreased on LLQ, midline laparotomy dressing, CNS.  Alert and oriented .  No focal neurologic deficit. Extremities.  No edema, no cyanosis, pulses intact and symmetrical. Psychiatry.  Judgment and insight appears normal.   Medications reviewed:  Scheduled Meds:  Chlorhexidine Gluconate Cloth  6 each Topical Daily   enoxaparin (LOVENOX) injection  40 mg Subcutaneous Q24H   Continuous  Infusions:  dextrose 5% lactated ringers 100 mL/hr at 02/06/23 1242   methocarbamol (ROBAXIN) IV 1,000 mg (02/06/23 1107)    Diet Order             Diet NPO time specified  Diet effective now                  Intake/Output Summary (Last 24 hours) at 02/06/2023 1529 Last data filed at 02/06/2023 1400 Gross per 24 hour  Intake 2704.01 ml  Output 2000 ml  Net 704.01 ml   Net IO Since Admission: 3,601.78 mL [02/06/23 1529]  Wt Readings from Last 3 Encounters:  02/04/23 91.5 kg  10/17/22 91.6 kg  04/18/22 95.7 kg     Unresulted Labs (From admission, onward)     Start     Ordered   02/06/23 1510  Expectorated Sputum Assessment w Gram Stain, Rflx to Resp Cult  Once,   R        02/06/23 1510   02/06/23 1500  Culture, blood (Routine X 2) w Reflex to ID Panel  BLOOD CULTURE X 2,   R      02/06/23 1459   02/05/23 0500  Basic metabolic panel  Daily,   R      02/04/23 0841   02/05/23 0500  CBC  Daily,   R      02/04/23 0841          Data Reviewed: I have personally reviewed following labs and imaging studies CBC: Recent Labs  Lab 02/03/23 1516 02/04/23 0416 02/05/23 0438 02/06/23 0430  WBC 5.4 3.8* 5.9 6.8  NEUTROABS 3.3  --   --   --   HGB 13.9 12.3 11.7* 11.2*  HCT 43.8 39.3 37.6 35.9*  MCV 96.5 98.7 98.7 97.8  PLT 350 325 278 279   Basic Metabolic Panel: Recent Labs  Lab 02/03/23 1516 02/04/23 0416 02/05/23 0438 02/06/23 0430  NA 139 139 142 139  K 4.0 3.9 3.9 3.4*  CL 105 107 106 105  CO2 25 24 26 27   GLUCOSE 99 135* 143* 263*  BUN 23 18 16 14   CREATININE 1.17* 0.84 0.96 0.84  CALCIUM 10.0 9.8 9.8 9.1   GFR: Estimated Creatinine Clearance: 69.3 mL/min (by C-G formula based on SCr of 0.84 mg/dL). Liver Function Tests: Recent Labs  Lab 02/03/23 1516 02/04/23 0416  AST 18 14*  ALT 14 12  ALKPHOS 90 75  BILITOT 0.9 1.0  PROT 7.7 7.1  ALBUMIN 3.7 3.3*   Recent Labs  Lab 02/03/23 1516  LIPASE 29  Antimicrobials: Anti-infectives (From  admission, onward)    Start     Dose/Rate Route Frequency Ordered Stop   02/04/23 0930  cefOXitin (MEFOXIN) 2 g in sodium chloride 0.9 % 100 mL IVPB        2 g 200 mL/hr over 30 Minutes Intravenous  Once 02/04/23 0844 02/04/23 1011      Culture/Microbiology No results found for: "SDES", "SPECREQUEST", "CULT", "REPTSTATUS"  Radiology Studies: DG Abd Portable 1V  Result Date: 02/06/2023 CLINICAL DATA:  Readjustment of NG tube. EXAM: PORTABLE ABDOMEN - 1 VIEW COMPARISON:  Earlier same day FINDINGS: Interval advancement  of enteric tube with tip and side port now projecting over the expected location of the gastric fundus. Otherwise, stable examination. IMPRESSION: 1. Interval advancement of enteric tube with tip and side port now projecting over the expected location of the gastric fundus. 2. Otherwise, stable examination, as described on abdominal radiograph performed earlier same day. Electronically Signed   By: Simonne Come M.D.   On: 02/06/2023 11:50   DG Abd Portable 1V  Result Date: 02/06/2023 CLINICAL DATA:  Confirm NG tube placement. Postoperative nausea and vomiting. EXAM: PORTABLE ABDOMEN - 1 VIEW COMPARISON:  Earlier same day; 02/04/2023 FINDINGS: Interval retraction enteric tube with tip and side port projected over the distal esophagus. Advancement at least 17 cm is advised. Redemonstrated moderate-to-marked gaseous distention of multiple loops of small bowel with index loop of small bowel within the right mid abdomen measuring 5.3 cm in diameter. Midline abdominal staples. Limited visualization of the lower thorax demonstrates trace right-sided pleural effusion with bibasilar heterogeneous/consolidative opacities, right-greater-than-left. No acute osseous abnormalities. Mild scoliotic curvature of the thoracolumbar spine with associated multilevel DDD, incompletely evaluated. IMPRESSION: 1. Interval retraction enteric tube with tip and side port projected over the distal esophagus.  Advancement at least 17 cm is advised. 2. Redemonstrated moderate-to-marked gaseous distention of multiple loops of small bowel, nonspecific though worrisome for small bowel obstruction. 3. Trace right-sided pleural effusion with bibasilar heterogeneous/consolidative opacities, right-greater-than-left, atelectasis versus infiltrate. Electronically Signed   By: Simonne Come M.D.   On: 02/06/2023 11:49    LOS: 3 days  Arnetha Courser, MD [facility]  02/06/2023, 3:29 PM

## 2023-02-06 NOTE — Progress Notes (Signed)
   02/06/23 1337  Assess: MEWS Score  Temp (!) 101.2 F (38.4 C)  BP 131/67  Pulse Rate (!) 103  Resp 19  SpO2 91 %  O2 Device Room Air  Assess: MEWS Score  MEWS Temp 1  MEWS Systolic 0  MEWS Pulse 1  MEWS RR 0  MEWS LOC 0  MEWS Score 2  MEWS Score Color Yellow  Assess: if the MEWS score is Yellow or Red  Were vital signs accurate and taken at a resting state? Yes  Does the patient meet 2 or more of the SIRS criteria? Yes  Does the patient have a confirmed or suspected source of infection? No  MEWS guidelines implemented  Yes, yellow  Treat  MEWS Interventions Considered administering scheduled or prn medications/treatments as ordered  Take Vital Signs  Increase Vital Sign Frequency  Yellow: Q2hr x1, continue Q4hrs until patient remains green for 12hrs  Escalate  MEWS: Escalate Yellow: Discuss with charge nurse and consider notifying provider and/or RRT  Notify: Charge Nurse/RN  Name of Charge Nurse/RN Notified Cristy, RN  Provider Notification  Provider Name/Title Arnetha Courser, MD  Date Provider Notified 02/06/23  Time Provider Notified 1449  Method of Notification Page  Notification Reason Other (Comment) (yellow mews, elevated temp)  Provider response See new orders  Date of Provider Response 02/06/23  Time of Provider Response 1458  Notify: Rapid Response  Name of Rapid Response RN Notified N/A  Assess: SIRS CRITERIA  SIRS Temperature  1  SIRS Pulse 1  SIRS Respirations  0  SIRS WBC 0  SIRS Score Sum  2   PRN tylenol ordered for pt.  Will give and recheck pt's temp.

## 2023-02-06 NOTE — Progress Notes (Addendum)
[facility] Progress Note  2 Days Post-Op  Subjective: CC: c/o phlegm this morning.  Reports pain controlled with morphine. Was able to walk in the hall yesterday and today. Reports mild nausea this AM. No flatus/BM yet.   Objective: Vital signs in last 24 hours: Temp:  [99.2 F (37.3 C)-100.4 F (38 C)] 99.9 F (37.7 C) (09/25 0648) Pulse Rate:  [86-99] 98 (09/25 0648) Resp:  [17-18] 18 (09/25 0648) BP: (137-145)/(58-77) 137/77 (09/25 0648) SpO2:  [90 %-100 %] 95 % (09/25 0648) Last BM Date : 02/03/23  Intake/Output from previous day: 09/24 0701 - 09/25 0700 In: 2614.1 [P.O.:240; I.V.:2037.8; IV Piggyback:336.3] Out: 1700 [Urine:1500; Emesis/NG output:200] Intake/Output this shift: Total I/O In: 30 [P.O.:30] Out: 200 [Urine:150; Emesis/NG output:50]  PE: Gen:  Alert, NAD, pleasant Card:  Regular rate Pulm:  Normal effort, ~500 cc on IS Abd: Soft, mild distention, NGT low output, some belching GU: foley in place draining clear yellow urine  Skin: warm and dry, no rashes  Psych: A&Ox3   Lab Results:  Recent Labs    02/05/23 0438 02/06/23 0430  WBC 5.9 6.8  HGB 11.7* 11.2*  HCT 37.6 35.9*  PLT 278 279   BMET Recent Labs    02/05/23 0438 02/06/23 0430  NA 142 139  K 3.9 3.4*  CL 106 105  CO2 26 27  GLUCOSE 143* 263*  BUN 16 14  CREATININE 0.96 0.84  CALCIUM 9.8 9.1   PT/INR No results for input(s): "LABPROT", "INR" in the last 72 hours. CMP     Component Value Date/Time   NA 139 02/06/2023 0430   NA 145 (H) 07/29/2018 1112   K 3.4 (L) 02/06/2023 0430   CL 105 02/06/2023 0430   CO2 27 02/06/2023 0430   GLUCOSE 263 (H) 02/06/2023 0430   BUN 14 02/06/2023 0430   BUN 8 07/29/2018 1112   CREATININE 0.84 02/06/2023 0430   CREATININE 0.87 08/03/2015 0942   CALCIUM 9.1 02/06/2023 0430   PROT 7.1 02/04/2023 0416   PROT 7.0 07/29/2018 1112   ALBUMIN 3.3 (L) 02/04/2023 0416   ALBUMIN 4.2 07/29/2018 1112   AST 14 (L) 02/04/2023 0416    ALT 12 02/04/2023 0416   ALKPHOS 75 02/04/2023 0416   BILITOT 1.0 02/04/2023 0416   BILITOT 0.5 07/29/2018 1112   GFRNONAA >60 02/06/2023 0430   GFRNONAA 67 06/18/2014 1714   GFRAA 75 07/29/2018 1112   GFRAA 77 06/18/2014 1714   Lipase     Component Value Date/Time   LIPASE 29 02/03/2023 1516       Studies/Results: No results found.  Anti-infectives: Anti-infectives (From admission, onward)    Start     Dose/Rate Route Frequency Ordered Stop   02/04/23 0930  cefOXitin (MEFOXIN) 2 g in sodium chloride 0.9 % 100 mL IVPB        2 g 200 mL/hr over 30 Minutes Intravenous  Once 02/04/23 0844 02/04/23 1011        Assessment/Plan  SBO  S/p exploratory laparotomy, small bowel resection 9/23 DB POD#2 AFVSS  Await surgical path  Patient with some distention, nausea, and belching this AM. I got an X-ray to confirm NG placement and look at bowel gas pattern. NGT was in the esophagus. Advanced by me into the stomach and repeat KUB pending. Monitor NG output. Consider removing if output remains low.  Await bowel function Replete K (3.4), check Mg Foley removed and pt voiding independently.  Start daily lovenox for DVT ppx  LOS: 3 days   I reviewed nursing notes, hospitalist notes, last 24 h vitals and pain scores, last 48 h intake and output, last 24 h labs and trends, and last 24 h imaging results.  Hosie Spangle, PA-C [facility] Please see Amion for pager number during day hours 7:00am-4:30pm

## 2023-02-06 NOTE — Progress Notes (Signed)
Mobility Specialist - Progress Note   02/06/23 0904  Mobility  Activity Ambulated with assistance in hallway  Level of Assistance Standby assist, set-up cues, supervision of patient - no hands on  Assistive Device Front wheel walker  Distance Ambulated (ft) 120 ft  Activity Response Tolerated well  Mobility Referral Yes  $Mobility charge 1 Mobility  Mobility Specialist Start Time (ACUTE ONLY) 0841  Mobility Specialist Stop Time (ACUTE ONLY) 0900  Mobility Specialist Time Calculation (min) (ACUTE ONLY) 19 min   Pt received in bed and agreeable to mobility. Pt was minA from supine to sitting. No complaints during session. Pt to bed after session with all needs met.     [facility]

## 2023-02-07 DIAGNOSIS — K56609 Unspecified intestinal obstruction, unspecified as to partial versus complete obstruction: Secondary | ICD-10-CM | POA: Diagnosis not present

## 2023-02-07 DIAGNOSIS — K219 Gastro-esophageal reflux disease without esophagitis: Secondary | ICD-10-CM | POA: Diagnosis not present

## 2023-02-07 DIAGNOSIS — E785 Hyperlipidemia, unspecified: Secondary | ICD-10-CM | POA: Diagnosis not present

## 2023-02-07 DIAGNOSIS — I10 Essential (primary) hypertension: Secondary | ICD-10-CM | POA: Diagnosis not present

## 2023-02-07 LAB — BASIC METABOLIC PANEL
Anion gap: 7 (ref 5–15)
BUN: 12 mg/dL (ref 8–23)
CO2: 26 mmol/L (ref 22–32)
Calcium: 9.2 mg/dL (ref 8.9–10.3)
Chloride: 103 mmol/L (ref 98–111)
Creatinine, Ser: 0.66 mg/dL (ref 0.44–1.00)
GFR, Estimated: >60 mL/min (ref >60)
Glucose, Bld: 270 mg/dL — ABNORMAL HIGH (ref 70–99)
Potassium: 3.3 mmol/L — ABNORMAL LOW (ref 3.5–5.1)
Sodium: 136 mmol/L (ref 135–145)

## 2023-02-07 LAB — CBC
HCT: 36.9 % (ref 36.0–46.0)
Hemoglobin: 11.1 g/dL — ABNORMAL LOW (ref 12.0–15.0)
MCH: 30.5 pg (ref 26.0–34.0)
MCHC: 30.1 g/dL (ref 30.0–36.0)
MCV: 101.4 fL — ABNORMAL HIGH (ref 80.0–100.0)
Platelets: 268 10*3/uL (ref 150–400)
RBC: 3.64 MIL/uL — ABNORMAL LOW (ref 3.87–5.11)
RDW: 13.9 % (ref 11.5–15.5)
WBC: 7.4 10*3/uL (ref 4.0–10.5)
nRBC: 0 % (ref 0.0–0.2)

## 2023-02-07 LAB — CULTURE, BLOOD (ROUTINE X 2)
Special Requests: ADEQUATE
Special Requests: ADEQUATE

## 2023-02-07 LAB — SURGICAL PATHOLOGY

## 2023-02-07 LAB — CULTURE, RESPIRATORY W GRAM STAIN: Culture: NORMAL

## 2023-02-07 LAB — PROCALCITONIN: Procalcitonin: 0.35 ng/mL

## 2023-02-07 MED ORDER — SODIUM CHLORIDE 0.9 % IV SOLN
3.0000 g | Freq: Four times a day (QID) | INTRAVENOUS | Status: DC
  Administered 2023-02-07 – 2023-02-14 (×28): 3 g via INTRAVENOUS
  Filled 2023-02-07 (×30): qty 8

## 2023-02-07 MED ORDER — KETOROLAC TROMETHAMINE 15 MG/ML IJ SOLN
15.0000 mg | Freq: Four times a day (QID) | INTRAMUSCULAR | Status: AC
  Administered 2023-02-07 – 2023-02-12 (×20): 15 mg via INTRAVENOUS
  Filled 2023-02-07 (×20): qty 1

## 2023-02-07 MED ORDER — AZITHROMYCIN 250 MG PO TABS
500.0000 mg | ORAL_TABLET | Freq: Every day | ORAL | Status: AC
  Administered 2023-02-07: 500 mg via ORAL
  Filled 2023-02-07: qty 2

## 2023-02-07 MED ORDER — POTASSIUM CHLORIDE 10 MEQ/100ML IV SOLN
10.0000 meq | INTRAVENOUS | Status: AC
  Administered 2023-02-07 (×4): 10 meq via INTRAVENOUS
  Filled 2023-02-07: qty 100

## 2023-02-07 MED ORDER — ACETAMINOPHEN 10 MG/ML IV SOLN
1000.0000 mg | Freq: Four times a day (QID) | INTRAVENOUS | Status: AC
  Administered 2023-02-07 – 2023-02-08 (×4): 1000 mg via INTRAVENOUS
  Filled 2023-02-07 (×3): qty 100

## 2023-02-07 MED ORDER — AZITHROMYCIN 250 MG PO TABS
250.0000 mg | ORAL_TABLET | Freq: Every day | ORAL | Status: AC
  Administered 2023-02-08 – 2023-02-11 (×4): 250 mg via ORAL
  Filled 2023-02-07 (×4): qty 1

## 2023-02-07 MED ORDER — MAGIC MOUTHWASH
2.0000 mL | Freq: Four times a day (QID) | ORAL | Status: DC
  Administered 2023-02-07 – 2023-02-11 (×15): 2 mL via ORAL
  Filled 2023-02-07 (×21): qty 5

## 2023-02-07 MED ORDER — LIDOCAINE 5 % EX PTCH
2.0000 | MEDICATED_PATCH | CUTANEOUS | Status: DC
  Administered 2023-02-07 – 2023-02-15 (×9): 2 via TRANSDERMAL
  Filled 2023-02-07 (×9): qty 2

## 2023-02-07 NOTE — Progress Notes (Signed)
Mobility Specialist - Progress Note   02/07/23 0855  Mobility  Activity Ambulated with assistance in hallway  Level of Assistance Modified independent, requires aide device or extra time  Assistive Device Other (Comment) (IV Pole)  Distance Ambulated (ft) 250 ft  Activity Response Tolerated well  Mobility Referral Yes  $Mobility charge 1 Mobility  Mobility Specialist Start Time (ACUTE ONLY) [id number]  Mobility Specialist Stop Time (ACUTE ONLY) 0855  Mobility Specialist Time Calculation (min) (ACUTE ONLY) 20 min   Pt received in bed and agreeable to mobility. Pt c/o abdomen pain during session but still eager to ambulate. No other complaints during session. Pt to bed after session with all needs met.   [facility]

## 2023-02-07 NOTE — Progress Notes (Signed)
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 Seen in the ED underwent imaging study with CT scan showing signs of small bowel obstruction, general surgery was consulted and admitted with NG tube decompression IV fluid hydration.  Labs has been unremarkable with stable renal function CBC. 9/23> status post exploratory laparotomy, found to have very large small bowel mass, not obstructing, sent for biopsy no large palpable lymph nodes seen in the mesentery.  9/25: Patient developed fever today, having some cough.  NG tube remained in place, no BM or passing flatus.  Ordered chest x-ray and blood cultures.  Holding antibiotics for now. Surgery might repeat CT abdomen.  9/26: Recorded fever of 101.2 over the past 24-hour, worsening cough and chest x-ray with concern of right lower lobe infiltrate.  Preliminary blood cultures negative in 24-hour, starting on Unasyn and Zithromax.  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    Subjective: Patient continued to have cough, started passing some flatus and had a very small bowel movement.   Assessment and Plan: Principal Problem:   SBO (small bowel obstruction) (HCC) Active Problems:   Dyslipidemia, goal LDL below 130   Essential hypertension, benign   Gastroesophageal reflux disease without esophagitis   Stage 3a chronic kidney disease (HCC)   Small bowel obstruction Small bowel mass: 9/23> status post exploratory laparotomy, found to have very large small bowel mass, not obstructing, sent  for biopsy no large palpable lymph nodes seen in the mesentery.  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 -Continue with supportive care  Fever.  Can be due to recent surgery.  Having mild cough.  No leukocytosis.  Procalcitonin elevated at 0.35, chest x-ray concerning for a new right lower lobe infiltrate. -Starting on Unasyn and Zithromax -Continue to monitor  GERD: Stable on PPI  Essential hypertension: BP remains controlled on prn HLD: Statin on hold as n.p.o. CKD stage II: Based on GFR patient has CKD stage II, renal function remains stable and at baseline, monitor.  Obesity:Patient's Body mass index is 34.63 kg/m. : Will benefit with PCP follow-up, weight loss and healthy lifestyle  DVT prophylaxis: enoxaparin (LOVENOX) injection 40 mg Start: 02/06/23 1200 Place and maintain sequential compression device Start: 02/04/23 1050scd added-start chemical prophylaxis once okay with surgery post op Code Status:   Code Status: Full Code Family Communication: Discussed with 2 family friends at bedside Patient status is: Inpatient because of SBO Level of care: Med-Surg   Dispo: The patient is from: Home            Anticipated disposition: tbd  Objective: Vitals last 24 hrs: Vitals:   02/07/23 0025 02/07/23 0409 02/07/23 0925 02/07/23 1256  BP: (!) 149/76 (!) 149/73 (!) 158/77 (!) 148/79  Pulse: (!) 102 (!) 102 95 (!) 102  Resp: 16 17 14 14   Temp: 99.6 F (37.6 C) 99.6 F (37.6 C) (!) 100.9 F (38.3 C) 98.7 F (37.1 C)  TempSrc: Oral Oral Oral Oral  SpO2: 99% 92% 93% 92%  Weight:  Height:       Weight change:   Physical Examination: General.  Frail lady, in no acute distress.  Clamped NG tube. Pulmonary.  Lungs clear bilaterally, normal respiratory effort. CV.  Regular rate and rhythm, no JVD,  rub or murmur. Abdomen.  Soft, nontender, nondistended, BS positive.  Mildly decreased breath sounds on left lower quadrant CNS.  Alert and oriented .  No focal neurologic deficit. Extremities.  No edema, no cyanosis, pulses intact and symmetrical. Psychiatry.  Judgment and insight appears normal. .   Medications reviewed:  Scheduled Meds:  [START ON 02/08/2023] azithromycin  250 mg Oral Daily   Chlorhexidine Gluconate Cloth  6 each Topical Daily   enoxaparin (LOVENOX) injection  40 mg Subcutaneous Q24H   ketorolac  15 mg Intravenous Q6H   lidocaine  2 patch Transdermal Q24H   magic mouthwash  2 mL Oral QID   Continuous Infusions:  acetaminophen 1,000 mg (02/07/23 1154)   ampicillin-sulbactam (UNASYN) IV 3 g (02/07/23 1227)   dextrose 5% lactated ringers 100 mL/hr at 02/07/23 0019   methocarbamol (ROBAXIN) IV 1,000 mg (02/07/23 1104)    Diet Order             Diet NPO time specified  Diet effective now                  Intake/Output Summary (Last 24 hours) at 02/07/2023 1502 Last data filed at 02/07/2023 1300 Gross per 24 hour  Intake 2053.76 ml  Output 1800 ml  Net 253.76 ml   Net IO Since Admission: 3,855.54 mL [02/07/23 1502]  Wt Readings from Last 3 Encounters:  02/04/23 91.5 kg  10/17/22 91.6 kg  04/18/22 95.7 kg     Unresulted Labs (From admission, onward)     Start     Ordered   02/05/23 0500  Basic metabolic panel  Daily,   R      02/04/23 0841   02/05/23 0500  CBC  Daily,   R      02/04/23 0841          Data Reviewed: I have personally reviewed following labs and imaging studies CBC: Recent Labs  Lab 02/03/23 1516 02/04/23 0416 02/05/23 0438 02/06/23 0430 02/07/23 0604  WBC 5.4 3.8* 5.9 6.8 7.4  NEUTROABS 3.3  --   --   --   --   HGB 13.9 12.3 11.7* 11.2* 11.1*  HCT 43.8 39.3 37.6 35.9* 36.9  MCV 96.5 98.7 98.7 97.8 101.4*  PLT 350 325 278 279 268   Basic Metabolic Panel: Recent Labs  Lab 02/03/23 1516 02/04/23 0416 02/05/23 0438  02/06/23 0430 02/07/23 0604  NA 139 139 142 139 136  K 4.0 3.9 3.9 3.4* 3.3*  CL 105 107 106 105 103  CO2 25 24 26 27 26   GLUCOSE 99 135* 143* 263* 270*  BUN 23 18 16 14 12   CREATININE 1.17* 0.84 0.96 0.84 0.66  CALCIUM 10.0 9.8 9.8 9.1 9.2   GFR: Estimated Creatinine Clearance: 72.7 mL/min (by C-G formula based on SCr of 0.66 mg/dL). Liver Function Tests: Recent Labs  Lab 02/03/23 1516 02/04/23 0416  AST 18 14*  ALT 14 12  ALKPHOS 90 75  BILITOT 0.9 1.0  PROT 7.7 7.1  ALBUMIN 3.7 3.3*   Recent Labs  Lab 02/03/23 1516  LIPASE 29  Antimicrobials: Anti-infectives (From admission, onward)    Start     Dose/Rate Route Frequency Ordered Stop   02/08/23 1000  azithromycin (ZITHROMAX) tablet  250 mg       Placed in "Followed by" Linked Group   250 mg Oral Daily 02/07/23 0912 02/12/23 0959   02/07/23 1200  Ampicillin-Sulbactam (UNASYN) 3 g in sodium chloride 0.9 % 100 mL IVPB        3 g 200 mL/hr over 30 Minutes Intravenous Every 6 hours 02/07/23 0946     02/07/23 1000  azithromycin (ZITHROMAX) tablet 500 mg       Placed in "Followed by" Linked Group   500 mg Oral Daily 02/07/23 0912 02/07/23 0932   02/04/23 0930  cefOXitin (MEFOXIN) 2 g in sodium chloride 0.9 % 100 mL IVPB        2 g 200 mL/hr over 30 Minutes Intravenous  Once 02/04/23 0844 02/04/23 1011      Culture/Microbiology    Component Value Date/Time   SDES EXPECTORATED SPUTUM 02/06/2023 1645   SDES  02/06/2023 1645    EXPECTORATED SPUTUM Performed at [facility], [address], [place], [place] [address]    SPECREQUEST NONE 02/06/2023 1645   SPECREQUEST  02/06/2023 1645    NONE Reflexed from [id number] Performed at [facility], [address] [place], [place] [address]    CULT  02/06/2023 1645    CULTURE REINCUBATED FOR BETTER GROWTH Performed at [facility], [address] [place], [place] [address]    REPTSTATUS 02/06/2023 FINAL 02/06/2023 1645    REPTSTATUS PENDING 02/06/2023 1645    Radiology Studies: DG CHEST PORT 1 VIEW  Result Date: 02/06/2023 CLINICAL DATA:  Fever abdominal pain EXAM: PORTABLE CHEST 1 VIEW COMPARISON:  03/31/2020 FINDINGS: Esophageal tube tip below the diaphragm and curled at the fundus of stomach. Low lung volumes with atelectasis or pneumonia at the right base. Mild cardiomegaly with aortic atherosclerosis. IMPRESSION: Low lung volumes with atelectasis or pneumonia at the right base. Electronically Signed   By: Jasmine Pang M.D.   On: 02/06/2023 22:01   DG Abd Portable 1V  Result Date: 02/06/2023 CLINICAL DATA:  Readjustment of NG tube. EXAM: PORTABLE ABDOMEN - 1 VIEW COMPARISON:  Earlier same day FINDINGS: Interval advancement of enteric tube with tip and side port now projecting over the expected location of the gastric fundus. Otherwise, stable examination. IMPRESSION: 1. Interval advancement of enteric tube with tip and side port now projecting over the expected location of the gastric fundus. 2. Otherwise, stable examination, as described on abdominal radiograph performed earlier same day. Electronically Signed   By: Simonne Come M.D.   On: 02/06/2023 11:50   DG Abd Portable 1V  Result Date: 02/06/2023 CLINICAL DATA:  Confirm NG tube placement. Postoperative nausea and vomiting. EXAM: PORTABLE ABDOMEN - 1 VIEW COMPARISON:  Earlier same day; 02/04/2023 FINDINGS: Interval retraction enteric tube with tip and side port projected over the distal esophagus. Advancement at least 17 cm is advised. Redemonstrated moderate-to-marked gaseous distention of multiple loops of small bowel with index loop of small bowel within the right mid abdomen measuring 5.3 cm in diameter. Midline abdominal staples. Limited visualization of the lower thorax demonstrates trace right-sided pleural effusion with bibasilar heterogeneous/consolidative opacities, right-greater-than-left. No acute osseous abnormalities. Mild scoliotic curvature of  the thoracolumbar spine with associated multilevel DDD, incompletely evaluated. IMPRESSION: 1. Interval retraction enteric tube with tip and side port projected over the distal esophagus. Advancement at least 17 cm is advised. 2. Redemonstrated moderate-to-marked gaseous distention of multiple loops of small bowel, nonspecific though worrisome for small bowel obstruction. 3. Trace right-sided pleural  effusion with bibasilar heterogeneous/consolidative opacities, right-greater-than-left, atelectasis versus infiltrate. Electronically Signed   By: Simonne Come M.D.   On: 02/06/2023 11:49    LOS: 4 days  Arnetha Courser, MD [facility]  02/07/2023, 3:02 PM

## 2023-02-07 NOTE — Progress Notes (Signed)
Pharmacy Antibiotic Note  Kayla Lowe is a 67 y.o. female admitted on 02/03/2023 with aspiration pneumonia.  Pharmacy has been consulted for Unasyn dosing.  Plan: Unasyn 3 gr IV q6h   Height: 5\' 4"  (162.6 cm) Weight: 91.5 kg (201 lb 11.5 oz) IBW/kg (Calculated) : 54.7  Temp (24hrs), Avg:100.3 F (37.9 C), Min:99.6 F (37.6 C), Max:101.2 F (38.4 C)  Recent Labs  Lab 02/03/23 1516 02/04/23 0416 02/05/23 0438 02/06/23 0430 02/07/23 0604  WBC 5.4 3.8* 5.9 6.8 7.4  CREATININE 1.17* 0.84 0.96 0.84 0.66    Estimated Creatinine Clearance: 72.7 mL/min (by C-G formula based on SCr of 0.66 mg/dL).    Allergies  Allergen Reactions   Cozaar [Losartan Potassium]     Caused cough and sweats. Tingling in legs.   Lisinopril Cough   Statins Other (See Comments)    myalgias      Dosage will likely remain stable at above dosage and need for further dosage adjustment appears unlikely at present.    Will sign off at this time.  Please reconsult if a change in clinical status warrants re-evaluation of dosage.    Thank you for allowing pharmacy to be a part of this patient's care.   Adalberto Cole, PharmD, BCPS 02/07/2023 9:46 AM

## 2023-02-07 NOTE — Plan of Care (Signed)
Problem: Education: Goal: Knowledge of General Education information will improve Description: Including pain rating scale, medication(s)/side effects and non-pharmacologic comfort measures Outcome: Progressing   Problem: Activity: Goal: Risk for activity intolerance will decrease Outcome: Progressing   Problem: Coping: Goal: Level of anxiety will decrease Outcome: Progressing

## 2023-02-07 NOTE — Progress Notes (Signed)
Patient ID: Kayla Lowe, female   DOB: Nov 12, 1955, 67 y.o.   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  I explained the pathology to the patient and friend in the room and her daughter by phone.  She will need to be referred to medically oncology as an outpt once discharged for their opinion

## 2023-02-07 NOTE — Progress Notes (Signed)
[facility] Progress Note  3 Days Post-Op  Subjective: CC:  Reports flatus around 0400. She says she had a very tiny BM. Reports 750 cc on IS.  Resp cultures from yesterday growing gram postiive cocci NG with 1350 mL/24h  Her cc is NG tube, she wants it out   Objective: Vital signs in last 24 hours: Temp:  [99.6 F (37.6 C)-101.2 F (38.4 C)] 100.9 F (38.3 C) (09/26 0925) Pulse Rate:  [95-105] 95 (09/26 0925) Resp:  [14-20] 14 (09/26 0925) BP: (129-158)/(65-77) 158/77 (09/26 0925) SpO2:  [70 %-99 %] 93 % (09/26 0925) Last BM Date : 02/03/23  Intake/Output from previous day: 09/25 0701 - 09/26 0700 In: 2052.5 [P.O.:30; I.V.:1692.4; IV Piggyback:330.1] Out: 2150 [Urine:800; Emesis/NG output:1350] Intake/Output this shift: No intake/output data recorded.  PE: Gen:  Alert, NAD, cooperative  Card:  Regular rate Pulm:  Normal effort Abd: Soft, mild distention, NGT with bilious effluent, honeycomb in tact Skin: warm and dry, no rashes  Psych: A&Ox3   Lab Results:  Recent Labs    02/06/23 0430 02/07/23 0604  WBC 6.8 7.4  HGB 11.2* 11.1*  HCT 35.9* 36.9  PLT 279 268   BMET Recent Labs    02/06/23 0430 02/07/23 0604  NA 139 136  K 3.4* 3.3*  CL 105 103  CO2 27 26  GLUCOSE 263* 270*  BUN 14 12  CREATININE 0.84 0.66  CALCIUM 9.1 9.2   PT/INR No results for input(s): "LABPROT", "INR" in the last 72 hours. CMP     Component Value Date/Time   NA 136 02/07/2023 0604   NA 145 (H) 07/29/2018 1112   K 3.3 (L) 02/07/2023 0604   CL 103 02/07/2023 0604   CO2 26 02/07/2023 0604   GLUCOSE 270 (H) 02/07/2023 0604   BUN 12 02/07/2023 0604   BUN 8 07/29/2018 1112   CREATININE 0.66 02/07/2023 0604   CREATININE 0.87 08/03/2015 0942   CALCIUM 9.2 02/07/2023 0604   PROT 7.1 02/04/2023 0416   PROT 7.0 07/29/2018 1112   ALBUMIN 3.3 (L) 02/04/2023 0416   ALBUMIN 4.2 07/29/2018 1112   AST 14 (L) 02/04/2023 0416   ALT 12 02/04/2023 0416   ALKPHOS 75  02/04/2023 0416   BILITOT 1.0 02/04/2023 0416   BILITOT 0.5 07/29/2018 1112   GFRNONAA >60 02/07/2023 0604   GFRNONAA 67 06/18/2014 1714   GFRAA 75 07/29/2018 1112   GFRAA 77 06/18/2014 1714   Lipase     Component Value Date/Time   LIPASE 29 02/03/2023 1516       Studies/Results: DG CHEST PORT 1 VIEW  Result Date: 02/06/2023 CLINICAL DATA:  Fever abdominal pain EXAM: PORTABLE CHEST 1 VIEW COMPARISON:  03/31/2020 FINDINGS: Esophageal tube tip below the diaphragm and curled at the fundus of stomach. Low lung volumes with atelectasis or pneumonia at the right base. Mild cardiomegaly with aortic atherosclerosis. IMPRESSION: Low lung volumes with atelectasis or pneumonia at the right base. Electronically Signed   By: Jasmine Pang M.D.   On: 02/06/2023 22:01   DG Abd Portable 1V  Result Date: 02/06/2023 CLINICAL DATA:  Readjustment of NG tube. EXAM: PORTABLE ABDOMEN - 1 VIEW COMPARISON:  Earlier same day FINDINGS: Interval advancement of enteric tube with tip and side port now projecting over the expected location of the gastric fundus. Otherwise, stable examination. IMPRESSION: 1. Interval advancement of enteric tube with tip and side port now projecting over the expected location of the gastric fundus. 2. Otherwise, stable examination, as  described on abdominal radiograph performed earlier same day. Electronically Signed   By: Simonne Come M.D.   On: 02/06/2023 11:50   DG Abd Portable 1V  Result Date: 02/06/2023 CLINICAL DATA:  Confirm NG tube placement. Postoperative nausea and vomiting. EXAM: PORTABLE ABDOMEN - 1 VIEW COMPARISON:  Earlier same day; 02/04/2023 FINDINGS: Interval retraction enteric tube with tip and side port projected over the distal esophagus. Advancement at least 17 cm is advised. Redemonstrated moderate-to-marked gaseous distention of multiple loops of small bowel with index loop of small bowel within the right mid abdomen measuring 5.3 cm in diameter. Midline abdominal  staples. Limited visualization of the lower thorax demonstrates trace right-sided pleural effusion with bibasilar heterogeneous/consolidative opacities, right-greater-than-left. No acute osseous abnormalities. Mild scoliotic curvature of the thoracolumbar spine with associated multilevel DDD, incompletely evaluated. IMPRESSION: 1. Interval retraction enteric tube with tip and side port projected over the distal esophagus. Advancement at least 17 cm is advised. 2. Redemonstrated moderate-to-marked gaseous distention of multiple loops of small bowel, nonspecific though worrisome for small bowel obstruction. 3. Trace right-sided pleural effusion with bibasilar heterogeneous/consolidative opacities, right-greater-than-left, atelectasis versus infiltrate. Electronically Signed   By: Simonne Come M.D.   On: 02/06/2023 11:49    Anti-infectives: Anti-infectives (From admission, onward)    Start     Dose/Rate Route Frequency Ordered Stop   02/08/23 1000  azithromycin (ZITHROMAX) tablet 250 mg       Placed in "Followed by" Linked Group   250 mg Oral Daily 02/07/23 0912 02/12/23 0959   02/07/23 1200  Ampicillin-Sulbactam (UNASYN) 3 g in sodium chloride 0.9 % 100 mL IVPB        3 g 200 mL/hr over 30 Minutes Intravenous Every 6 hours 02/07/23 0946     02/07/23 1000  azithromycin (ZITHROMAX) tablet 500 mg       Placed in "Followed by" Linked Group   500 mg Oral Daily 02/07/23 0912 02/07/23 0932   02/04/23 0930  cefOXitin (MEFOXIN) 2 g in sodium chloride 0.9 % 100 mL IVPB        2 g 200 mL/hr over 30 Minutes Intravenous  Once 02/04/23 0844 02/04/23 1011        Assessment/Plan  SBO  S/p exploratory laparotomy, small bowel resection 9/23 DB POD#3 AFVSS  Await surgical path  - NG output increased with proper placement of tube. She started having some bowel function today and is eager for NG removal. Ok for NG clamp trial today - low threshold to leave NG tube in place overnight but will remove this  afternoon if clinically appropriate.  - IV tylenol re-ordered, add lidoderm patch, add toradol, continue robaxin, and PRN morphine - start abx for HCAP per primary team  Replete K (3.3 with 40 mEq IV KCl) Foley removed and pt voiding independently.  daily lovenox for DVT ppx   LOS: 4 days   I reviewed nursing notes, hospitalist notes, last 24 h vitals and pain scores, last 48 h intake and output, last 24 h labs and trends, and last 24 h imaging results.  Hosie Spangle, PA-C [facility] Please see Amion for pager number during day hours 7:00am-4:30pm

## 2023-02-07 NOTE — Discharge Instructions (Addendum)
MIDLINE WOUND CARE: Mepitel should be placed in the wound bed. Mepitel should be changed every 3 days. Moist-to-dry dressing should be changed twice daily on top of the mepitel. - midline dressing to be changed twice daily - supplies: MEPTIEL. saline, kerlix/rolled or 4x4 gauze, scissors, ABD pads, tape  - place MEPITEL, trimmed to size, in the wound bed. This should be changed every 3 days. - dampen a clean kerlix with saline and pack wound from wound base to skin level. Wound can be packed loosely. Trim kerlix to size if a whole kerlix is not required. - cover wound with a dry ABD pad and secure with tape.  - change dressing as needed if leakage occurs, wound gets contaminated, or patient requests to shower.  -  ABDOMINAL BINDER SHOULD BE WORN AT ALL TIMES. MAY BE REMOVED WHEN PATIENT LAYING FLAT IN BED. OTHERWISE SHOULD BE WORN AT ALL TIMES- FOR SITTING UP, FOR WALKING, ETC.      [facility]      [facility], [place] [phone]  OPEN ABDOMINAL SURGERY: POST OP INSTRUCTIONS  Always review your discharge instruction sheet given to you by the facility where your surgery was performed.  IF YOU HAVE DISABILITY OR FAMILY LEAVE FORMS, YOU MUST BRING THEM TO THE OFFICE FOR PROCESSING.  PLEASE DO NOT GIVE THEM TO YOUR DOCTOR.  A prescription for pain medication may be given to you upon discharge.  Take your pain medication as prescribed, if needed.  If narcotic pain medicine is not needed, then you may take acetaminophen (Tylenol) or ibuprofen (Advil) as needed. Take your usually prescribed medications unless otherwise directed. If you need a refill on your pain medication, please contact your pharmacy. They will contact our office to request authorization.  Prescriptions will not be filled after 5pm or on week-ends. You should follow a light diet the first few days after arrival home, such as soup and crackers, pudding, etc.unless your doctor has advised otherwise. A high-fiber, low fat diet can  be resumed as tolerated.   Be sure to include lots of fluids daily. Most patients will experience some swelling and bruising on the chest and neck area.  Ice packs will help.  Swelling and bruising can take several days to resolve Most patients will experience some swelling and bruising in the area of the incision. Ice pack will help. Swelling and bruising can take several days to resolve..  It is common to experience some constipation if taking pain medication after surgery.  Increasing fluid intake and taking a stool softener will usually help or prevent this problem from occurring.  A mild laxative (Milk of Magnesia or Miralax) should be taken according to package directions if there are no bowel movements after 48 hours.  You may have steri-strips (small skin tapes) in place directly over the incision.  These strips should be left on the skin for 7-10 days.  If your surgeon used skin glue on the incision, you may shower in 24 hours.  The glue will flake off over the next 2-3 weeks.  Any sutures or staples will be removed at the office during your follow-up visit. You may find that a light gauze bandage over your incision may keep your staples from being rubbed or pulled. You may shower and replace the bandage daily. ACTIVITIES:  You may resume regular (light) daily activities beginning the next day--such as daily self-care, walking, climbing stairs--gradually increasing activities as tolerated.  You may have sexual intercourse when it is comfortable.  Refrain from any heavy lifting or straining until approved by your doctor. You may drive when you no longer are taking prescription pain medication, you can comfortably wear a seatbelt, and you can safely maneuver your car and apply brakes  You should see your doctor in the office for a follow-up appointment approximately two weeks after your surgery.  Make sure that you call for this appointment within a day or two after you arrive home to insure a  convenient appointment time.   WHEN TO CALL YOUR DOCTOR: Fever over 101.0 Inability to urinate Nausea and/or vomiting Extreme swelling or bruising Continued bleeding from incision. Increased pain, redness, or drainage from the incision. Difficulty swallowing or breathing Muscle cramping or spasms. Numbness or tingling in hands or feet or around lips.  The clinic staff is available to answer your questions during regular business hours.  Please don't hesitate to call and ask to speak to one of the nurses if you have concerns.  For further questions, please visit [email or web address]

## 2023-02-08 DIAGNOSIS — I10 Essential (primary) hypertension: Secondary | ICD-10-CM | POA: Diagnosis not present

## 2023-02-08 DIAGNOSIS — E785 Hyperlipidemia, unspecified: Secondary | ICD-10-CM | POA: Diagnosis not present

## 2023-02-08 DIAGNOSIS — K219 Gastro-esophageal reflux disease without esophagitis: Secondary | ICD-10-CM | POA: Diagnosis not present

## 2023-02-08 DIAGNOSIS — K56609 Unspecified intestinal obstruction, unspecified as to partial versus complete obstruction: Secondary | ICD-10-CM | POA: Diagnosis not present

## 2023-02-08 LAB — CBC
HCT: 34.3 % — ABNORMAL LOW (ref 36.0–46.0)
Hemoglobin: 10.7 g/dL — ABNORMAL LOW (ref 12.0–15.0)
MCH: 30.7 pg (ref 26.0–34.0)
MCHC: 31.2 g/dL (ref 30.0–36.0)
MCV: 98.3 fL (ref 80.0–100.0)
Platelets: 275 10*3/uL (ref 150–400)
RBC: 3.49 MIL/uL — ABNORMAL LOW (ref 3.87–5.11)
RDW: 13.8 % (ref 11.5–15.5)
WBC: 8 10*3/uL (ref 4.0–10.5)
nRBC: 0 % (ref 0.0–0.2)

## 2023-02-08 LAB — BASIC METABOLIC PANEL
Anion gap: 9 (ref 5–15)
BUN: 12 mg/dL (ref 8–23)
CO2: 26 mmol/L (ref 22–32)
Calcium: 9.6 mg/dL (ref 8.9–10.3)
Chloride: 108 mmol/L (ref 98–111)
Creatinine, Ser: 0.6 mg/dL (ref 0.44–1.00)
GFR, Estimated: >60 mL/min (ref >60)
Glucose, Bld: 118 mg/dL — ABNORMAL HIGH (ref 70–99)
Potassium: 3.2 mmol/L — ABNORMAL LOW (ref 3.5–5.1)
Sodium: 143 mmol/L (ref 135–145)

## 2023-02-08 LAB — MAGNESIUM: Magnesium: 2.3 mg/dL (ref 1.7–2.4)

## 2023-02-08 MED ORDER — POTASSIUM CHLORIDE 20 MEQ PO PACK
40.0000 meq | PACK | Freq: Once | ORAL | Status: AC
  Administered 2023-02-08: 40 meq via ORAL
  Filled 2023-02-08: qty 2

## 2023-02-08 MED ORDER — HYDRALAZINE HCL 20 MG/ML IJ SOLN
10.0000 mg | Freq: Once | INTRAMUSCULAR | Status: AC
  Administered 2023-02-08: 10 mg via INTRAVENOUS
  Filled 2023-02-08: qty 1

## 2023-02-08 MED ORDER — PANTOPRAZOLE SODIUM 20 MG PO TBEC
20.0000 mg | DELAYED_RELEASE_TABLET | Freq: Once | ORAL | Status: AC
  Administered 2023-02-08: 20 mg via ORAL
  Filled 2023-02-08: qty 1

## 2023-02-08 MED ORDER — HYDRALAZINE HCL 20 MG/ML IJ SOLN
5.0000 mg | Freq: Four times a day (QID) | INTRAMUSCULAR | Status: DC | PRN
  Administered 2023-02-08 – 2023-02-10 (×4): 5 mg via INTRAVENOUS
  Filled 2023-02-08 (×4): qty 1

## 2023-02-08 MED ORDER — ACETAMINOPHEN 500 MG PO TABS
1000.0000 mg | ORAL_TABLET | Freq: Three times a day (TID) | ORAL | Status: DC
  Administered 2023-02-08 – 2023-02-12 (×11): 1000 mg via ORAL
  Filled 2023-02-08 (×12): qty 2

## 2023-02-08 MED ORDER — POTASSIUM CHLORIDE 20 MEQ PO PACK: 40.0000 meq | PACK | Freq: Once | ORAL | Status: DC

## 2023-02-08 MED ORDER — OXYCODONE HCL 5 MG PO TABS
5.0000 mg | ORAL_TABLET | ORAL | Status: DC | PRN
  Administered 2023-02-08: 5 mg via ORAL
  Filled 2023-02-08: qty 1

## 2023-02-08 NOTE — Progress Notes (Signed)
PROGRESS NOTE Kayla Lowe  ZOX:[id number] DOB: [birth year] DOA: 02/03/2023 PCP: Etta Grandchild, MD  Brief Narrative/Hospital Course: 67 year old female with GAD, HTN HLD osteoarthritis history of total abdominal hysterectomy 987 pagetoid nausea right and left upper quadrant pain along with diarrhea of 2 days without bleeding per rectum. Seen in the ED underwent imaging study with CT scan showing signs of small bowel obstruction, general surgery was consulted and admitted with NG tube decompression IV fluid hydration.  Labs has been unremarkable with stable renal function CBC. 9/23> status post exploratory laparotomy, found to have very large small bowel mass, not obstructing, sent for biopsy no large palpable lymph nodes seen in the mesentery.  9/25: Patient developed fever today, having some cough.  NG tube remained in place, no BM or passing flatus.  Ordered chest x-ray and blood cultures.  Holding antibiotics for now. Surgery might repeat CT abdomen.  9/26: Recorded fever of 101.2 over the past 24-hour, worsening cough and chest x-ray with concern of right lower lobe infiltrate.  Preliminary blood cultures negative in 24-hour, starting on Unasyn and Zithromax.  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  9/27: Recorded temperature of 100.9 over the past 24-hour, blood cultures remain negative, respiratory cultures pending, labs overall stable with mild hypokalemia which is being repleted. NG tube was removed and started on diet.  Had a bowel movement.    Subjective: Patient with some improving cough but continued to have some upper respiratory symptoms.  NG tube was removed.  Passing flatus and had a large bowel movement.   Assessment and Plan: Principal Problem:   SBO (small bowel obstruction) (HCC) Active Problems:    Dyslipidemia, goal LDL below 130   Essential hypertension, benign   Gastroesophageal reflux disease without esophagitis   Stage 3a chronic kidney disease (HCC)   Small bowel obstruction Small bowel mass: 9/23> status post exploratory laparotomy, found to have very large small bowel mass, not obstructing, sent for biopsy no large palpable lymph nodes seen in the mesentery.  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 -Continue with supportive care -Improving and started on diet, having bowel movement.  Fever.  Can be due to recent surgery.  Having mild cough.  No leukocytosis.  Procalcitonin elevated at 0.35, chest x-ray concerning for a new right lower lobe infiltrate. -Starting on Unasyn and Zithromax -Continue to monitor  GERD: Stable on PPI  Essential hypertension: BP remains controlled on prn HLD: Statin on hold as n.p.o. CKD stage II: Based on GFR patient has CKD stage II, renal function remains stable and at baseline, monitor.  Obesity:Patient's Body mass index is 34.63 kg/m. : Will benefit with PCP follow-up, weight loss and healthy lifestyle  DVT prophylaxis: enoxaparin (LOVENOX) injection 40 mg Start: 02/06/23 1200 Place and maintain sequential compression device Start: 02/04/23 1050scd added-start chemical prophylaxis once okay with surgery post op Code Status:   Code Status: Full Code Family Communication: Discussed with  family friends at bedside Patient status is: Inpatient because of SBO Level of care: Med-Surg   Dispo: The patient is from: Home            Anticipated disposition: tbd  Objective: Vitals last 24 hrs: Vitals:   02/08/23 0525 02/08/23 0609 02/08/23 0818 02/08/23 1335  BP: (!) 167/81 (!) 160/96 (!) 161/79 (!) 161/89  Pulse: 88 86 89 90  Resp: 18 18 19 18   Temp: 99 F (37.2  C) 99 F (37.2 C) 98.5 F (36.9 C) 98.7 F (37.1 C)  TempSrc: Oral Oral    SpO2: 90% 92% 100% 94%  Weight:      Height:       Weight change:   Physical Examination: General.  Well-developed lady, in no acute distress. Pulmonary.  Lungs clear bilaterally, normal respiratory effort. CV.  Regular rate and rhythm, no JVD, rub or murmur. Abdomen.  Soft, nontender, nondistended, BS positive. CNS.  Alert and oriented .  No focal neurologic deficit. Extremities.  No edema, no cyanosis, pulses intact and symmetrical. Psychiatry.  Judgment and insight appears normal.    Medications reviewed:  Scheduled Meds:  acetaminophen  1,000 mg Oral TID   azithromycin  250 mg Oral Daily   Chlorhexidine Gluconate Cloth  6 each Topical Daily   enoxaparin (LOVENOX) injection  40 mg Subcutaneous Q24H   ketorolac  15 mg Intravenous Q6H   lidocaine  2 patch Transdermal Q24H   magic mouthwash  2 mL Oral QID   Continuous Infusions:  ampicillin-sulbactam (UNASYN) IV 3 g (02/08/23 1122)   dextrose 5% lactated ringers 100 mL/hr at 02/08/23 0610   methocarbamol (ROBAXIN) IV 1,000 mg (02/08/23 1218)    Diet Order             Diet clear liquid Room service appropriate? Yes; Fluid consistency: Thin  Diet effective now                  Intake/Output Summary (Last 24 hours) at 02/08/2023 1610 Last data filed at 02/08/2023 1400 Gross per 24 hour  Intake 4082.52 ml  Output 1650 ml  Net 2432.52 ml   Net IO Since Admission: 7,136.21 mL [02/08/23 1610]  Wt Readings from Last 3 Encounters:  02/04/23 91.5 kg  10/17/22 91.6 kg  04/18/22 95.7 kg     Unresulted Labs (From admission, onward)     Start     Ordered   02/05/23 0500  Basic metabolic panel  Daily,   R      02/04/23 0841   02/05/23 0500  CBC  Daily,   R      02/04/23 0841          Data Reviewed: I have personally reviewed following labs and imaging studies CBC: Recent Labs  Lab 02/03/23 1516 02/04/23 0416 02/05/23 0438  02/06/23 0430 02/07/23 0604 02/08/23 0436  WBC 5.4 3.8* 5.9 6.8 7.4 8.0  NEUTROABS 3.3  --   --   --   --   --   HGB 13.9 12.3 11.7* 11.2* 11.1* 10.7*  HCT 43.8 39.3 37.6 35.9* 36.9 34.3*  MCV 96.5 98.7 98.7 97.8 101.4* 98.3  PLT 350 325 278 279 268 275   Basic Metabolic Panel: Recent Labs  Lab 02/04/23 0416 02/05/23 0438 02/06/23 0430 02/07/23 0604 02/08/23 0436  NA 139 142 139 136 143  K 3.9 3.9 3.4* 3.3* 3.2*  CL 107 106 105 103 108  CO2 24 26 27 26 26   GLUCOSE 135* 143* 263* 270* 118*  BUN 18 16 14 12 12   CREATININE 0.84 0.96 0.84 0.66 0.60  CALCIUM 9.8 9.8 9.1 9.2 9.6  MG  --   --   --   --  2.3   GFR: Estimated Creatinine Clearance: 72.7 mL/min (by C-G formula based on SCr of 0.6  mg/dL). Liver Function Tests: Recent Labs  Lab 02/03/23 1516 02/04/23 0416  AST 18 14*  ALT 14 12  ALKPHOS 90 75  BILITOT 0.9 1.0  PROT 7.7 7.1  ALBUMIN 3.7 3.3*   Recent Labs  Lab 02/03/23 1516  LIPASE 29  Antimicrobials: Anti-infectives (From admission, onward)    Start     Dose/Rate Route Frequency Ordered Stop   02/08/23 1000  azithromycin (ZITHROMAX) tablet 250 mg       Placed in "Followed by" Linked Group   250 mg Oral Daily 02/07/23 0912 02/12/23 0959   02/07/23 1200  Ampicillin-Sulbactam (UNASYN) 3 g in sodium chloride 0.9 % 100 mL IVPB        3 g 200 mL/hr over 30 Minutes Intravenous Every 6 hours 02/07/23 0946     02/07/23 1000  azithromycin (ZITHROMAX) tablet 500 mg       Placed in "Followed by" Linked Group   500 mg Oral Daily 02/07/23 0912 02/07/23 0932   02/04/23 0930  cefOXitin (MEFOXIN) 2 g in sodium chloride 0.9 % 100 mL IVPB        2 g 200 mL/hr over 30 Minutes Intravenous  Once 02/04/23 0844 02/04/23 1011      Culture/Microbiology    Component Value Date/Time   SDES EXPECTORATED SPUTUM 02/06/2023 1645   SDES  02/06/2023 1645    EXPECTORATED SPUTUM Performed at [facility], [address], [place], [place] [address]     SPECREQUEST NONE 02/06/2023 1645   SPECREQUEST  02/06/2023 1645    NONE Reflexed from [id number] Performed at [facility], [address], [place], [place] [address]    CULT  02/06/2023 1645    MODERATE Normal respiratory flora-no Staph aureus or Pseudomonas seen Performed at [facility], [address] [place], [place] [address]    REPTSTATUS 02/06/2023 FINAL 02/06/2023 1645   REPTSTATUS 02/08/2023 FINAL 02/06/2023 1645    Radiology Studies: DG CHEST PORT 1 VIEW  Result Date: 02/06/2023 CLINICAL DATA:  Fever abdominal pain EXAM: PORTABLE CHEST 1 VIEW COMPARISON:  03/31/2020 FINDINGS: Esophageal tube tip below the diaphragm and curled at the fundus of stomach. Low lung volumes with atelectasis or pneumonia at the right base. Mild cardiomegaly with aortic atherosclerosis. IMPRESSION: Low lung volumes with atelectasis or pneumonia at the right base. Electronically Signed   By: Jasmine Pang M.D.   On: 02/06/2023 22:01    LOS: 5 days  Arnetha Courser, MD [facility]  02/08/2023, 4:10 PM

## 2023-02-08 NOTE — Care Management Important Message (Signed)
Important Message  Patient Details  Name: Kayla Lowe MRN: [id number] Date of Birth: 03-06-1956   Important Message Given:        Mardene Sayer 02/08/2023, 11:49 AM

## 2023-02-08 NOTE — Progress Notes (Signed)
[facility] Progress Note  4 Days Post-Op  Subjective: CC:  NG removed yesterday. She is belching this morning. She also reports flatus and had a large BM yesterday. Denies nausea. Walking around independently in the room.  Objective: Vital signs in last 24 hours: Temp:  [98.5 F (36.9 C)-100.9 F (38.3 C)] 98.5 F (36.9 C) (09/27 0818) Pulse Rate:  [82-102] 89 (09/27 0818) Resp:  [14-19] 19 (09/27 0818) BP: (136-175)/(77-96) 161/79 (09/27 0818) SpO2:  [90 %-100 %] 100 % (09/27 0818) Last BM Date : 02/07/23  Intake/Output from previous day: 09/26 0701 - 09/27 0700 In: 3527.5 [P.O.:30; I.V.:2152.2; IV Piggyback:1345.3] Out: 1550 [Urine:1150; Emesis/NG output:400] Intake/Output this shift: No intake/output data recorded.  PE: Gen:  Alert, NAD, cooperative  Card:  Regular rate Pulm:  Normal effort Abd: Soft, mild distention, incision c/d/i Skin: warm and dry, no rashes  Psych: A&Ox3   Lab Results:  Recent Labs    02/07/23 0604 02/08/23 0436  WBC 7.4 8.0  HGB 11.1* 10.7*  HCT 36.9 34.3*  PLT 268 275   BMET Recent Labs    02/07/23 0604 02/08/23 0436  NA 136 143  K 3.3* 3.2*  CL 103 108  CO2 26 26  GLUCOSE 270* 118*  BUN 12 12  CREATININE 0.66 0.60  CALCIUM 9.2 9.6   PT/INR No results for input(s): "LABPROT", "INR" in the last 72 hours. CMP     Component Value Date/Time   NA 143 02/08/2023 0436   NA 145 (H) 07/29/2018 1112   K 3.2 (L) 02/08/2023 0436   CL 108 02/08/2023 0436   CO2 26 02/08/2023 0436   GLUCOSE 118 (H) 02/08/2023 0436   BUN 12 02/08/2023 0436   BUN 8 07/29/2018 1112   CREATININE 0.60 02/08/2023 0436   CREATININE 0.87 08/03/2015 0942   CALCIUM 9.6 02/08/2023 0436   PROT 7.1 02/04/2023 0416   PROT 7.0 07/29/2018 1112   ALBUMIN 3.3 (L) 02/04/2023 0416   ALBUMIN 4.2 07/29/2018 1112   AST 14 (L) 02/04/2023 0416   ALT 12 02/04/2023 0416   ALKPHOS 75 02/04/2023 0416   BILITOT 1.0 02/04/2023 0416   BILITOT 0.5 07/29/2018  1112   GFRNONAA >60 02/08/2023 0436   GFRNONAA 67 06/18/2014 1714   GFRAA 75 07/29/2018 1112   GFRAA 77 06/18/2014 1714   Lipase     Component Value Date/Time   LIPASE 29 02/03/2023 1516       Studies/Results: DG CHEST PORT 1 VIEW  Result Date: 02/06/2023 CLINICAL DATA:  Fever abdominal pain EXAM: PORTABLE CHEST 1 VIEW COMPARISON:  03/31/2020 FINDINGS: Esophageal tube tip below the diaphragm and curled at the fundus of stomach. Low lung volumes with atelectasis or pneumonia at the right base. Mild cardiomegaly with aortic atherosclerosis. IMPRESSION: Low lung volumes with atelectasis or pneumonia at the right base. Electronically Signed   By: Jasmine Pang M.D.   On: 02/06/2023 22:01   DG Abd Portable 1V  Result Date: 02/06/2023 CLINICAL DATA:  Readjustment of NG tube. EXAM: PORTABLE ABDOMEN - 1 VIEW COMPARISON:  Earlier same day FINDINGS: Interval advancement of enteric tube with tip and side port now projecting over the expected location of the gastric fundus. Otherwise, stable examination. IMPRESSION: 1. Interval advancement of enteric tube with tip and side port now projecting over the expected location of the gastric fundus. 2. Otherwise, stable examination, as described on abdominal radiograph performed earlier same day. Electronically Signed   By: Simonne Come M.D.   On:  02/06/2023 11:50   DG Abd Portable 1V  Result Date: 02/06/2023 CLINICAL DATA:  Confirm NG tube placement. Postoperative nausea and vomiting. EXAM: PORTABLE ABDOMEN - 1 VIEW COMPARISON:  Earlier same day; 02/04/2023 FINDINGS: Interval retraction enteric tube with tip and side port projected over the distal esophagus. Advancement at least 17 cm is advised. Redemonstrated moderate-to-marked gaseous distention of multiple loops of small bowel with index loop of small bowel within the right mid abdomen measuring 5.3 cm in diameter. Midline abdominal staples. Limited visualization of the lower thorax demonstrates trace  right-sided pleural effusion with bibasilar heterogeneous/consolidative opacities, right-greater-than-left. No acute osseous abnormalities. Mild scoliotic curvature of the thoracolumbar spine with associated multilevel DDD, incompletely evaluated. IMPRESSION: 1. Interval retraction enteric tube with tip and side port projected over the distal esophagus. Advancement at least 17 cm is advised. 2. Redemonstrated moderate-to-marked gaseous distention of multiple loops of small bowel, nonspecific though worrisome for small bowel obstruction. 3. Trace right-sided pleural effusion with bibasilar heterogeneous/consolidative opacities, right-greater-than-left, atelectasis versus infiltrate. Electronically Signed   By: Simonne Come M.D.   On: 02/06/2023 11:49    Anti-infectives: Anti-infectives (From admission, onward)    Start     Dose/Rate Route Frequency Ordered Stop   02/08/23 1000  azithromycin (ZITHROMAX) tablet 250 mg       Placed in "Followed by" Linked Group   250 mg Oral Daily 02/07/23 0912 02/12/23 0959   02/07/23 1200  Ampicillin-Sulbactam (UNASYN) 3 g in sodium chloride 0.9 % 100 mL IVPB        3 g 200 mL/hr over 30 Minutes Intravenous Every 6 hours 02/07/23 0946     02/07/23 1000  azithromycin (ZITHROMAX) tablet 500 mg       Placed in "Followed by" Linked Group   500 mg Oral Daily 02/07/23 0912 02/07/23 0932   02/04/23 0930  cefOXitin (MEFOXIN) 2 g in sodium chloride 0.9 % 100 mL IVPB        2 g 200 mL/hr over 30 Minutes Intravenous  Once 02/04/23 0844 02/04/23 1011        Assessment/Plan  SBO  S/p exploratory laparotomy, small bowel resection 9/23 DB POD#4 AFVSS  Surgical pathology: 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  She will need outpatient oncology follow up   - NG out, allow CLD. Will consider diet advancement pending toleration of  clears. - start PO pain control - abx for HCAP per primary team  Foley removed and pt  voiding independently.  daily lovenox for DVT ppx   LOS: 5 days   I reviewed nursing notes, hospitalist notes, last 24 h vitals and pain scores, last 48 h intake and output, last 24 h labs and trends, and last 24 h imaging results.  Hosie Spangle, PA-C [facility] Please see Amion for pager number during day hours 7:00am-4:30pm

## 2023-02-08 NOTE — Progress Notes (Signed)
Mobility Specialist - Progress Note   02/08/23 0910  Mobility  Activity Ambulated with assistance in hallway  Level of Assistance Modified independent, requires aide device or extra time  Assistive Device Other (Comment) (IV Pole)  Distance Ambulated (ft) 265 ft  Activity Response Tolerated well  Mobility Referral Yes  $Mobility charge 1 Mobility  Mobility Specialist Start Time (ACUTE ONLY) 0857  Mobility Specialist Stop Time (ACUTE ONLY) 0910  Mobility Specialist Time Calculation (min) (ACUTE ONLY) 13 min   Pt received in bed and agreeable to mobility. No complaints during session. Pt to recliner after session with all needs met.    [facility]

## 2023-02-08 NOTE — Plan of Care (Signed)
  Problem: Activity: Goal: Risk for activity intolerance will decrease Outcome: Progressing   Problem: Pain Managment: Goal: General experience of comfort will improve Outcome: Progressing   Problem: Safety: Goal: Ability to remain free from injury will improve Outcome: Progressing   

## 2023-02-09 ENCOUNTER — Inpatient Hospital Stay (HOSPITAL_COMMUNITY): Payer: Medicare Other

## 2023-02-09 DIAGNOSIS — E785 Hyperlipidemia, unspecified: Secondary | ICD-10-CM | POA: Diagnosis not present

## 2023-02-09 DIAGNOSIS — K56609 Unspecified intestinal obstruction, unspecified as to partial versus complete obstruction: Secondary | ICD-10-CM | POA: Diagnosis not present

## 2023-02-09 DIAGNOSIS — K219 Gastro-esophageal reflux disease without esophagitis: Secondary | ICD-10-CM | POA: Diagnosis not present

## 2023-02-09 DIAGNOSIS — I10 Essential (primary) hypertension: Secondary | ICD-10-CM | POA: Diagnosis not present

## 2023-02-09 LAB — BASIC METABOLIC PANEL
Anion gap: 11 (ref 5–15)
BUN: 10 mg/dL (ref 8–23)
CO2: 24 mmol/L (ref 22–32)
Calcium: 10 mg/dL (ref 8.9–10.3)
Chloride: 106 mmol/L (ref 98–111)
Creatinine, Ser: 0.6 mg/dL (ref 0.44–1.00)
GFR, Estimated: >60 mL/min (ref >60)
Glucose, Bld: 116 mg/dL — ABNORMAL HIGH (ref 70–99)
Potassium: 3.2 mmol/L — ABNORMAL LOW (ref 3.5–5.1)
Sodium: 141 mmol/L (ref 135–145)

## 2023-02-09 LAB — CBC
HCT: 38.1 % (ref 36.0–46.0)
Hemoglobin: 12 g/dL (ref 12.0–15.0)
MCH: 30 pg (ref 26.0–34.0)
MCHC: 31.5 g/dL (ref 30.0–36.0)
MCV: 95.3 fL (ref 80.0–100.0)
Platelets: 321 10*3/uL (ref 150–400)
RBC: 4 MIL/uL (ref 3.87–5.11)
RDW: 13.8 % (ref 11.5–15.5)
WBC: 12.7 10*3/uL — ABNORMAL HIGH (ref 4.0–10.5)
nRBC: 0.9 % — ABNORMAL HIGH (ref 0.0–0.2)

## 2023-02-09 MED ORDER — HYDRALAZINE HCL 25 MG PO TABS
25.0000 mg | ORAL_TABLET | Freq: Three times a day (TID) | ORAL | Status: DC
  Administered 2023-02-09 – 2023-02-15 (×19): 25 mg via ORAL
  Filled 2023-02-09 (×19): qty 1

## 2023-02-09 MED ORDER — ALUM & MAG HYDROXIDE-SIMETH 200-200-20 MG/5ML PO SUSP
30.0000 mL | Freq: Once | ORAL | Status: AC
  Administered 2023-02-09: 30 mL via ORAL
  Filled 2023-02-09: qty 30

## 2023-02-09 MED ORDER — EZETIMIBE 10 MG PO TABS
10.0000 mg | ORAL_TABLET | Freq: Every day | ORAL | Status: DC
  Administered 2023-02-09 – 2023-02-15 (×7): 10 mg via ORAL
  Filled 2023-02-09 (×7): qty 1

## 2023-02-09 MED ORDER — CARVEDILOL 6.25 MG PO TABS
6.2500 mg | ORAL_TABLET | Freq: Two times a day (BID) | ORAL | Status: DC
  Administered 2023-02-09 – 2023-02-15 (×12): 6.25 mg via ORAL
  Filled 2023-02-09 (×12): qty 1

## 2023-02-09 MED ORDER — MONTELUKAST SODIUM 10 MG PO TABS
10.0000 mg | ORAL_TABLET | Freq: Every day | ORAL | Status: DC
  Administered 2023-02-09 – 2023-02-14 (×6): 10 mg via ORAL
  Filled 2023-02-09 (×6): qty 1

## 2023-02-09 MED ORDER — AMLODIPINE BESYLATE 10 MG PO TABS
10.0000 mg | ORAL_TABLET | Freq: Every day | ORAL | Status: DC
  Administered 2023-02-09 – 2023-02-15 (×7): 10 mg via ORAL
  Filled 2023-02-09 (×7): qty 1

## 2023-02-09 MED ORDER — FAMOTIDINE 20 MG PO TABS
40.0000 mg | ORAL_TABLET | Freq: Every day | ORAL | Status: DC
  Administered 2023-02-09 – 2023-02-14 (×6): 40 mg via ORAL
  Filled 2023-02-09 (×6): qty 2

## 2023-02-09 MED ORDER — PRAVASTATIN SODIUM 20 MG PO TABS
40.0000 mg | ORAL_TABLET | Freq: Every day | ORAL | Status: DC
  Administered 2023-02-09 – 2023-02-14 (×6): 40 mg via ORAL
  Filled 2023-02-09 (×7): qty 2

## 2023-02-09 MED ORDER — PANTOPRAZOLE SODIUM 40 MG PO TBEC
40.0000 mg | DELAYED_RELEASE_TABLET | Freq: Every day | ORAL | Status: DC
  Administered 2023-02-10 – 2023-02-15 (×6): 40 mg via ORAL
  Filled 2023-02-09 (×6): qty 1

## 2023-02-09 MED ORDER — ASPIRIN 81 MG PO TBEC
81.0000 mg | DELAYED_RELEASE_TABLET | Freq: Every day | ORAL | Status: DC
  Administered 2023-02-09 – 2023-02-15 (×7): 81 mg via ORAL
  Filled 2023-02-09 (×7): qty 1

## 2023-02-09 MED ORDER — PANTOPRAZOLE SODIUM 20 MG PO TBEC
20.0000 mg | DELAYED_RELEASE_TABLET | Freq: Two times a day (BID) | ORAL | Status: DC
  Administered 2023-02-09: 20 mg via ORAL
  Filled 2023-02-09: qty 1

## 2023-02-09 NOTE — Progress Notes (Signed)
5 Days Post-Op   Subjective/Chief Complaint: Patient doing well.  She states that she is having some reflux issues overnight. Minimal abdominal pain   Objective: Vital signs in last 24 hours: Temp:  [98 F (36.7 C)-99.1 F (37.3 C)] 98.6 F (37 C) (09/28 0756) Pulse Rate:  [89-98] 93 (09/28 0756) Resp:  [18-19] 18 (09/28 0523) BP: (152-184)/(78-104) 152/82 (09/28 0756) SpO2:  [92 %-100 %] 100 % (09/28 0756) Last BM Date : 02/07/23  Intake/Output from previous day: 09/27 0701 - 09/28 0700 In: 4239.1 [P.O.:1320; I.V.:2079.1; IV Piggyback:840] Out: 900 [Urine:900] Intake/Output this shift: No intake/output data recorded.  General appearance: alert and cooperative GI: soft, non-tender; bowel sounds normal; no masses,  no organomegaly and incisions clean dry and intact.  Lab Results:  Recent Labs    02/07/23 0604 02/08/23 0436  WBC 7.4 8.0  HGB 11.1* 10.7*  HCT 36.9 34.3*  PLT 268 275   BMET Recent Labs    02/07/23 0604 02/08/23 0436  NA 136 143  K 3.3* 3.2*  CL 103 108  CO2 26 26  GLUCOSE 270* 118*  BUN 12 12  CREATININE 0.66 0.60  CALCIUM 9.2 9.6   PT/INR No results for input(s): "LABPROT", "INR" in the last 72 hours. ABG No results for input(s): "PHART", "HCO3" in the last 72 hours.  Invalid input(s): "PCO2", "PO2"  Studies/Results: No results found.  Anti-infectives: Anti-infectives (From admission, onward)    Start     Dose/Rate Route Frequency Ordered Stop   02/08/23 1000  azithromycin (ZITHROMAX) tablet 250 mg       Placed in "Followed by" Linked Group   250 mg Oral Daily 02/07/23 0912 02/12/23 0959   02/07/23 1200  Ampicillin-Sulbactam (UNASYN) 3 g in sodium chloride 0.9 % 100 mL IVPB        3 g 200 mL/hr over 30 Minutes Intravenous Every 6 hours 02/07/23 0946     02/07/23 1000  azithromycin (ZITHROMAX) tablet 500 mg       Placed in "Followed by" Linked Group   500 mg Oral Daily 02/07/23 0912 02/07/23 0932   02/04/23 0930  cefOXitin  (MEFOXIN) 2 g in sodium chloride 0.9 % 100 mL IVPB        2 g 200 mL/hr over 30 Minutes Intravenous  Once 02/04/23 0844 02/04/23 1011       Assessment/Plan: SBO  S/p exploratory laparotomy, small bowel resection 9/23 DB POD#5 AFVSS  Surgical pathology: 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  She will need outpatient oncology follow up    - adv to FLD. Will consider diet advancement pending toleration of  clears. - start PO pain control - abx for HCAP per primary team -increase GERD meds   Foley removed and pt voiding independently.  daily lovenox for DVT ppx    LOS: 6 days    Axel Filler 02/09/2023

## 2023-02-09 NOTE — Progress Notes (Signed)
Mobility Specialist - Progress Note   02/09/23 0907  Mobility  Activity Ambulated with assistance in hallway  Level of Assistance Modified independent, requires aide device or extra time  Assistive Device Other (Comment) (IV Pole)  Distance Ambulated (ft) 265 ft  Activity Response Tolerated well  Mobility Referral Yes  $Mobility charge 1 Mobility  Mobility Specialist Start Time (ACUTE ONLY) [id number]  Mobility Specialist Stop Time (ACUTE ONLY) 0907  Mobility Specialist Time Calculation (min) (ACUTE ONLY) 15 min   Pt received in recliner and agreeable to mobility. No complaints during session. Pt to recliner after session with all needs met.    [facility]

## 2023-02-09 NOTE — Progress Notes (Deleted)
Pt had been nauseas and been frequently vomiting small amount of emesis. Educated pt on possible insertion of NGT. Pt expressed to get NGT while asleep.

## 2023-02-09 NOTE — Progress Notes (Signed)
PROGRESS NOTE Kayla Lowe  BJY:[id number] DOB: November 25, 1955 DOA: 02/03/2023 PCP: Kayla Grandchild, MD  Brief Narrative/Hospital Course: 67 year old female with GAD, HTN HLD osteoarthritis history of total abdominal hysterectomy 987 pagetoid nausea right and left upper quadrant pain along with diarrhea of 2 days without bleeding per rectum. Seen in the ED underwent imaging study with CT scan showing signs of small bowel obstruction, general surgery was consulted and admitted with NG tube decompression IV fluid hydration.  Labs has been unremarkable with stable renal function CBC. 9/23> status post exploratory laparotomy, found to have very large small bowel mass, not obstructing, sent for biopsy no large palpable lymph nodes seen in the mesentery.  9/25: Patient developed fever today, having some cough.  NG tube remained in place, no BM or passing flatus.  Ordered chest x-ray and blood cultures.  Holding antibiotics for now. Surgery might repeat CT abdomen.  9/26: Recorded fever of 101.2 over the past 24-hour, worsening cough and chest x-ray with concern of right lower lobe infiltrate.  Preliminary blood cultures negative in 24-hour, starting on Unasyn and Zithromax.  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  9/27: Recorded temperature of 100.9 over the past 24-hour, blood cultures remain negative, respiratory cultures pending, labs overall stable with mild hypokalemia which is being repleted. NG tube was removed and started on diet.  Had a bowel movement.  9/28: Remained afebrile over the past 24 hours, developed some nausea and vomiting overnight which she think is due to her worsening GERD as she was not taking home meds which were restarted.  Having bowel movement and passing flatus.  Pathology with marginal zone lymphoma.     Subjective: Patient was seen and examined today.  Having some nausea and vomited once overnight.  Patient thinks that this is due to her worsening GERD symptoms when she started p.o. intake.  She was not taking her home Protonix and Pepcid which were restarted.  Passing flatus and had a bowel movement   Assessment and Plan: Principal Problem:   SBO (small bowel obstruction) (HCC) Active Problems:   Dyslipidemia, goal LDL below 130   Essential hypertension, benign   Gastroesophageal reflux disease without esophagitis   Stage 3a chronic kidney disease (HCC)   Small bowel obstruction Small bowel mass: 9/23> status post exploratory laparotomy, found to have very large small bowel mass, not obstructing, sent for biopsy no large palpable lymph nodes seen in the mesentery.  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 -Continue with supportive care -Improving and started on diet, having bowel movement.  Fever.  Can be due to recent surgery.  Having mild cough.  No leukocytosis.  Procalcitonin elevated at 0.35, chest x-ray concerning for a new right lower lobe infiltrate. -Starting on Unasyn and Zithromax -Continue to monitor  GERD: Stable on PPI  Essential hypertension: BP remains controlled on prn HLD: Statin on hold as n.p.o. CKD stage II: Based on GFR patient has CKD stage II, renal function remains stable and at baseline, monitor.  Obesity:Patient's Body mass index is 34.63 kg/m. : Will benefit with PCP follow-up, weight loss and healthy lifestyle  DVT prophylaxis: enoxaparin (LOVENOX) injection 40 mg Start: 02/06/23 1200 Place and maintain sequential compression device Start: 02/04/23 1050scd added-start chemical prophylaxis once okay  with surgery post op Code Status:   Code Status: Full Code Family  Communication: Discussed with  family friends at bedside Patient status is: Inpatient because of SBO Level of care: Med-Surg   Dispo: The patient is from: Home            Anticipated disposition: tbd  Objective: Vitals last 24 hrs: Vitals:   02/09/23 0136 02/09/23 0523 02/09/23 0756 02/09/23 1154  BP: (!) 153/78 (!) 177/90 (!) 152/82 (!) 167/92  Pulse: 96 98 93 95  Resp: 18 18 18 18   Temp: 98.5 F (36.9 C) 98 F (36.7 C) 98.6 F (37 C) 98.7 F (37.1 C)  TempSrc: Oral Oral Oral Oral  SpO2: 95% 99% 100% 92%  Weight:      Height:       Weight change:   Physical Examination:  General.  Obese lady, in no acute distress. Pulmonary.  Lungs clear bilaterally, normal respiratory effort. CV.  Regular rate and rhythm, no JVD, rub or murmur. Abdomen.  Soft, nontender, nondistended, BS positive. CNS.  Alert and oriented .  No focal neurologic deficit. Extremities.  No edema, no cyanosis, pulses intact and symmetrical. Psychiatry.  Judgment and insight appears normal.   Medications reviewed:  Scheduled Meds:  acetaminophen  1,000 mg Oral TID   amLODipine  10 mg Oral Daily   aspirin EC  81 mg Oral Daily   azithromycin  250 mg Oral Daily   carvedilol  6.25 mg Oral BID WC   enoxaparin (LOVENOX) injection  40 mg Subcutaneous Q24H   ezetimibe  10 mg Oral Daily   famotidine  40 mg Oral QHS   hydrALAZINE  25 mg Oral Q8H   ketorolac  15 mg Intravenous Q6H   lidocaine  2 patch Transdermal Q24H   magic mouthwash  2 mL Oral QID   montelukast  10 mg Oral QHS   [START ON 02/10/2023] pantoprazole  40 mg Oral Daily   pravastatin  40 mg Oral q1800   Continuous Infusions:  ampicillin-sulbactam (UNASYN) IV 3 g (02/09/23 1314)   dextrose 5% lactated ringers 100 mL/hr at 02/09/23 0600   methocarbamol (ROBAXIN) IV 1,000 mg (02/09/23 1300)    Diet Order             Diet full liquid Room service appropriate? Yes; Fluid consistency: Thin  Diet effective now                   Intake/Output Summary (Last 24 hours) at 02/09/2023 1418 Last data filed at 02/09/2023 0523 Gross per 24 hour  Intake 2010.91 ml  Output 400 ml  Net 1610.91 ml   Net IO Since Admission: 8,747.12 mL [02/09/23 1418]  Wt Readings from Last 3 Encounters:  02/04/23 91.5 kg  10/17/22 91.6 kg  04/18/22 95.7 kg     Unresulted Labs (From admission, onward)    None     Data Reviewed: I have personally reviewed following labs and imaging studies CBC: Recent Labs  Lab 02/03/23 1516 02/04/23 0416 02/05/23 0438 02/06/23 0430 02/07/23 0604 02/08/23 0436 02/09/23 0839  WBC 5.4   < > 5.9 6.8 7.4 8.0 12.7*  NEUTROABS 3.3  --   --   --   --   --   --   HGB 13.9   < > 11.7* 11.2* 11.1* 10.7* 12.0  HCT 43.8   < > 37.6 35.9* 36.9 34.3* 38.1  MCV 96.5   < > 98.7 97.8 101.4* 98.3 95.3  PLT 350   < >  278 279 268 275 321   < > = values in this interval not displayed.   Basic Metabolic Panel: Recent Labs  Lab 02/05/23 0438 02/06/23 0430 02/07/23 0604 02/08/23 0436 02/09/23 0839  NA 142 139 136 143 141  K 3.9 3.4* 3.3* 3.2* 3.2*  CL 106 105 103 108 106  CO2 26 27 26 26 24   GLUCOSE 143* 263* 270* 118* 116*  BUN 16 14 12 12 10   CREATININE 0.96 0.84 0.66 0.60 0.60  CALCIUM 9.8 9.1 9.2 9.6 10.0  MG  --   --   --  2.3  --    GFR: Estimated Creatinine Clearance: 72.7 mL/min (by C-G formula based on SCr of 0.6 mg/dL). Liver Function Tests: Recent Labs  Lab 02/03/23 1516 02/04/23 0416  AST 18 14*  ALT 14 12  ALKPHOS 90 75  BILITOT 0.9 1.0  PROT 7.7 7.1  ALBUMIN 3.7 3.3*   Recent Labs  Lab 02/03/23 1516  LIPASE 29  Antimicrobials: Anti-infectives (From admission, onward)    Start     Dose/Rate Route Frequency Ordered Stop   02/08/23 1000  azithromycin (ZITHROMAX) tablet 250 mg       Placed in "Followed by" Linked Group   250 mg Oral Daily 02/07/23 0912 02/12/23 0959   02/07/23 1200  Ampicillin-Sulbactam (UNASYN) 3 g in sodium chloride 0.9 % 100 mL IVPB        3 g 200  mL/hr over 30 Minutes Intravenous Every 6 hours 02/07/23 0946     02/07/23 1000  azithromycin (ZITHROMAX) tablet 500 mg       Placed in "Followed by" Linked Group   500 mg Oral Daily 02/07/23 0912 02/07/23 0932   02/04/23 0930  cefOXitin (MEFOXIN) 2 g in sodium chloride 0.9 % 100 mL IVPB        2 g 200 mL/hr over 30 Minutes Intravenous  Once 02/04/23 0844 02/04/23 1011      Culture/Microbiology    Component Value Date/Time   SDES EXPECTORATED SPUTUM 02/06/2023 1645   SDES  02/06/2023 1645    EXPECTORATED SPUTUM Performed at [facility], [address], [place], [place] [address]    SPECREQUEST NONE 02/06/2023 1645   SPECREQUEST  02/06/2023 1645    NONE Reflexed from [id number] Performed at [facility], [address], [place], [place] [address]    CULT  02/06/2023 1645    MODERATE Normal respiratory flora-no Staph aureus or Pseudomonas seen Performed at [facility], [address] [place], [place] [address]    REPTSTATUS 02/06/2023 FINAL 02/06/2023 1645   REPTSTATUS 02/08/2023 FINAL 02/06/2023 1645    Radiology Studies: DG Abd 1 View  Result Date: 02/09/2023 CLINICAL DATA:  Abdominal distension.  Abdominal pain.  Diarrhea. EXAM: ABDOMEN - 1 VIEW COMPARISON:  02/06/2023 FINDINGS: No nasogastric tube is visible. Moderate amount of air present in the stomach. Dilated loops of small intestine are present which could be due to ileus or partial small bowel obstruction. Colon pattern is normal. IMPRESSION: 1. No nasogastric tube visible. 2. Dilated loops of small intestine that could be due to ileus or partial small bowel obstruction. Similar appearance to the study of 3 days ago. Electronically Signed   By: Paulina Fusi M.D.   On: 02/09/2023 13:19    LOS: 6 days  Arnetha Courser, MD [facility]  02/09/2023, 2:18 PM

## 2023-02-10 DIAGNOSIS — K56609 Unspecified intestinal obstruction, unspecified as to partial versus complete obstruction: Secondary | ICD-10-CM | POA: Diagnosis not present

## 2023-02-10 MED ORDER — POTASSIUM CHLORIDE 10 MEQ/100ML IV SOLN
10.0000 meq | INTRAVENOUS | Status: AC
  Administered 2023-02-10 (×3): 10 meq via INTRAVENOUS
  Filled 2023-02-10: qty 100

## 2023-02-10 NOTE — Progress Notes (Signed)
Mobility Specialist - Progress Note   02/10/23 1001  Mobility  Activity Ambulated with assistance in hallway  Level of Assistance Modified independent, requires aide device or extra time  Assistive Device Other (Comment) (IV Pole)  Distance Ambulated (ft) 275 ft  Activity Response Tolerated well  Mobility Referral Yes  $Mobility charge 1 Mobility  Mobility Specialist Start Time (ACUTE ONLY) [id number]  Mobility Specialist Stop Time (ACUTE ONLY) 1001  Mobility Specialist Time Calculation (min) (ACUTE ONLY) 13 min   Pt received in recliner and agreeable to mobility. No complaints during session. Pt to recliner after session with all needs met.    [facility]

## 2023-02-10 NOTE — Progress Notes (Addendum)
6 Days Post-Op   Subjective/Chief Complaint: Patient doing well today.  Reflux better controlled. Patient continues with multiple bowel movements. Patient up and walking.   Objective: Vital signs in last 24 hours: Temp:  [98.5 F (36.9 C)-99 F (37.2 C)] 99 F (37.2 C) (09/29 0500) Pulse Rate:  [90-99] 90 (09/29 0500) Resp:  [16-18] 16 (09/29 0500) BP: (138-185)/(84-100) 138/84 (09/29 0500) SpO2:  [90 %-96 %] 90 % (09/29 0500) Last BM Date : 02/09/23  Intake/Output from previous day: 09/28 0701 - 09/29 0700 In: 3325.6 [P.O.:360; I.V.:2072.8; IV Piggyback:892.9] Out: 102 [Urine:100; Stool:2] Intake/Output this shift: No intake/output data recorded.  General appearance: alert and cooperative GI: soft, non-tender; bowel sounds normal; no masses,  no organomegaly and inc c d i  Lab Results:  Recent Labs    02/08/23 0436 02/09/23 0839  WBC 8.0 12.7*  HGB 10.7* 12.0  HCT 34.3* 38.1  PLT 275 321   BMET Recent Labs    02/08/23 0436 02/09/23 0839  NA 143 141  K 3.2* 3.2*  CL 108 106  CO2 26 24  GLUCOSE 118* 116*  BUN 12 10  CREATININE 0.60 0.60  CALCIUM 9.6 10.0   PT/INR No results for input(s): "LABPROT", "INR" in the last 72 hours. ABG No results for input(s): "PHART", "HCO3" in the last 72 hours.  Invalid input(s): "PCO2", "PO2"  Studies/Results: DG Abd 1 View  Result Date: 02/09/2023 CLINICAL DATA:  Abdominal distension.  Abdominal pain.  Diarrhea. EXAM: ABDOMEN - 1 VIEW COMPARISON:  02/06/2023 FINDINGS: No nasogastric tube is visible. Moderate amount of air present in the stomach. Dilated loops of small intestine are present which could be due to ileus or partial small bowel obstruction. Colon pattern is normal. IMPRESSION: 1. No nasogastric tube visible. 2. Dilated loops of small intestine that could be due to ileus or partial small bowel obstruction. Similar appearance to the study of 3 days ago. Electronically Signed   By: Paulina Fusi M.D.   On:  02/09/2023 13:19    Anti-infectives: Anti-infectives (From admission, onward)    Start     Dose/Rate Route Frequency Ordered Stop   02/08/23 1000  azithromycin (ZITHROMAX) tablet 250 mg       Placed in "Followed by" Linked Group   250 mg Oral Daily 02/07/23 0912 02/12/23 0959   02/07/23 1200  Ampicillin-Sulbactam (UNASYN) 3 g in sodium chloride 0.9 % 100 mL IVPB        3 g 200 mL/hr over 30 Minutes Intravenous Every 6 hours 02/07/23 0946     02/07/23 1000  azithromycin (ZITHROMAX) tablet 500 mg       Placed in "Followed by" Linked Group   500 mg Oral Daily 02/07/23 0912 02/07/23 0932   02/04/23 0930  cefOXitin (MEFOXIN) 2 g in sodium chloride 0.9 % 100 mL IVPB        2 g 200 mL/hr over 30 Minutes Intravenous  Once 02/04/23 0844 02/04/23 1011       Assessment/Plan: SBO  S/p exploratory laparotomy, small bowel resection 9/23 DB POD#6 AFVSS  Surgical pathology: 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  She will need outpatient oncology follow up    - con't CLD.  - start PO pain control - abx for HCAP per primary team -increase GERD meds   Foley removed and pt voiding independently.  daily lovenox for DVT ppx  LOS: 7 days    Axel Filler 02/10/2023

## 2023-02-10 NOTE — Progress Notes (Addendum)
PROGRESS NOTE  Kayla Lowe HYQ:[id number] DOB: Mar 18, 1956 DOA: 02/03/2023 PCP: Etta Grandchild, MD  HPI/Recap of past 24 hours: Kayla Lowe is a 67 y.o. female with medical history significant of GERD, essential hypertension, hyperlipidemia, osteoarthritis, history of total abdominal hysterectomy in 1987 who presents to the ER with nausea and pain is in the right and left upper quadrants.  Associated with diarrhea for last 2 days.  CT abdomen pelvis showed signs of small bowel obstruction.  Seen by general surgery, status post exploratory laparotomy, small bowel resection on 02/04/2023.  Reports multiple bowel movements.  She has been up and walking.  Concern for pneumonia seen on chest x-ray done on 02/06/2023, was started on Unasyn and Z-Pak empirically.  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  The patient will need outpatient oncology follow-up.  02/10/2023: The patient was seen and examined at bedside.  A close friend present at bedside.  Denies having any significant abdominal pain.  Positive flatulence and bowel movement.    Assessment/Plan: Principal Problem:   SBO (small bowel obstruction) (HCC) Active Problems:   Dyslipidemia, goal LDL below 130   Essential hypertension, benign   Gastroesophageal reflux disease without esophagitis   Stage 3a chronic kidney disease (HCC)  Small bowel obstruction, POA, status post exploratory laparotomy, on 02/04/2023. 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 The patient will need to follow-up with oncology outpatient. Continue to mobilize as tolerated Continue IV fluid hydration Pain control with p.o. pain medication  Right lower lobe pneumonia, POA Continue Unasyn and Z-Pak Monitor fever curve and WBCs Maintain O2 saturation above  92%  Hypertension BP is not at goal, elevated. Continue home oral antihypertensive Closely monitor vital signs  Hyperlipidemia Resume home regimen.  Asthma Resume home bronchodilator. Resume home regimen.  Obesity BMI 34 Recommend weight loss outpatient regular physical activity and healthy dieting.  Hypokalemia Serum potassium 3.2 Repleted intravenously. Repeat BMP in the morning Obtain magnesium level.  Time: 55 minutes.  Code Status: Full code  Family Communication: None at bedside  Disposition Plan: Likely will discharge to home once general surgery was consulted.   Consultants: General surgery.  Procedures: Exploratory laparotomy  Antimicrobials: Unasyn, Z-Pak.  DVT prophylaxis: Subcu Lovenox daily  Status is: Inpatient The patient requires at least 2 midnights for further evaluation and treatment of present condition.    Objective: Vitals:   02/10/23 0130 02/10/23 0500 02/10/23 0925 02/10/23 1312  BP: (!) 175/85 138/84 (!) 149/81 (!) 154/84  Pulse:  90 87 89  Resp: 17 16 15 18   Temp: 98.5 F (36.9 C) 99 F (37.2 C) 98.4 F (36.9 C) 98.8 F (37.1 C)  TempSrc: Oral Oral Oral   SpO2: 96% 90% 99% 97%  Weight:      Height:        Intake/Output Summary (Last 24 hours) at 02/10/2023 1541 Last data filed at 02/10/2023 1500 Gross per 24 hour  Intake 3705.7 ml  Output 101 ml  Net 3604.7 ml   Filed Weights   02/03/23 1351 02/04/23 1335  Weight: 91.5 kg 91.5 kg    Exam:  General: 67 y.o. year-old female well developed well nourished in no acute distress.  Alert and oriented x3. Cardiovascular: Regular rate and rhythm with no rubs or gallops.  No thyromegaly or JVD noted.   Respiratory: Clear to auscultation with no wheezes or rales. Good inspiratory effort. Abdomen: Soft  nontender nondistended with normal bowel sounds x4 quadrants. Musculoskeletal: No lower extremity edema. 2/4 pulses in all 4 extremities. Skin: Surgical site appears clean  without drainage. Psychiatry: Mood is appropriate for condition and setting   Data Reviewed: CBC: Recent Labs  Lab 02/05/23 0438 02/06/23 0430 02/07/23 0604 02/08/23 0436 02/09/23 0839  WBC 5.9 6.8 7.4 8.0 12.7*  HGB 11.7* 11.2* 11.1* 10.7* 12.0  HCT 37.6 35.9* 36.9 34.3* 38.1  MCV 98.7 97.8 101.4* 98.3 95.3  PLT 278 279 268 275 321   Basic Metabolic Panel: Recent Labs  Lab 02/05/23 0438 02/06/23 0430 02/07/23 0604 02/08/23 0436 02/09/23 0839  NA 142 139 136 143 141  K 3.9 3.4* 3.3* 3.2* 3.2*  CL 106 105 103 108 106  CO2 26 27 26 26 24   GLUCOSE 143* 263* 270* 118* 116*  BUN 16 14 12 12 10   CREATININE 0.96 0.84 0.66 0.60 0.60  CALCIUM 9.8 9.1 9.2 9.6 10.0  MG  --   --   --  2.3  --    GFR: Estimated Creatinine Clearance: 72.7 mL/min (by C-G formula based on SCr of 0.6 mg/dL). Liver Function Tests: Recent Labs  Lab 02/04/23 0416  AST 14*  ALT 12  ALKPHOS 75  BILITOT 1.0  PROT 7.1  ALBUMIN 3.3*   No results for input(s): "LIPASE", "AMYLASE" in the last 168 hours. No results for input(s): "AMMONIA" in the last 168 hours. Coagulation Profile: No results for input(s): "INR", "PROTIME" in the last 168 hours. Cardiac Enzymes: No results for input(s): "CKTOTAL", "CKMB", "CKMBINDEX", "TROPONINI" in the last 168 hours. BNP (last 3 results) No results for input(s): "PROBNP" in the last 8760 hours. HbA1C: No results for input(s): "HGBA1C" in the last 72 hours. CBG: No results for input(s): "GLUCAP" in the last 168 hours. Lipid Profile: No results for input(s): "CHOL", "HDL", "LDLCALC", "TRIG", "CHOLHDL", "LDLDIRECT" in the last 72 hours. Thyroid Function Tests: No results for input(s): "TSH", "T4TOTAL", "FREET4", "T3FREE", "THYROIDAB" in the last 72 hours. Anemia Panel: No results for input(s): "VITAMINB12", "FOLATE", "FERRITIN", "TIBC", "IRON", "RETICCTPCT" in the last 72 hours. Urine analysis:    Component Value Date/Time   COLORURINE YELLOW 02/04/2023 0025    APPEARANCEUR CLEAR 02/04/2023 0025   LABSPEC 1.020 02/04/2023 0025   PHURINE 6.0 02/04/2023 0025   GLUCOSEU NEGATIVE 02/04/2023 0025   GLUCOSEU NEGATIVE 10/17/2022 0901   HGBUR NEGATIVE 02/04/2023 0025   BILIRUBINUR SMALL (A) 02/04/2023 0025   KETONESUR NEGATIVE 02/04/2023 0025   PROTEINUR NEGATIVE 02/04/2023 0025   UROBILINOGEN 0.2 10/17/2022 0901   NITRITE NEGATIVE 02/04/2023 0025   LEUKOCYTESUR NEGATIVE 02/04/2023 0025   Sepsis Labs: @LABRCNTIP (procalcitonin:4,lacticidven:4)  ) Recent Results (from the past 240 hour(s))  Surgical pcr screen     Status: None   Collection Time: 02/04/23  9:39 AM   Specimen: Nasal Mucosa; Nasal Swab  Result Value Ref Range Status   MRSA, PCR NEGATIVE NEGATIVE Final   Staphylococcus aureus NEGATIVE NEGATIVE Final    Comment: (NOTE) The Xpert SA Assay (FDA approved for NASAL specimens in patients 90 years of age and older), is one component of a comprehensive surveillance program. It is not intended to diagnose infection nor to guide or monitor treatment. Performed at [facility], [address] [place], [place] [address]   Culture, blood (Routine X 2) w Reflex to ID Panel     Status: None (Preliminary result)   Collection Time: 02/06/23  3:43 PM   Specimen: BLOOD LEFT ARM  Result  Value Ref Range Status   Specimen Description   Final    BLOOD LEFT ARM Performed at [facility], [address] [place], [place] [address]    Special Requests   Final    BOTTLES DRAWN AEROBIC AND ANAEROBIC Blood Culture adequate volume Performed at [facility], [address] [place], [place] [address]    Culture   Final    NO GROWTH 4 DAYS Performed at [facility], [address] [place], [place] [address]    Report Status PENDING  Incomplete  Culture, blood (Routine X 2) w Reflex to ID Panel     Status: None (Preliminary result)   Collection Time: 02/06/23  3:47 PM   Specimen: BLOOD LEFT ARM   Result Value Ref Range Status   Specimen Description   Final    BLOOD LEFT ARM Performed at [facility], [address] [place], [place] [address]    Special Requests   Final    BOTTLES DRAWN AEROBIC AND ANAEROBIC Blood Culture adequate volume Performed at [facility], [address], [place], [place] [address]    Culture   Final    NO GROWTH 4 DAYS Performed at [facility], [address] [place], [place] [address]    Report Status PENDING  Incomplete  Expectorated Sputum Assessment w Gram Stain, Rflx to Resp Cult     Status: None   Collection Time: 02/06/23  4:45 PM   Specimen: Expectorated Sputum  Result Value Ref Range Status   Specimen Description EXPECTORATED SPUTUM  Final   Special Requests NONE  Final   Sputum evaluation   Final    THIS SPECIMEN IS ACCEPTABLE FOR SPUTUM CULTURE Performed at [facility], [address], [place], [place] [address]    Report Status 02/06/2023 FINAL  Final  Culture, Respiratory w Gram Stain     Status: None   Collection Time: 02/06/23  4:45 PM  Result Value Ref Range Status   Specimen Description   Final    EXPECTORATED SPUTUM Performed at [facility], [address], [place], [place] [address]    Special Requests   Final    NONE Reflexed from [id number] Performed at [facility], [address] [place], [place] [address]    Gram Stain   Final    ABUNDANT WBC PRESENT, PREDOMINANTLY PMN MODERATE GRAM POSITIVE COCCI IN CHAINS IN CLUSTERS    Culture   Final    MODERATE Normal respiratory flora-no Staph aureus or Pseudomonas seen Performed at [facility], [address], [place], [place] [address]    Report Status 02/08/2023 FINAL  Final      Studies: No results found.  Scheduled Meds:  acetaminophen  1,000 mg Oral TID   amLODipine  10 mg Oral Daily   aspirin EC  81 mg Oral Daily   azithromycin  250 mg Oral Daily   carvedilol   6.25 mg Oral BID WC   enoxaparin (LOVENOX) injection  40 mg Subcutaneous Q24H   ezetimibe  10 mg Oral Daily   famotidine  40 mg Oral QHS   hydrALAZINE  25 mg Oral Q8H   ketorolac  15 mg Intravenous Q6H   lidocaine  2 patch Transdermal Q24H   magic mouthwash  2 mL Oral QID   montelukast  10 mg Oral QHS   pantoprazole  40 mg Oral Daily   pravastatin  40  mg Oral q1800    Continuous Infusions:  ampicillin-sulbactam (UNASYN) IV 3 g (02/10/23 1123)   dextrose 5% lactated ringers 100 mL/hr at 02/09/23 1820   methocarbamol (ROBAXIN) IV 1,000 mg (02/10/23 1038)     LOS: 7 days     Darlin Drop, MD [facility] Pager [phone]  If 7PM-7AM, please contact night-coverage [email or web address] Password [email or web address] 02/10/2023, 3:41 PM

## 2023-02-11 DIAGNOSIS — K56609 Unspecified intestinal obstruction, unspecified as to partial versus complete obstruction: Secondary | ICD-10-CM | POA: Diagnosis not present

## 2023-02-11 LAB — BASIC METABOLIC PANEL
Anion gap: 8 (ref 5–15)
BUN: 13 mg/dL (ref 8–23)
CO2: 23 mmol/L (ref 22–32)
Calcium: 8.8 mg/dL — ABNORMAL LOW (ref 8.9–10.3)
Chloride: 107 mmol/L (ref 98–111)
Creatinine, Ser: 0.67 mg/dL (ref 0.44–1.00)
GFR, Estimated: >60 mL/min (ref >60)
Glucose, Bld: 108 mg/dL — ABNORMAL HIGH (ref 70–99)
Potassium: 3 mmol/L — ABNORMAL LOW (ref 3.5–5.1)
Sodium: 138 mmol/L (ref 135–145)

## 2023-02-11 LAB — CBC
HCT: 32 % — ABNORMAL LOW (ref 36.0–46.0)
Hemoglobin: 10.2 g/dL — ABNORMAL LOW (ref 12.0–15.0)
MCH: 30.9 pg (ref 26.0–34.0)
MCHC: 31.9 g/dL (ref 30.0–36.0)
MCV: 97 fL (ref 80.0–100.0)
Platelets: 296 10*3/uL (ref 150–400)
RBC: 3.3 MIL/uL — ABNORMAL LOW (ref 3.87–5.11)
RDW: 14.4 % (ref 11.5–15.5)
WBC: 12.2 10*3/uL — ABNORMAL HIGH (ref 4.0–10.5)
nRBC: 0 % (ref 0.0–0.2)

## 2023-02-11 LAB — MAGNESIUM: Magnesium: 2.2 mg/dL (ref 1.7–2.4)

## 2023-02-11 LAB — CULTURE, BLOOD (ROUTINE X 2)
Culture: NO GROWTH
Culture: NO GROWTH

## 2023-02-11 MED ORDER — DEXTROSE IN LACTATED RINGERS 5 % IV SOLN: INTRAVENOUS | Status: AC

## 2023-02-11 MED ORDER — POTASSIUM CHLORIDE 10 MEQ/100ML IV SOLN
10.0000 meq | INTRAVENOUS | Status: AC
  Administered 2023-02-11 (×3): 10 meq via INTRAVENOUS
  Filled 2023-02-11: qty 100

## 2023-02-11 MED ORDER — POTASSIUM CHLORIDE CRYS ER 20 MEQ PO TBCR
20.0000 meq | EXTENDED_RELEASE_TABLET | Freq: Two times a day (BID) | ORAL | Status: AC
  Administered 2023-02-11 (×2): 20 meq via ORAL
  Filled 2023-02-11 (×2): qty 1

## 2023-02-11 NOTE — Progress Notes (Signed)
7 Days Post-Op   Subjective/Chief Complaint: Reports feeling much better. Reflux resolved. Haing flatus and multiple loose stools.    Objective: Vital signs in last 24 hours: Temp:  [98.2 F (36.8 C)-99 F (37.2 C)] 98.2 F (36.8 C) (09/30 0530) Pulse Rate:  [85-89] 85 (09/30 0530) Resp:  [18-19] 18 (09/30 0530) BP: (136-156)/(78-84) 156/84 (09/30 0530) SpO2:  [93 %-100 %] 97 % (09/30 0530) Last BM Date : 02/11/23  Intake/Output from previous day: 09/29 0701 - 09/30 0700 In: 1436.7 [P.O.:750; I.V.:200; IV Piggyback:486.7] Out: 800 [Urine:800] Intake/Output this shift: No intake/output data recorded.  General appearance: alert and cooperative, on commode GI: soft, non-tender; incision w/ some SS drainage on gauze  Lab Results:  Recent Labs    02/09/23 0839 02/11/23 0417  WBC 12.7* 12.2*  HGB 12.0 10.2*  HCT 38.1 32.0*  PLT 321 296   BMET Recent Labs    02/09/23 0839 02/11/23 0417  NA 141 138  K 3.2* 3.0*  CL 106 107  CO2 24 23  GLUCOSE 116* 108*  BUN 10 13  CREATININE 0.60 0.67  CALCIUM 10.0 8.8*   PT/INR No results for input(s): "LABPROT", "INR" in the last 72 hours. ABG No results for input(s): "PHART", "HCO3" in the last 72 hours.  Invalid input(s): "PCO2", "PO2"  Studies/Results: No results found.  Anti-infectives: Anti-infectives (From admission, onward)    Start     Dose/Rate Route Frequency Ordered Stop   02/08/23 1000  azithromycin (ZITHROMAX) tablet 250 mg       Placed in "Followed by" Linked Group   250 mg Oral Daily 02/07/23 0912 02/11/23 0907   02/07/23 1200  Ampicillin-Sulbactam (UNASYN) 3 g in sodium chloride 0.9 % 100 mL IVPB        3 g 200 mL/hr over 30 Minutes Intravenous Every 6 hours 02/07/23 0946     02/07/23 1000  azithromycin (ZITHROMAX) tablet 500 mg       Placed in "Followed by" Linked Group   500 mg Oral Daily 02/07/23 0912 02/07/23 0932   02/04/23 0930  cefOXitin (MEFOXIN) 2 g in sodium chloride 0.9 % 100 mL IVPB         2 g 200 mL/hr over 30 Minutes Intravenous  Once 02/04/23 0844 02/04/23 1011       Assessment/Plan: SBO  S/p exploratory laparotomy, small bowel resection 9/23 DB POD#7 AFVSS  Surgical pathology: 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  She will need outpatient oncology follow up    - advance to soft diet - start PO pain control - abx for HCAP per primary team -GERD meds - I will check incision once she is back in bed, she reports a lot of SS drainage. - daily lovenox for DVT ppx   LOS: 8 days    Kayla Lowe 02/11/2023

## 2023-02-11 NOTE — Progress Notes (Addendum)
PROGRESS NOTE  Kayla Lowe NWG:[id number] DOB: 08/03/1955 DOA: 02/03/2023 PCP: Etta Grandchild, MD  HPI/Recap of past 24 hours: Kayla Lowe is a 67 y.o. female with medical history significant of GERD, essential hypertension, hyperlipidemia, osteoarthritis, history of total abdominal hysterectomy in 1987 who presents to the ER with nausea and pain is in the right and left upper quadrants.  Associated with diarrhea for last 2 days.  CT abdomen pelvis showed signs of small bowel obstruction.  Seen by general surgery, status post exploratory laparotomy, small bowel resection on 02/04/2023.  Reports multiple bowel movements.  She has been up and walking.  Concern for pneumonia seen on chest x-ray done on 02/06/2023, was started on Unasyn and Z-Pak empirically.  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  The patient will need outpatient oncology follow-up.  02/11/2023: The patient was seen and examined at bedside.  Postop #7.  She is tolerating a diet and having loose stools.  Denies having any significant abdominal pain.  No nausea.    Assessment/Plan: Principal Problem:   SBO (small bowel obstruction) (HCC) Active Problems:   Dyslipidemia, goal LDL below 130   Essential hypertension, benign   Gastroesophageal reflux disease without esophagitis   Stage 3a chronic kidney disease (HCC)  Small bowel obstruction secondary to small bowel mass, POA, status post exploratory laparotomy, on 02/04/2023. 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 The patient will need to follow-up with oncology outpatient. Continue to mobilize as tolerated Continue IV fluid hydration Pain control with p.o. pain medication  Possible marginal zone lymphoma, seen on pathology Medical oncology Dr. Al Pimple consulted  Right lower  lobe pneumonia, POA Continue Unasyn Monitor fever curve and WBCs Maintain O2 saturation above 92%  Hypertension BP is not at goal, elevated. Continue home oral antihypertensive Closely monitor vital signs  Hyperlipidemia Resume home regimen.  Asthma Resume home bronchodilator. Resume home regimen.  Obesity BMI 34 Recommend weight loss outpatient regular physical activity and healthy dieting.  Hypokalemia Serum potassium 3.2 Repleted intravenously. Repeat BMP in the morning Obtain magnesium level.  Physical debility PT OT assessment Fall precautions  Bilateral lower extremity edema, symmetrical Suspect iatrogenic Reduce rate of IV fluid Elevate lower extremities.  Resolved AKI, prerenal in the setting of dehydration Baseline creatinine 0.67 with GFR greater than 60 Presented with creatinine of 1.17 with GFR of 51 Continue to avoid nephrotoxic agents, dehydration, and hypotension.  Time: 55 minutes.  Code Status: Full code  Family Communication: None at bedside  Disposition Plan: Likely will discharge to home once general surgery was consulted.   Consultants: General surgery.  Procedures: Exploratory laparotomy  Antimicrobials: Unasyn, Z-Pak.  DVT prophylaxis: Subcu Lovenox daily  Status is: Inpatient The patient requires at least 2 midnights for further evaluation and treatment of present condition.    Objective: Vitals:   02/10/23 1700 02/10/23 2015 02/11/23 0530 02/11/23 1301  BP: 136/79 (!) 150/78 (!) 156/84 (!) 151/84  Pulse:  87 85 80  Resp: 18 19 18 16   Temp: 99 F (37.2 C) 99 F (37.2 C) 98.2 F (36.8 C) 97.9 F (36.6 C)  TempSrc: Oral  Oral Oral  SpO2: 100% 93% 97% 96%  Weight:      Height:        Intake/Output Summary (Last 24 hours) at 02/11/2023 1629 Last data filed at 02/11/2023 1500 Gross per 24 hour  Intake 4176.64 ml  Output  1400 ml  Net 2776.64 ml   Filed Weights   02/03/23 1351 02/04/23 1335  Weight: 91.5 kg  91.5 kg    Exam:  General: 67 y.o. year-old female well-developed in no acute stress.  She is alert and oriented x 3.   Cardiovascular: Regular rate and rhythm with no rubs or gallops.  No thyromegaly or JVD noted.   Respiratory: Clear to auscultation with no wheezes or rales. Good inspiratory effort. Abdomen: Soft abdominal dressing in place.  Bowel sounds present.. Musculoskeletal: 1+ pitting edema in lower extremities bilaterally.  Skin: Surgical site appears clean without drainage. Psychiatry: Mood is appropriate for condition and setting   Data Reviewed: CBC: Recent Labs  Lab 02/06/23 0430 02/07/23 0604 02/08/23 0436 02/09/23 0839 02/11/23 0417  WBC 6.8 7.4 8.0 12.7* 12.2*  HGB 11.2* 11.1* 10.7* 12.0 10.2*  HCT 35.9* 36.9 34.3* 38.1 32.0*  MCV 97.8 101.4* 98.3 95.3 97.0  PLT 279 268 275 321 296   Basic Metabolic Panel: Recent Labs  Lab 02/06/23 0430 02/07/23 0604 02/08/23 0436 02/09/23 0839 02/11/23 0417  NA 139 136 143 141 138  K 3.4* 3.3* 3.2* 3.2* 3.0*  CL 105 103 108 106 107  CO2 27 26 26 24 23   GLUCOSE 263* 270* 118* 116* 108*  BUN 14 12 12 10 13   CREATININE 0.84 0.66 0.60 0.60 0.67  CALCIUM 9.1 9.2 9.6 10.0 8.8*  MG  --   --  2.3  --  2.2   GFR: Estimated Creatinine Clearance: 72.7 mL/min (by C-G formula based on SCr of 0.67 mg/dL). Liver Function Tests: No results for input(s): "AST", "ALT", "ALKPHOS", "BILITOT", "PROT", "ALBUMIN" in the last 168 hours.  No results for input(s): "LIPASE", "AMYLASE" in the last 168 hours. No results for input(s): "AMMONIA" in the last 168 hours. Coagulation Profile: No results for input(s): "INR", "PROTIME" in the last 168 hours. Cardiac Enzymes: No results for input(s): "CKTOTAL", "CKMB", "CKMBINDEX", "TROPONINI" in the last 168 hours. BNP (last 3 results) No results for input(s): "PROBNP" in the last 8760 hours. HbA1C: No results for input(s): "HGBA1C" in the last 72 hours. CBG: No results for input(s):  "GLUCAP" in the last 168 hours. Lipid Profile: No results for input(s): "CHOL", "HDL", "LDLCALC", "TRIG", "CHOLHDL", "LDLDIRECT" in the last 72 hours. Thyroid Function Tests: No results for input(s): "TSH", "T4TOTAL", "FREET4", "T3FREE", "THYROIDAB" in the last 72 hours. Anemia Panel: No results for input(s): "VITAMINB12", "FOLATE", "FERRITIN", "TIBC", "IRON", "RETICCTPCT" in the last 72 hours. Urine analysis:    Component Value Date/Time   COLORURINE YELLOW 02/04/2023 0025   APPEARANCEUR CLEAR 02/04/2023 0025   LABSPEC 1.020 02/04/2023 0025   PHURINE 6.0 02/04/2023 0025   GLUCOSEU NEGATIVE 02/04/2023 0025   GLUCOSEU NEGATIVE 10/17/2022 0901   HGBUR NEGATIVE 02/04/2023 0025   BILIRUBINUR SMALL (A) 02/04/2023 0025   KETONESUR NEGATIVE 02/04/2023 0025   PROTEINUR NEGATIVE 02/04/2023 0025   UROBILINOGEN 0.2 10/17/2022 0901   NITRITE NEGATIVE 02/04/2023 0025   LEUKOCYTESUR NEGATIVE 02/04/2023 0025   Sepsis Labs: @LABRCNTIP (procalcitonin:4,lacticidven:4)  ) Recent Results (from the past 240 hour(s))  Surgical pcr screen     Status: None   Collection Time: 02/04/23  9:39 AM   Specimen: Nasal Mucosa; Nasal Swab  Result Value Ref Range Status   MRSA, PCR NEGATIVE NEGATIVE Final   Staphylococcus aureus NEGATIVE NEGATIVE Final    Comment: (NOTE) The Xpert SA Assay (FDA approved for NASAL specimens in patients 46 years of age and older), is one component of a  comprehensive surveillance program. It is not intended to diagnose infection nor to guide or monitor treatment. Performed at [facility], [address] [place], [place] [address]   Culture, blood (Routine X 2) w Reflex to ID Panel     Status: None   Collection Time: 02/06/23  3:43 PM   Specimen: BLOOD LEFT ARM  Result Value Ref Range Status   Specimen Description   Final    BLOOD LEFT ARM Performed at [facility], [address], [place], [place] [address]    Special Requests   Final     BOTTLES DRAWN AEROBIC AND ANAEROBIC Blood Culture adequate volume Performed at [facility], [address] [place], [place] [address]    Culture   Final    NO GROWTH 5 DAYS Performed at [facility], [address], [place], [place] [address]    Report Status 02/11/2023 FINAL  Final  Culture, blood (Routine X 2) w Reflex to ID Panel     Status: None   Collection Time: 02/06/23  3:47 PM   Specimen: BLOOD LEFT ARM  Result Value Ref Range Status   Specimen Description   Final    BLOOD LEFT ARM Performed at [facility], [address] [place], [place] [address]    Special Requests   Final    BOTTLES DRAWN AEROBIC AND ANAEROBIC Blood Culture adequate volume Performed at [facility], [address], [place], [place] [address]    Culture   Final    NO GROWTH 5 DAYS Performed at [facility], [address], [place], [place] [address]    Report Status 02/11/2023 FINAL  Final  Expectorated Sputum Assessment w Gram Stain, Rflx to Resp Cult     Status: None   Collection Time: 02/06/23  4:45 PM   Specimen: Expectorated Sputum  Result Value Ref Range Status   Specimen Description EXPECTORATED SPUTUM  Final   Special Requests NONE  Final   Sputum evaluation   Final    THIS SPECIMEN IS ACCEPTABLE FOR SPUTUM CULTURE Performed at [facility], [address], [place], [place] [address]    Report Status 02/06/2023 FINAL  Final  Culture, Respiratory w Gram Stain     Status: None   Collection Time: 02/06/23  4:45 PM  Result Value Ref Range Status   Specimen Description   Final    EXPECTORATED SPUTUM Performed at [facility], [address] [place], [place] [address]    Special Requests   Final    NONE Reflexed from [id number] Performed at [facility], [address], [place], [place] [address]    Gram Stain   Final    ABUNDANT WBC PRESENT, PREDOMINANTLY PMN MODERATE GRAM  POSITIVE COCCI IN CHAINS IN CLUSTERS    Culture   Final    MODERATE Normal respiratory flora-no Staph aureus or Pseudomonas seen Performed at [facility], [address], [place], [place] [address]    Report Status 02/08/2023 FINAL  Final      Studies: No results found.  Scheduled Meds:  acetaminophen  1,000 mg Oral TID   amLODipine  10 mg Oral Daily   aspirin EC  81 mg Oral Daily   carvedilol  6.25 mg Oral BID WC   enoxaparin (LOVENOX) injection  40 mg Subcutaneous Q24H   ezetimibe  10 mg Oral Daily   famotidine  40 mg Oral  QHS   hydrALAZINE  25 mg Oral Q8H   ketorolac  15 mg Intravenous Q6H   lidocaine  2 patch Transdermal Q24H   magic mouthwash  2 mL Oral QID   montelukast  10 mg Oral QHS   pantoprazole  40 mg Oral Daily   potassium chloride  20 mEq Oral BID   pravastatin  40 mg Oral q1800    Continuous Infusions:  ampicillin-sulbactam (UNASYN) IV 3 g (02/11/23 1108)   dextrose 5% lactated ringers 100 mL/hr at 02/11/23 0911   methocarbamol (ROBAXIN) IV 1,000 mg (02/11/23 1611)     LOS: 8 days     Darlin Drop, MD [facility] Pager [phone]  If 7PM-7AM, please contact night-coverage [email or web address] Password [facility] 02/11/2023, 4:29 PM

## 2023-02-12 ENCOUNTER — Telehealth: Payer: Self-pay | Admitting: Hematology and Oncology

## 2023-02-12 DIAGNOSIS — K56609 Unspecified intestinal obstruction, unspecified as to partial versus complete obstruction: Secondary | ICD-10-CM | POA: Diagnosis not present

## 2023-02-12 LAB — COMPREHENSIVE METABOLIC PANEL
ALT: 138 U/L — ABNORMAL HIGH (ref 0–44)
AST: 42 U/L — ABNORMAL HIGH (ref 15–41)
Albumin: 2.4 g/dL — ABNORMAL LOW (ref 3.5–5.0)
Alkaline Phosphatase: 91 U/L (ref 38–126)
Anion gap: 7 (ref 5–15)
BUN: 12 mg/dL (ref 8–23)
CO2: 22 mmol/L (ref 22–32)
Calcium: 8.8 mg/dL — ABNORMAL LOW (ref 8.9–10.3)
Chloride: 106 mmol/L (ref 98–111)
Creatinine, Ser: 0.78 mg/dL (ref 0.44–1.00)
GFR, Estimated: >60 mL/min (ref >60)
Glucose, Bld: 96 mg/dL (ref 70–99)
Potassium: 3.3 mmol/L — ABNORMAL LOW (ref 3.5–5.1)
Sodium: 135 mmol/L (ref 135–145)
Total Bilirubin: 0.8 mg/dL (ref 0.3–1.2)
Total Protein: 5.9 g/dL — ABNORMAL LOW (ref 6.5–8.1)

## 2023-02-12 LAB — CBC
HCT: 33 % — ABNORMAL LOW (ref 36.0–46.0)
Hemoglobin: 10.4 g/dL — ABNORMAL LOW (ref 12.0–15.0)
MCH: 30.1 pg (ref 26.0–34.0)
MCHC: 31.5 g/dL (ref 30.0–36.0)
MCV: 95.7 fL (ref 80.0–100.0)
Platelets: 367 10*3/uL (ref 150–400)
RBC: 3.45 MIL/uL — ABNORMAL LOW (ref 3.87–5.11)
RDW: 14.5 % (ref 11.5–15.5)
WBC: 12.1 10*3/uL — ABNORMAL HIGH (ref 4.0–10.5)
nRBC: 0 % (ref 0.0–0.2)

## 2023-02-12 LAB — PROCALCITONIN: Procalcitonin: 0.1 ng/mL

## 2023-02-12 MED ORDER — IBUPROFEN 400 MG PO TABS
600.0000 mg | ORAL_TABLET | Freq: Three times a day (TID) | ORAL | Status: DC
  Administered 2023-02-12 – 2023-02-15 (×10): 600 mg via ORAL
  Filled 2023-02-12 (×10): qty 1

## 2023-02-12 MED ORDER — TRAMADOL HCL 50 MG PO TABS
50.0000 mg | ORAL_TABLET | Freq: Four times a day (QID) | ORAL | Status: DC | PRN
  Administered 2023-02-12 – 2023-02-15 (×6): 100 mg via ORAL
  Filled 2023-02-12 (×7): qty 2

## 2023-02-12 MED ORDER — METHOCARBAMOL 500 MG PO TABS
1000.0000 mg | ORAL_TABLET | Freq: Four times a day (QID) | ORAL | Status: DC
  Administered 2023-02-12 – 2023-02-15 (×13): 1000 mg via ORAL
  Filled 2023-02-12 (×13): qty 2

## 2023-02-12 MED ORDER — POTASSIUM CHLORIDE 10 MEQ/100ML IV SOLN
10.0000 meq | INTRAVENOUS | Status: AC
  Administered 2023-02-12 (×3): 10 meq via INTRAVENOUS
  Filled 2023-02-12 (×3): qty 100

## 2023-02-12 MED ORDER — ACETAMINOPHEN 500 MG PO TABS
1000.0000 mg | ORAL_TABLET | Freq: Four times a day (QID) | ORAL | Status: DC
  Administered 2023-02-12 – 2023-02-15 (×11): 1000 mg via ORAL
  Filled 2023-02-12 (×12): qty 2

## 2023-02-12 MED ORDER — POLYETHYLENE GLYCOL 3350 17 G PO PACK: 17.0000 g | PACK | Freq: Every day | ORAL | Status: DC | PRN

## 2023-02-12 NOTE — Evaluation (Signed)
Occupational Therapy Evaluation Patient Details Name: Kayla Lowe MRN: [id number] DOB: [birth year] Today's Date: 02/12/2023   History of Present Illness Patient is a 67 year old female who presented on 9/22 with pain in right and left upper quadrants nausea and diarrhea. Patient was found to have small bowel obstruction. Patient underwent ex lap and small bowel resection on 9/23. 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 PMH: asthma, obesity, hypokalemia, HTN   Clinical Impression   Patient is a 67 year old female who was admitted for above. 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 Patient currently is limited by pain in abdomen, decreased functional activity tolerance and increased A to complete LB ADLs. Patient will benefit from continued inpatient follow up therapy, <3 hours/day. Patient would continue to benefit from skilled OT services at this time while admitted and after d/c to address noted deficits in order to improve overall safety and independence in ADLs.        If plan is discharge home, recommend the following: Assistance with cooking/housework;Assist for transportation;Help with stairs or ramp for entrance    Functional Status Assessment  Patient has had a recent decline in their functional status and demonstrates the ability to make significant improvements in function in a reasonable and predictable amount of time.  Equipment Recommendations  Other (comment) (total hip kit)       Precautions / Restrictions Precautions Precaution Comments: abdominal Required Braces or Orthoses: Other Brace Other Brace: ABD binder Restrictions Weight Bearing Restrictions: No      Mobility Bed Mobility               General bed mobility comments: patient was up in recliner and returned to the same            Balance Overall balance assessment: Mild deficits observed, not formally tested            ADL either performed or assessed with clinical judgement   ADL Overall ADL's : Needs assistance/impaired Eating/Feeding: Modified independent;Sitting   Grooming: Wash/dry face;Wash/dry hands;Standing;Supervision/safety Grooming Details (indicate cue type and reason): at sink in bathroom with no AD Upper Body Bathing: Set up;Sitting   Lower Body Bathing: Maximal assistance Lower Body Bathing Details (indicate cue type and reason): education provided on long handled sponge. Upper Body Dressing : Set up;Sitting   Lower Body Dressing: Maximal assistance;Sitting/lateral leans Lower Body Dressing Details (indicate cue type and reason): unable to complete figure four. patient eduacted on using total hip kit. Toilet Transfer: Supervision/safety;Ambulation Toilet Transfer Details (indicate cue type and reason): no AD Toileting- Clothing Manipulation and Hygiene: Contact guard assist;Sit to/from stand       Functional mobility during ADLs: Supervision/safety General ADL Comments: patient was educated on using AE for LB dressing with patient able to demonstate understanding.patient verbalized understanding. patient reported that she had a tub shower at home in a small bathroom. patient was educated on shower seats and how typically they are not covered like AE is not as well. patient reported her daughter would buy her whatever she needed.     Vision   Vision Assessment?: No apparent visual deficits            Pertinent Vitals/Pain Pain Assessment Pain Assessment: Faces Faces Pain Scale: Hurts even more Pain Location: abdomen Pain Descriptors / Indicators: Grimacing, Discomfort Pain Intervention(s): Monitored during session, Repositioned     Extremity/Trunk Assessment Upper Extremity Assessment Upper Extremity Assessment: Overall WFL for tasks  assessed   Lower Extremity Assessment Lower Extremity Assessment: Overall WFL for tasks assessed   Cervical / Trunk  Assessment Cervical / Trunk Assessment: Normal   Communication Communication Communication: No apparent difficulties   Cognition Arousal: Alert Behavior During Therapy: WFL for tasks assessed/performed Overall Cognitive Status: Within Functional Limits for tasks assessed                  Home Living Family/patient expects to be discharged to:: Private residence Living Arrangements: Alone Available Help at Discharge: Family;Available PRN/intermittently Type of Home: House Home Access: Stairs to enter [employer] of Steps: 4 short steps along path to get to apartment   Home Layout: One level     Bathroom Shower/Tub: Tub/shower unit         Home Equipment: None          Prior Functioning/Environment Prior Level of Function : Independent/Modified Independent               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        OT Problem List: Decreased activity tolerance;Impaired balance (sitting and/or standing);Decreased coordination;Decreased safety awareness;Decreased knowledge of precautions      OT Treatment/Interventions: Self-care/ADL training;Therapeutic exercise;DME and/or AE instruction;Therapeutic activities;Patient/family education    OT Goals(Current goals can be found in the care plan section) Acute Rehab OT Goals Patient Stated Goal: to get stronger to complete IADLs OT Goal Formulation: With patient Time For Goal Achievement: 02/26/23 Potential to Achieve Goals: Fair  OT Frequency: Min 1X/week       AM-PAC OT "6 Clicks" Daily Activity     Outcome Measure Help from another person eating meals?: None Help from another person taking care of personal grooming?: A Little Help from another person toileting, which includes using toliet, bedpan, or urinal?: A Little Help from another person bathing (including washing, rinsing, drying)?: A Little Help from another person to put on and taking off regular upper body clothing?:  A Little Help from another person to put on and taking off regular lower body clothing?: A Lot 6 Click Score: 18   End of Session    Activity Tolerance: Patient tolerated treatment well Patient left: in chair;with call bell/phone within reach  OT Visit Diagnosis: Unsteadiness on feet (R26.81)                Time: 9147-8295 OT Time Calculation (min): 29 min Charges:  OT General Charges $OT Visit: 1 Visit OT Evaluation $OT Eval Low Complexity: 1 Low OT Treatments $Self Care/Home Management : 8-22 mins  Rosalio Loud, MS Acute Rehabilitation Department Office# [phone]   Selinda Flavin 02/12/2023, 12:44 PM

## 2023-02-12 NOTE — Progress Notes (Signed)
PROGRESS NOTE  Kayla Lowe KVQ:[id number] DOB: 1956/04/04 DOA: 02/03/2023 PCP: Etta Grandchild, MD  HPI/Recap of past 24 hours: Kayla Lowe is a 67 y.o. female with medical history significant of GERD, essential hypertension, hyperlipidemia, osteoarthritis, history of total abdominal hysterectomy in 1987 who presents to the ER with nausea and pain is in the right and left upper quadrants.  Associated with diarrhea for last 2 days.  CT abdomen pelvis showed signs of small bowel obstruction.  Seen by general surgery, status post exploratory laparotomy, small bowel resection on 02/04/2023.  Reports multiple bowel movements.  She has been up and walking.  Concern for pneumonia seen on chest x-ray done on 02/06/2023, was started on Unasyn and Z-Pak empirically.  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  Seen by medical oncology.  General surgery following.  02/12/23: Seen and examined at bedside.  Endorses intermittent abdominal pain, improved with current pain regimen.   Assessment/Plan: Principal Problem:   SBO (small bowel obstruction) (HCC) Active Problems:   Dyslipidemia, goal LDL below 130   Essential hypertension, benign   Gastroesophageal reflux disease without esophagitis   Stage 3a chronic kidney disease (HCC)  Small bowel obstruction secondary to small bowel mass, POA, status post exploratory laparotomy, on 02/04/2023. 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 Seen by medical oncology, will need further staging and surveillance. Continue to mobilize as tolerated Continue gentle IV fluid hydration As needed analgesics  Newly diagnosed B-cell lymphoma/marginal zone lymphoma, seen on pathology Seen by medical oncology Dr. Al Pimple Appreciate oncology's recommendations  Right lower lobe  pneumonia, POA Continue Unasyn Monitor fever curve and WBCs Maintain O2 saturation above 92%  Hypertension BP is not at goal, elevated. Continue home oral antihypertensive Closely monitor vital signs  Hyperlipidemia Continue home regimen.  Asthma Continue home bronchodilator. Continue home regimen.  Obesity BMI 34 Recommend weight loss outpatient regular physical activity and healthy dieting.  Refractory hypokalemia Repleted intravenously.  Physical debility Continue PT OT Fall precautions  Bilateral lower extremity edema, symmetrical Suspect iatrogenic Reduce rate of IV fluid Elevate lower extremities.  Resolved AKI, prerenal in the setting of dehydration Baseline creatinine 0.67 with GFR greater than 60 Presented with creatinine of 1.17 with GFR of 51 Continue to avoid nephrotoxic agents, dehydration, and hypotension.  Time: 57 minutes.  Code Status: Full code  Family Communication: Updated her friend and roommate at bedside, with the patient's consent.  Disposition Plan: Likely will discharge to home once general surgery signs off.   Consultants: General surgery. Medical oncology  Procedures: Exploratory laparotomy  Antimicrobials: Unasyn, Z-Pak.  DVT prophylaxis: Subcu Lovenox daily  Status is: Inpatient The patient requires at least 2 midnights for further evaluation and treatment of present condition.    Objective: Vitals:   02/11/23 1301 02/11/23 2021 02/12/23 0606 02/12/23 1412  BP: (!) 151/84 (!) 160/82 (!) 140/72 (!) 156/77  Pulse: 80 88 79 81  Resp: 16 18 16 18   Temp: 97.9 F (36.6 C) 98.9 F (37.2 C) 98.4 F (36.9 C) 97.9 F (36.6 C)  TempSrc: Oral Oral Oral Oral  SpO2: 96% 98% 98% 92%  Weight:      Height:        Intake/Output Summary (Last 24 hours) at 02/12/2023 1834 Last data filed at 02/12/2023 1800 Gross per 24 hour  Intake 2691.55 ml  Output 1200 ml  Net 1491.55 ml   [employer]  02/03/23 1351 02/04/23 1335   Weight: 91.5 kg 91.5 kg    Exam:  General: 67 y.o. year-old female well-developed well-nourished in no acute distress.  She is alert and oriented x 3.   Cardiovascular: Regular rate and rhythm with no rubs or gallops.  No thyromegaly or JVD noted.   Respiratory: Clear to auscultation with no wheezes or rales. Good inspiratory effort. Abdomen: Soft.  Abdominal dressing changed today by surgery.  Bowel sounds present.. Musculoskeletal: 1+ pitting edema in lower extremities bilaterally.  Skin: Surgical site appears clean without drainage. Psychiatry: Mood is appropriate for condition and setting.   Data Reviewed: CBC: Recent Labs  Lab 02/07/23 0604 02/08/23 0436 02/09/23 0839 02/11/23 0417 02/12/23 0447  WBC 7.4 8.0 12.7* 12.2* 12.1*  HGB 11.1* 10.7* 12.0 10.2* 10.4*  HCT 36.9 34.3* 38.1 32.0* 33.0*  MCV 101.4* 98.3 95.3 97.0 95.7  PLT 268 275 321 296 367   Basic Metabolic Panel: Recent Labs  Lab 02/07/23 0604 02/08/23 0436 02/09/23 0839 02/11/23 0417 02/12/23 0447  NA 136 143 141 138 135  K 3.3* 3.2* 3.2* 3.0* 3.3*  CL 103 108 106 107 106  CO2 26 26 24 23 22   GLUCOSE 270* 118* 116* 108* 96  BUN 12 12 10 13 12   CREATININE 0.66 0.60 0.60 0.67 0.78  CALCIUM 9.2 9.6 10.0 8.8* 8.8*  MG  --  2.3  --  2.2  --    GFR: Estimated Creatinine Clearance: 72.7 mL/min (by C-G formula based on SCr of 0.78 mg/dL). Liver Function Tests: Recent Labs  Lab 02/12/23 0447  AST 42*  ALT 138*  ALKPHOS 91  BILITOT 0.8  PROT 5.9*  ALBUMIN 2.4*    No results for input(s): "LIPASE", "AMYLASE" in the last 168 hours. No results for input(s): "AMMONIA" in the last 168 hours. Coagulation Profile: No results for input(s): "INR", "PROTIME" in the last 168 hours. Cardiac Enzymes: No results for input(s): "CKTOTAL", "CKMB", "CKMBINDEX", "TROPONINI" in the last 168 hours. BNP (last 3 results) No results for input(s): "PROBNP" in the last 8760 hours. HbA1C: No results for input(s):  "HGBA1C" in the last 72 hours. CBG: No results for input(s): "GLUCAP" in the last 168 hours. Lipid Profile: No results for input(s): "CHOL", "HDL", "LDLCALC", "TRIG", "CHOLHDL", "LDLDIRECT" in the last 72 hours. Thyroid Function Tests: No results for input(s): "TSH", "T4TOTAL", "FREET4", "T3FREE", "THYROIDAB" in the last 72 hours. Anemia Panel: No results for input(s): "VITAMINB12", "FOLATE", "FERRITIN", "TIBC", "IRON", "RETICCTPCT" in the last 72 hours. Urine analysis:    Component Value Date/Time   COLORURINE YELLOW 02/04/2023 0025   APPEARANCEUR CLEAR 02/04/2023 0025   LABSPEC 1.020 02/04/2023 0025   PHURINE 6.0 02/04/2023 0025   GLUCOSEU NEGATIVE 02/04/2023 0025   GLUCOSEU NEGATIVE 10/17/2022 0901   HGBUR NEGATIVE 02/04/2023 0025   BILIRUBINUR SMALL (A) 02/04/2023 0025   KETONESUR NEGATIVE 02/04/2023 0025   PROTEINUR NEGATIVE 02/04/2023 0025   UROBILINOGEN 0.2 10/17/2022 0901   NITRITE NEGATIVE 02/04/2023 0025   LEUKOCYTESUR NEGATIVE 02/04/2023 0025   Sepsis Labs: @LABRCNTIP (procalcitonin:4,lacticidven:4)  ) Recent Results (from the past 240 hour(s))  Surgical pcr screen     Status: None   Collection Time: 02/04/23  9:39 AM   Specimen: Nasal Mucosa; Nasal Swab  Result Value Ref Range Status   MRSA, PCR NEGATIVE NEGATIVE Final   Staphylococcus aureus NEGATIVE NEGATIVE Final    Comment: (NOTE) The Xpert SA Assay (FDA approved for NASAL specimens in patients 59 years of age and older), is one  component of a comprehensive surveillance program. It is not intended to diagnose infection nor to guide or monitor treatment. Performed at [facility], [address], [place], [place] [address]   Culture, blood (Routine X 2) w Reflex to ID Panel     Status: None   Collection Time: 02/06/23  3:43 PM   Specimen: BLOOD LEFT ARM  Result Value Ref Range Status   Specimen Description   Final    BLOOD LEFT ARM Performed at [facility], [address] [place], [place] [address]    Special Requests   Final    BOTTLES DRAWN AEROBIC AND ANAEROBIC Blood Culture adequate volume Performed at [facility], [address], [place], [place] [address]    Culture   Final    NO GROWTH 5 DAYS Performed at [facility], [address], [place], [place] [address]    Report Status 02/11/2023 FINAL  Final  Culture, blood (Routine X 2) w Reflex to ID Panel     Status: None   Collection Time: 02/06/23  3:47 PM   Specimen: BLOOD LEFT ARM  Result Value Ref Range Status   Specimen Description   Final    BLOOD LEFT ARM Performed at [facility], [address], [place], [place] [address]    Special Requests   Final    BOTTLES DRAWN AEROBIC AND ANAEROBIC Blood Culture adequate volume Performed at [facility], [address] [place], [place] [address]    Culture   Final    NO GROWTH 5 DAYS Performed at [facility], [address] [place], [place] [address]    Report Status 02/11/2023 FINAL  Final  Expectorated Sputum Assessment w Gram Stain, Rflx to Resp Cult     Status: None   Collection Time: 02/06/23  4:45 PM   Specimen: Expectorated Sputum  Result Value Ref Range Status   Specimen Description EXPECTORATED SPUTUM  Final   Special Requests NONE  Final   Sputum evaluation   Final    THIS SPECIMEN IS ACCEPTABLE FOR SPUTUM CULTURE Performed at [facility], [address], [place], [place] [address]    Report Status 02/06/2023 FINAL  Final  Culture, Respiratory w Gram Stain     Status: None   Collection Time: 02/06/23  4:45 PM  Result Value Ref Range Status   Specimen Description   Final    EXPECTORATED SPUTUM Performed at [facility], [address], [place], [place] [address]    Special Requests   Final    NONE Reflexed from [id number] Performed at [facility], [address], [place], [place] [address]    Gram Stain   Final     ABUNDANT WBC PRESENT, PREDOMINANTLY PMN MODERATE GRAM POSITIVE COCCI IN CHAINS IN CLUSTERS    Culture   Final    MODERATE Normal respiratory flora-no Staph aureus or Pseudomonas seen Performed at [facility], [address], [place], [place] [address]    Report Status 02/08/2023 FINAL  Final      Studies: No results found.  Scheduled Meds:  acetaminophen  1,000 mg Oral QID   amLODipine  10 mg Oral Daily   aspirin EC  81 mg Oral Daily   carvedilol  6.25 mg Oral BID WC   enoxaparin (LOVENOX) injection  40 mg Subcutaneous Q24H   ezetimibe  10 mg Oral Daily   famotidine  40 mg Oral QHS   hydrALAZINE  25 mg Oral Q8H   ibuprofen  600 mg Oral TID   lidocaine  2 patch Transdermal Q24H   methocarbamol  1,000 mg Oral QID   montelukast  10 mg Oral QHS   pantoprazole  40 mg Oral Daily   pravastatin  40 mg Oral q1800    Continuous Infusions:  ampicillin-sulbactam (UNASYN) IV 3 g (02/12/23 1814)   dextrose 5% lactated ringers 50 mL/hr at 02/11/23 1703     LOS: 9 days     Darlin Drop, MD [facility] Pager [phone]  If 7PM-7AM, please contact night-coverage [email or web address] Password [facility] 02/12/2023, 6:34 PM

## 2023-02-12 NOTE — Consult Note (Signed)
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 She reports a history of abdominal pain and acid reflux, which she initially attributed to food poisoning. The pain, described as a burning sensation across the abdomen, has been ongoing for approximately six weeks. She also reports episodes of night sweats, severe enough to require changing clothes, for the past six to seven months. She denies any weight loss, fevers, or other systemic symptoms. The patient has no personal history of cancer, but reports a family history of breast cancer in a paternal aunt.  Past Medical History:  Diagnosis Date   GERD (gastroesophageal reflux disease)    Hyperlipidemia    Hypertension    Osteoarthritis   :   Past Surgical History:  Procedure Laterality Date   ABDOMINAL HYSTERECTOMY  1987   LAPAROTOMY N/A 02/04/2023   Procedure: EXPLORATORY LAPAROTOMY, WITH SMALL BOWEL RESECTION;  Surgeon: Abigail Miyamoto, MD;  Location: [facility];  Service: General;  Laterality: N/A;  :   Current Facility-Administered Medications  Medication Dose Route Frequency Provider Last Rate Last Admin   acetaminophen (TYLENOL) tablet 1,000 mg  1,000 mg Oral QID Simaan, Elizabeth S, PA-C       amLODipine (NORVASC) tablet 10 mg  10 mg Oral Daily Arnetha Courser, MD   10 mg at 02/12/23 0910   Ampicillin-Sulbactam (UNASYN) 3 g in sodium chloride 0.9 % 100 mL IVPB  3 g Intravenous Q6H Amin, Tilman Neat, MD 200 mL/hr at 02/12/23 1132 3 g at 02/12/23 1132   aspirin EC tablet 81 mg  81 mg Oral Daily Arnetha Courser, MD   81 mg at 02/12/23 0910   carvedilol (COREG) tablet 6.25 mg  6.25 mg  Oral BID WC Arnetha Courser, MD   6.25 mg at 02/12/23 1644   dextrose 5 % in lactated ringers infusion   Intravenous Continuous Dow Adolph N, DO 50 mL/hr at 02/11/23 1703 New Bag at 02/11/23 1703   enoxaparin (LOVENOX) injection 40 mg  40 mg Subcutaneous Q24H Adam Phenix, PA-C   40 mg at 02/12/23 1136   ezetimibe (ZETIA) tablet 10 mg  10 mg Oral Daily Arnetha Courser, MD   10 mg at 02/12/23 0909   famotidine (PEPCID) tablet 40 mg  40 mg Oral QHS Arnetha Courser, MD   40 mg at 02/11/23 2113   hydrALAZINE (APRESOLINE) injection 5 mg  5 mg Intravenous Q6H PRN Anthoney Harada, NP   5 mg at 02/10/23 0013   hydrALAZINE (APRESOLINE) tablet 25 mg  25 mg Oral Q8H Amin, Tilman Neat, MD   25 mg at 02/12/23 1430   ibuprofen (ADVIL) tablet 600 mg  600 mg Oral TID Adam Phenix, PA-C   600 mg at 02/12/23 1644   lidocaine (LIDODERM) 5 % 2 patch  2 patch Transdermal Q24H Adam Phenix, PA-C   2 patch at 02/12/23 0940   methocarbamol (ROBAXIN) tablet 1,000 mg  1,000 mg Oral QID Adam Phenix, PA-C   1,000 mg at 02/12/23 1135   montelukast (SINGULAIR) tablet 10 mg  10 mg Oral QHS Arnetha Courser, MD   10 mg at 02/11/23 2113   morphine (PF) 2 MG/ML  injection 2-4 mg  2-4 mg Intravenous Q2H PRN Juliet Rude, PA-C   2 mg at 02/12/23 1435   ondansetron (ZOFRAN) tablet 4 mg  4 mg Oral Q6H PRN Juliet Rude, PA-C       Or   ondansetron [facility]) injection 4 mg  4 mg Intravenous Q6H PRN Trixie Deis R, PA-C   4 mg at 02/10/23 0013   pantoprazole (PROTONIX) EC tablet 40 mg  40 mg Oral Daily Arnetha Courser, MD   40 mg at 02/12/23 0910   phenol (CHLORASEPTIC) mouth spray 1 spray  1 spray Mouth/Throat PRN Lanae Boast, MD   1 spray at 02/04/23 2051   pravastatin (PRAVACHOL) tablet 40 mg  40 mg Oral q1800 Arnetha Courser, MD   40 mg at 02/11/23 1610   traMADol (ULTRAM) tablet 50-100 mg  50-100 mg Oral Q6H PRN Adam Phenix, PA-C   100 mg at 02/12/23 1645      Allergies  Allergen Reactions   Cozaar  [Losartan Potassium]     Caused cough and sweats. Tingling in legs.   Lisinopril Cough   Statins Other (See Comments)    myalgias  :   Family History  Problem Relation Age of Onset   Cancer Father    Hypertension Brother    Diabetes Brother    Diabetes Paternal Grandfather    Diabetes Paternal Uncle    Colon cancer Neg Hx    Hypercalcemia Neg Hx   :   Social History   Socioeconomic History   Marital status: Legally Separated    Spouse name: Not on file   Number of children: Not on file   Years of education: Not on file   Highest education level: Not on file  Occupational History   Not on file  Tobacco Use   Smoking status: Never   Smokeless tobacco: Never  Substance and Sexual Activity   Alcohol use: No   Drug use: No   Sexual activity: Not Currently  Other Topics Concern   Not on file  Social History Narrative   Not on file   Social Determinants of Health   Financial Resource Strain: Low Risk  (03/22/2022)   Overall Financial Resource Strain (CARDIA)    Difficulty of Paying Living Expenses: Not hard at all  Food Insecurity: No Food Insecurity (02/03/2023)   Hunger Vital Sign    Worried About Running Out of Food in the Last Year: Never true    Ran Out of Food in the Last Year: Never true  Transportation Needs: No Transportation Needs (02/03/2023)   PRAPARE - Administrator, Civil Service (Medical): No    Lack of Transportation (Non-Medical): No  Physical Activity: Inactive (03/22/2022)   Exercise Vital Sign    Days of Exercise per Week: 0 days    Minutes of Exercise per Session: 0 min  Stress: No Stress Concern Present (03/22/2022)   [employer] of Occupational Health - Occupational Stress Questionnaire    Feeling of Stress : Not at all  Social Connections: Moderately Integrated (03/22/2022)   Social Connection and Isolation Panel [NHANES]    Frequency of Communication with Friends and Family: More than three times a week    Frequency of  Social Gatherings with Friends and Family: More than three times a week    Attends Religious Services: More than 4 times per year    Active Member of [employer] or Organizations: No    Attends Engineer, structural: More than  4 times per year    Marital Status: Separated  Intimate Partner Violence: Not At Risk (02/03/2023)   Humiliation, Afraid, Rape, and Kick questionnaire    Fear of Current or Ex-Partner: No    Emotionally Abused: No    Physically Abused: No    Sexually Abused: No  :  Pertinent items are noted in HPI.  Exam: Patient Vitals for the past 24 hrs:  BP Temp Temp src Pulse Resp SpO2  02/12/23 1412 (!) 156/77 97.9 F (36.6 C) Oral 81 18 92 %  02/12/23 0606 (!) 140/72 98.4 F (36.9 C) Oral 79 16 98 %  02/11/23 2021 (!) 160/82 98.9 F (37.2 C) Oral 88 18 98 %    Physical Exam Constitutional:      Appearance: She is well-developed.  HENT:     Head: Normocephalic.  Cardiovascular:     Rate and Rhythm: Normal rate and regular rhythm.     Pulses: Normal pulses.     Heart sounds: Normal heart sounds.  Pulmonary:     Effort: Pulmonary effort is normal.     Breath sounds: Normal breath sounds.  Abdominal:     General: Abdomen is flat.     Palpations: Abdomen is soft.  Musculoskeletal:        General: No swelling.     Cervical back: Normal range of motion. No rigidity.  Lymphadenopathy:     Cervical: No cervical adenopathy.  Skin:    General: Skin is warm and dry.  Neurological:     General: No focal deficit present.     Mental Status: She is alert.  Psychiatric:        Mood and Affect: Mood normal.       Lab Results  Component Value Date   WBC 12.1 (H) 02/12/2023   HGB 10.4 (L) 02/12/2023   HCT 33.0 (L) 02/12/2023   PLT 367 02/12/2023   GLUCOSE 96 02/12/2023   CHOL 187 04/18/2022   TRIG 127.0 04/18/2022   HDL 54.00 04/18/2022   LDLDIRECT 220.0 09/16/2019   LDLCALC 108 (H) 04/18/2022   ALT 138 (H) 02/12/2023   AST 42 (H) 02/12/2023   NA 135  02/12/2023   K 3.3 (L) 02/12/2023   CL 106 02/12/2023   CREATININE 0.78 02/12/2023   BUN 12 02/12/2023   CO2 22 02/12/2023    DG Abd 1 View  Result Date: 02/09/2023 CLINICAL DATA:  Abdominal distension.  Abdominal pain.  Diarrhea. EXAM: ABDOMEN - 1 VIEW COMPARISON:  02/06/2023 FINDINGS: No nasogastric tube is visible. Moderate amount of air present in the stomach. Dilated loops of small intestine are present which could be due to ileus or partial small bowel obstruction. Colon pattern is normal. IMPRESSION: 1. No nasogastric tube visible. 2. Dilated loops of small intestine that could be due to ileus or partial small bowel obstruction. Similar appearance to the study of 3 days ago. Electronically Signed   By: Paulina Fusi M.D.   On: 02/09/2023 13:19   DG CHEST PORT 1 VIEW  Result Date: 02/06/2023 CLINICAL DATA:  Fever abdominal pain EXAM: PORTABLE CHEST 1 VIEW COMPARISON:  03/31/2020 FINDINGS: Esophageal tube tip below the diaphragm and curled at the fundus of stomach. Low lung volumes with atelectasis or pneumonia at the right base. Mild cardiomegaly with aortic atherosclerosis. IMPRESSION: Low lung volumes with atelectasis or pneumonia at the right base. Electronically Signed   By: Jasmine Pang M.D.   On: 02/06/2023 22:01   DG Abd Portable 1V  Result Date: 02/06/2023 CLINICAL DATA:  Readjustment of NG tube. EXAM: PORTABLE ABDOMEN - 1 VIEW COMPARISON:  Earlier same day FINDINGS: Interval advancement of enteric tube with tip and side port now projecting over the expected location of the gastric fundus. Otherwise, stable examination. IMPRESSION: 1. Interval advancement of enteric tube with tip and side port now projecting over the expected location of the gastric fundus. 2. Otherwise, stable examination, as described on abdominal radiograph performed earlier same day. Electronically Signed   By: Simonne Come M.D.   On: 02/06/2023 11:50   DG Abd Portable 1V  Result Date: 02/06/2023 CLINICAL DATA:   Confirm NG tube placement. Postoperative nausea and vomiting. EXAM: PORTABLE ABDOMEN - 1 VIEW COMPARISON:  Earlier same day; 02/04/2023 FINDINGS: Interval retraction enteric tube with tip and side port projected over the distal esophagus. Advancement at least 17 cm is advised. Redemonstrated moderate-to-marked gaseous distention of multiple loops of small bowel with index loop of small bowel within the right mid abdomen measuring 5.3 cm in diameter. Midline abdominal staples. Limited visualization of the lower thorax demonstrates trace right-sided pleural effusion with bibasilar heterogeneous/consolidative opacities, right-greater-than-left. No acute osseous abnormalities. Mild scoliotic curvature of the thoracolumbar spine with associated multilevel DDD, incompletely evaluated. IMPRESSION: 1. Interval retraction enteric tube with tip and side port projected over the distal esophagus. Advancement at least 17 cm is advised. 2. Redemonstrated moderate-to-marked gaseous distention of multiple loops of small bowel, nonspecific though worrisome for small bowel obstruction. 3. Trace right-sided pleural effusion with bibasilar heterogeneous/consolidative opacities, right-greater-than-left, atelectasis versus infiltrate. Electronically Signed   By: Simonne Come M.D.   On: 02/06/2023 11:49   DG Abd Portable 1V-Small Bowel Obstruction Protocol-initial, 8 hr delay  Result Date: 02/04/2023 CLINICAL DATA:  Follow-up small bowel obstruction. 8 hour delay imaging. EXAM: PORTABLE ABDOMEN - 1 VIEW COMPARISON:  02/03/2023 . FINDINGS: there is an enteric tube with tip and side port in the stomach. Persistent dilated small bowel loops are identified which measure up to 3.8 cm. Gas and stool noted within normal caliber colon. IMPRESSION: Persistent small bowel obstruction pattern. Electronically Signed   By: Signa Kell M.D.   On: 02/04/2023 05:43   DG Abd Portable 1V-Small Bowel Protocol-Position Verification  Result Date:  02/03/2023 CLINICAL DATA:  Enteric tube placement EXAM: PORTABLE ABDOMEN - 1 VIEW COMPARISON:  Same day CT abdomen and pelvis FINDINGS: Gastric/enteric tube tip projects over the stomach. Partially imaged dilated small bowel loops in the right hemiabdomen. IMPRESSION: Gastric/enteric tube tip projects over the stomach. Electronically Signed   By: Agustin Cree M.D.   On: 02/03/2023 19:27   CT ABDOMEN PELVIS W CONTRAST  Result Date: 02/03/2023 CLINICAL DATA:  Bilateral upper quadrant pain. Nausea, vomiting, and diarrhea for 2 days. EXAM: CT ABDOMEN AND PELVIS WITH CONTRAST TECHNIQUE: Multidetector CT imaging of the abdomen and pelvis was performed using the standard protocol following bolus administration of intravenous contrast. RADIATION DOSE REDUCTION: This exam was performed according to the departmental dose-optimization program which includes automated exposure control, adjustment of the mA and/or kV according to patient size and/or use of iterative reconstruction technique. CONTRAST:  OMNIPAQUE IOHEXOL 300 MG/ML  SOLN COMPARISON:  07/10/2013 FINDINGS: Lower Chest: No acute findings. Hepatobiliary: No suspicious hepatic masses identified. Gallbladder is unremarkable. No evidence of biliary ductal dilatation. Pancreas:  No mass or inflammatory changes. Spleen: Within normal limits in size and appearance. Adrenals/Urinary Tract: No suspicious masses identified. No evidence of ureteral calculi or hydronephrosis. Stomach/Bowel: Multiple moderately dilated small bowel  loops are seen. A transition point is seen in the central small bowel mesentery which shows tethering, suspicious for adhesion. A markedly dilated small bowel loop is seen in the left abdomen which shows wall thickening and is suspicious for a closed loop obstruction. No evidence of pneumatosis, free intraperitoneal air, or abscess. Normal appendix visualized. Diverticulosis is seen mainly involving the descending and sigmoid colon, however there  is no evidence of diverticulitis. Vascular/Lymphatic: No pathologically enlarged lymph nodes. No acute vascular findings. Reproductive: Prior hysterectomy noted. Adnexal regions are unremarkable in appearance. Tiny amount of free fluid noted in pelvic cul-de-sac. Other:  None. Musculoskeletal:  No suspicious bone lesions identified. IMPRESSION: Small bowel obstruction, with transition point in central small bowel mesentery suspicious for adhesion. Markedly dilated small bowel loop in left abdomen mild wall thickening, raising suspicion for closed loop obstruction. Surgical consultation is recommended. No evidence of pneumatosis, free air, or abscess. Colonic diverticulosis, without radiographic evidence of diverticulitis. Electronically Signed   By: Danae Orleans M.D.   On: 02/03/2023 17:36   MM 3D SCREENING MAMMOGRAM BILATERAL BREAST  Result Date: 01/24/2023 CLINICAL DATA:  Screening. EXAM: DIGITAL SCREENING BILATERAL MAMMOGRAM WITH TOMOSYNTHESIS AND CAD TECHNIQUE: Bilateral screening digital craniocaudal and mediolateral oblique mammograms were obtained. Bilateral screening digital breast tomosynthesis was performed. The images were evaluated with computer-aided detection. COMPARISON:  Previous exam(s). ACR Breast Density Category b: There are scattered areas of fibroglandular density. FINDINGS: There are no findings suspicious for malignancy. IMPRESSION: No mammographic evidence of malignancy. A result letter of this screening mammogram will be mailed directly to the patient. RECOMMENDATION: Screening mammogram in one year. (Code:SM-B-01Y) BI-RADS CATEGORY  1: Negative. Electronically Signed   By: Frederico Hamman M.D.   On: 01/24/2023 14:51    Pathology:   FINAL MICROSCOPIC DIAGNOSIS:  A. 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 -Lateral margins are negative for lymphoma; however lymphoid cells are present at the  radial margin.  Note: The mass consists of sheets of small lymphocytes with mature/clumped chromatin.  Some have a vaguely monocytoid appearance. While there is distortion of the normal mucosa prominent lymphoepithelial lesions are not identified.  The cells are B cells (CD20 positive) and are negative for that T-cell markers (CD3, CD4, CD5, CD8 and CD43).  The cells of interest are negative for the germinal center markers BCL6 and CD10 which do highlight a few scattered residual overrun germinal centers..  Bcl-2 is coexpressed.  Cyclin D1 is negative as is CD23, which highlights focal residual follicular dendritic cell meshworks.  The proliferation rate by Ki-67 is low (less than 5%) the findings are consistent with a CD5/CD10 negative B-cell lymphoma and most consistent with marginal zone lymphoma.  If there is no evidence of lymphadenopathy or splenomegaly is most likely represents marginal zone lymphoma mucosa associated lymphoid tissue.  Clinical correlation recommended.   DG Abd 1 View  Result Date: 02/09/2023 CLINICAL DATA:  Abdominal distension.  Abdominal pain.  Diarrhea. EXAM: ABDOMEN - 1 VIEW COMPARISON:  02/06/2023 FINDINGS: No nasogastric tube is visible. Moderate amount of air present in the stomach. Dilated loops of small intestine are present which could be due to ileus or partial small bowel obstruction. Colon pattern is normal. IMPRESSION: 1. No nasogastric tube visible. 2. Dilated loops of small intestine that could be due to ileus or partial small bowel obstruction. Similar appearance to the study of 3 days ago. Electronically Signed   By: Paulina Fusi  M.D.   On: 02/09/2023 13:19   DG CHEST PORT 1 VIEW  Result Date: 02/06/2023 CLINICAL DATA:  Fever abdominal pain EXAM: PORTABLE CHEST 1 VIEW COMPARISON:  03/31/2020 FINDINGS: Esophageal tube tip below the diaphragm and curled at the fundus of stomach. Low lung volumes with atelectasis or pneumonia at the right base. Mild  cardiomegaly with aortic atherosclerosis. IMPRESSION: Low lung volumes with atelectasis or pneumonia at the right base. Electronically Signed   By: Jasmine Pang M.D.   On: 02/06/2023 22:01   DG Abd Portable 1V  Result Date: 02/06/2023 CLINICAL DATA:  Readjustment of NG tube. EXAM: PORTABLE ABDOMEN - 1 VIEW COMPARISON:  Earlier same day FINDINGS: Interval advancement of enteric tube with tip and side port now projecting over the expected location of the gastric fundus. Otherwise, stable examination. IMPRESSION: 1. Interval advancement of enteric tube with tip and side port now projecting over the expected location of the gastric fundus. 2. Otherwise, stable examination, as described on abdominal radiograph performed earlier same day. Electronically Signed   By: Simonne Come M.D.   On: 02/06/2023 11:50   DG Abd Portable 1V  Result Date: 02/06/2023 CLINICAL DATA:  Confirm NG tube placement. Postoperative nausea and vomiting. EXAM: PORTABLE ABDOMEN - 1 VIEW COMPARISON:  Earlier same day; 02/04/2023 FINDINGS: Interval retraction enteric tube with tip and side port projected over the distal esophagus. Advancement at least 17 cm is advised. Redemonstrated moderate-to-marked gaseous distention of multiple loops of small bowel with index loop of small bowel within the right mid abdomen measuring 5.3 cm in diameter. Midline abdominal staples. Limited visualization of the lower thorax demonstrates trace right-sided pleural effusion with bibasilar heterogeneous/consolidative opacities, right-greater-than-left. No acute osseous abnormalities. Mild scoliotic curvature of the thoracolumbar spine with associated multilevel DDD, incompletely evaluated. IMPRESSION: 1. Interval retraction enteric tube with tip and side port projected over the distal esophagus. Advancement at least 17 cm is advised. 2. Redemonstrated moderate-to-marked gaseous distention of multiple loops of small bowel, nonspecific though worrisome for small  bowel obstruction. 3. Trace right-sided pleural effusion with bibasilar heterogeneous/consolidative opacities, right-greater-than-left, atelectasis versus infiltrate. Electronically Signed   By: Simonne Come M.D.   On: 02/06/2023 11:49   DG Abd Portable 1V-Small Bowel Obstruction Protocol-initial, 8 hr delay  Result Date: 02/04/2023 CLINICAL DATA:  Follow-up small bowel obstruction. 8 hour delay imaging. EXAM: PORTABLE ABDOMEN - 1 VIEW COMPARISON:  02/03/2023 . FINDINGS: there is an enteric tube with tip and side port in the stomach. Persistent dilated small bowel loops are identified which measure up to 3.8 cm. Gas and stool noted within normal caliber colon. IMPRESSION: Persistent small bowel obstruction pattern. Electronically Signed   By: Signa Kell M.D.   On: 02/04/2023 05:43   DG Abd Portable 1V-Small Bowel Protocol-Position Verification  Result Date: 02/03/2023 CLINICAL DATA:  Enteric tube placement EXAM: PORTABLE ABDOMEN - 1 VIEW COMPARISON:  Same day CT abdomen and pelvis FINDINGS: Gastric/enteric tube tip projects over the stomach. Partially imaged dilated small bowel loops in the right hemiabdomen. IMPRESSION: Gastric/enteric tube tip projects over the stomach. Electronically Signed   By: Agustin Cree M.D.   On: 02/03/2023 19:27   CT ABDOMEN PELVIS W CONTRAST  Result Date: 02/03/2023 CLINICAL DATA:  Bilateral upper quadrant pain. Nausea, vomiting, and diarrhea for 2 days. EXAM: CT ABDOMEN AND PELVIS WITH CONTRAST TECHNIQUE: Multidetector CT imaging of the abdomen and pelvis was performed using the standard protocol following bolus administration of intravenous contrast. RADIATION DOSE REDUCTION: This exam was performed  according to the departmental dose-optimization program which includes automated exposure control, adjustment of the mA and/or kV according to patient size and/or use of iterative reconstruction technique. CONTRAST:  OMNIPAQUE IOHEXOL 300 MG/ML  SOLN COMPARISON:   07/10/2013 FINDINGS: Lower Chest: No acute findings. Hepatobiliary: No suspicious hepatic masses identified. Gallbladder is unremarkable. No evidence of biliary ductal dilatation. Pancreas:  No mass or inflammatory changes. Spleen: Within normal limits in size and appearance. Adrenals/Urinary Tract: No suspicious masses identified. No evidence of ureteral calculi or hydronephrosis. Stomach/Bowel: Multiple moderately dilated small bowel loops are seen. A transition point is seen in the central small bowel mesentery which shows tethering, suspicious for adhesion. A markedly dilated small bowel loop is seen in the left abdomen which shows wall thickening and is suspicious for a closed loop obstruction. No evidence of pneumatosis, free intraperitoneal air, or abscess. Normal appendix visualized. Diverticulosis is seen mainly involving the descending and sigmoid colon, however there is no evidence of diverticulitis. Vascular/Lymphatic: No pathologically enlarged lymph nodes. No acute vascular findings. Reproductive: Prior hysterectomy noted. Adnexal regions are unremarkable in appearance. Tiny amount of free fluid noted in pelvic cul-de-sac. Other:  None. Musculoskeletal:  No suspicious bone lesions identified. IMPRESSION: Small bowel obstruction, with transition point in central small bowel mesentery suspicious for adhesion. Markedly dilated small bowel loop in left abdomen mild wall thickening, raising suspicion for closed loop obstruction. Surgical consultation is recommended. No evidence of pneumatosis, free air, or abscess. Colonic diverticulosis, without radiographic evidence of diverticulitis. Electronically Signed   By: Danae Orleans M.D.   On: 02/03/2023 17:36   MM 3D SCREENING MAMMOGRAM BILATERAL BREAST  Result Date: 01/24/2023 CLINICAL DATA:  Screening. EXAM: DIGITAL SCREENING BILATERAL MAMMOGRAM WITH TOMOSYNTHESIS AND CAD TECHNIQUE: Bilateral screening digital craniocaudal and mediolateral oblique  mammograms were obtained. Bilateral screening digital breast tomosynthesis was performed. The images were evaluated with computer-aided detection. COMPARISON:  Previous exam(s). ACR Breast Density Category b: There are scattered areas of fibroglandular density. FINDINGS: There are no findings suspicious for malignancy. IMPRESSION: No mammographic evidence of malignancy. A result letter of this screening mammogram will be mailed directly to the patient. RECOMMENDATION: Screening mammogram in one year. (Code:SM-B-01Y) BI-RADS CATEGORY  1: Negative. Electronically Signed   By: Frederico Hamman M.D.   On: 01/24/2023 14:51    Assessment and Plan:   Marginal Zone Lymphoma Newly diagnosed B-cell lymphoma found in the small bowel with positive margin. Discussed the indolent nature of this lymphoma and the possibility of watchful waiting.  Noted the need for further staging and surveillance. -Order PET scan to assess for disease elsewhere, can be done outpt -Perform unilateral bone marrow biopsy for accurate staging, can be done outpt -Consider single agent Rituximab in adjuvant setting or at disease progression. - I will order Hep B and C panel inpt  Hypertension Chronic condition, patient is compliant with medication. -Continue current management.  General Health Maintenance -Continue regular mammograms. -Follow-up in clinic for further management of lymphoma.  The length of time of the face-to-face encounter was 55 minutes. More than 50% of time was spent counseling and coordination of care.  Thank you for this referral.

## 2023-02-12 NOTE — Telephone Encounter (Signed)
Left patient a message in regards to scheduled appointment times/dates with [facility] per staff message sent on 10/1

## 2023-02-12 NOTE — NC FL2 (Signed)
[place] MEDICAID FL2 LEVEL OF CARE FORM     IDENTIFICATION  Patient Name: Kayla Lowe Birthdate: 10/13/1955 Sex: female Admission Date (Current Location): 02/03/2023  [facility] and [place] Number:  Producer, television/film/video and Address:  [facility] [place],  [address], [place] [address]      Provider Number: [id number]  Attending Physician Name and Address:  Darlin Drop, DO  Relative Name and Phone Number:  son, Vilinda Boehringer @ [phone]    Current Level of Care: Hospital Recommended Level of Care: Skilled Nursing Facility Prior Approval Number:    Date Approved/Denied:   PASRR Number: [id number]  Discharge Plan: SNF    Current Diagnoses: Patient Active Problem List   Diagnosis Date Noted   SBO (small bowel obstruction) (HCC) 02/03/2023   Nocturnal muscle cramps 10/17/2022   LVH (left ventricular hypertrophy) due to hypertensive disease, without heart failure 01/17/2022   Encounter for general adult medical examination with abnormal findings 01/09/2021   Stage 3a chronic kidney disease (HCC) 07/12/2020   Visit for screening mammogram 07/12/2020   Primary osteoarthritis of both knees 07/11/2020   Intrinsic eczema 03/14/2020   Mild persistent asthma without complication 03/14/2020   Vitamin D deficiency disease 09/18/2019   Familial hypercholesteremia 09/16/2019   Morton's neuroma of left foot 07/20/2019   Gastroesophageal reflux disease without esophagitis 02/09/2019   Osteopenia of neck of right femur 02/09/2019   Seasonal allergic rhinitis due to pollen 08/12/2017   Elevated rheumatoid factor 02/06/2016   Dyslipidemia, goal LDL below 130 09/10/2013   Essential hypertension, benign 09/10/2013    Orientation RESPIRATION BLADDER Height & Weight     Self, Time, Situation, Place  Normal Continent Weight: 201 lb 11.5 oz (91.5 kg) Height:  5\' 4"  (162.6 cm)  BEHAVIORAL SYMPTOMS/MOOD NEUROLOGICAL BOWEL NUTRITION STATUS      Continent  Diet (soft diet)  AMBULATORY STATUS COMMUNICATION OF NEEDS Skin   Limited Assist Verbally Surgical wounds (abdomenal wound:    Moist-to-dry dressing changes to midline wound BID)                       Personal Care Assistance Level of Assistance  Bathing, Dressing Bathing Assistance: Limited assistance   Dressing Assistance: Limited assistance     Functional Limitations Info  Sight, Hearing, Speech Sight Info: Adequate Hearing Info: Adequate Speech Info: Adequate    SPECIAL CARE FACTORS FREQUENCY  PT (By licensed PT), OT (By licensed OT)     PT Frequency: 5x/wk OT Frequency: 5x/wk            Contractures Contractures Info: Not present    Additional Factors Info  Code Status, Allergies Code Status Info: Full Allergies Info: Cozaar (Losartan Potassium), Lisinopril, Statins           Current Medications (02/12/2023):  This is the current hospital active medication list Current Facility-Administered Medications  Medication Dose Route Frequency Provider Last Rate Last Admin   acetaminophen (TYLENOL) tablet 1,000 mg  1,000 mg Oral QID Simaan, Elizabeth S, PA-C       amLODipine (NORVASC) tablet 10 mg  10 mg Oral Daily Arnetha Courser, MD   10 mg at 02/12/23 0910   Ampicillin-Sulbactam (UNASYN) 3 g in sodium chloride 0.9 % 100 mL IVPB  3 g Intravenous Q6H Arnetha Courser, MD 200 mL/hr at 02/12/23 1132 3 g at 02/12/23 1132   aspirin EC tablet 81 mg  81 mg Oral Daily Arnetha Courser, MD   81 mg at 02/12/23  0910   carvedilol (COREG) tablet 6.25 mg  6.25 mg Oral BID WC Arnetha Courser, MD   6.25 mg at 02/12/23 0909   dextrose 5 % in lactated ringers infusion   Intravenous Continuous Darlin Drop, DO 50 mL/hr at 02/11/23 1703 New Bag at 02/11/23 1703   enoxaparin (LOVENOX) injection 40 mg  40 mg Subcutaneous Q24H Adam Phenix, PA-C   40 mg at 02/12/23 1136   ezetimibe (ZETIA) tablet 10 mg  10 mg Oral Daily Arnetha Courser, MD   10 mg at 02/12/23 0909   famotidine (PEPCID)  tablet 40 mg  40 mg Oral QHS Arnetha Courser, MD   40 mg at 02/11/23 2113   hydrALAZINE (APRESOLINE) injection 5 mg  5 mg Intravenous Q6H PRN Anthoney Harada, NP   5 mg at 02/10/23 0013   hydrALAZINE (APRESOLINE) tablet 25 mg  25 mg Oral Q8H Arnetha Courser, MD   25 mg at 02/12/23 1430   ibuprofen (ADVIL) tablet 600 mg  600 mg Oral TID Adam Phenix, PA-C   600 mg at 02/12/23 1135   lidocaine (LIDODERM) 5 % 2 patch  2 patch Transdermal Q24H Adam Phenix, PA-C   2 patch at 02/12/23 0940   methocarbamol (ROBAXIN) tablet 1,000 mg  1,000 mg Oral QID Adam Phenix, PA-C   1,000 mg at 02/12/23 1135   montelukast (SINGULAIR) tablet 10 mg  10 mg Oral QHS Arnetha Courser, MD   10 mg at 02/11/23 2113   morphine (PF) 2 MG/ML injection 2-4 mg  2-4 mg Intravenous Q2H PRN Juliet Rude, PA-C   2 mg at 02/12/23 1435   ondansetron (ZOFRAN) tablet 4 mg  4 mg Oral Q6H PRN Juliet Rude, PA-C       Or   ondansetron [facility]) injection 4 mg  4 mg Intravenous Q6H PRN Trixie Deis R, PA-C   4 mg at 02/10/23 0013   pantoprazole (PROTONIX) EC tablet 40 mg  40 mg Oral Daily Arnetha Courser, MD   40 mg at 02/12/23 0910   phenol (CHLORASEPTIC) mouth spray 1 spray  1 spray Mouth/Throat PRN Lanae Boast, MD   1 spray at 02/04/23 2051   pravastatin (PRAVACHOL) tablet 40 mg  40 mg Oral q1800 Arnetha Courser, MD   40 mg at 02/11/23 1610   traMADol (ULTRAM) tablet 50-100 mg  50-100 mg Oral Q6H PRN Adam Phenix, PA-C   100 mg at 02/12/23 1142     Discharge Medications: Please see discharge summary for a list of discharge medications.  Relevant Imaging Results:  Relevant Lab Results:   Additional Information SS# [id number]  Amada Jupiter, LCSW

## 2023-02-12 NOTE — Consult Note (Incomplete)
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    HPI:      Past Medical History:  Diagnosis Date   GERD (gastroesophageal reflux disease)    Hyperlipidemia    Hypertension    Osteoarthritis   :   Past Surgical History:  Procedure Laterality Date   ABDOMINAL HYSTERECTOMY  1987   LAPAROTOMY N/A 02/04/2023   Procedure: EXPLORATORY LAPAROTOMY, WITH SMALL BOWEL RESECTION;  Surgeon: Abigail Miyamoto, MD;  Location: [facility];  Service: General;  Laterality: N/A;  :   Current Facility-Administered Medications  Medication Dose Route Frequency Provider Last Rate Last Admin   acetaminophen (TYLENOL) tablet 1,000 mg  1,000 mg Oral QID Simaan, Elizabeth S, PA-C       amLODipine (NORVASC) tablet 10 mg  10 mg Oral Daily Arnetha Courser, MD   10 mg at 02/12/23 0910   Ampicillin-Sulbactam (UNASYN) 3 g in sodium chloride 0.9 % 100 mL IVPB  3 g Intravenous Q6H Amin, Tilman Neat, MD 200 mL/hr at 02/12/23 1132 3 g at 02/12/23 1132   aspirin EC tablet 81 mg  81 mg Oral Daily Arnetha Courser, MD   81 mg at 02/12/23 0910   carvedilol (COREG) tablet 6.25 mg  6.25 mg Oral BID WC Arnetha Courser, MD   6.25 mg at 02/12/23 0909   dextrose 5 % in lactated ringers infusion   Intravenous Continuous Dow Adolph N, DO 50 mL/hr at 02/11/23 1703 New Bag at 02/11/23 1703   enoxaparin (LOVENOX) injection 40 mg  40 mg Subcutaneous Q24H Adam Phenix, PA-C   40 mg at 02/12/23 1136   ezetimibe (ZETIA) tablet 10 mg  10 mg Oral Daily Arnetha Courser, MD   10 mg at 02/12/23 0909   famotidine (PEPCID) tablet 40 mg  40 mg Oral QHS Arnetha Courser, MD   40 mg at 02/11/23 2113   hydrALAZINE (APRESOLINE) injection 5 mg  5 mg Intravenous Q6H PRN Anthoney Harada, NP   5 mg at 02/10/23 0013   hydrALAZINE (APRESOLINE) tablet 25 mg  25 mg Oral Q8H Amin, Tilman Neat, MD   25  mg at 02/12/23 0506   ibuprofen (ADVIL) tablet 600 mg  600 mg Oral TID Adam Phenix, PA-C   600 mg at 02/12/23 1135   lidocaine (LIDODERM) 5 % 2 patch  2 patch Transdermal Q24H Adam Phenix, PA-C   2 patch at 02/12/23 0940   methocarbamol (ROBAXIN) tablet 1,000 mg  1,000 mg Oral QID Adam Phenix, PA-C   1,000 mg at 02/12/23 1135   montelukast (SINGULAIR) tablet 10 mg  10 mg Oral QHS Arnetha Courser, MD   10 mg at 02/11/23 2113   morphine (PF) 2 MG/ML injection 2-4 mg  2-4 mg Intravenous Q2H PRN Trixie Deis R, PA-C   4 mg at 02/12/23 0906   ondansetron (ZOFRAN) tablet 4 mg  4 mg Oral Q6H PRN Juliet Rude, PA-C       Or   ondansetron [facility]) injection 4 mg  4 mg Intravenous Q6H PRN Trixie Deis R, PA-C   4 mg at 02/10/23 0013   pantoprazole (PROTONIX) EC tablet 40 mg  40 mg Oral Daily Arnetha Courser, MD   40 mg at 02/12/23 0910   phenol (CHLORASEPTIC) mouth spray 1 spray  1 spray Mouth/Throat PRN Lanae Boast, MD   1 spray at  02/04/23 2051   pravastatin (PRAVACHOL) tablet 40 mg  40 mg Oral q1800 Arnetha Courser, MD   40 mg at 02/11/23 1610   traMADol (ULTRAM) tablet 50-100 mg  50-100 mg Oral Q6H PRN Adam Phenix, PA-C   100 mg at 02/12/23 1142      Allergies  Allergen Reactions   Cozaar [Losartan Potassium]     Caused cough and sweats. Tingling in legs.   Lisinopril Cough   Statins Other (See Comments)    myalgias  :   Family History  Problem Relation Age of Onset   Cancer Father    Hypertension Brother    Diabetes Brother    Diabetes Paternal Grandfather    Diabetes Paternal Uncle    Colon cancer Neg Hx    Hypercalcemia Neg Hx   :   Social History   Socioeconomic History   Marital status: Legally Separated    Spouse name: Not on file   Number of children: Not on file   Years of education: Not on file   Highest education level: Not on file  Occupational History   Not on file  Tobacco Use   Smoking status: Never   Smokeless tobacco: Never   Substance and Sexual Activity   Alcohol use: No   Drug use: No   Sexual activity: Not Currently  Other Topics Concern   Not on file  Social History Narrative   Not on file   Social Determinants of Health   Financial Resource Strain: Low Risk  (03/22/2022)   Overall Financial Resource Strain (CARDIA)    Difficulty of Paying Living Expenses: Not hard at all  Food Insecurity: No Food Insecurity (02/03/2023)   Hunger Vital Sign    Worried About Running Out of Food in the Last Year: Never true    Ran Out of Food in the Last Year: Never true  Transportation Needs: No Transportation Needs (02/03/2023)   PRAPARE - Administrator, Civil Service (Medical): No    Lack of Transportation (Non-Medical): No  Physical Activity: Inactive (03/22/2022)   Exercise Vital Sign    Days of Exercise per Week: 0 days    Minutes of Exercise per Session: 0 min  Stress: No Stress Concern Present (03/22/2022)   [employer] of Occupational Health - Occupational Stress Questionnaire    Feeling of Stress : Not at all  Social Connections: Moderately Integrated (03/22/2022)   Social Connection and Isolation Panel [NHANES]    Frequency of Communication with Friends and Family: More than three times a week    Frequency of Social Gatherings with Friends and Family: More than three times a week    Attends Religious Services: More than 4 times per year    Active Member of [employer] or Organizations: No    Attends Engineer, structural: More than 4 times per year    Marital Status: Separated  Intimate Partner Violence: Not At Risk (02/03/2023)   Humiliation, Afraid, Rape, and Kick questionnaire    Fear of Current or Ex-Partner: No    Emotionally Abused: No    Physically Abused: No    Sexually Abused: No  :  {Ros - complete:30496}  Exam: Patient Vitals for the past 24 hrs:  BP Temp Temp src Pulse Resp SpO2  02/12/23 1412 (!) 156/77 97.9 F (36.6 C) Oral 81 18 92 %  02/12/23 0606 (!) 140/72  98.4 F (36.9 C) Oral 79 16 98 %  02/11/23 2021 (!) 160/82 98.9 F (37.2  C) Oral 88 18 98 %    General:  well-nourished in no acute distress.  Eyes:  no scleral icterus.  ENT:  There were no oropharyngeal lesions.  Neck was without thyromegaly.  Lymphatics:  Negative cervical, supraclavicular or axillary adenopathy.  Respiratory: lungs were clear bilaterally without wheezing or crackles.  Cardiovascular:  Regular rate and rhythm, S1/S2, without murmur, rub or gallop.  There was no pedal edema.  GI:  abdomen was soft, flat, nontender, nondistended, without organomegaly.  Muscoloskeletal:  no spinal tenderness of palpation of vertebral spine.  Skin exam was without echymosis, petichae.  Neuro exam was nonfocal.  Patient was able to get on and off exam table without assistance.  Gait was normal.  Patient was alerted and oriented.  Attention was good.   Language was appropriate.  Mood was normal without depression.  Speech was not pressured.  Thought content was not tangential.     Lab Results  Component Value Date   WBC 12.1 (H) 02/12/2023   HGB 10.4 (L) 02/12/2023   HCT 33.0 (L) 02/12/2023   PLT 367 02/12/2023   GLUCOSE 96 02/12/2023   CHOL 187 04/18/2022   TRIG 127.0 04/18/2022   HDL 54.00 04/18/2022   LDLDIRECT 220.0 09/16/2019   LDLCALC 108 (H) 04/18/2022   ALT 138 (H) 02/12/2023   AST 42 (H) 02/12/2023   NA 135 02/12/2023   K 3.3 (L) 02/12/2023   CL 106 02/12/2023   CREATININE 0.78 02/12/2023   BUN 12 02/12/2023   CO2 22 02/12/2023    DG Abd 1 View  Result Date: 02/09/2023 CLINICAL DATA:  Abdominal distension.  Abdominal pain.  Diarrhea. EXAM: ABDOMEN - 1 VIEW COMPARISON:  02/06/2023 FINDINGS: No nasogastric tube is visible. Moderate amount of air present in the stomach. Dilated loops of small intestine are present which could be due to ileus or partial small bowel obstruction. Colon pattern is normal. IMPRESSION: 1. No nasogastric tube visible. 2. Dilated loops of small intestine  that could be due to ileus or partial small bowel obstruction. Similar appearance to the study of 3 days ago. Electronically Signed   By: Paulina Fusi M.D.   On: 02/09/2023 13:19   DG CHEST PORT 1 VIEW  Result Date: 02/06/2023 CLINICAL DATA:  Fever abdominal pain EXAM: PORTABLE CHEST 1 VIEW COMPARISON:  03/31/2020 FINDINGS: Esophageal tube tip below the diaphragm and curled at the fundus of stomach. Low lung volumes with atelectasis or pneumonia at the right base. Mild cardiomegaly with aortic atherosclerosis. IMPRESSION: Low lung volumes with atelectasis or pneumonia at the right base. Electronically Signed   By: Jasmine Pang M.D.   On: 02/06/2023 22:01   DG Abd Portable 1V  Result Date: 02/06/2023 CLINICAL DATA:  Readjustment of NG tube. EXAM: PORTABLE ABDOMEN - 1 VIEW COMPARISON:  Earlier same day FINDINGS: Interval advancement of enteric tube with tip and side port now projecting over the expected location of the gastric fundus. Otherwise, stable examination. IMPRESSION: 1. Interval advancement of enteric tube with tip and side port now projecting over the expected location of the gastric fundus. 2. Otherwise, stable examination, as described on abdominal radiograph performed earlier same day. Electronically Signed   By: Simonne Come M.D.   On: 02/06/2023 11:50   DG Abd Portable 1V  Result Date: 02/06/2023 CLINICAL DATA:  Confirm NG tube placement. Postoperative nausea and vomiting. EXAM: PORTABLE ABDOMEN - 1 VIEW COMPARISON:  Earlier same day; 02/04/2023 FINDINGS: Interval retraction enteric tube with tip and side port  projected over the distal esophagus. Advancement at least 17 cm is advised. Redemonstrated moderate-to-marked gaseous distention of multiple loops of small bowel with index loop of small bowel within the right mid abdomen measuring 5.3 cm in diameter. Midline abdominal staples. Limited visualization of the lower thorax demonstrates trace right-sided pleural effusion with bibasilar  heterogeneous/consolidative opacities, right-greater-than-left. No acute osseous abnormalities. Mild scoliotic curvature of the thoracolumbar spine with associated multilevel DDD, incompletely evaluated. IMPRESSION: 1. Interval retraction enteric tube with tip and side port projected over the distal esophagus. Advancement at least 17 cm is advised. 2. Redemonstrated moderate-to-marked gaseous distention of multiple loops of small bowel, nonspecific though worrisome for small bowel obstruction. 3. Trace right-sided pleural effusion with bibasilar heterogeneous/consolidative opacities, right-greater-than-left, atelectasis versus infiltrate. Electronically Signed   By: Simonne Come M.D.   On: 02/06/2023 11:49   DG Abd Portable 1V-Small Bowel Obstruction Protocol-initial, 8 hr delay  Result Date: 02/04/2023 CLINICAL DATA:  Follow-up small bowel obstruction. 8 hour delay imaging. EXAM: PORTABLE ABDOMEN - 1 VIEW COMPARISON:  02/03/2023 . FINDINGS: there is an enteric tube with tip and side port in the stomach. Persistent dilated small bowel loops are identified which measure up to 3.8 cm. Gas and stool noted within normal caliber colon. IMPRESSION: Persistent small bowel obstruction pattern. Electronically Signed   By: Signa Kell M.D.   On: 02/04/2023 05:43   DG Abd Portable 1V-Small Bowel Protocol-Position Verification  Result Date: 02/03/2023 CLINICAL DATA:  Enteric tube placement EXAM: PORTABLE ABDOMEN - 1 VIEW COMPARISON:  Same day CT abdomen and pelvis FINDINGS: Gastric/enteric tube tip projects over the stomach. Partially imaged dilated small bowel loops in the right hemiabdomen. IMPRESSION: Gastric/enteric tube tip projects over the stomach. Electronically Signed   By: Agustin Cree M.D.   On: 02/03/2023 19:27   CT ABDOMEN PELVIS W CONTRAST  Result Date: 02/03/2023 CLINICAL DATA:  Bilateral upper quadrant pain. Nausea, vomiting, and diarrhea for 2 days. EXAM: CT ABDOMEN AND PELVIS WITH CONTRAST  TECHNIQUE: Multidetector CT imaging of the abdomen and pelvis was performed using the standard protocol following bolus administration of intravenous contrast. RADIATION DOSE REDUCTION: This exam was performed according to the departmental dose-optimization program which includes automated exposure control, adjustment of the mA and/or kV according to patient size and/or use of iterative reconstruction technique. CONTRAST:  OMNIPAQUE IOHEXOL 300 MG/ML  SOLN COMPARISON:  07/10/2013 FINDINGS: Lower Chest: No acute findings. Hepatobiliary: No suspicious hepatic masses identified. Gallbladder is unremarkable. No evidence of biliary ductal dilatation. Pancreas:  No mass or inflammatory changes. Spleen: Within normal limits in size and appearance. Adrenals/Urinary Tract: No suspicious masses identified. No evidence of ureteral calculi or hydronephrosis. Stomach/Bowel: Multiple moderately dilated small bowel loops are seen. A transition point is seen in the central small bowel mesentery which shows tethering, suspicious for adhesion. A markedly dilated small bowel loop is seen in the left abdomen which shows wall thickening and is suspicious for a closed loop obstruction. No evidence of pneumatosis, free intraperitoneal air, or abscess. Normal appendix visualized. Diverticulosis is seen mainly involving the descending and sigmoid colon, however there is no evidence of diverticulitis. Vascular/Lymphatic: No pathologically enlarged lymph nodes. No acute vascular findings. Reproductive: Prior hysterectomy noted. Adnexal regions are unremarkable in appearance. Tiny amount of free fluid noted in pelvic cul-de-sac. Other:  None. Musculoskeletal:  No suspicious bone lesions identified. IMPRESSION: Small bowel obstruction, with transition point in central small bowel mesentery suspicious for adhesion. Markedly dilated small bowel loop in left abdomen mild wall  thickening, raising suspicion for closed loop obstruction.  Surgical consultation is recommended. No evidence of pneumatosis, free air, or abscess. Colonic diverticulosis, without radiographic evidence of diverticulitis. Electronically Signed   By: Danae Orleans M.D.   On: 02/03/2023 17:36   MM 3D SCREENING MAMMOGRAM BILATERAL BREAST  Result Date: 01/24/2023 CLINICAL DATA:  Screening. EXAM: DIGITAL SCREENING BILATERAL MAMMOGRAM WITH TOMOSYNTHESIS AND CAD TECHNIQUE: Bilateral screening digital craniocaudal and mediolateral oblique mammograms were obtained. Bilateral screening digital breast tomosynthesis was performed. The images were evaluated with computer-aided detection. COMPARISON:  Previous exam(s). ACR Breast Density Category b: There are scattered areas of fibroglandular density. FINDINGS: There are no findings suspicious for malignancy. IMPRESSION: No mammographic evidence of malignancy. A result letter of this screening mammogram will be mailed directly to the patient. RECOMMENDATION: Screening mammogram in one year. (Code:SM-B-01Y) BI-RADS CATEGORY  1: Negative. Electronically Signed   By: Frederico Hamman M.D.   On: 01/24/2023 14:51    Pathology:  A. 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  -Lateral margins are negative for lymphoma; however lymphoid cells are  present at the radial margin.    DG Abd 1 View  Result Date: 02/09/2023 CLINICAL DATA:  Abdominal distension.  Abdominal pain.  Diarrhea. EXAM: ABDOMEN - 1 VIEW COMPARISON:  02/06/2023 FINDINGS: No nasogastric tube is visible. Moderate amount of air present in the stomach. Dilated loops of small intestine are present which could be due to ileus or partial small bowel obstruction. Colon pattern is normal. IMPRESSION: 1. No nasogastric tube visible. 2. Dilated loops of small intestine that could be due to ileus or partial small bowel obstruction. Similar appearance to the study of 3 days ago. Electronically  Signed   By: Paulina Fusi M.D.   On: 02/09/2023 13:19   DG CHEST PORT 1 VIEW  Result Date: 02/06/2023 CLINICAL DATA:  Fever abdominal pain EXAM: PORTABLE CHEST 1 VIEW COMPARISON:  03/31/2020 FINDINGS: Esophageal tube tip below the diaphragm and curled at the fundus of stomach. Low lung volumes with atelectasis or pneumonia at the right base. Mild cardiomegaly with aortic atherosclerosis. IMPRESSION: Low lung volumes with atelectasis or pneumonia at the right base. Electronically Signed   By: Jasmine Pang M.D.   On: 02/06/2023 22:01   DG Abd Portable 1V  Result Date: 02/06/2023 CLINICAL DATA:  Readjustment of NG tube. EXAM: PORTABLE ABDOMEN - 1 VIEW COMPARISON:  Earlier same day FINDINGS: Interval advancement of enteric tube with tip and side port now projecting over the expected location of the gastric fundus. Otherwise, stable examination. IMPRESSION: 1. Interval advancement of enteric tube with tip and side port now projecting over the expected location of the gastric fundus. 2. Otherwise, stable examination, as described on abdominal radiograph performed earlier same day. Electronically Signed   By: Simonne Come M.D.   On: 02/06/2023 11:50   DG Abd Portable 1V  Result Date: 02/06/2023 CLINICAL DATA:  Confirm NG tube placement. Postoperative nausea and vomiting. EXAM: PORTABLE ABDOMEN - 1 VIEW COMPARISON:  Earlier same day; 02/04/2023 FINDINGS: Interval retraction enteric tube with tip and side port projected over the distal esophagus. Advancement at least 17 cm is advised. Redemonstrated moderate-to-marked gaseous distention of multiple loops of small bowel with index loop of small bowel within the right mid abdomen measuring 5.3 cm in diameter. Midline abdominal staples. Limited visualization of the lower thorax demonstrates trace right-sided pleural effusion with bibasilar heterogeneous/consolidative opacities, right-greater-than-left. No acute  osseous abnormalities. Mild scoliotic curvature of the  thoracolumbar spine with associated multilevel DDD, incompletely evaluated. IMPRESSION: 1. Interval retraction enteric tube with tip and side port projected over the distal esophagus. Advancement at least 17 cm is advised. 2. Redemonstrated moderate-to-marked gaseous distention of multiple loops of small bowel, nonspecific though worrisome for small bowel obstruction. 3. Trace right-sided pleural effusion with bibasilar heterogeneous/consolidative opacities, right-greater-than-left, atelectasis versus infiltrate. Electronically Signed   By: Simonne Come M.D.   On: 02/06/2023 11:49   DG Abd Portable 1V-Small Bowel Obstruction Protocol-initial, 8 hr delay  Result Date: 02/04/2023 CLINICAL DATA:  Follow-up small bowel obstruction. 8 hour delay imaging. EXAM: PORTABLE ABDOMEN - 1 VIEW COMPARISON:  02/03/2023 . FINDINGS: there is an enteric tube with tip and side port in the stomach. Persistent dilated small bowel loops are identified which measure up to 3.8 cm. Gas and stool noted within normal caliber colon. IMPRESSION: Persistent small bowel obstruction pattern. Electronically Signed   By: Signa Kell M.D.   On: 02/04/2023 05:43   DG Abd Portable 1V-Small Bowel Protocol-Position Verification  Result Date: 02/03/2023 CLINICAL DATA:  Enteric tube placement EXAM: PORTABLE ABDOMEN - 1 VIEW COMPARISON:  Same day CT abdomen and pelvis FINDINGS: Gastric/enteric tube tip projects over the stomach. Partially imaged dilated small bowel loops in the right hemiabdomen. IMPRESSION: Gastric/enteric tube tip projects over the stomach. Electronically Signed   By: Agustin Cree M.D.   On: 02/03/2023 19:27   CT ABDOMEN PELVIS W CONTRAST  Result Date: 02/03/2023 CLINICAL DATA:  Bilateral upper quadrant pain. Nausea, vomiting, and diarrhea for 2 days. EXAM: CT ABDOMEN AND PELVIS WITH CONTRAST TECHNIQUE: Multidetector CT imaging of the abdomen and pelvis was performed using the standard protocol following bolus administration of  intravenous contrast. RADIATION DOSE REDUCTION: This exam was performed according to the departmental dose-optimization program which includes automated exposure control, adjustment of the mA and/or kV according to patient size and/or use of iterative reconstruction technique. CONTRAST:  OMNIPAQUE IOHEXOL 300 MG/ML  SOLN COMPARISON:  07/10/2013 FINDINGS: Lower Chest: No acute findings. Hepatobiliary: No suspicious hepatic masses identified. Gallbladder is unremarkable. No evidence of biliary ductal dilatation. Pancreas:  No mass or inflammatory changes. Spleen: Within normal limits in size and appearance. Adrenals/Urinary Tract: No suspicious masses identified. No evidence of ureteral calculi or hydronephrosis. Stomach/Bowel: Multiple moderately dilated small bowel loops are seen. A transition point is seen in the central small bowel mesentery which shows tethering, suspicious for adhesion. A markedly dilated small bowel loop is seen in the left abdomen which shows wall thickening and is suspicious for a closed loop obstruction. No evidence of pneumatosis, free intraperitoneal air, or abscess. Normal appendix visualized. Diverticulosis is seen mainly involving the descending and sigmoid colon, however there is no evidence of diverticulitis. Vascular/Lymphatic: No pathologically enlarged lymph nodes. No acute vascular findings. Reproductive: Prior hysterectomy noted. Adnexal regions are unremarkable in appearance. Tiny amount of free fluid noted in pelvic cul-de-sac. Other:  None. Musculoskeletal:  No suspicious bone lesions identified. IMPRESSION: Small bowel obstruction, with transition point in central small bowel mesentery suspicious for adhesion. Markedly dilated small bowel loop in left abdomen mild wall thickening, raising suspicion for closed loop obstruction. Surgical consultation is recommended. No evidence of pneumatosis, free air, or abscess. Colonic diverticulosis, without radiographic evidence of  diverticulitis. Electronically Signed   By: Danae Orleans M.D.   On: 02/03/2023 17:36   MM 3D SCREENING MAMMOGRAM BILATERAL BREAST  Result Date: 01/24/2023 CLINICAL DATA:  Screening. EXAM: DIGITAL  SCREENING BILATERAL MAMMOGRAM WITH TOMOSYNTHESIS AND CAD TECHNIQUE: Bilateral screening digital craniocaudal and mediolateral oblique mammograms were obtained. Bilateral screening digital breast tomosynthesis was performed. The images were evaluated with computer-aided detection. COMPARISON:  Previous exam(s). ACR Breast Density Category b: There are scattered areas of fibroglandular density. FINDINGS: There are no findings suspicious for malignancy. IMPRESSION: No mammographic evidence of malignancy. A result letter of this screening mammogram will be mailed directly to the patient. RECOMMENDATION: Screening mammogram in one year. (Code:SM-B-01Y) BI-RADS CATEGORY  1: Negative. Electronically Signed   By: Frederico Hamman M.D.   On: 01/24/2023 14:51    Assessment and Plan:        The length of time of the face-to-face encounter was *** minutes. More than 50% of time was spent counseling and coordination of care.     Thank you for this referral.

## 2023-02-12 NOTE — TOC Progression Note (Signed)
Transition of Care [facility]) - Progression Note    Patient Details  Name: Kayla Lowe MRN: [id number] Date of Birth: 07/11/1955  [facility] CM/SW Contact  Amada Jupiter, LCSW Phone Number: 02/12/2023, 3:49 PM  Clinical Narrative:     Alerted by MD today that pt with concerns about her ability to manage home alone at discharge and asking if SNF rehab may be an option.  PT/OT were able to evaluate and have made recommendation for short term SNF to allow mobility gains and wound care support.  Have begun SNF bed search and was able to secure insurance authorization for SNF.  Pt and team aware and await medical clearance for dc to SNF.       Expected Discharge Plan and Services                                               Social Determinants of Health (SDOH) Interventions SDOH Screenings   Food Insecurity: No Food Insecurity (02/03/2023)  Housing: Low Risk  (02/03/2023)  Transportation Needs: No Transportation Needs (02/03/2023)  Utilities: Not At Risk (02/03/2023)  Alcohol Screen: Low Risk  (03/22/2022)  Depression (PHQ2-9): Low Risk  (03/22/2022)  Financial Resource Strain: Low Risk  (03/22/2022)  Physical Activity: Inactive (03/22/2022)  Social Connections: Moderately Integrated (03/22/2022)  Stress: No Stress Concern Present (03/22/2022)  Tobacco Use: Low Risk  (02/04/2023)    Readmission Risk Interventions    02/05/2023    2:50 PM  Readmission Risk Prevention Plan  Post Dischage Appt Complete  Medication Screening Complete  Transportation Screening Complete

## 2023-02-12 NOTE — Progress Notes (Signed)
Mobility Specialist - Progress Note   02/12/23 1535  Mobility  Activity Ambulated independently in hallway  Level of Assistance Modified independent, requires aide device or extra time  Assistive Device None  Distance Ambulated (ft) 180 ft  Range of Motion/Exercises Active  Activity Response Tolerated fair  Mobility Referral Yes  $Mobility charge 1 Mobility  Mobility Specialist Start Time (ACUTE ONLY) 1525  Mobility Specialist Stop Time (ACUTE ONLY) 1535  Mobility Specialist Time Calculation (min) (ACUTE ONLY) 10 min   Pt was found sitting EOB and agreeable to ambulate. Once getting in hallway began using hallway rail for support. After ~47ft stopped using rail. Has a slow gait movement and fatigued with session. Upon returning to room stated feeling winded during session. Returned to bed with all needs met. Call bell in reach and family in room.  Billey Chang Mobility Specialist

## 2023-02-12 NOTE — Evaluation (Signed)
Physical Therapy Evaluation Patient Details Name: Kayla Lowe MRN: [id number] DOB: Sep 28, 1955 Today's Date: 02/12/2023  History of Present Illness  Patient is a 67 year old female who presented on 9/22 with pain in right and left upper quadrants nausea and diarrhea. Patient was found to have small bowel obstruction. Patient underwent ex lap and small bowel resection on 9/23. 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 PMH: asthma, obesity, hypokalemia, HTN  Clinical Impression  Pt admitted with above diagnosis.  Pt currently with functional limitations due to the deficits listed below (see PT Problem List). Pt will benefit from acute skilled PT to increase their independence and safety with mobility to allow discharge.     The patient reports feeling tired, concerned  about  leakage from wound and being able to take care self at DC. Family in room and expresses concern  for patient home alone.  Patient  did ambulate with IV ole. Will need to  assess gait safety without a device or assess need for  RW. Continue PT for gait and practice steps if DC plan to return home.       If plan is discharge home, recommend the following: A little help with walking and/or transfers;A little help with bathing/dressing/bathroom;Help with stairs or ramp for entrance;Assist for transportation;Assistance with cooking/housework   Can travel by private vehicle   Yes    Equipment Recommendations Rolling walker (2 wheels)  Recommendations for Other Services       Functional Status Assessment Patient has had a recent decline in their functional status and demonstrates the ability to make significant improvements in function in a reasonable and predictable amount of time.     Precautions / Restrictions Precautions Precaution Comments: abdominal Required Braces or Orthoses: Other Brace Other Brace: ABD binder Restrictions Weight Bearing Restrictions: No       Mobility  Bed Mobility Overal bed mobility: Needs Assistance Bed Mobility: Sit to Supine           General bed mobility comments: min assist with legs onto bed    Transfers Overall transfer level: Modified independent Equipment used: None Transfers: Sit to/from Stand                  Ambulation/Gait Ambulation/Gait assistance: Supervision Gait Distance (Feet): 20 Feet (then 150) Assistive device: IV Pole Gait Pattern/deviations: Step-to pattern, Step-through pattern Gait velocity: decr     General Gait Details: very slow  to ambulate, relying on IV pole, offered a Rw, declined  Stairs            Wheelchair Mobility     Tilt Bed    Modified Rankin (Stroke Patients Only)       Balance Overall balance assessment: Mild deficits observed, not formally tested                                           Pertinent Vitals/Pain Pain Assessment Faces Pain Scale: Hurts even more Pain Location: abdomen Pain Descriptors / Indicators: Grimacing, Discomfort Pain Intervention(s): Monitored during session    Home Living Family/patient expects to be discharged to:: Private residence Living Arrangements: Alone Available Help at Discharge: Family;Available PRN/intermittently Type of Home: House Home Access: Stairs to enter   [employer] of Steps: 4 short steps along path to get to apartment   Home Layout: One level Home Equipment: None  Prior Function Prior Level of Function : Independent/Modified Independent               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     Extremity/Trunk Assessment   Upper Extremity Assessment Upper Extremity Assessment: Overall WFL for tasks assessed    Lower Extremity Assessment Lower Extremity Assessment: Overall WFL for tasks assessed    Cervical / Trunk Assessment Cervical / Trunk Assessment: Normal  Communication   Communication Communication: No  apparent difficulties  Cognition Arousal: Alert Behavior During Therapy: WFL for tasks assessed/performed, Flat affect Overall Cognitive Status: Within Functional Limits for tasks assessed                                          General Comments      Exercises     Assessment/Plan    PT Assessment Patient needs continued PT services  PT Problem List Decreased strength;Decreased activity tolerance;Decreased range of motion;Decreased mobility;Pain;Decreased balance       PT Treatment Interventions DME instruction;Therapeutic activities;Gait training;Therapeutic exercise;Patient/family education;Functional mobility training;Stair training    PT Goals (Current goals can be found in the Care Plan section)  Acute Rehab PT Goals Patient Stated Goal: to be able to go home and take care of self PT Goal Formulation: With patient Time For Goal Achievement: 02/26/23 Potential to Achieve Goals: Good    Frequency Min 1X/week     Co-evaluation               AM-PAC PT "6 Clicks" Mobility  Outcome Measure Help needed turning from your back to your side while in a flat bed without using bedrails?: None Help needed moving from lying on your back to sitting on the side of a flat bed without using bedrails?: None Help needed moving to and from a bed to a chair (including a wheelchair)?: A Little Help needed standing up from a chair using your arms (e.g., wheelchair or bedside chair)?: None Help needed to walk in hospital room?: A Little Help needed climbing 3-5 steps with a railing? : A Lot 6 Click Score: 20    End of Session   Activity Tolerance: Patient limited by fatigue Patient left: in bed;with bed alarm set;with call bell/phone within reach Nurse Communication: Mobility status PT Visit Diagnosis: Unsteadiness on feet (R26.81);Other abnormalities of gait and mobility (R26.89);Difficulty in walking, not elsewhere classified (R26.2);Pain    Time:  1050-1111 PT Time Calculation (min) (ACUTE ONLY): 21 min   Charges:   PT Evaluation $PT Eval Low Complexity: 1 Low   PT General Charges $$ ACUTE PT VISIT: 1 Visit         Blanchard Kelch PT Acute Rehabilitation Services Office [phone] Weekend pager-[phone]   Rada Hay 02/12/2023, 11:23 AM

## 2023-02-12 NOTE — Progress Notes (Signed)
8 Days Post-Op   Subjective/Chief Complaint: Eating a little more but still reports decreased appetite. Having BMs. Reports drainage from her wound. Patient lives in an apartment with a roommate who is unable to help her. She states she will not have anyone who can help at discharge. She reports one friend from church who works as an Charity fundraiser and she will ask about wound care assistance.   Objective: Vital signs in last 24 hours: Temp:  [97.9 F (36.6 C)-98.9 F (37.2 C)] 98.4 F (36.9 C) (10/01 0606) Pulse Rate:  [79-88] 79 (10/01 0606) Resp:  [16-18] 16 (10/01 0606) BP: (140-160)/(72-84) 140/72 (10/01 0606) SpO2:  [96 %-98 %] 98 % (10/01 0606) Last BM Date : 02/11/23  Intake/Output from previous day: 09/30 0701 - 10/01 0700 In: 6448.2 [P.O.:840; I.V.:3052.5; IV Piggyback:2555.8] Out: 1500 [Urine:1500] Intake/Output this shift: No intake/output data recorded.  General appearance: alert and cooperative, on commode GI: soft, midline incision with mild erythma and tenderness on right side of incision. SS and purulent drainage. staples removed by me and copious amounts of purulence drained. There is fascial dehiscence without evisceration. Superior portion of wound with 3-4 cm fascial separation. Moist-to-dry dressing replaced.   Lab Results:  Recent Labs    02/11/23 0417 02/12/23 0447  WBC 12.2* 12.1*  HGB 10.2* 10.4*  HCT 32.0* 33.0*  PLT 296 367   BMET Recent Labs    02/11/23 0417 02/12/23 0447  NA 138 135  K 3.0* 3.3*  CL 107 106  CO2 23 22  GLUCOSE 108* 96  BUN 13 12  CREATININE 0.67 0.78  CALCIUM 8.8* 8.8*   PT/INR No results for input(s): "LABPROT", "INR" in the last 72 hours. ABG No results for input(s): "PHART", "HCO3" in the last 72 hours.  Invalid input(s): "PCO2", "PO2"  Studies/Results: No results found.  Anti-infectives: Anti-infectives (From admission, onward)    Start     Dose/Rate Route Frequency Ordered Stop   02/08/23 1000  azithromycin  (ZITHROMAX) tablet 250 mg       Placed in "Followed by" Linked Group   250 mg Oral Daily 02/07/23 0912 02/11/23 0907   02/07/23 1200  Ampicillin-Sulbactam (UNASYN) 3 g in sodium chloride 0.9 % 100 mL IVPB        3 g 200 mL/hr over 30 Minutes Intravenous Every 6 hours 02/07/23 0946     02/07/23 1000  azithromycin (ZITHROMAX) tablet 500 mg       Placed in "Followed by" Linked Group   500 mg Oral Daily 02/07/23 0912 02/07/23 0932   02/04/23 0930  cefOXitin (MEFOXIN) 2 g in sodium chloride 0.9 % 100 mL IVPB        2 g 200 mL/hr over 30 Minutes Intravenous  Once 02/04/23 0844 02/04/23 1011       Assessment/Plan: SBO  S/p exploratory laparotomy, small bowel resection 9/23 DB POD#8 AFVSS  Surgical pathology: 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  She will need outpatient oncology follow up    - soft diet - midline staples removed due to wound infection >> there is some fascial separation. Patient has not had breakfast. Will discuss with MD - return to OR for washout/fascial closure vs continue with abx and wound care. - abx for HCAP per primary team (Unasyn day #6) -GERD meds - daily lovenox for DVT ppx - TOC consult for possible short term SNF placement.   LOS: 9 days    Adam Phenix 02/12/2023

## 2023-02-13 DIAGNOSIS — K56609 Unspecified intestinal obstruction, unspecified as to partial versus complete obstruction: Secondary | ICD-10-CM | POA: Diagnosis not present

## 2023-02-13 LAB — CBC
HCT: 35 % — ABNORMAL LOW (ref 36.0–46.0)
Hemoglobin: 10.8 g/dL — ABNORMAL LOW (ref 12.0–15.0)
MCH: 30.8 pg (ref 26.0–34.0)
MCHC: 30.9 g/dL (ref 30.0–36.0)
MCV: 99.7 fL (ref 80.0–100.0)
Platelets: 445 10*3/uL — ABNORMAL HIGH (ref 150–400)
RBC: 3.51 MIL/uL — ABNORMAL LOW (ref 3.87–5.11)
RDW: 14.6 % (ref 11.5–15.5)
WBC: 12 10*3/uL — ABNORMAL HIGH (ref 4.0–10.5)
nRBC: 0 % (ref 0.0–0.2)

## 2023-02-13 LAB — HEPATITIS PANEL, ACUTE
HCV Ab: NONREACTIVE
Hep A IgM: NONREACTIVE
Hep B C IgM: NONREACTIVE
Hepatitis B Surface Ag: NONREACTIVE

## 2023-02-13 LAB — BASIC METABOLIC PANEL
Anion gap: 7 (ref 5–15)
BUN: 12 mg/dL (ref 8–23)
CO2: 23 mmol/L (ref 22–32)
Calcium: 8.7 mg/dL — ABNORMAL LOW (ref 8.9–10.3)
Chloride: 107 mmol/L (ref 98–111)
Creatinine, Ser: 0.76 mg/dL (ref 0.44–1.00)
GFR, Estimated: >60 mL/min (ref >60)
Glucose, Bld: 89 mg/dL (ref 70–99)
Potassium: 3.5 mmol/L (ref 3.5–5.1)
Sodium: 137 mmol/L (ref 135–145)

## 2023-02-13 LAB — MAGNESIUM: Magnesium: 2.2 mg/dL (ref 1.7–2.4)

## 2023-02-13 NOTE — Progress Notes (Signed)
PROGRESS NOTE    Kayla Lowe  WNU:[id number] DOB: 10-31-1955 DOA: 02/03/2023 PCP: Etta Grandchild, MD   Brief Narrative: 67 y.o. female with medical history significant of GERD, essential hypertension, hyperlipidemia, osteoarthritis, history of total abdominal hysterectomy in 1987 who presents to the ER with nausea and pain is in the right and left upper quadrants.  Associated with diarrhea for last 2 days.  CT abdomen pelvis showed signs of small bowel obstruction.  Seen by general surgery, status post exploratory laparotomy, small bowel resection on 02/04/2023.  Reports multiple bowel movements.  She has been up and walking.  Concern for pneumonia seen on chest x-ray done on 02/06/2023, was started on Unasyn and Z-Pak empirically. 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  Seen by medical oncology.  General surgery following.  Assessment & Plan:   Principal Problem:   SBO (small bowel obstruction) (HCC) Active Problems:   Dyslipidemia, goal LDL below 130   Essential hypertension, benign   Gastroesophageal reflux disease without esophagitis   Stage 3a chronic kidney disease (HCC)   Small bowel obstruction secondary to small bowel mass, POA, status post exploratory laparotomy, on 02/04/2023. 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 Seen by medical oncology, will need further staging and surveillance. Continue to mobilize as tolerated Continue gentle IV fluid hydration As needed analgesics   Newly diagnosed B-cell lymphoma/marginal zone lymphoma, seen on pathology Seen by medical oncology Dr. Al Pimple Appreciate oncology's recommendations   Right lower lobe pneumonia, POA Continue Unasyn Monitor fever curve and WBCs Maintain O2 saturation above 92%   Hypertension BP is not at goal, elevated. Continue home  oral antihypertensive Closely monitor vital signs   Hyperlipidemia Continue home regimen.   Asthma Continue home bronchodilator. Continue home regimen.   Obesity BMI 34 Recommend weight loss outpatient regular physical activity and healthy dieting.   Refractory hypokalemia Repleted intravenously.   Physical debility Continue PT OT Fall precautions   Bilateral lower extremity edema, symmetrical Suspect iatrogenic Reduce rate of IV fluid Elevate lower extremities.   Resolved AKI, prerenal in the setting of dehydration Baseline creatinine 0.67 with GFR greater than 60 Presented with creatinine of 1.17 with GFR of 51 Continue to avoid nephrotoxic agents, dehydration, and hypotension.    Estimated body mass index is 34.63 kg/m as calculated from the following:   Height as of this encounter: 5\' 4"  (1.626 m).   Weight as of this encounter: 91.5 kg.  DVT prophylaxis: Lovenox  code Status:full  Code  family Communication: none Disposition Plan:  Status is: Inpatient Consultants:  General surgery and oncology  Procedures: ex laparotomy Antimicrobials: Anti-infectives (From admission, onward)    Start     Dose/Rate Route Frequency Ordered Stop   02/08/23 1000  azithromycin (ZITHROMAX) tablet 250 mg       Placed in "Followed by" Linked Group   250 mg Oral Daily 02/07/23 0912 02/11/23 0907   02/07/23 1200  Ampicillin-Sulbactam (UNASYN) 3 g in sodium chloride 0.9 % 100 mL IVPB        3 g 200 mL/hr over 30 Minutes Intravenous Every 6 hours 02/07/23 0946     02/07/23 1000  azithromycin (ZITHROMAX) tablet 500 mg       Placed in "Followed by" Linked Group   500 mg Oral Daily 02/07/23 0912 02/07/23 0932   02/04/23 0930  cefOXitin (MEFOXIN) 2 g in sodium chloride 0.9 % 100  mL IVPB        2 g 200 mL/hr over 30 Minutes Intravenous  Once 02/04/23 0844 02/04/23 1011        Subjective: Had BMs denies nausea vomiting passing gas Potassium 3.1 yesterday will repeat  labs  Objective: Vitals:   02/12/23 0606 02/12/23 1412 02/12/23 2010 02/13/23 0620  BP: (!) 140/72 (!) 156/77 (!) 151/77 (!) 147/78  Pulse: 79 81 74 78  Resp: 16 18 18 18   Temp: 98.4 F (36.9 C) 97.9 F (36.6 C) 98 F (36.7 C) 97.8 F (36.6 C)  TempSrc: Oral Oral Oral Oral  SpO2: 98% 92% 94% 95%  Weight:      Height:        Intake/Output Summary (Last 24 hours) at 02/13/2023 1257 Last data filed at 02/13/2023 1000 Gross per 24 hour  Intake 1731.66 ml  Output 1300 ml  Net 431.66 ml   Filed Weights   02/03/23 1351 02/04/23 1335  Weight: 91.5 kg 91.5 kg    Examination:  General exam: Appears in no acute distress  respiratory system: Clear to auscultation. Respiratory effort normal. Cardiovascular system: S1 & S2 heard, RRR. No JVD, murmurs, rubs, gallops or clicks. No pedal edema. Gastrointestinal system: Abdomen is distended, soft and tender. No organomegaly or masses felt. Normal bowel sounds heard.  Picture noted from surgery notes Central nervous system: Alert and oriented. Extremities: Symmetric 5 x 5 power.  Data Reviewed: I have personally reviewed following labs and imaging studies  CBC: Recent Labs  Lab 02/07/23 0604 02/08/23 0436 02/09/23 0839 02/11/23 0417 02/12/23 0447  WBC 7.4 8.0 12.7* 12.2* 12.1*  HGB 11.1* 10.7* 12.0 10.2* 10.4*  HCT 36.9 34.3* 38.1 32.0* 33.0*  MCV 101.4* 98.3 95.3 97.0 95.7  PLT 268 275 321 296 367   Basic Metabolic Panel: Recent Labs  Lab 02/07/23 0604 02/08/23 0436 02/09/23 0839 02/11/23 0417 02/12/23 0447  NA 136 143 141 138 135  K 3.3* 3.2* 3.2* 3.0* 3.3*  CL 103 108 106 107 106  CO2 26 26 24 23 22   GLUCOSE 270* 118* 116* 108* 96  BUN 12 12 10 13 12   CREATININE 0.66 0.60 0.60 0.67 0.78  CALCIUM 9.2 9.6 10.0 8.8* 8.8*  MG  --  2.3  --  2.2  --    GFR: Estimated Creatinine Clearance: 72.7 mL/min (by C-G formula based on SCr of 0.78 mg/dL). Liver Function Tests: Recent Labs  Lab 02/12/23 0447  AST 42*  ALT  138*  ALKPHOS 91  BILITOT 0.8  PROT 5.9*  ALBUMIN 2.4*   No results for input(s): "LIPASE", "AMYLASE" in the last 168 hours. No results for input(s): "AMMONIA" in the last 168 hours. Coagulation Profile: No results for input(s): "INR", "PROTIME" in the last 168 hours. Cardiac Enzymes: No results for input(s): "CKTOTAL", "CKMB", "CKMBINDEX", "TROPONINI" in the last 168 hours. BNP (last 3 results) No results for input(s): "PROBNP" in the last 8760 hours. HbA1C: No results for input(s): "HGBA1C" in the last 72 hours. CBG: No results for input(s): "GLUCAP" in the last 168 hours. Lipid Profile: No results for input(s): "CHOL", "HDL", "LDLCALC", "TRIG", "CHOLHDL", "LDLDIRECT" in the last 72 hours. Thyroid Function Tests: No results for input(s): "TSH", "T4TOTAL", "FREET4", "T3FREE", "THYROIDAB" in the last 72 hours. Anemia Panel: No results for input(s): "VITAMINB12", "FOLATE", "FERRITIN", "TIBC", "IRON", "RETICCTPCT" in the last 72 hours. Sepsis Labs: Recent Labs  Lab 02/07/23 0604 02/12/23 0447  PROCALCITON 0.35 0.10    Recent Results (from the past  240 hour(s))  Surgical pcr screen     Status: None   Collection Time: 02/04/23  9:39 AM   Specimen: Nasal Mucosa; Nasal Swab  Result Value Ref Range Status   MRSA, PCR NEGATIVE NEGATIVE Final   Staphylococcus aureus NEGATIVE NEGATIVE Final    Comment: (NOTE) The Xpert SA Assay (FDA approved for NASAL specimens in patients 78 years of age and older), is one component of a comprehensive surveillance program. It is not intended to diagnose infection nor to guide or monitor treatment. Performed at [facility], [address], [place], [place] [address]   Culture, blood (Routine X 2) w Reflex to ID Panel     Status: None   Collection Time: 02/06/23  3:43 PM   Specimen: BLOOD LEFT ARM  Result Value Ref Range Status   Specimen Description   Final    BLOOD LEFT ARM Performed at [facility], [address] [place], [place] [address]    Special Requests   Final    BOTTLES DRAWN AEROBIC AND ANAEROBIC Blood Culture adequate volume Performed at [facility], [address], [place], [place] [address]    Culture   Final    NO GROWTH 5 DAYS Performed at [facility], [address], [place], [place] [address]    Report Status 02/11/2023 FINAL  Final  Culture, blood (Routine X 2) w Reflex to ID Panel     Status: None   Collection Time: 02/06/23  3:47 PM   Specimen: BLOOD LEFT ARM  Result Value Ref Range Status   Specimen Description   Final    BLOOD LEFT ARM Performed at [facility], [address], [place], [place] [address]    Special Requests   Final    BOTTLES DRAWN AEROBIC AND ANAEROBIC Blood Culture adequate volume Performed at [facility], [address] [place], [place] [address]    Culture   Final    NO GROWTH 5 DAYS Performed at [facility], [address], [place], [place] [address]    Report Status 02/11/2023 FINAL  Final  Expectorated Sputum Assessment w Gram Stain, Rflx to Resp Cult     Status: None   Collection Time: 02/06/23  4:45 PM   Specimen: Expectorated Sputum  Result Value Ref Range Status   Specimen Description EXPECTORATED SPUTUM  Final   Special Requests NONE  Final   Sputum evaluation   Final    THIS SPECIMEN IS ACCEPTABLE FOR SPUTUM CULTURE Performed at [facility], [address], [place], [place] [address]    Report Status 02/06/2023 FINAL  Final  Culture, Respiratory w Gram Stain     Status: None   Collection Time: 02/06/23  4:45 PM  Result Value Ref Range Status   Specimen Description   Final    EXPECTORATED SPUTUM Performed at [facility], [address] [place], [place] [address]    Special Requests   Final    NONE Reflexed from [id number] Performed at [facility], [address], [place], [place] [address]    Gram Stain    Final    ABUNDANT WBC PRESENT, PREDOMINANTLY PMN MODERATE GRAM POSITIVE COCCI IN CHAINS IN CLUSTERS    Culture   Final    MODERATE Normal respiratory flora-no Staph aureus or Pseudomonas seen Performed at [facility], [address], [place], [place] [address]    Report Status  02/08/2023 FINAL  Final         Radiology Studies: No results found.      Scheduled Meds:  acetaminophen  1,000 mg Oral QID   amLODipine  10 mg Oral Daily   aspirin EC  81 mg Oral Daily   carvedilol  6.25 mg Oral BID WC   enoxaparin (LOVENOX) injection  40 mg Subcutaneous Q24H   ezetimibe  10 mg Oral Daily   famotidine  40 mg Oral QHS   hydrALAZINE  25 mg Oral Q8H   ibuprofen  600 mg Oral TID   lidocaine  2 patch Transdermal Q24H   methocarbamol  1,000 mg Oral QID   montelukast  10 mg Oral QHS   pantoprazole  40 mg Oral Daily   pravastatin  40 mg Oral q1800   Continuous Infusions:  ampicillin-sulbactam (UNASYN) IV 3 g (02/13/23 1037)   dextrose 5% lactated ringers 50 mL/hr at 02/11/23 1703     LOS: 10 days    Time spent: 39 min  Alwyn Ren, MD  02/13/2023, 12:57 PM

## 2023-02-13 NOTE — Progress Notes (Signed)
9 Days Post-Op   Subjective/Chief Complaint: NAEO. Bms slowing down and becoming more formed - reports 3-4 BMs daily. She ate almost 100% of breakfast.    Objective: Vital signs in last 24 hours: Temp:  [97.8 F (36.6 C)-98 F (36.7 C)] 97.8 F (36.6 C) (10/02 0620) Pulse Rate:  [74-81] 78 (10/02 0620) Resp:  [18] 18 (10/02 0620) BP: (147-156)/(77-78) 147/78 (10/02 0620) SpO2:  [92 %-95 %] 95 % (10/02 0620) Last BM Date : 02/13/23  Intake/Output from previous day: 10/01 0701 - 10/02 0700 In: 1611.7 [P.O.:960; I.V.:451.7; IV Piggyback:200] Out: 1300 [Urine:1300] Intake/Output this shift: No intake/output data recorded.  General appearance: alert and cooperative, on commode GI: soft, abd binder removed, midline incision as below with fascial dehiscence without evisceration. Drainge of serous fluid.   Lab Results:  Recent Labs    02/11/23 0417 02/12/23 0447  WBC 12.2* 12.1*  HGB 10.2* 10.4*  HCT 32.0* 33.0*  PLT 296 367   BMET Recent Labs    02/11/23 0417 02/12/23 0447  NA 138 135  K 3.0* 3.3*  CL 107 106  CO2 23 22  GLUCOSE 108* 96  BUN 13 12  CREATININE 0.67 0.78  CALCIUM 8.8* 8.8*   PT/INR No results for input(s): "LABPROT", "INR" in the last 72 hours. ABG No results for input(s): "PHART", "HCO3" in the last 72 hours.  Invalid input(s): "PCO2", "PO2"  Studies/Results: No results found.  Anti-infectives: Anti-infectives (From admission, onward)    Start     Dose/Rate Route Frequency Ordered Stop   02/08/23 1000  azithromycin (ZITHROMAX) tablet 250 mg       Placed in "Followed by" Linked Group   250 mg Oral Daily 02/07/23 0912 02/11/23 0907   02/07/23 1200  Ampicillin-Sulbactam (UNASYN) 3 g in sodium chloride 0.9 % 100 mL IVPB        3 g 200 mL/hr over 30 Minutes Intravenous Every 6 hours 02/07/23 0946     02/07/23 1000  azithromycin (ZITHROMAX) tablet 500 mg       Placed in "Followed by" Linked Group   500 mg Oral Daily 02/07/23 0912 02/07/23  0932   02/04/23 0930  cefOXitin (MEFOXIN) 2 g in sodium chloride 0.9 % 100 mL IVPB        2 g 200 mL/hr over 30 Minutes Intravenous  Once 02/04/23 0844 02/04/23 1011       Assessment/Plan: SBO  S/p exploratory laparotomy, small bowel resection 9/23 DB POD#9 AFVSS  Surgical pathology: 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  She will need outpatient oncology follow up    - soft diet - staples removed due to wound infection. continue moist to dry dressing to midline abdominal wound. Continue abdominal binder. Monitor wound closely. - abx for HCAP per primary team (Unasyn day #7) -GERD meds - daily lovenox for DVT ppx - patient has been approved for short term SNF. Would like to monitor her wound for at least one more day.     LOS: 10 days    Adam Phenix 02/13/2023

## 2023-02-13 NOTE — TOC Progression Note (Signed)
Transition of Care [facility]) - Progression Note    Patient Details  Name: Kayla Lowe MRN: [id number] Date of Birth: 04-27-1956  Transition of Care [facility]) CM/SW Contact  Amada Jupiter, LCSW Phone Number: 02/13/2023, 1:48 PM  Clinical Narrative:     Have reviewed SNF bed offers with pt and she has accepted bed at [facility].  (Ins auth# [id number] good 10/2-10/4).  Medical team hopeful she will be ready for dc tomorrow.  Have explained to pt that she will need to coordinate someone to transport her to facility as she does not meet criteria for ambulance transport and she is agreed to work on this.  Will alert TOC coverage tomorrow to please touch base with facility in the morning and follow up with pt on transportation.         Expected Discharge Plan and Services                                               Social Determinants of Health (SDOH) Interventions SDOH Screenings   Food Insecurity: No Food Insecurity (02/03/2023)  Housing: Low Risk  (02/03/2023)  Transportation Needs: No Transportation Needs (02/03/2023)  Utilities: Not At Risk (02/03/2023)  Alcohol Screen: Low Risk  (03/22/2022)  Depression (PHQ2-9): Low Risk  (03/22/2022)  Financial Resource Strain: Low Risk  (03/22/2022)  Physical Activity: Inactive (03/22/2022)  Social Connections: Moderately Integrated (03/22/2022)  Stress: No Stress Concern Present (03/22/2022)  Tobacco Use: Low Risk  (02/04/2023)    Readmission Risk Interventions    02/05/2023    2:50 PM  Readmission Risk Prevention Plan  Post Dischage Appt Complete  Medication Screening Complete  Transportation Screening Complete

## 2023-02-13 NOTE — Progress Notes (Signed)
Mobility Specialist - Progress Note   02/13/23 1323  Mobility  Activity Ambulated with assistance in hallway  Level of Assistance Standby assist, set-up cues, supervision of patient - no hands on  Assistive Device Front wheel walker  Distance Ambulated (ft) 315 ft  Activity Response Tolerated well  Mobility Referral Yes  $Mobility charge 1 Mobility  Mobility Specialist Start Time (ACUTE ONLY) 0107  Mobility Specialist Stop Time (ACUTE ONLY) 0121  Mobility Specialist Time Calculation (min) (ACUTE ONLY) 14 min   Pt received in bed and agreeable to mobility. No complaints during session. Pt to EOB for meal after session with all needs met.    [facility]

## 2023-02-14 DIAGNOSIS — K56609 Unspecified intestinal obstruction, unspecified as to partial versus complete obstruction: Secondary | ICD-10-CM | POA: Diagnosis not present

## 2023-02-14 LAB — CBC
HCT: 32.2 % — ABNORMAL LOW (ref 36.0–46.0)
Hemoglobin: 9.9 g/dL — ABNORMAL LOW (ref 12.0–15.0)
MCH: 30.7 pg (ref 26.0–34.0)
MCHC: 30.7 g/dL (ref 30.0–36.0)
MCV: 100 fL (ref 80.0–100.0)
Platelets: 453 10*3/uL — ABNORMAL HIGH (ref 150–400)
RBC: 3.22 MIL/uL — ABNORMAL LOW (ref 3.87–5.11)
RDW: 14.6 % (ref 11.5–15.5)
WBC: 11.9 10*3/uL — ABNORMAL HIGH (ref 4.0–10.5)
nRBC: 0 % (ref 0.0–0.2)

## 2023-02-14 LAB — COMPREHENSIVE METABOLIC PANEL
ALT: 66 U/L — ABNORMAL HIGH (ref 0–44)
AST: 23 U/L (ref 15–41)
Albumin: 2.3 g/dL — ABNORMAL LOW (ref 3.5–5.0)
Alkaline Phosphatase: 85 U/L (ref 38–126)
Anion gap: 8 (ref 5–15)
BUN: 10 mg/dL (ref 8–23)
CO2: 22 mmol/L (ref 22–32)
Calcium: 8.4 mg/dL — ABNORMAL LOW (ref 8.9–10.3)
Chloride: 106 mmol/L (ref 98–111)
Creatinine, Ser: 0.69 mg/dL (ref 0.44–1.00)
GFR, Estimated: >60 mL/min (ref >60)
Glucose, Bld: 88 mg/dL (ref 70–99)
Potassium: 3.3 mmol/L — ABNORMAL LOW (ref 3.5–5.1)
Sodium: 136 mmol/L (ref 135–145)
Total Bilirubin: 0.3 mg/dL (ref 0.3–1.2)
Total Protein: 5.3 g/dL — ABNORMAL LOW (ref 6.5–8.1)

## 2023-02-14 MED ORDER — POTASSIUM CHLORIDE CRYS ER 20 MEQ PO TBCR
40.0000 meq | EXTENDED_RELEASE_TABLET | Freq: Every day | ORAL | Status: DC
  Administered 2023-02-14 – 2023-02-15 (×2): 40 meq via ORAL
  Filled 2023-02-14 (×2): qty 2

## 2023-02-14 NOTE — Progress Notes (Signed)
PT Cancellation Note  Patient Details Name: Camila Maita MRN: [id number] DOB: 1955-10-24   Cancelled Treatment:    Reason Eval/Treat Not Completed: Other (comment) just got lunch, requested PT return in a bit. Helped her get from the bathroom back to EOB to eat, will return if time/schedule allow.   Nedra Hai, PT, DPT 02/14/23 1:58 PM

## 2023-02-14 NOTE — Progress Notes (Addendum)
10 Days Post-Op   Subjective/Chief Complaint: Reports diarrhea from 3-5 AM. Tolerating PO. No other acute changes   Objective: Vital signs in last 24 hours: Temp:  [98.2 F (36.8 C)-98.8 F (37.1 C)] 98.8 F (37.1 C) (10/03 0523) Pulse Rate:  [79-86] 85 (10/03 0523) Resp:  [18] 18 (10/03 0523) BP: (139-166)/(71-82) 152/71 (10/03 0523) SpO2:  [96 %-100 %] 100 % (10/03 0523) Last BM Date : 02/13/23  Intake/Output from previous day: 10/02 0701 - 10/03 0700 In: 1600 [P.O.:1200; IV Piggyback:400] Out: 1000 [Urine:1000] Intake/Output this shift: No intake/output data recorded.  General appearance: alert and cooperative, on commode GI: soft, abd binder removed, midline wound stable w/ partial dehiscence    Lab Results:  Recent Labs    02/13/23 1405 02/14/23 0427  WBC 12.0* 11.9*  HGB 10.8* 9.9*  HCT 35.0* 32.2*  PLT 445* 453*   BMET Recent Labs    02/13/23 1405 02/14/23 0427  NA 137 136  K 3.5 3.3*  CL 107 106  CO2 23 22  GLUCOSE 89 88  BUN 12 10  CREATININE 0.76 0.69  CALCIUM 8.7* 8.4*   PT/INR No results for input(s): "LABPROT", "INR" in the last 72 hours. ABG No results for input(s): "PHART", "HCO3" in the last 72 hours.  Invalid input(s): "PCO2", "PO2"  Studies/Results: No results found.  Anti-infectives: Anti-infectives (From admission, onward)    Start     Dose/Rate Route Frequency Ordered Stop   02/08/23 1000  azithromycin (ZITHROMAX) tablet 250 mg       Placed in "Followed by" Linked Group   250 mg Oral Daily 02/07/23 0912 02/11/23 0907   02/07/23 1200  Ampicillin-Sulbactam (UNASYN) 3 g in sodium chloride 0.9 % 100 mL IVPB        3 g 200 mL/hr over 30 Minutes Intravenous Every 6 hours 02/07/23 0946     02/07/23 1000  azithromycin (ZITHROMAX) tablet 500 mg       Placed in "Followed by" Linked Group   500 mg Oral Daily 02/07/23 0912 02/07/23 0932   02/04/23 0930  cefOXitin (MEFOXIN) 2 g in sodium chloride 0.9 % 100 mL IVPB        2 g 200  mL/hr over 30 Minutes Intravenous  Once 02/04/23 0844 02/04/23 1011       Assessment/Plan: SBO  S/p exploratory laparotomy, small bowel resection 9/23 DB POD#10 AFVSS  Surgical pathology: 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  She will need outpatient oncology follow up    - soft diet - staples removed due to wound infection. Mepitel ordered for midline wound. continue moist to dry dressing to midline abdominal wound. Continue abdominal binder at all times unless laying flat in bed. Monitor wound closely. - abx for HCAP per primary team (Unasyn day #8) - ok to d/c abx from my standpoint. -GERD meds - daily lovenox for DVT ppx - patient has been approved for short term SNF. Ok to discharge to SNF tomorrow 10/4 if her wound appears stable in AM.    LOS: 11 days    Adam Phenix 02/14/2023

## 2023-02-14 NOTE — TOC Progression Note (Signed)
Transition of Care [facility]) - Progression Note    Patient Details  Name: Nataliee Shurtz MRN: [id number] Date of Birth: 30-Dec-1955  Transition of Care [facility]) CM/SW Contact  Adrian Prows, RN Phone Number: 02/14/2023, 12:09 PM  Clinical Narrative:    Sherron Monday w/ Dr Jerolyn Center regarding d/c to [facility] today; she says pt is not medically ready today; spoke w/ Lowella Bandy at [facility]; she says  bed not available today; she also says bed will likely become available tomorrow.        Expected Discharge Plan and Services                                               Social Determinants of Health (SDOH) Interventions SDOH Screenings   Food Insecurity: No Food Insecurity (02/03/2023)  Housing: Low Risk  (02/03/2023)  Transportation Needs: No Transportation Needs (02/03/2023)  Utilities: Not At Risk (02/03/2023)  Alcohol Screen: Low Risk  (03/22/2022)  Depression (PHQ2-9): Low Risk  (03/22/2022)  Financial Resource Strain: Low Risk  (03/22/2022)  Physical Activity: Inactive (03/22/2022)  Social Connections: Moderately Integrated (03/22/2022)  Stress: No Stress Concern Present (03/22/2022)  Tobacco Use: Low Risk  (02/04/2023)    Readmission Risk Interventions    02/05/2023    2:50 PM  Readmission Risk Prevention Plan  Post Dischage Appt Complete  Medication Screening Complete  Transportation Screening Complete

## 2023-02-14 NOTE — Progress Notes (Signed)
PROGRESS NOTE    Kayla Lowe  ZOX:[id number] DOB: Feb 12, 1956 DOA: 02/03/2023 PCP: Etta Grandchild, MD   Brief Narrative: 67 y.o. female with medical history significant of GERD, essential hypertension, hyperlipidemia, osteoarthritis, history of total abdominal hysterectomy in 1987 who presents to the ER with nausea and pain is in the right and left upper quadrants.  Associated with diarrhea for last 2 days.  CT abdomen pelvis showed signs of small bowel obstruction.  Seen by general surgery, status post exploratory laparotomy, small bowel resection on 02/04/2023.  Reports multiple bowel movements.  She has been up and walking.  Concern for pneumonia seen on chest x-ray done on 02/06/2023, was started on Unasyn and Z-Pak empirically. 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  Seen by medical oncology.  General surgery following.  Assessment & Plan:   Principal Problem:   SBO (small bowel obstruction) (HCC) Active Problems:   Dyslipidemia, goal LDL below 130   Essential hypertension, benign   Gastroesophageal reflux disease without esophagitis   Stage 3a chronic kidney disease (HCC)   Small bowel obstruction secondary to small bowel mass, POA, status post exploratory laparotomy, on 02/04/2023.  Staples were removed 02/14/2023 due to wound infection.  Wound care per general surgery.  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 Seen by medical oncology, will need further staging and surveillance.-Order PET scan to assess for disease elsewhere, can be done outpt Perform unilateral bone marrow biopsy for accurate staging, can be done outpt Consider single agent Rituximab in adjuvant setting or at disease progression.  Hep B and C panel non reactive   Newly diagnosed B-cell lymphoma/marginal zone lymphoma, seen on  pathology Seen by medical oncology Dr. Al Pimple Appreciate oncology's recommendations   Right lower lobe pneumonia, POA-received 8 days of Unasyn stopped on 02/14/2023 Patient on room air.   Hypertension on norvasc 10 mg hydralazine and coreg   Hyperlipidemia-continue Pravachol and Zetia   Asthma Continue home bronchodilator. Continue home regimen.   Obesity BMI 34 Recommend weight loss outpatient regular physical activity and healthy dieting.    Hypokalemia k 3.3 replete  Physical debility Continue PT OT Fall precautions   Bilateral lower extremity edema, symmetrical improving   Resolved AKI, prerenal in the setting of dehydration Baseline creatinine 0.67 with GFR greater than 60 Presented with creatinine of 1.17 with GFR of 51 Continue to avoid nephrotoxic agents, dehydration, and hypotension.    Estimated body mass index is 34.63 kg/m as calculated from the following:   Height as of this encounter: 5\' 4"  (1.626 m).   Weight as of this encounter: 91.5 kg.  DVT prophylaxis: Lovenox  code Status:full  Code  family Communication: none Disposition Plan:  Status is: Inpatient Consultants:  General surgery and oncology  Procedures: ex laparotomy Antimicrobials: Anti-infectives (From admission, onward)    Start     Dose/Rate Route Frequency Ordered Stop   02/08/23 1000  azithromycin (ZITHROMAX) tablet 250 mg       Placed in "Followed by" Linked Group   250 mg Oral Daily 02/07/23 0912 02/11/23 0907   02/07/23 1200  Ampicillin-Sulbactam (UNASYN) 3 g in sodium chloride 0.9 % 100 mL IVPB        3 g 200 mL/hr over 30 Minutes Intravenous Every 6 hours 02/07/23 0946     02/07/23 1000  azithromycin (ZITHROMAX) tablet 500 mg       Placed in "Followed  by" Linked Group   500 mg Oral Daily 02/07/23 0912 02/07/23 0932   02/04/23 0930  cefOXitin (MEFOXIN) 2 g in sodium chloride 0.9 % 100 mL IVPB        2 g 200 mL/hr over 30 Minutes Intravenous  Once 02/04/23 0844 02/04/23 1011         Subjective:  Had diarrhea multiple times  Feels weak Foul smell noted  Objective: Vitals:   02/13/23 0620 02/13/23 1424 02/13/23 2010 02/14/23 0523  BP: (!) 147/78 139/73 (!) 166/82 (!) 152/71  Pulse: 78 86 79 85  Resp: 18 18 18 18   Temp: 97.8 F (36.6 C) 98.2 F (36.8 C) 98.6 F (37 C) 98.8 F (37.1 C)  TempSrc: Oral Oral Oral Oral  SpO2: 95% 96% 100% 100%  Weight:      Height:        Intake/Output Summary (Last 24 hours) at 02/14/2023 1014 Last data filed at 02/14/2023 1000 Gross per 24 hour  Intake 1360 ml  Output 1150 ml  Net 210 ml   Filed Weights   02/03/23 1351 02/04/23 1335  Weight: 91.5 kg 91.5 kg    Examination:  General exam: Appears in no acute distress  respiratory system: Clear to auscultation. Respiratory effort normal. Cardiovascular system: S1 & S2 heard, RRR. No JVD, murmurs, rubs, gallops or clicks. No pedal edema. Gastrointestinal system: Abdomen is distended, soft and tender. No organomegaly or masses felt. Normal bowel sounds heard.  Picture noted from surgery notes Central nervous system: Alert and oriented. Extremities: Symmetric 5 x 5 power.  Data Reviewed: I have personally reviewed following labs and imaging studies  CBC: Recent Labs  Lab 02/09/23 0839 02/11/23 0417 02/12/23 0447 02/13/23 1405 02/14/23 0427  WBC 12.7* 12.2* 12.1* 12.0* 11.9*  HGB 12.0 10.2* 10.4* 10.8* 9.9*  HCT 38.1 32.0* 33.0* 35.0* 32.2*  MCV 95.3 97.0 95.7 99.7 100.0  PLT 321 296 367 445* 453*   Basic Metabolic Panel: Recent Labs  Lab 02/08/23 0436 02/09/23 0839 02/11/23 0417 02/12/23 0447 02/13/23 1405 02/14/23 0427  NA 143 141 138 135 137 136  K 3.2* 3.2* 3.0* 3.3* 3.5 3.3*  CL 108 106 107 106 107 106  CO2 26 24 23 22 23 22   GLUCOSE 118* 116* 108* 96 89 88  BUN 12 10 13 12 12 10   CREATININE 0.60 0.60 0.67 0.78 0.76 0.69  CALCIUM 9.6 10.0 8.8* 8.8* 8.7* 8.4*  MG 2.3  --  2.2  --  2.2  --    GFR: Estimated Creatinine Clearance:  72.7 mL/min (by C-G formula based on SCr of 0.69 mg/dL). Liver Function Tests: Recent Labs  Lab 02/12/23 0447 02/14/23 0427  AST 42* 23  ALT 138* 66*  ALKPHOS 91 85  BILITOT 0.8 0.3  PROT 5.9* 5.3*  ALBUMIN 2.4* 2.3*   No results for input(s): "LIPASE", "AMYLASE" in the last 168 hours. No results for input(s): "AMMONIA" in the last 168 hours. Coagulation Profile: No results for input(s): "INR", "PROTIME" in the last 168 hours. Cardiac Enzymes: No results for input(s): "CKTOTAL", "CKMB", "CKMBINDEX", "TROPONINI" in the last 168 hours. BNP (last 3 results) No results for input(s): "PROBNP" in the last 8760 hours. HbA1C: No results for input(s): "HGBA1C" in the last 72 hours. CBG: No results for input(s): "GLUCAP" in the last 168 hours. Lipid Profile: No results for input(s): "CHOL", "HDL", "LDLCALC", "TRIG", "CHOLHDL", "LDLDIRECT" in the last 72 hours. Thyroid Function Tests: No results for input(s): "TSH", "T4TOTAL", "FREET4", "T3FREE", "THYROIDAB"  in the last 72 hours. Anemia Panel: No results for input(s): "VITAMINB12", "FOLATE", "FERRITIN", "TIBC", "IRON", "RETICCTPCT" in the last 72 hours. Sepsis Labs: Recent Labs  Lab 02/12/23 0447  PROCALCITON 0.10    Recent Results (from the past 240 hour(s))  Culture, blood (Routine X 2) w Reflex to ID Panel     Status: None   Collection Time: 02/06/23  3:43 PM   Specimen: BLOOD LEFT ARM  Result Value Ref Range Status   Specimen Description   Final    BLOOD LEFT ARM Performed at [facility], [address], [place], [place] [address]    Special Requests   Final    BOTTLES DRAWN AEROBIC AND ANAEROBIC Blood Culture adequate volume Performed at [facility], [address] [place], [place] [address]    Culture   Final    NO GROWTH 5 DAYS Performed at [facility], [address], [place], [place] [address]    Report Status 02/11/2023 FINAL  Final  Culture, blood (Routine X 2) w Reflex to ID  Panel     Status: None   Collection Time: 02/06/23  3:47 PM   Specimen: BLOOD LEFT ARM  Result Value Ref Range Status   Specimen Description   Final    BLOOD LEFT ARM Performed at [facility], [address], [place], [place] [address]    Special Requests   Final    BOTTLES DRAWN AEROBIC AND ANAEROBIC Blood Culture adequate volume Performed at [facility], [address] [place], [place] [address]    Culture   Final    NO GROWTH 5 DAYS Performed at [facility], [address] [place], [place] [address]    Report Status 02/11/2023 FINAL  Final  Expectorated Sputum Assessment w Gram Stain, Rflx to Resp Cult     Status: None   Collection Time: 02/06/23  4:45 PM   Specimen: Expectorated Sputum  Result Value Ref Range Status   Specimen Description EXPECTORATED SPUTUM  Final   Special Requests NONE  Final   Sputum evaluation   Final    THIS SPECIMEN IS ACCEPTABLE FOR SPUTUM CULTURE Performed at [facility], [address], [place], [place] [address]    Report Status 02/06/2023 FINAL  Final  Culture, Respiratory w Gram Stain     Status: None   Collection Time: 02/06/23  4:45 PM  Result Value Ref Range Status   Specimen Description   Final    EXPECTORATED SPUTUM Performed at [facility], [address] [place], [place] [address]    Special Requests   Final    NONE Reflexed from [id number] Performed at [facility], [address], [place], [place] [address]    Gram Stain   Final    ABUNDANT WBC PRESENT, PREDOMINANTLY PMN MODERATE GRAM POSITIVE COCCI IN CHAINS IN CLUSTERS    Culture   Final    MODERATE Normal respiratory flora-no Staph aureus or Pseudomonas seen Performed at [facility], [address], [place], [place] [address]    Report Status 02/08/2023 FINAL  Final         Radiology Studies: No results found.      Scheduled Meds:  acetaminophen  1,000 mg Oral  QID   amLODipine  10 mg Oral Daily   aspirin EC  81 mg Oral Daily   carvedilol  6.25 mg Oral BID WC   enoxaparin (LOVENOX)  injection  40 mg Subcutaneous Q24H   ezetimibe  10 mg Oral Daily   famotidine  40 mg Oral QHS   hydrALAZINE  25 mg Oral Q8H   ibuprofen  600 mg Oral TID   lidocaine  2 patch Transdermal Q24H   methocarbamol  1,000 mg Oral QID   montelukast  10 mg Oral QHS   pantoprazole  40 mg Oral Daily   potassium chloride  40 mEq Oral Daily   pravastatin  40 mg Oral q1800   Continuous Infusions:  ampicillin-sulbactam (UNASYN) IV 3 g (02/14/23 0525)     LOS: 11 days    Time spent: 39 min  Alwyn Ren, MD  02/14/2023, 10:14 AM

## 2023-02-14 NOTE — Progress Notes (Signed)
Physical Therapy Treatment Patient Details Name: Kayla Lowe MRN: [id number] DOB: 1956/01/20 Today's Date: 02/14/2023   History of Present Illness Patient is a 67 year old female who presented on 9/22 with pain in right and left upper quadrants nausea and diarrhea. Patient was found to have small bowel obstruction. Patient underwent ex lap and small bowel resection on 9/23. 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 PMH: asthma, obesity, hypokalemia, HTN    PT Comments  Pt received sitting at EOB, pleasant and cooperative with PT. Able to progress gait distance considerably, but fatigued- just generally deconditioned following surgical procedures. Left sitting at EOB with RN aware of request for pain medication at [facility]. Hopeful for DC tomorrow if medically ready per MD.     If plan is discharge home, recommend the following: A little help with walking and/or transfers;A little help with bathing/dressing/bathroom;Help with stairs or ramp for entrance;Assist for transportation;Assistance with cooking/housework   Can travel by private vehicle     Yes  Equipment Recommendations  Rolling walker (2 wheels)    Recommendations for Other Services       Precautions / Restrictions Precautions Precautions: Other (comment) Precaution Comments: abdominal wound Required Braces or Orthoses: Other Brace Other Brace: ABD binder Restrictions Weight Bearing Restrictions: No     Mobility  Bed Mobility               General bed mobility comments: sitting up at EOB upon PT entry    Transfers Overall transfer level: Modified independent Equipment used: None Transfers: Sit to/from Stand             General transfer comment: safe and steady with transfers    Ambulation/Gait Ambulation/Gait assistance: Supervision Gait Distance (Feet): 200 Feet Assistive device: None Gait Pattern/deviations: Step-through pattern, Antalgic, Trunk flexed,  Drifts right/left Gait velocity: decr     General Gait Details: still quite slow with ambulation, easily fatigued with increased WOB, some SOB noted- generally deconditioned   Stairs             Wheelchair Mobility     Tilt Bed    Modified Rankin (Stroke Patients Only)       Balance Overall balance assessment: Mild deficits observed, not formally tested                                          Cognition Arousal: Alert Behavior During Therapy: WFL for tasks assessed/performed, Flat affect Overall Cognitive Status: Within Functional Limits for tasks assessed                                          Exercises      General Comments        Pertinent Vitals/Pain Pain Assessment Pain Assessment: Faces Faces Pain Scale: Hurts a little bit Pain Location: abdomen Pain Descriptors / Indicators: Aching, Discomfort Pain Intervention(s): Limited activity within patient's tolerance, Monitored during session    Home Living                          Prior Function            PT Goals (current goals can now be found in the care plan section) Acute Rehab PT Goals Patient Stated  Goal: to be able to go home and take care of self PT Goal Formulation: With patient Time For Goal Achievement: 02/26/23 Potential to Achieve Goals: Good Progress towards PT goals: Progressing toward goals    Frequency    Min 1X/week      PT Plan      Co-evaluation              AM-PAC PT "6 Clicks" Mobility   Outcome Measure  Help needed turning from your back to your side while in a flat bed without using bedrails?: None Help needed moving from lying on your back to sitting on the side of a flat bed without using bedrails?: None Help needed moving to and from a bed to a chair (including a wheelchair)?: A Little Help needed standing up from a chair using your arms (e.g., wheelchair or bedside chair)?: None Help needed to walk in  hospital room?: A Little Help needed climbing 3-5 steps with a railing? : A Little 6 Click Score: 21    End of Session   Activity Tolerance: Patient tolerated treatment well Patient left: in bed;with call bell/phone within reach (sitting at EOB) Nurse Communication: Mobility status PT Visit Diagnosis: Unsteadiness on feet (R26.81);Other abnormalities of gait and mobility (R26.89);Difficulty in walking, not elsewhere classified (R26.2);Pain     Time: 6010-9323 PT Time Calculation (min) (ACUTE ONLY): 10 min  Charges:    $Gait Training: 8-22 mins PT General Charges $$ ACUTE PT VISIT: 1 Visit                    Nedra Hai, PT, DPT 02/14/23 2:46 PM

## 2023-02-15 DIAGNOSIS — R1310 Dysphagia, unspecified: Secondary | ICD-10-CM | POA: Diagnosis not present

## 2023-02-15 DIAGNOSIS — R2681 Unsteadiness on feet: Secondary | ICD-10-CM | POA: Diagnosis not present

## 2023-02-15 DIAGNOSIS — C8333 Diffuse large B-cell lymphoma, intra-abdominal lymph nodes: Secondary | ICD-10-CM | POA: Diagnosis not present

## 2023-02-15 DIAGNOSIS — M1990 Unspecified osteoarthritis, unspecified site: Secondary | ICD-10-CM | POA: Diagnosis not present

## 2023-02-15 DIAGNOSIS — L2084 Intrinsic (allergic) eczema: Secondary | ICD-10-CM | POA: Diagnosis not present

## 2023-02-15 DIAGNOSIS — C8309 Small cell B-cell lymphoma, extranodal and solid organ sites: Secondary | ICD-10-CM | POA: Diagnosis not present

## 2023-02-15 DIAGNOSIS — I129 Hypertensive chronic kidney disease with stage 1 through stage 4 chronic kidney disease, or unspecified chronic kidney disease: Secondary | ICD-10-CM | POA: Diagnosis not present

## 2023-02-15 DIAGNOSIS — Z48815 Encounter for surgical aftercare following surgery on the digestive system: Secondary | ICD-10-CM | POA: Diagnosis not present

## 2023-02-15 DIAGNOSIS — D649 Anemia, unspecified: Secondary | ICD-10-CM | POA: Diagnosis not present

## 2023-02-15 DIAGNOSIS — C8510 Unspecified B-cell lymphoma, unspecified site: Secondary | ICD-10-CM | POA: Diagnosis not present

## 2023-02-15 DIAGNOSIS — K219 Gastro-esophageal reflux disease without esophagitis: Secondary | ICD-10-CM | POA: Diagnosis not present

## 2023-02-15 DIAGNOSIS — I10 Essential (primary) hypertension: Secondary | ICD-10-CM | POA: Diagnosis not present

## 2023-02-15 DIAGNOSIS — E559 Vitamin D deficiency, unspecified: Secondary | ICD-10-CM | POA: Diagnosis not present

## 2023-02-15 DIAGNOSIS — E785 Hyperlipidemia, unspecified: Secondary | ICD-10-CM | POA: Diagnosis not present

## 2023-02-15 DIAGNOSIS — I119 Hypertensive heart disease without heart failure: Secondary | ICD-10-CM | POA: Diagnosis not present

## 2023-02-15 DIAGNOSIS — M85851 Other specified disorders of bone density and structure, right thigh: Secondary | ICD-10-CM | POA: Diagnosis not present

## 2023-02-15 DIAGNOSIS — M810 Age-related osteoporosis without current pathological fracture: Secondary | ICD-10-CM | POA: Diagnosis not present

## 2023-02-15 DIAGNOSIS — N1831 Chronic kidney disease, stage 3a: Secondary | ICD-10-CM | POA: Diagnosis not present

## 2023-02-15 DIAGNOSIS — Z809 Family history of malignant neoplasm, unspecified: Secondary | ICD-10-CM | POA: Diagnosis not present

## 2023-02-15 DIAGNOSIS — K56609 Unspecified intestinal obstruction, unspecified as to partial versus complete obstruction: Secondary | ICD-10-CM | POA: Diagnosis not present

## 2023-02-15 DIAGNOSIS — R2689 Other abnormalities of gait and mobility: Secondary | ICD-10-CM | POA: Diagnosis not present

## 2023-02-15 DIAGNOSIS — M6281 Muscle weakness (generalized): Secondary | ICD-10-CM | POA: Diagnosis not present

## 2023-02-15 DIAGNOSIS — T8189XA Other complications of procedures, not elsewhere classified, initial encounter: Secondary | ICD-10-CM | POA: Diagnosis not present

## 2023-02-15 DIAGNOSIS — J4520 Mild intermittent asthma, uncomplicated: Secondary | ICD-10-CM | POA: Diagnosis not present

## 2023-02-15 DIAGNOSIS — C8580 Other specified types of non-Hodgkin lymphoma, unspecified site: Secondary | ICD-10-CM | POA: Diagnosis not present

## 2023-02-15 LAB — CBC
HCT: 30.3 % — ABNORMAL LOW (ref 36.0–46.0)
Hemoglobin: 9.8 g/dL — ABNORMAL LOW (ref 12.0–15.0)
MCH: 31.4 pg (ref 26.0–34.0)
MCHC: 32.3 g/dL (ref 30.0–36.0)
MCV: 97.1 fL (ref 80.0–100.0)
Platelets: 514 10*3/uL — ABNORMAL HIGH (ref 150–400)
RBC: 3.12 MIL/uL — ABNORMAL LOW (ref 3.87–5.11)
RDW: 15.2 % (ref 11.5–15.5)
WBC: 9.3 10*3/uL (ref 4.0–10.5)
nRBC: 0 % (ref 0.0–0.2)

## 2023-02-15 LAB — BASIC METABOLIC PANEL
Anion gap: 7 (ref 5–15)
BUN: 11 mg/dL (ref 8–23)
CO2: 22 mmol/L (ref 22–32)
Calcium: 8.6 mg/dL — ABNORMAL LOW (ref 8.9–10.3)
Chloride: 108 mmol/L (ref 98–111)
Creatinine, Ser: 0.63 mg/dL (ref 0.44–1.00)
GFR, Estimated: >60 mL/min (ref >60)
Glucose, Bld: 99 mg/dL (ref 70–99)
Potassium: 3.3 mmol/L — ABNORMAL LOW (ref 3.5–5.1)
Sodium: 137 mmol/L (ref 135–145)

## 2023-02-15 MED ORDER — POLYETHYLENE GLYCOL 3350 17 G PO PACK: 17.0000 g | PACK | Freq: Every day | ORAL | 0 refills | Status: DC | PRN

## 2023-02-15 MED ORDER — ACETAMINOPHEN 500 MG PO TABS: 1000.0000 mg | ORAL_TABLET | Freq: Four times a day (QID) | ORAL | 0 refills | Status: DC

## 2023-02-15 MED ORDER — TRAMADOL HCL 50 MG PO TABS: 50.0000 mg | ORAL_TABLET | Freq: Four times a day (QID) | ORAL | 0 refills | Status: DC | PRN

## 2023-02-15 MED ORDER — POTASSIUM CHLORIDE CRYS ER 20 MEQ PO TBCR: 40.0000 meq | EXTENDED_RELEASE_TABLET | Freq: Every day | ORAL | 0 refills | Status: DC

## 2023-02-15 MED ORDER — ONDANSETRON HCL 4 MG PO TABS: 4.0000 mg | ORAL_TABLET | Freq: Four times a day (QID) | ORAL | 0 refills | Status: DC | PRN

## 2023-02-15 NOTE — Progress Notes (Signed)
Called report to [employer], Charity fundraiser at [employer]. Pt d/c via personal vehicle to facility. Awaiting ride.

## 2023-02-15 NOTE — Progress Notes (Signed)
11 Days Post-Op  Subjective: Feeling well, eating well, diarrhea improved  ROS: See above, otherwise other systems negative  Objective: Vital signs in last 24 hours: Temp:  [98.4 F (36.9 C)-98.8 F (37.1 C)] 98.4 F (36.9 C) (10/04 1610) Pulse Rate:  [81-84] 81 (10/04 0613) Resp:  [16] 16 (10/04 0613) BP: (141-150)/(72-80) 150/72 (10/04 0613) SpO2:  [97 %-99 %] 99 % (10/04 0613) Last BM Date : 02/15/23  Intake/Output from previous day: 10/03 0701 - 10/04 0700 In: 1440 [P.O.:1440] Out: 1650 [Urine:1650] Intake/Output this shift: Total I/O In: 100 [P.O.:100] Out: 500 [Urine:500]  PE: Gen: NAD Resp: nonlabored CV: RRR Abd: soft, wound open, dehiscence with tissue bridging area, bowel less visible  Lab Results:  Recent Labs    02/14/23 0427 02/15/23 0428  WBC 11.9* 9.3  HGB 9.9* 9.8*  HCT 32.2* 30.3*  PLT 453* 514*   BMET Recent Labs    02/14/23 0427 02/15/23 0428  NA 136 137  K 3.3* 3.3*  CL 106 108  CO2 22 22  GLUCOSE 88 99  BUN 10 11  CREATININE 0.69 0.63  CALCIUM 8.4* 8.6*   PT/INR No results for input(s): "LABPROT", "INR" in the last 72 hours. CMP     Component Value Date/Time   NA 137 02/15/2023 0428   NA 145 (H) 07/29/2018 1112   K 3.3 (L) 02/15/2023 0428   CL 108 02/15/2023 0428   CO2 22 02/15/2023 0428   GLUCOSE 99 02/15/2023 0428   BUN 11 02/15/2023 0428   BUN 8 07/29/2018 1112   CREATININE 0.63 02/15/2023 0428   CREATININE 0.87 08/03/2015 0942   CALCIUM 8.6 (L) 02/15/2023 0428   PROT 5.3 (L) 02/14/2023 0427   PROT 7.0 07/29/2018 1112   ALBUMIN 2.3 (L) 02/14/2023 0427   ALBUMIN 4.2 07/29/2018 1112   AST 23 02/14/2023 0427   ALT 66 (H) 02/14/2023 0427   ALKPHOS 85 02/14/2023 0427   BILITOT 0.3 02/14/2023 0427   BILITOT 0.5 07/29/2018 1112   GFRNONAA >60 02/15/2023 0428   GFRNONAA 67 06/18/2014 1714   GFRAA 75 07/29/2018 1112   GFRAA 77 06/18/2014 1714   Lipase     Component Value Date/Time   LIPASE 29 02/03/2023  1516    Studies/Results: No results found.  Anti-infectives: Anti-infectives (From admission, onward)    Start     Dose/Rate Route Frequency Ordered Stop   02/08/23 1000  azithromycin (ZITHROMAX) tablet 250 mg       Placed in "Followed by" Linked Group   250 mg Oral Daily 02/07/23 0912 02/11/23 0907   02/07/23 1200  Ampicillin-Sulbactam (UNASYN) 3 g in sodium chloride 0.9 % 100 mL IVPB  Status:  Discontinued        3 g 200 mL/hr over 30 Minutes Intravenous Every 6 hours 02/07/23 0946 02/14/23 1020   02/07/23 1000  azithromycin (ZITHROMAX) tablet 500 mg       Placed in "Followed by" Linked Group   500 mg Oral Daily 02/07/23 0912 02/07/23 0932   02/04/23 0930  cefOXitin (MEFOXIN) 2 g in sodium chloride 0.9 % 100 mL IVPB        2 g 200 mL/hr over 30 Minutes Intravenous  Once 02/04/23 0844 02/04/23 1011       Assessment/Plan SBO  S/p exploratory laparotomy, small bowel resection 9/23 DB POD#11 AFVSS  Surgical pathology: 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  She will need outpatient oncology follow up  Ok to discharge Continue mepitel and moistened dressings to midline wound -follow up with Dr. Magnus Ivan 10/21  I reviewed last 24 h vitals and pain scores, last 48 h intake and output, last 24 h labs and trends, and last 24 h imaging results.  This care required high  level of medical decision making.    LOS: 12 days   [facility] 02/15/2023, 11:33 AM Please see Amion for pager number during day hours 7:00am-4:30pm or 7:00am -11:30am on weekends

## 2023-02-15 NOTE — Discharge Summary (Signed)
Physician Discharge Summary  Kayla Lowe ZOX:[id number] DOB: [birth year] DOA: 02/03/2023  PCP: Etta Grandchild, MD  Admit date: 02/03/2023 Discharge date: 02/15/2023  Admitted From: home Disposition: Nursing home with Recommendations for Outpatient Follow-up:  Follow up with PCP in 1-2 weeks Please obtain BMP/CBC in one week Please follow up with general surgery  Home Health:none Equipment/Devices:none  Discharge Condition:stable CODE STATUS:full Diet recommendation: cardiac  Brief/Interim Summary: 67 y.o. female with medical history significant of GERD, essential hypertension, hyperlipidemia, osteoarthritis, history of total abdominal hysterectomy in 1987 who presents to the ER with nausea and pain is in the right and left upper quadrants.  Associated with diarrhea for last 2 days.  CT abdomen pelvis showed signs of small bowel obstruction.  Seen by general surgery, status post exploratory laparotomy, small bowel resection on 02/04/2023.  Reports multiple bowel movements.  She has been up and walking.  Concern for pneumonia seen on chest x-ray done on 02/06/2023, was started on Unasyn and Z-Pak empirically. 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  Seen by medical oncology.  General surgery following. Discharge Diagnoses:  Principal Problem:   SBO (small bowel obstruction) (HCC) Active Problems:   Dyslipidemia, goal LDL below 130   Essential hypertension, benign   Gastroesophageal reflux disease without esophagitis   Stage 3a chronic kidney disease (HCC)  Small bowel obstruction secondary to small bowel mass, POA, status post exploratory laparotomy, on 02/04/2023.  Staples were removed 02/14/2023 due to wound infection.  Wound care per general surgery.  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 Seen by medical  oncology, will need further staging and surveillance.-Order PET scan to assess for disease elsewhere, can be done outpt Perform unilateral bone marrow biopsy for accurate staging, can be done outpt Consider single agent Rituximab in adjuvant setting or at disease progression.  Hep B and C panel non reactive   Newly diagnosed B-cell lymphoma/marginal zone lymphoma, seen on pathology Seen by medical oncology Dr. Al Pimple Appreciate oncology's recommendations   Right lower lobe pneumonia, POA-received 8 days of Unasyn stopped on 02/14/2023 Patient on room air.   Hypertension on norvasc 10 mg hydralazine and coreg   Hyperlipidemia-continue Pravachol and Zetia   Asthma Continue home bronchodilator. Continue home regimen.   Obesity BMI 34 Recommend weight loss outpatient regular physical activity and healthy dieting.    Hypokalemia k 3.3 replete   Physical debility Continue PT OT Fall precautions   Bilateral lower extremity edema, symmetrical improving    Resolved AKI, prerenal in the setting of dehydration Baseline creatinine 0.67 with GFR greater than 60 Presented with creatinine of 1.17 with GFR of 51 Continue to avoid nephrotoxic agents, dehydration, and hypotension.  Estimated body mass index is 34.63 kg/m as calculated from the following:   Height as of this encounter: 5\' 4"  (1.626 m).   Weight as of this encounter: 91.5 kg.  Discharge Instructions  Discharge Instructions     Diet - low sodium heart healthy   Complete by: As directed    Discharge wound care:   Complete by: As directed    Trim Mepitel to size of wound base and place in the wound bed.  Change every 3 days.  Moist to dry dressing changes to midline wound twice daily on top of Mepitel.   Increase activity slowly   Complete by: As directed       Allergies as of 02/15/2023  Reactions   Cozaar [losartan Potassium]    Caused cough and sweats. Tingling in legs.   Lisinopril Cough   Statins Other  (See Comments)   myalgias        Medication List     STOP taking these medications    nabumetone 500 MG tablet Commonly known as: RELAFEN   triamcinolone cream 0.5 % Commonly known as: KENALOG       TAKE these medications    acetaminophen 500 MG tablet Commonly known as: TYLENOL Take 2 tablets (1,000 mg total) by mouth 4 (four) times daily.   amLODipine 10 MG tablet Commonly known as: NORVASC TAKE 1 TABLET(10 MG) BY MOUTH DAILY   aspirin EC 81 MG tablet Take 81 mg by mouth daily. Swallow whole.   carvedilol 6.25 MG tablet Commonly known as: COREG Take 1 tablet (6.25 mg total) by mouth 2 (two) times daily with a meal.   cetirizine 10 MG tablet Commonly known as: ZYRTEC Take 10 mg by mouth daily.   cyclobenzaprine 10 MG tablet Commonly known as: FLEXERIL TAKE 1 TABLET(10 MG) BY MOUTH THREE TIMES DAILY AS NEEDED FOR MUSCLE SPASMS   ezetimibe 10 MG tablet Commonly known as: ZETIA Take 1 tablet (10 mg total) by mouth daily.   famotidine 40 MG tablet Commonly known as: Pepcid Take 1 tablet (40 mg total) by mouth at bedtime.   fluticasone 50 MCG/ACT nasal spray Commonly known as: FLONASE PLACE 2 SPRAYS INTO BOTH NOSTRILS DAILY.   hydrALAZINE 25 MG tablet Commonly known as: APRESOLINE TAKE 1 TABLET(25 MG) BY MOUTH THREE TIMES DAILY   montelukast 10 MG tablet Commonly known as: SINGULAIR Take 1 tablet (10 mg total) by mouth at bedtime.   ondansetron 4 MG tablet Commonly known as: ZOFRAN Take 1 tablet (4 mg total) by mouth every 6 (six) hours as needed for nausea.   pantoprazole 20 MG tablet Commonly known as: PROTONIX TAKE 1 TABLET(20 MG) BY MOUTH DAILY   polyethylene glycol 17 g packet Commonly known as: MIRALAX / GLYCOLAX Take 17 g by mouth daily as needed for mild constipation.   potassium chloride SA 20 MEQ tablet Commonly known as: KLOR-CON M Take 2 tablets (40 mEq total) by mouth daily. Start taking on: February 16, 2023   pravastatin 40 MG  tablet Commonly known as: PRAVACHOL Take 1 tablet (40 mg total) by mouth daily.   traMADol 50 MG tablet Commonly known as: ULTRAM Take 1-2 tablets (50-100 mg total) by mouth every 6 (six) hours as needed for moderate pain or severe pain.   Vitamin D 50 MCG (2000 UT) Caps Take 1 capsule by mouth daily.               Discharge Care Instructions  (From admission, onward)           Start     Ordered   02/15/23 0000  Discharge wound care:       Comments: Trim Mepitel to size of wound base and place in the wound bed.  Change every 3 days.  Moist to dry dressing changes to midline wound twice daily on top of Mepitel.   02/15/23 1026            Contact information for follow-up providers     Abigail Miyamoto, MD. Go on 03/04/2023.   Specialty: General Surgery Why: 10:30 AM, please arrive 15 min early to check in. Contact information: [address] [place] [address] [phone]  Etta Grandchild, MD Follow up.   Specialty: Internal Medicine Contact information: [address] [place] [address] [phone]              Contact information for after-discharge care     Destination     [facility] Preferred SNF .   Service: Skilled Nursing Contact information: [address] [place] [address] [place] [address] [phone]                    Allergies  Allergen Reactions   Cozaar [Losartan Potassium]     Caused cough and sweats. Tingling in legs.   Lisinopril Cough   Statins Other (See Comments)    myalgias    Consultations:surgery   Procedures/Studies: DG Abd 1 View  Result Date: 02/09/2023 CLINICAL DATA:  Abdominal distension.  Abdominal pain.  Diarrhea. EXAM: ABDOMEN - 1 VIEW COMPARISON:  02/06/2023 FINDINGS: No nasogastric tube is visible. Moderate amount of air present in the stomach. Dilated loops of small intestine are present which could be due to ileus or partial small bowel  obstruction. Colon pattern is normal. IMPRESSION: 1. No nasogastric tube visible. 2. Dilated loops of small intestine that could be due to ileus or partial small bowel obstruction. Similar appearance to the study of 3 days ago. Electronically Signed   By: Paulina Fusi M.D.   On: 02/09/2023 13:19   DG CHEST PORT 1 VIEW  Result Date: 02/06/2023 CLINICAL DATA:  Fever abdominal pain EXAM: PORTABLE CHEST 1 VIEW COMPARISON:  03/31/2020 FINDINGS: Esophageal tube tip below the diaphragm and curled at the fundus of stomach. Low lung volumes with atelectasis or pneumonia at the right base. Mild cardiomegaly with aortic atherosclerosis. IMPRESSION: Low lung volumes with atelectasis or pneumonia at the right base. Electronically Signed   By: Jasmine Pang M.D.   On: 02/06/2023 22:01   DG Abd Portable 1V  Result Date: 02/06/2023 CLINICAL DATA:  Readjustment of NG tube. EXAM: PORTABLE ABDOMEN - 1 VIEW COMPARISON:  Earlier same day FINDINGS: Interval advancement of enteric tube with tip and side port now projecting over the expected location of the gastric fundus. Otherwise, stable examination. IMPRESSION: 1. Interval advancement of enteric tube with tip and side port now projecting over the expected location of the gastric fundus. 2. Otherwise, stable examination, as described on abdominal radiograph performed earlier same day. Electronically Signed   By: Simonne Come M.D.   On: 02/06/2023 11:50   DG Abd Portable 1V  Result Date: 02/06/2023 CLINICAL DATA:  Confirm NG tube placement. Postoperative nausea and vomiting. EXAM: PORTABLE ABDOMEN - 1 VIEW COMPARISON:  Earlier same day; 02/04/2023 FINDINGS: Interval retraction enteric tube with tip and side port projected over the distal esophagus. Advancement at least 17 cm is advised. Redemonstrated moderate-to-marked gaseous distention of multiple loops of small bowel with index loop of small bowel within the right mid abdomen measuring 5.3 cm in diameter. Midline abdominal  staples. Limited visualization of the lower thorax demonstrates trace right-sided pleural effusion with bibasilar heterogeneous/consolidative opacities, right-greater-than-left. No acute osseous abnormalities. Mild scoliotic curvature of the thoracolumbar spine with associated multilevel DDD, incompletely evaluated. IMPRESSION: 1. Interval retraction enteric tube with tip and side port projected over the distal esophagus. Advancement at least 17 cm is advised. 2. Redemonstrated moderate-to-marked gaseous distention of multiple loops of small bowel, nonspecific though worrisome for small bowel obstruction. 3. Trace right-sided pleural effusion with bibasilar heterogeneous/consolidative opacities, right-greater-than-left, atelectasis versus infiltrate. Electronically Signed   By: Jonny Ruiz  Watts M.D.   On: 02/06/2023 11:49   DG Abd Portable 1V-Small Bowel Obstruction Protocol-initial, 8 hr delay  Result Date: 02/04/2023 CLINICAL DATA:  Follow-up small bowel obstruction. 8 hour delay imaging. EXAM: PORTABLE ABDOMEN - 1 VIEW COMPARISON:  02/03/2023 . FINDINGS: there is an enteric tube with tip and side port in the stomach. Persistent dilated small bowel loops are identified which measure up to 3.8 cm. Gas and stool noted within normal caliber colon. IMPRESSION: Persistent small bowel obstruction pattern. Electronically Signed   By: Signa Kell M.D.   On: 02/04/2023 05:43   DG Abd Portable 1V-Small Bowel Protocol-Position Verification  Result Date: 02/03/2023 CLINICAL DATA:  Enteric tube placement EXAM: PORTABLE ABDOMEN - 1 VIEW COMPARISON:  Same day CT abdomen and pelvis FINDINGS: Gastric/enteric tube tip projects over the stomach. Partially imaged dilated small bowel loops in the right hemiabdomen. IMPRESSION: Gastric/enteric tube tip projects over the stomach. Electronically Signed   By: Agustin Cree M.D.   On: 02/03/2023 19:27   CT ABDOMEN PELVIS W CONTRAST  Result Date: 02/03/2023 CLINICAL DATA:  Bilateral  upper quadrant pain. Nausea, vomiting, and diarrhea for 2 days. EXAM: CT ABDOMEN AND PELVIS WITH CONTRAST TECHNIQUE: Multidetector CT imaging of the abdomen and pelvis was performed using the standard protocol following bolus administration of intravenous contrast. RADIATION DOSE REDUCTION: This exam was performed according to the departmental dose-optimization program which includes automated exposure control, adjustment of the mA and/or kV according to patient size and/or use of iterative reconstruction technique. CONTRAST:  OMNIPAQUE IOHEXOL 300 MG/ML  SOLN COMPARISON:  07/10/2013 FINDINGS: Lower Chest: No acute findings. Hepatobiliary: No suspicious hepatic masses identified. Gallbladder is unremarkable. No evidence of biliary ductal dilatation. Pancreas:  No mass or inflammatory changes. Spleen: Within normal limits in size and appearance. Adrenals/Urinary Tract: No suspicious masses identified. No evidence of ureteral calculi or hydronephrosis. Stomach/Bowel: Multiple moderately dilated small bowel loops are seen. A transition point is seen in the central small bowel mesentery which shows tethering, suspicious for adhesion. A markedly dilated small bowel loop is seen in the left abdomen which shows wall thickening and is suspicious for a closed loop obstruction. No evidence of pneumatosis, free intraperitoneal air, or abscess. Normal appendix visualized. Diverticulosis is seen mainly involving the descending and sigmoid colon, however there is no evidence of diverticulitis. Vascular/Lymphatic: No pathologically enlarged lymph nodes. No acute vascular findings. Reproductive: Prior hysterectomy noted. Adnexal regions are unremarkable in appearance. Tiny amount of free fluid noted in pelvic cul-de-sac. Other:  None. Musculoskeletal:  No suspicious bone lesions identified. IMPRESSION: Small bowel obstruction, with transition point in central small bowel mesentery suspicious for adhesion. Markedly dilated  small bowel loop in left abdomen mild wall thickening, raising suspicion for closed loop obstruction. Surgical consultation is recommended. No evidence of pneumatosis, free air, or abscess. Colonic diverticulosis, without radiographic evidence of diverticulitis. Electronically Signed   By: Danae Orleans M.D.   On: 02/03/2023 17:36   MM 3D SCREENING MAMMOGRAM BILATERAL BREAST  Result Date: 01/24/2023 CLINICAL DATA:  Screening. EXAM: DIGITAL SCREENING BILATERAL MAMMOGRAM WITH TOMOSYNTHESIS AND CAD TECHNIQUE: Bilateral screening digital craniocaudal and mediolateral oblique mammograms were obtained. Bilateral screening digital breast tomosynthesis was performed. The images were evaluated with computer-aided detection. COMPARISON:  Previous exam(s). ACR Breast Density Category b: There are scattered areas of fibroglandular density. FINDINGS: There are no findings suspicious for malignancy. IMPRESSION: No mammographic evidence of malignancy. A result letter of this screening mammogram will be mailed directly to the patient. RECOMMENDATION:  Screening mammogram in one year. (Code:SM-B-01Y) BI-RADS CATEGORY  1: Negative. Electronically Signed   By: Frederico Hamman M.D.   On: 01/24/2023 14:51   (Echo, Carotid, EGD, Colonoscopy, ERCP)    Subjective: Feels better Diarrhea slowing down  Discharge Exam: Vitals:   02/14/23 2048 02/15/23 0613  BP: (!) 141/80 (!) 150/72  Pulse: 84 81  Resp: 16 16  Temp: 98.8 F (37.1 C) 98.4 F (36.9 C)  SpO2: 97% 99%   Vitals:   02/13/23 2010 02/14/23 0523 02/14/23 2048 02/15/23 0613  BP: (!) 166/82 (!) 152/71 (!) 141/80 (!) 150/72  Pulse: 79 85 84 81  Resp: 18 18 16 16   Temp: 98.6 F (37 C) 98.8 F (37.1 C) 98.8 F (37.1 C) 98.4 F (36.9 C)  TempSrc: Oral Oral Oral Oral  SpO2: 100% 100% 97% 99%  Weight:      Height:        General: Pt is alert, awake, not in acute distress Cardiovascular: RRR, S1/S2 +, no rubs, no gallops Respiratory: CTA bilaterally, no  wheezing, no rhonchi Abdominal: Soft, NT, ND, bowel sounds + midline wound stable w/ partial dehiscence   Extremities: no edema, no cyanosis    The results of significant diagnostics from this hospitalization (including imaging, microbiology, ancillary and laboratory) are listed below for reference.     Microbiology: Recent Results (from the past 240 hour(s))  Culture, blood (Routine X 2) w Reflex to ID Panel     Status: None   Collection Time: 02/06/23  3:43 PM   Specimen: BLOOD LEFT ARM  Result Value Ref Range Status   Specimen Description   Final    BLOOD LEFT ARM Performed at [facility], [address], [place], [place] [address]    Special Requests   Final    BOTTLES DRAWN AEROBIC AND ANAEROBIC Blood Culture adequate volume Performed at [facility], [address], [place], [place] [address]    Culture   Final    NO GROWTH 5 DAYS Performed at [facility], [address], [place], [place] [address]    Report Status 02/11/2023 FINAL  Final  Culture, blood (Routine X 2) w Reflex to ID Panel     Status: None   Collection Time: 02/06/23  3:47 PM   Specimen: BLOOD LEFT ARM  Result Value Ref Range Status   Specimen Description   Final    BLOOD LEFT ARM Performed at [facility], [address], [place], [place] [address]    Special Requests   Final    BOTTLES DRAWN AEROBIC AND ANAEROBIC Blood Culture adequate volume Performed at [facility], [address], [place], [place] [address]    Culture   Final    NO GROWTH 5 DAYS Performed at [facility], [address], [place], [place] [address]    Report Status 02/11/2023 FINAL  Final  Expectorated Sputum Assessment w Gram Stain, Rflx to Resp Cult     Status: None   Collection Time: 02/06/23  4:45 PM   Specimen: Expectorated Sputum  Result Value Ref Range Status   Specimen Description EXPECTORATED SPUTUM  Final   Special Requests NONE  Final   Sputum  evaluation   Final    THIS SPECIMEN IS ACCEPTABLE FOR SPUTUM CULTURE Performed at [facility], [address], [place], [place] [address]    Report Status 02/06/2023 FINAL  Final  Culture, Respiratory w Gram Stain  Status: None   Collection Time: 02/06/23  4:45 PM  Result Value Ref Range Status   Specimen Description   Final    EXPECTORATED SPUTUM Performed at [facility], [address] [place], [place] [address]    Special Requests   Final    NONE Reflexed from [id number] Performed at [facility], [address] [place], [place] [address]    Gram Stain   Final    ABUNDANT WBC PRESENT, PREDOMINANTLY PMN MODERATE GRAM POSITIVE COCCI IN CHAINS IN CLUSTERS    Culture   Final    MODERATE Normal respiratory flora-no Staph aureus or Pseudomonas seen Performed at [facility], [address], [place], [place] [address]    Report Status 02/08/2023 FINAL  Final     Labs: BNP (last 3 results) No results for input(s): "BNP" in the last 8760 hours. Basic Metabolic Panel: Recent Labs  Lab 02/11/23 0417 02/12/23 0447 02/13/23 1405 02/14/23 0427 02/15/23 0428  NA 138 135 137 136 137  K 3.0* 3.3* 3.5 3.3* 3.3*  CL 107 106 107 106 108  CO2 23 22 23 22 22   GLUCOSE 108* 96 89 88 99  BUN 13 12 12 10 11   CREATININE 0.67 0.78 0.76 0.69 0.63  CALCIUM 8.8* 8.8* 8.7* 8.4* 8.6*  MG 2.2  --  2.2  --   --    Liver Function Tests: Recent Labs  Lab 02/12/23 0447 02/14/23 0427  AST 42* 23  ALT 138* 66*  ALKPHOS 91 85  BILITOT 0.8 0.3  PROT 5.9* 5.3*  ALBUMIN 2.4* 2.3*   No results for input(s): "LIPASE", "AMYLASE" in the last 168 hours. No results for input(s): "AMMONIA" in the last 168 hours. CBC: Recent Labs  Lab 02/11/23 0417 02/12/23 0447 02/13/23 1405 02/14/23 0427 02/15/23 0428  WBC 12.2* 12.1* 12.0* 11.9* 9.3  HGB 10.2* 10.4* 10.8* 9.9* 9.8*  HCT 32.0* 33.0* 35.0* 32.2* 30.3*  MCV 97.0 95.7 99.7 100.0  97.1  PLT 296 367 445* 453* 514*   Cardiac Enzymes: No results for input(s): "CKTOTAL", "CKMB", "CKMBINDEX", "TROPONINI" in the last 168 hours. BNP: Invalid input(s): "POCBNP" CBG: No results for input(s): "GLUCAP" in the last 168 hours. D-Dimer No results for input(s): "DDIMER" in the last 72 hours. Hgb A1c No results for input(s): "HGBA1C" in the last 72 hours. Lipid Profile No results for input(s): "CHOL", "HDL", "LDLCALC", "TRIG", "CHOLHDL", "LDLDIRECT" in the last 72 hours. Thyroid function studies No results for input(s): "TSH", "T4TOTAL", "T3FREE", "THYROIDAB" in the last 72 hours.  Invalid input(s): "FREET3" Anemia work up No results for input(s): "VITAMINB12", "FOLATE", "FERRITIN", "TIBC", "IRON", "RETICCTPCT" in the last 72 hours. Urinalysis    Component Value Date/Time   COLORURINE YELLOW 02/04/2023 0025   APPEARANCEUR CLEAR 02/04/2023 0025   LABSPEC 1.020 02/04/2023 0025   PHURINE 6.0 02/04/2023 0025   GLUCOSEU NEGATIVE 02/04/2023 0025   GLUCOSEU NEGATIVE 10/17/2022 0901   HGBUR NEGATIVE 02/04/2023 0025   BILIRUBINUR SMALL (A) 02/04/2023 0025   KETONESUR NEGATIVE 02/04/2023 0025   PROTEINUR NEGATIVE 02/04/2023 0025   UROBILINOGEN 0.2 10/17/2022 0901   NITRITE NEGATIVE 02/04/2023 0025   LEUKOCYTESUR NEGATIVE 02/04/2023 0025   Sepsis Labs Recent Labs  Lab 02/12/23 0447 02/13/23 1405 02/14/23 0427 02/15/23 0428  WBC 12.1* 12.0* 11.9* 9.3   Microbiology Recent Results (from the past 240 hour(s))  Culture, blood (Routine X 2) w Reflex to ID Panel     Status: None   Collection Time: 02/06/23  3:43 PM   Specimen: BLOOD LEFT ARM  Result Value Ref Range Status   Specimen Description   Final    BLOOD LEFT ARM Performed at [facility], [address], [place], [place] [address]    Special Requests   Final    BOTTLES DRAWN AEROBIC AND ANAEROBIC Blood Culture adequate volume Performed at [facility], [address],  [place], [place] [address]    Culture   Final    NO GROWTH 5 DAYS Performed at [facility], [address] [place], [place] [address]    Report Status 02/11/2023 FINAL  Final  Culture, blood (Routine X 2) w Reflex to ID Panel     Status: None   Collection Time: 02/06/23  3:47 PM   Specimen: BLOOD LEFT ARM  Result Value Ref Range Status   Specimen Description   Final    BLOOD LEFT ARM Performed at [facility], [address] [place], [place] [address]    Special Requests   Final    BOTTLES DRAWN AEROBIC AND ANAEROBIC Blood Culture adequate volume Performed at [facility], [address] [place], [place] [address]    Culture   Final    NO GROWTH 5 DAYS Performed at [facility], [address] [place], [place] [address]    Report Status 02/11/2023 FINAL  Final  Expectorated Sputum Assessment w Gram Stain, Rflx to Resp Cult     Status: None   Collection Time: 02/06/23  4:45 PM   Specimen: Expectorated Sputum  Result Value Ref Range Status   Specimen Description EXPECTORATED SPUTUM  Final   Special Requests NONE  Final   Sputum evaluation   Final    THIS SPECIMEN IS ACCEPTABLE FOR SPUTUM CULTURE Performed at [facility], [address], [place], [place] [address]    Report Status 02/06/2023 FINAL  Final  Culture, Respiratory w Gram Stain     Status: None   Collection Time: 02/06/23  4:45 PM  Result Value Ref Range Status   Specimen Description   Final    EXPECTORATED SPUTUM Performed at [facility], [address], [place], [place] [address]    Special Requests   Final    NONE Reflexed from [id number] Performed at [facility], [address], [place], [place] [address]    Gram Stain   Final    ABUNDANT WBC PRESENT, PREDOMINANTLY PMN MODERATE GRAM POSITIVE COCCI IN CHAINS IN CLUSTERS    Culture   Final    MODERATE Normal respiratory flora-no Staph aureus or Pseudomonas  seen Performed at [facility], [address], [place], [place] [address]    Report Status 02/08/2023 FINAL  Final     Time coordinating discharge: 38 minutes  SIGNED:  Alwyn Ren, MD  [facility] 02/15/2023, 10:27 AM

## 2023-02-15 NOTE — Care Management Important Message (Signed)
Important Message  Patient Details IM Letter given. Name: Kayla Lowe MRN: [id number] Date of Birth: Oct 02, 1955   Important Message Given:  Yes - Medicare IM     Caren Macadam 02/15/2023, 10:47 AM

## 2023-02-15 NOTE — Progress Notes (Signed)
Occupational Therapy Treatment Patient Details Name: Kayla Lowe MRN: [id number] DOB: 1956-02-26 Today's Date: 02/15/2023   History of present illness Patient is a 67 year old female who presented on 9/22 with pain in right and left upper quadrants nausea and diarrhea. Patient was found to have small bowel obstruction. Patient underwent ex lap and small bowel resection on 9/23. 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 PMH: asthma, obesity, hypokalemia, HTN   OT comments  Pt. Seen for skilled OT treatment session.  Pt. Able to demonstrate bed mobility oob with S. Simulated tub transfer with min a. (One person hand held).  Reports she will be getting all needed a/e and dme prior to home.  Reviewed uses of 3n1 if needed and how to adjust height.  Reports good support and assistance from her dtr. Is available.  Cont. With current acute ot poc.       If plan is discharge home, recommend the following:  Assistance with cooking/housework;Assist for transportation;Help with stairs or ramp for entrance   Equipment Recommendations  Other (comment)    Recommendations for Other Services      Precautions / Restrictions Precautions Precaution Comments: abdominal wound Required Braces or Orthoses: Other Brace Other Brace: ABD binder Restrictions Weight Bearing Restrictions: No       Mobility Bed Mobility Overal bed mobility: Needs Assistance Bed Mobility: Supine to Sit     Supine to sit: Supervision     General bed mobility comments: hob flat, no rails and set height of bed to high height that pt. described for home.    Transfers Overall transfer level: Modified independent Equipment used: None Transfers: Sit to/from Stand, Bed to chair/wheelchair/BSC             General transfer comment: safe and steady with transfers     Balance                                           ADL either performed or assessed with  clinical judgement   ADL Overall ADL's : Needs assistance/impaired                       Lower Body Dressing Details (indicate cue type and reason): reports she will get a/e declnied need to return demo again         Tub/ Shower Transfer: Tub transfer;Minimal assistance;Cueing for sequencing;Ambulation;Shower Field seismologist Details (indicate cue type and reason): pt. has tub with R faucet, able to complete simulated demo with one person hand held assist and use of what the wall of the shower will be.  reports she will have shower chair or 3n1 in the tub. also states he has hand held shower head.  reviewed items she can not hold onto for support ie: towel rod, soap dish, and water splash protector she has on side of tub. used a side step transfer, she swings legs over vs. bending knee and states she always swings her leg for in/out of the tub. no lob or safety concerns with this method Functional mobility during ADLs: Supervision/safety General ADL Comments: educated on uses of 3n1, plans to purchase a/e and likely shower seat and 3n1.  reports good support available from dtr. and states she will not bathe without someone present    Extremity/Trunk Assessment  Vision       Perception     Praxis      Cognition Arousal: Alert Behavior During Therapy: WFL for tasks assessed/performed, Flat affect Overall Cognitive Status: Within Functional Limits for tasks assessed                                          Exercises      Shoulder Instructions       General Comments      Pertinent Vitals/ Pain       Pain Assessment Pain Assessment: No/denies pain Pain Intervention(s): Premedicated before session  Home Living                                          Prior Functioning/Environment              Frequency  Min 1X/week        Progress Toward Goals  OT Goals(current goals can now be found in the  care plan section)  Progress towards OT goals: Progressing toward goals     Plan      Co-evaluation                 AM-PAC OT "6 Clicks" Daily Activity     Outcome Measure   Help from another person eating meals?: None Help from another person taking care of personal grooming?: A Little Help from another person toileting, which includes using toliet, bedpan, or urinal?: A Little Help from another person bathing (including washing, rinsing, drying)?: A Little Help from another person to put on and taking off regular upper body clothing?: A Little Help from another person to put on and taking off regular lower body clothing?: A Lot 6 Click Score: 18    End of Session Equipment Utilized During Treatment: Gait belt  OT Visit Diagnosis: Unsteadiness on feet (R26.81)   Activity Tolerance Patient tolerated treatment well   Patient Left in chair;with call bell/phone within reach   Nurse Communication          Time: 4098-1191 OT Time Calculation (min): 19 min  Charges: OT General Charges $OT Visit: 1 Visit OT Treatments $Self Care/Home Management : 8-22 mins  Boneta Lucks, COTA/L Acute Rehabilitation [phone]   Alessandra Bevels Lorraine-COTA/L 02/15/2023, 10:35 AM

## 2023-02-15 NOTE — TOC Transition Note (Signed)
Transition of Care [facility]) - CM/SW Discharge Note   Patient Details  Name: Kayla Lowe MRN: [id number] Date of Birth: 03/04/1956  [facility] CM/SW Contact:  Amada Jupiter, LCSW Phone Number: 02/15/2023, 12:29 PM   Clinical Narrative:    Pt medically cleared for dc to [facility] today and have insurance authorization.  Pt has arranged for a friend to provide dc transportation.  RN to call report to [phone].  No further TOC needs.   Final next level of care: Skilled Nursing Facility Barriers to Discharge: Barriers Resolved   Patient Goals and CMS Choice      Discharge Placement PASRR number recieved: 02/12/23 PASRR number recieved: 02/12/23            Patient chooses bed at: [facility] Patient to be transferred to facility by: friend   Patient and family notified of of transfer: 02/15/23  Discharge Plan and Services Additional resources added to the After Visit Summary for                  DME Arranged: N/A DME Agency: NA                  Social Determinants of Health (SDOH) Interventions SDOH Screenings   Food Insecurity: No Food Insecurity (02/03/2023)  Housing: Low Risk  (02/03/2023)  Transportation Needs: No Transportation Needs (02/03/2023)  Utilities: Not At Risk (02/03/2023)  Alcohol Screen: Low Risk  (03/22/2022)  Depression (PHQ2-9): Low Risk  (03/22/2022)  Financial Resource Strain: Low Risk  (03/22/2022)  Physical Activity: Inactive (03/22/2022)  Social Connections: Moderately Integrated (03/22/2022)  Stress: No Stress Concern Present (03/22/2022)  Tobacco Use: Low Risk  (02/04/2023)     Readmission Risk Interventions    02/05/2023    2:50 PM  Readmission Risk Prevention Plan  Post Dischage Appt Complete  Medication Screening Complete  Transportation Screening Complete

## 2023-02-17 ENCOUNTER — Other Ambulatory Visit: Payer: Self-pay | Admitting: Internal Medicine

## 2023-02-17 DIAGNOSIS — E785 Hyperlipidemia, unspecified: Secondary | ICD-10-CM

## 2023-02-18 DIAGNOSIS — K56609 Unspecified intestinal obstruction, unspecified as to partial versus complete obstruction: Secondary | ICD-10-CM | POA: Diagnosis not present

## 2023-02-18 DIAGNOSIS — R2681 Unsteadiness on feet: Secondary | ICD-10-CM | POA: Diagnosis not present

## 2023-02-18 DIAGNOSIS — M6281 Muscle weakness (generalized): Secondary | ICD-10-CM | POA: Diagnosis not present

## 2023-02-18 DIAGNOSIS — T8189XA Other complications of procedures, not elsewhere classified, initial encounter: Secondary | ICD-10-CM | POA: Diagnosis not present

## 2023-02-18 DIAGNOSIS — Z48815 Encounter for surgical aftercare following surgery on the digestive system: Secondary | ICD-10-CM | POA: Diagnosis not present

## 2023-02-20 DIAGNOSIS — M6281 Muscle weakness (generalized): Secondary | ICD-10-CM | POA: Diagnosis not present

## 2023-02-20 DIAGNOSIS — J4520 Mild intermittent asthma, uncomplicated: Secondary | ICD-10-CM | POA: Diagnosis not present

## 2023-02-21 DIAGNOSIS — M6281 Muscle weakness (generalized): Secondary | ICD-10-CM | POA: Diagnosis not present

## 2023-02-21 DIAGNOSIS — K56609 Unspecified intestinal obstruction, unspecified as to partial versus complete obstruction: Secondary | ICD-10-CM | POA: Diagnosis not present

## 2023-02-21 DIAGNOSIS — Z48815 Encounter for surgical aftercare following surgery on the digestive system: Secondary | ICD-10-CM | POA: Diagnosis not present

## 2023-02-21 DIAGNOSIS — R2681 Unsteadiness on feet: Secondary | ICD-10-CM | POA: Diagnosis not present

## 2023-02-22 ENCOUNTER — Inpatient Hospital Stay: Payer: Medicare Other

## 2023-02-22 ENCOUNTER — Telehealth: Payer: Self-pay | Admitting: *Deleted

## 2023-02-22 ENCOUNTER — Inpatient Hospital Stay: Payer: Medicare Other | Attending: Hematology and Oncology | Admitting: Hematology and Oncology

## 2023-02-22 VITALS — BP 131/46 | HR 102 | Temp 98.2°F | Resp 18 | Ht 64.0 in | Wt 192.9 lb

## 2023-02-22 DIAGNOSIS — I10 Essential (primary) hypertension: Secondary | ICD-10-CM | POA: Diagnosis not present

## 2023-02-22 DIAGNOSIS — C8580 Other specified types of non-Hodgkin lymphoma, unspecified site: Secondary | ICD-10-CM

## 2023-02-22 DIAGNOSIS — D649 Anemia, unspecified: Secondary | ICD-10-CM | POA: Diagnosis not present

## 2023-02-22 DIAGNOSIS — Z809 Family history of malignant neoplasm, unspecified: Secondary | ICD-10-CM | POA: Diagnosis not present

## 2023-02-22 DIAGNOSIS — C8333 Diffuse large B-cell lymphoma, intra-abdominal lymph nodes: Secondary | ICD-10-CM | POA: Diagnosis not present

## 2023-02-22 DIAGNOSIS — K56609 Unspecified intestinal obstruction, unspecified as to partial versus complete obstruction: Secondary | ICD-10-CM | POA: Diagnosis not present

## 2023-02-22 DIAGNOSIS — C8309 Small cell B-cell lymphoma, extranodal and solid organ sites: Secondary | ICD-10-CM | POA: Insufficient documentation

## 2023-02-22 LAB — CBC WITH DIFFERENTIAL/PLATELET
Abs Immature Granulocytes: 0.02 10*3/uL (ref 0.00–0.07)
Basophils Absolute: 0.1 10*3/uL (ref 0.0–0.1)
Basophils Relative: 1 %
Eosinophils Absolute: 0.2 10*3/uL (ref 0.0–0.5)
Eosinophils Relative: 2 %
HCT: 35.4 % — ABNORMAL LOW (ref 36.0–46.0)
Hemoglobin: 11.2 g/dL — ABNORMAL LOW (ref 12.0–15.0)
Immature Granulocytes: 0 %
Lymphocytes Relative: 11 %
Lymphs Abs: 0.8 10*3/uL (ref 0.7–4.0)
MCH: 31.4 pg (ref 26.0–34.0)
MCHC: 31.6 g/dL (ref 30.0–36.0)
MCV: 99.2 fL (ref 80.0–100.0)
Monocytes Absolute: 0.6 10*3/uL (ref 0.1–1.0)
Monocytes Relative: 8 %
Neutro Abs: 5.5 10*3/uL (ref 1.7–7.7)
Neutrophils Relative %: 78 %
Platelets: 666 10*3/uL — ABNORMAL HIGH (ref 150–400)
RBC: 3.57 MIL/uL — ABNORMAL LOW (ref 3.87–5.11)
RDW: 16 % — ABNORMAL HIGH (ref 11.5–15.5)
WBC: 7.1 10*3/uL (ref 4.0–10.5)
nRBC: 0 % (ref 0.0–0.2)

## 2023-02-22 LAB — CMP (CANCER CENTER ONLY)
ALT: 18 U/L (ref 0–44)
AST: 14 U/L — ABNORMAL LOW (ref 15–41)
Albumin: 3.6 g/dL (ref 3.5–5.0)
Alkaline Phosphatase: 107 U/L (ref 38–126)
Anion gap: 5 (ref 5–15)
BUN: 9 mg/dL (ref 8–23)
CO2: 28 mmol/L (ref 22–32)
Calcium: 10.2 mg/dL (ref 8.9–10.3)
Chloride: 107 mmol/L (ref 98–111)
Creatinine: 0.95 mg/dL (ref 0.44–1.00)
GFR, Estimated: >60 mL/min (ref >60)
Glucose, Bld: 95 mg/dL (ref 70–99)
Potassium: 4.6 mmol/L (ref 3.5–5.1)
Sodium: 140 mmol/L (ref 135–145)
Total Bilirubin: 0.4 mg/dL (ref 0.3–1.2)
Total Protein: 7.1 g/dL (ref 6.5–8.1)

## 2023-02-22 LAB — LACTATE DEHYDROGENASE: LDH: 166 U/L (ref 98–192)

## 2023-02-22 NOTE — Telephone Encounter (Signed)
This RN called to [facility] - Dr Doroteo Glassman office and spoke with [facility] - informed her that per visit today Dr Al Pimple noted continued open wound from surgery and concern with delayed healing. Dr Al Pimple requested call to Dr Doroteo Glassman to report her assessment.  Jeanice Lim states pt is currently scheduled for follow up on 10/21- but will alert MD for possible need to be seen before scheduled date.

## 2023-02-22 NOTE — Progress Notes (Signed)
[facility] FOLLOW UP NOTE  Patient Care Team: Etta Grandchild, MD as PCP - General (Internal Medicine) Norva Pavlov, OD as Consulting Physician (Optometry) Szabat, Vinnie Level, [facility] (Inactive) as Pharmacist (Pharmacist)  CHIEF COMPLAINTS/PURPOSE OF CONSULTATION:  Marginal zone lymphoma  ASSESSMENT & PLAN:   This is a very 67 yr old female pt with new diagnosis of small bowel marginal zone lymphoma here for hospital follow up.  Postoperative wound care Large, non-healing surgical wound. No signs of infection. Patient unable to manage wound care independently. Discussed potential need for wound vac to facilitate healing. -Communicate with surgeon (Dr. Magnus Ivan) regarding wound status and potential need for wound vac. -Plan for home health nurse to assist with wound care if patient is discharged from rehab.  Marginal Zone Lymphoma Indolent lymphoma, staging incomplete. Discussed plan for further staging with PET scan and bone marrow biopsy once wound is healed. Also discussed potential treatment with Rituximab, a CD20 antibody, pending wound healing. -Plan to see patient in 3-4 weeks to reassess wound and potentially proceed with further staging and treatment.  Anemia and Hypokalemia Anemia and low potassium noted during recent hospitalization. Patient has been taking potassium supplements. -Order CBC and potassium level today to assess for improvement. Hb improved substantially. Hypokalemia resolved. LDH normal.  HISTORY OF PRESENTING ILLNESS:  Kayla Lowe 67 y.o. female is here because of marginal zone lymphoma of small bowel.  Oncology History   No history exists.    INTERIM HISTORY   The patient, with a history of indolent lymphoma and recent hospitalization, is currently in a rehab center. She reports daily improvement and is able to ambulate and eat without issue. She is expected to be discharged soon, but expresses concern about managing her  wound care at home. She is unable to visualize the wound and anticipates needing home health assistance for dressing changes.  The patient's wound is not infected, but remains open and wide. The patient also reports a history of anemia and low potassium, for which she is taking supplements.  REVIEW OF SYSTEMS:   Constitutional: Denies fevers, chills or abnormal night sweats Eyes: Denies blurriness of vision, double vision or watery eyes Ears, nose, mouth, throat, and face: Denies mucositis or sore throat Respiratory: Denies cough, dyspnea or wheezes Cardiovascular: Denies palpitation, chest discomfort or lower extremity swelling Gastrointestinal:  Denies nausea, heartburn or change in bowel habits Skin: Denies abnormal skin rashes Lymphatics: Denies new lymphadenopathy or easy bruising Neurological:Denies numbness, tingling or new weaknesses Behavioral/Psych: Mood is stable, no new changes  All other systems were reviewed with the patient and are negative.   MEDICAL HISTORY:  Past Medical History:  Diagnosis Date   GERD (gastroesophageal reflux disease)    Hyperlipidemia    Hypertension    Osteoarthritis     SURGICAL HISTORY: Past Surgical History:  Procedure Laterality Date   ABDOMINAL HYSTERECTOMY  1987   LAPAROTOMY N/A 02/04/2023   Procedure: EXPLORATORY LAPAROTOMY, WITH SMALL BOWEL RESECTION;  Surgeon: Abigail Miyamoto, MD;  Location: [facility] ORS;  Service: General;  Laterality: N/A;    SOCIAL HISTORY: Social History   Socioeconomic History   Marital status: Legally Separated    Spouse name: Not on file   Number of children: Not on file   Years of education: Not on file   Highest education level: Not on file  Occupational History   Not on file  Tobacco Use   Smoking status: Never   Smokeless tobacco: Never  Substance and Sexual Activity  Alcohol use: No   Drug use: No   Sexual activity: Not Currently  Other Topics Concern   Not on file  Social History Narrative    Not on file   Social Determinants of Health   Financial Resource Strain: Low Risk  (03/22/2022)   Overall Financial Resource Strain (CARDIA)    Difficulty of Paying Living Expenses: Not hard at all  Food Insecurity: No Food Insecurity (02/03/2023)   Hunger Vital Sign    Worried About Running Out of Food in the Last Year: Never true    Ran Out of Food in the Last Year: Never true  Transportation Needs: No Transportation Needs (02/03/2023)   PRAPARE - Administrator, Civil Service (Medical): No    Lack of Transportation (Non-Medical): No  Physical Activity: Inactive (03/22/2022)   Exercise Vital Sign    Days of Exercise per Week: 0 days    Minutes of Exercise per Session: 0 min  Stress: No Stress Concern Present (03/22/2022)   [employer] of Occupational Health - Occupational Stress Questionnaire    Feeling of Stress : Not at all  Social Connections: Moderately Integrated (03/22/2022)   Social Connection and Isolation Panel [NHANES]    Frequency of Communication with Friends and Family: More than three times a week    Frequency of Social Gatherings with Friends and Family: More than three times a week    Attends Religious Services: More than 4 times per year    Active Member of [employer] or Organizations: No    Attends Engineer, structural: More than 4 times per year    Marital Status: Separated  Intimate Partner Violence: Not At Risk (02/03/2023)   Humiliation, Afraid, Rape, and Kick questionnaire    Fear of Current or Ex-Partner: No    Emotionally Abused: No    Physically Abused: No    Sexually Abused: No    FAMILY HISTORY: Family History  Problem Relation Age of Onset   Cancer Father    Hypertension Brother    Diabetes Brother    Diabetes Paternal Grandfather    Diabetes Paternal Uncle    Colon cancer Neg Hx    Hypercalcemia Neg Hx     ALLERGIES:  is allergic to cozaar [losartan potassium], lisinopril, and statins.  MEDICATIONS:  Current  Outpatient Medications  Medication Sig Dispense Refill   acetaminophen (TYLENOL) 500 MG tablet Take 2 tablets (1,000 mg total) by mouth 4 (four) times daily. 30 tablet 0   amLODipine (NORVASC) 10 MG tablet TAKE 1 TABLET(10 MG) BY MOUTH DAILY 90 tablet 1   aspirin EC 81 MG tablet Take 81 mg by mouth daily. Swallow whole.     carvedilol (COREG) 6.25 MG tablet Take 1 tablet (6.25 mg total) by mouth 2 (two) times daily with a meal. 180 tablet 1   cetirizine (ZYRTEC) 10 MG tablet Take 10 mg by mouth daily.     Cholecalciferol (VITAMIN D) 50 MCG (2000 UT) CAPS Take 1 capsule by mouth daily.     cyclobenzaprine (FLEXERIL) 10 MG tablet TAKE 1 TABLET(10 MG) BY MOUTH THREE TIMES DAILY AS NEEDED FOR MUSCLE SPASMS 30 tablet 2   ezetimibe (ZETIA) 10 MG tablet TAKE 1 TABLET(10 MG) BY MOUTH DAILY 90 tablet 0   famotidine (PEPCID) 40 MG tablet Take 1 tablet (40 mg total) by mouth at bedtime. 90 tablet 1   fluticasone (FLONASE) 50 MCG/ACT nasal spray PLACE 2 SPRAYS INTO BOTH NOSTRILS DAILY. 16 g 2   hydrALAZINE (  APRESOLINE) 25 MG tablet TAKE 1 TABLET(25 MG) BY MOUTH THREE TIMES DAILY 270 tablet 1   montelukast (SINGULAIR) 10 MG tablet Take 1 tablet (10 mg total) by mouth at bedtime. 90 tablet 1   ondansetron (ZOFRAN) 4 MG tablet Take 1 tablet (4 mg total) by mouth every 6 (six) hours as needed for nausea. 20 tablet 0   pantoprazole (PROTONIX) 20 MG tablet TAKE 1 TABLET(20 MG) BY MOUTH DAILY 90 tablet 1   polyethylene glycol (MIRALAX / GLYCOLAX) 17 g packet Take 17 g by mouth daily as needed for mild constipation. 14 each 0   potassium chloride SA (KLOR-CON M) 20 MEQ tablet Take 2 tablets (40 mEq total) by mouth daily. 30 tablet 0   pravastatin (PRAVACHOL) 40 MG tablet Take 1 tablet (40 mg total) by mouth daily. 90 tablet 1   traMADol (ULTRAM) 50 MG tablet Take 1-2 tablets (50-100 mg total) by mouth every 6 (six) hours as needed for moderate pain or severe pain. 30 tablet 0   No current facility-administered  medications for this visit.      PHYSICAL EXAMINATION: ECOG PERFORMANCE STATUS: 2 - Symptomatic, <50% confined to bed  Vitals:   02/22/23 1024  BP: (!) 131/46  Pulse: (!) 102  Resp: 18  Temp: 98.2 F (36.8 C)  SpO2: 100%   Filed Weights   02/22/23 1024  Weight: 192 lb 14.4 oz (87.5 kg)    GENERAL:alert, no distress and comfortable ABDOMEN:wide gaping wound in the abdomen. No evidence of infection. Musculoskeletal:no cyanosis of digits and no clubbing  PSYCH: alert & oriented x 3 with fluent speech NEURO: no focal motor/sensory deficits  LABORATORY DATA:  I have reviewed the data as listed Lab Results  Component Value Date   WBC 9.3 02/15/2023   HGB 9.8 (L) 02/15/2023   HCT 30.3 (L) 02/15/2023   MCV 97.1 02/15/2023   PLT 514 (H) 02/15/2023     Chemistry      Component Value Date/Time   NA 137 02/15/2023 0428   NA 145 (H) 07/29/2018 1112   K 3.3 (L) 02/15/2023 0428   CL 108 02/15/2023 0428   CO2 22 02/15/2023 0428   BUN 11 02/15/2023 0428   BUN 8 07/29/2018 1112   CREATININE 0.63 02/15/2023 0428   CREATININE 0.87 08/03/2015 0942      Component Value Date/Time   CALCIUM 8.6 (L) 02/15/2023 0428   ALKPHOS 85 02/14/2023 0427   AST 23 02/14/2023 0427   ALT 66 (H) 02/14/2023 0427   BILITOT 0.3 02/14/2023 0427   BILITOT 0.5 07/29/2018 1112       RADIOGRAPHIC STUDIES: I have personally reviewed the radiological images as listed and agreed with the findings in the report. DG Abd 1 View  Result Date: 02/09/2023 CLINICAL DATA:  Abdominal distension.  Abdominal pain.  Diarrhea. EXAM: ABDOMEN - 1 VIEW COMPARISON:  02/06/2023 FINDINGS: No nasogastric tube is visible. Moderate amount of air present in the stomach. Dilated loops of small intestine are present which could be due to ileus or partial small bowel obstruction. Colon pattern is normal. IMPRESSION: 1. No nasogastric tube visible. 2. Dilated loops of small intestine that could be due to ileus or partial small  bowel obstruction. Similar appearance to the study of 3 days ago. Electronically Signed   By: Paulina Fusi M.D.   On: 02/09/2023 13:19   DG CHEST PORT 1 VIEW  Result Date: 02/06/2023 CLINICAL DATA:  Fever abdominal pain EXAM: PORTABLE CHEST 1 VIEW COMPARISON:  03/31/2020 FINDINGS: Esophageal tube tip below the diaphragm and curled at the fundus of stomach. Low lung volumes with atelectasis or pneumonia at the right base. Mild cardiomegaly with aortic atherosclerosis. IMPRESSION: Low lung volumes with atelectasis or pneumonia at the right base. Electronically Signed   By: Jasmine Pang M.D.   On: 02/06/2023 22:01   DG Abd Portable 1V  Result Date: 02/06/2023 CLINICAL DATA:  Readjustment of NG tube. EXAM: PORTABLE ABDOMEN - 1 VIEW COMPARISON:  Earlier same day FINDINGS: Interval advancement of enteric tube with tip and side port now projecting over the expected location of the gastric fundus. Otherwise, stable examination. IMPRESSION: 1. Interval advancement of enteric tube with tip and side port now projecting over the expected location of the gastric fundus. 2. Otherwise, stable examination, as described on abdominal radiograph performed earlier same day. Electronically Signed   By: Simonne Come M.D.   On: 02/06/2023 11:50   DG Abd Portable 1V  Result Date: 02/06/2023 CLINICAL DATA:  Confirm NG tube placement. Postoperative nausea and vomiting. EXAM: PORTABLE ABDOMEN - 1 VIEW COMPARISON:  Earlier same day; 02/04/2023 FINDINGS: Interval retraction enteric tube with tip and side port projected over the distal esophagus. Advancement at least 17 cm is advised. Redemonstrated moderate-to-marked gaseous distention of multiple loops of small bowel with index loop of small bowel within the right mid abdomen measuring 5.3 cm in diameter. Midline abdominal staples. Limited visualization of the lower thorax demonstrates trace right-sided pleural effusion with bibasilar heterogeneous/consolidative opacities,  right-greater-than-left. No acute osseous abnormalities. Mild scoliotic curvature of the thoracolumbar spine with associated multilevel DDD, incompletely evaluated. IMPRESSION: 1. Interval retraction enteric tube with tip and side port projected over the distal esophagus. Advancement at least 17 cm is advised. 2. Redemonstrated moderate-to-marked gaseous distention of multiple loops of small bowel, nonspecific though worrisome for small bowel obstruction. 3. Trace right-sided pleural effusion with bibasilar heterogeneous/consolidative opacities, right-greater-than-left, atelectasis versus infiltrate. Electronically Signed   By: Simonne Come M.D.   On: 02/06/2023 11:49   DG Abd Portable 1V-Small Bowel Obstruction Protocol-initial, 8 hr delay  Result Date: 02/04/2023 CLINICAL DATA:  Follow-up small bowel obstruction. 8 hour delay imaging. EXAM: PORTABLE ABDOMEN - 1 VIEW COMPARISON:  02/03/2023 . FINDINGS: there is an enteric tube with tip and side port in the stomach. Persistent dilated small bowel loops are identified which measure up to 3.8 cm. Gas and stool noted within normal caliber colon. IMPRESSION: Persistent small bowel obstruction pattern. Electronically Signed   By: Signa Kell M.D.   On: 02/04/2023 05:43   DG Abd Portable 1V-Small Bowel Protocol-Position Verification  Result Date: 02/03/2023 CLINICAL DATA:  Enteric tube placement EXAM: PORTABLE ABDOMEN - 1 VIEW COMPARISON:  Same day CT abdomen and pelvis FINDINGS: Gastric/enteric tube tip projects over the stomach. Partially imaged dilated small bowel loops in the right hemiabdomen. IMPRESSION: Gastric/enteric tube tip projects over the stomach. Electronically Signed   By: Agustin Cree M.D.   On: 02/03/2023 19:27   CT ABDOMEN PELVIS W CONTRAST  Result Date: 02/03/2023 CLINICAL DATA:  Bilateral upper quadrant pain. Nausea, vomiting, and diarrhea for 2 days. EXAM: CT ABDOMEN AND PELVIS WITH CONTRAST TECHNIQUE: Multidetector CT imaging of the  abdomen and pelvis was performed using the standard protocol following bolus administration of intravenous contrast. RADIATION DOSE REDUCTION: This exam was performed according to the departmental dose-optimization program which includes automated exposure control, adjustment of the mA and/or kV according to patient size and/or use of iterative reconstruction technique. CONTRAST:   OMNIPAQUE IOHEXOL 300 MG/ML  SOLN COMPARISON:  07/10/2013 FINDINGS: Lower Chest: No acute findings. Hepatobiliary: No suspicious hepatic masses identified. Gallbladder is unremarkable. No evidence of biliary ductal dilatation. Pancreas:  No mass or inflammatory changes. Spleen: Within normal limits in size and appearance. Adrenals/Urinary Tract: No suspicious masses identified. No evidence of ureteral calculi or hydronephrosis. Stomach/Bowel: Multiple moderately dilated small bowel loops are seen. A transition point is seen in the central small bowel mesentery which shows tethering, suspicious for adhesion. A markedly dilated small bowel loop is seen in the left abdomen which shows wall thickening and is suspicious for a closed loop obstruction. No evidence of pneumatosis, free intraperitoneal air, or abscess. Normal appendix visualized. Diverticulosis is seen mainly involving the descending and sigmoid colon, however there is no evidence of diverticulitis. Vascular/Lymphatic: No pathologically enlarged lymph nodes. No acute vascular findings. Reproductive: Prior hysterectomy noted. Adnexal regions are unremarkable in appearance. Tiny amount of free fluid noted in pelvic cul-de-sac. Other:  None. Musculoskeletal:  No suspicious bone lesions identified. IMPRESSION: Small bowel obstruction, with transition point in central small bowel mesentery suspicious for adhesion. Markedly dilated small bowel loop in left abdomen mild wall thickening, raising suspicion for closed loop obstruction. Surgical consultation is recommended. No evidence  of pneumatosis, free air, or abscess. Colonic diverticulosis, without radiographic evidence of diverticulitis. Electronically Signed   By: Danae Orleans M.D.   On: 02/03/2023 17:36    All questions were answered. The patient knows to call the clinic with any problems, questions or concerns.     Rachel Moulds, MD 02/22/2023 10:28 AM

## 2023-02-25 ENCOUNTER — Telehealth: Payer: Self-pay | Admitting: Internal Medicine

## 2023-02-25 DIAGNOSIS — K56609 Unspecified intestinal obstruction, unspecified as to partial versus complete obstruction: Secondary | ICD-10-CM | POA: Diagnosis not present

## 2023-02-25 DIAGNOSIS — R2681 Unsteadiness on feet: Secondary | ICD-10-CM | POA: Diagnosis not present

## 2023-02-25 DIAGNOSIS — M6281 Muscle weakness (generalized): Secondary | ICD-10-CM | POA: Diagnosis not present

## 2023-02-25 DIAGNOSIS — T8189XA Other complications of procedures, not elsewhere classified, initial encounter: Secondary | ICD-10-CM | POA: Diagnosis not present

## 2023-02-25 DIAGNOSIS — Z48815 Encounter for surgical aftercare following surgery on the digestive system: Secondary | ICD-10-CM | POA: Diagnosis not present

## 2023-02-25 NOTE — Telephone Encounter (Signed)
Patient called and said that insurance is trying to kick her out of [facility].  She wants to know if there is anything you can do to help.  Please call patient at [phone]

## 2023-02-28 DIAGNOSIS — K56609 Unspecified intestinal obstruction, unspecified as to partial versus complete obstruction: Secondary | ICD-10-CM | POA: Diagnosis not present

## 2023-02-28 DIAGNOSIS — R2681 Unsteadiness on feet: Secondary | ICD-10-CM | POA: Diagnosis not present

## 2023-02-28 DIAGNOSIS — M6281 Muscle weakness (generalized): Secondary | ICD-10-CM | POA: Diagnosis not present

## 2023-02-28 DIAGNOSIS — Z48815 Encounter for surgical aftercare following surgery on the digestive system: Secondary | ICD-10-CM | POA: Diagnosis not present

## 2023-03-04 DIAGNOSIS — M6281 Muscle weakness (generalized): Secondary | ICD-10-CM | POA: Diagnosis not present

## 2023-03-04 DIAGNOSIS — T8189XA Other complications of procedures, not elsewhere classified, initial encounter: Secondary | ICD-10-CM | POA: Diagnosis not present

## 2023-03-04 DIAGNOSIS — R2681 Unsteadiness on feet: Secondary | ICD-10-CM | POA: Diagnosis not present

## 2023-03-04 DIAGNOSIS — K56609 Unspecified intestinal obstruction, unspecified as to partial versus complete obstruction: Secondary | ICD-10-CM | POA: Diagnosis not present

## 2023-03-04 DIAGNOSIS — Z48815 Encounter for surgical aftercare following surgery on the digestive system: Secondary | ICD-10-CM | POA: Diagnosis not present

## 2023-03-07 DIAGNOSIS — R2681 Unsteadiness on feet: Secondary | ICD-10-CM | POA: Diagnosis not present

## 2023-03-07 DIAGNOSIS — K56609 Unspecified intestinal obstruction, unspecified as to partial versus complete obstruction: Secondary | ICD-10-CM | POA: Diagnosis not present

## 2023-03-07 DIAGNOSIS — M6281 Muscle weakness (generalized): Secondary | ICD-10-CM | POA: Diagnosis not present

## 2023-03-07 DIAGNOSIS — Z48815 Encounter for surgical aftercare following surgery on the digestive system: Secondary | ICD-10-CM | POA: Diagnosis not present

## 2023-03-10 DIAGNOSIS — Z7982 Long term (current) use of aspirin: Secondary | ICD-10-CM | POA: Diagnosis not present

## 2023-03-10 DIAGNOSIS — K219 Gastro-esophageal reflux disease without esophagitis: Secondary | ICD-10-CM | POA: Diagnosis not present

## 2023-03-10 DIAGNOSIS — I10 Essential (primary) hypertension: Secondary | ICD-10-CM | POA: Diagnosis not present

## 2023-03-10 DIAGNOSIS — C8333 Diffuse large B-cell lymphoma, intra-abdominal lymph nodes: Secondary | ICD-10-CM | POA: Diagnosis not present

## 2023-03-10 DIAGNOSIS — E785 Hyperlipidemia, unspecified: Secondary | ICD-10-CM | POA: Diagnosis not present

## 2023-03-10 DIAGNOSIS — M1990 Unspecified osteoarthritis, unspecified site: Secondary | ICD-10-CM | POA: Diagnosis not present

## 2023-03-10 DIAGNOSIS — Z556 Problems related to health literacy: Secondary | ICD-10-CM | POA: Diagnosis not present

## 2023-03-10 DIAGNOSIS — Z48815 Encounter for surgical aftercare following surgery on the digestive system: Secondary | ICD-10-CM | POA: Diagnosis not present

## 2023-03-10 DIAGNOSIS — Z85038 Personal history of other malignant neoplasm of large intestine: Secondary | ICD-10-CM | POA: Diagnosis not present

## 2023-03-12 DIAGNOSIS — Z48815 Encounter for surgical aftercare following surgery on the digestive system: Secondary | ICD-10-CM | POA: Diagnosis not present

## 2023-03-12 DIAGNOSIS — I10 Essential (primary) hypertension: Secondary | ICD-10-CM | POA: Diagnosis not present

## 2023-03-12 DIAGNOSIS — Z7982 Long term (current) use of aspirin: Secondary | ICD-10-CM | POA: Diagnosis not present

## 2023-03-12 DIAGNOSIS — Z85038 Personal history of other malignant neoplasm of large intestine: Secondary | ICD-10-CM | POA: Diagnosis not present

## 2023-03-12 DIAGNOSIS — M1990 Unspecified osteoarthritis, unspecified site: Secondary | ICD-10-CM | POA: Diagnosis not present

## 2023-03-12 DIAGNOSIS — Z556 Problems related to health literacy: Secondary | ICD-10-CM | POA: Diagnosis not present

## 2023-03-12 DIAGNOSIS — E785 Hyperlipidemia, unspecified: Secondary | ICD-10-CM | POA: Diagnosis not present

## 2023-03-12 DIAGNOSIS — C8333 Diffuse large B-cell lymphoma, intra-abdominal lymph nodes: Secondary | ICD-10-CM | POA: Diagnosis not present

## 2023-03-12 DIAGNOSIS — K219 Gastro-esophageal reflux disease without esophagitis: Secondary | ICD-10-CM | POA: Diagnosis not present

## 2023-03-13 ENCOUNTER — Ambulatory Visit: Payer: Medicare Other | Admitting: Internal Medicine

## 2023-03-13 ENCOUNTER — Encounter: Payer: Self-pay | Admitting: Internal Medicine

## 2023-03-13 VITALS — BP 142/88 | HR 95 | Temp 98.3°F | Resp 16 | Ht 64.0 in | Wt 193.6 lb

## 2023-03-13 DIAGNOSIS — L2084 Intrinsic (allergic) eczema: Secondary | ICD-10-CM

## 2023-03-13 DIAGNOSIS — I10 Essential (primary) hypertension: Secondary | ICD-10-CM

## 2023-03-13 DIAGNOSIS — E785 Hyperlipidemia, unspecified: Secondary | ICD-10-CM

## 2023-03-13 DIAGNOSIS — D539 Nutritional anemia, unspecified: Secondary | ICD-10-CM | POA: Insufficient documentation

## 2023-03-13 DIAGNOSIS — Z0000 Encounter for general adult medical examination without abnormal findings: Secondary | ICD-10-CM

## 2023-03-13 DIAGNOSIS — D75839 Thrombocytosis, unspecified: Secondary | ICD-10-CM | POA: Diagnosis not present

## 2023-03-13 DIAGNOSIS — D500 Iron deficiency anemia secondary to blood loss (chronic): Secondary | ICD-10-CM | POA: Diagnosis not present

## 2023-03-13 DIAGNOSIS — Z0001 Encounter for general adult medical examination with abnormal findings: Secondary | ICD-10-CM

## 2023-03-13 LAB — LIPID PANEL
Cholesterol: 177 mg/dL (ref 0–200)
HDL: 49.2 mg/dL (ref >39.00)
LDL Cholesterol: 94 mg/dL (ref 0–99)
NonHDL: 127.31
Total CHOL/HDL Ratio: 4
Triglycerides: 167 mg/dL — ABNORMAL HIGH (ref 0.0–149.0)
VLDL: 33.4 mg/dL (ref 0.0–40.0)

## 2023-03-13 LAB — CBC WITH DIFFERENTIAL/PLATELET
Basophils Absolute: 0 10*3/uL (ref 0.0–0.1)
Basophils Relative: 0.5 % (ref 0.0–3.0)
Eosinophils Absolute: 0.1 10*3/uL (ref 0.0–0.7)
Eosinophils Relative: 3 % (ref 0.0–5.0)
HCT: 37.3 % (ref 36.0–46.0)
Hemoglobin: 11.8 g/dL — ABNORMAL LOW (ref 12.0–15.0)
Lymphocytes Relative: 23 % (ref 12.0–46.0)
Lymphs Abs: 1.1 10*3/uL (ref 0.7–4.0)
MCHC: 31.6 g/dL (ref 30.0–36.0)
MCV: 95 fL (ref 78.0–100.0)
Monocytes Absolute: 0.5 10*3/uL (ref 0.1–1.0)
Monocytes Relative: 10.6 % (ref 3.0–12.0)
Neutro Abs: 3.1 10*3/uL (ref 1.4–7.7)
Neutrophils Relative %: 62.9 % (ref 43.0–77.0)
Platelets: 426 10*3/uL — ABNORMAL HIGH (ref 150.0–400.0)
RBC: 3.93 Mil/uL (ref 3.87–5.11)
RDW: 15.4 % (ref 11.5–15.5)
WBC: 5 10*3/uL (ref 4.0–10.5)

## 2023-03-13 LAB — TSH: TSH: 1.28 u[IU]/mL (ref 0.35–5.50)

## 2023-03-13 LAB — IBC + FERRITIN
Ferritin: 17.2 ng/mL (ref 10.0–291.0)
Iron: 36 ug/dL — ABNORMAL LOW (ref 42–145)
Saturation Ratios: 11.6 % — ABNORMAL LOW (ref 20.0–50.0)
TIBC: 309.4 ug/dL (ref 250.0–450.0)
Transferrin: 221 mg/dL (ref 212.0–360.0)

## 2023-03-13 MED ORDER — ACCRUFER 30 MG PO CAPS
1.0000 | ORAL_CAPSULE | Freq: Two times a day (BID) | ORAL | 0 refills | Status: DC
Start: 2023-03-13 — End: 2023-09-16

## 2023-03-13 NOTE — Patient Instructions (Signed)

## 2023-03-15 DIAGNOSIS — Z556 Problems related to health literacy: Secondary | ICD-10-CM | POA: Diagnosis not present

## 2023-03-15 DIAGNOSIS — Z48815 Encounter for surgical aftercare following surgery on the digestive system: Secondary | ICD-10-CM | POA: Diagnosis not present

## 2023-03-15 DIAGNOSIS — M1990 Unspecified osteoarthritis, unspecified site: Secondary | ICD-10-CM | POA: Diagnosis not present

## 2023-03-15 DIAGNOSIS — Z7982 Long term (current) use of aspirin: Secondary | ICD-10-CM | POA: Diagnosis not present

## 2023-03-15 DIAGNOSIS — E785 Hyperlipidemia, unspecified: Secondary | ICD-10-CM | POA: Diagnosis not present

## 2023-03-15 DIAGNOSIS — I10 Essential (primary) hypertension: Secondary | ICD-10-CM | POA: Diagnosis not present

## 2023-03-15 DIAGNOSIS — K219 Gastro-esophageal reflux disease without esophagitis: Secondary | ICD-10-CM | POA: Diagnosis not present

## 2023-03-15 DIAGNOSIS — C8333 Diffuse large B-cell lymphoma, intra-abdominal lymph nodes: Secondary | ICD-10-CM | POA: Diagnosis not present

## 2023-03-15 DIAGNOSIS — Z85038 Personal history of other malignant neoplasm of large intestine: Secondary | ICD-10-CM | POA: Diagnosis not present

## 2023-03-19 DIAGNOSIS — C8333 Diffuse large B-cell lymphoma, intra-abdominal lymph nodes: Secondary | ICD-10-CM | POA: Diagnosis not present

## 2023-03-19 DIAGNOSIS — I10 Essential (primary) hypertension: Secondary | ICD-10-CM | POA: Diagnosis not present

## 2023-03-19 DIAGNOSIS — Z48815 Encounter for surgical aftercare following surgery on the digestive system: Secondary | ICD-10-CM | POA: Diagnosis not present

## 2023-03-19 DIAGNOSIS — M1990 Unspecified osteoarthritis, unspecified site: Secondary | ICD-10-CM | POA: Diagnosis not present

## 2023-03-19 DIAGNOSIS — Z7982 Long term (current) use of aspirin: Secondary | ICD-10-CM | POA: Diagnosis not present

## 2023-03-19 DIAGNOSIS — Z556 Problems related to health literacy: Secondary | ICD-10-CM | POA: Diagnosis not present

## 2023-03-19 DIAGNOSIS — Z85038 Personal history of other malignant neoplasm of large intestine: Secondary | ICD-10-CM | POA: Diagnosis not present

## 2023-03-19 DIAGNOSIS — E785 Hyperlipidemia, unspecified: Secondary | ICD-10-CM | POA: Diagnosis not present

## 2023-03-19 DIAGNOSIS — K219 Gastro-esophageal reflux disease without esophagitis: Secondary | ICD-10-CM | POA: Diagnosis not present

## 2023-03-21 DIAGNOSIS — Z85038 Personal history of other malignant neoplasm of large intestine: Secondary | ICD-10-CM | POA: Diagnosis not present

## 2023-03-21 DIAGNOSIS — M1990 Unspecified osteoarthritis, unspecified site: Secondary | ICD-10-CM | POA: Diagnosis not present

## 2023-03-21 DIAGNOSIS — K219 Gastro-esophageal reflux disease without esophagitis: Secondary | ICD-10-CM | POA: Diagnosis not present

## 2023-03-21 DIAGNOSIS — C8333 Diffuse large B-cell lymphoma, intra-abdominal lymph nodes: Secondary | ICD-10-CM | POA: Diagnosis not present

## 2023-03-21 DIAGNOSIS — Z7982 Long term (current) use of aspirin: Secondary | ICD-10-CM | POA: Diagnosis not present

## 2023-03-21 DIAGNOSIS — Z556 Problems related to health literacy: Secondary | ICD-10-CM | POA: Diagnosis not present

## 2023-03-21 DIAGNOSIS — I10 Essential (primary) hypertension: Secondary | ICD-10-CM | POA: Diagnosis not present

## 2023-03-21 DIAGNOSIS — Z48815 Encounter for surgical aftercare following surgery on the digestive system: Secondary | ICD-10-CM | POA: Diagnosis not present

## 2023-03-21 DIAGNOSIS — E785 Hyperlipidemia, unspecified: Secondary | ICD-10-CM | POA: Diagnosis not present

## 2023-03-25 ENCOUNTER — Telehealth: Payer: Self-pay | Admitting: Internal Medicine

## 2023-03-25 NOTE — Telephone Encounter (Signed)
Pt dropped off stool sample downstairs to lab. Pt wanted to notify her doctor.  Thanks

## 2023-03-25 NOTE — Telephone Encounter (Signed)
Noted. Thanks.

## 2023-03-26 ENCOUNTER — Other Ambulatory Visit (INDEPENDENT_AMBULATORY_CARE_PROVIDER_SITE_OTHER): Payer: Medicare Other

## 2023-03-26 DIAGNOSIS — D500 Iron deficiency anemia secondary to blood loss (chronic): Secondary | ICD-10-CM

## 2023-03-26 LAB — HEMOCCULT SLIDES (X 3 CARDS)
Fecal Occult Blood: NEGATIVE
OCCULT 1: NEGATIVE
OCCULT 2: NEGATIVE
OCCULT 3: NEGATIVE
OCCULT 4: NEGATIVE
OCCULT 5: NEGATIVE

## 2023-03-28 DIAGNOSIS — Z48815 Encounter for surgical aftercare following surgery on the digestive system: Secondary | ICD-10-CM | POA: Diagnosis not present

## 2023-03-28 DIAGNOSIS — Z556 Problems related to health literacy: Secondary | ICD-10-CM | POA: Diagnosis not present

## 2023-03-28 DIAGNOSIS — E785 Hyperlipidemia, unspecified: Secondary | ICD-10-CM | POA: Diagnosis not present

## 2023-03-28 DIAGNOSIS — C8333 Diffuse large B-cell lymphoma, intra-abdominal lymph nodes: Secondary | ICD-10-CM | POA: Diagnosis not present

## 2023-03-28 DIAGNOSIS — K219 Gastro-esophageal reflux disease without esophagitis: Secondary | ICD-10-CM | POA: Diagnosis not present

## 2023-03-28 DIAGNOSIS — I10 Essential (primary) hypertension: Secondary | ICD-10-CM | POA: Diagnosis not present

## 2023-03-28 DIAGNOSIS — Z7982 Long term (current) use of aspirin: Secondary | ICD-10-CM | POA: Diagnosis not present

## 2023-03-28 DIAGNOSIS — Z85038 Personal history of other malignant neoplasm of large intestine: Secondary | ICD-10-CM | POA: Diagnosis not present

## 2023-03-28 DIAGNOSIS — M1990 Unspecified osteoarthritis, unspecified site: Secondary | ICD-10-CM | POA: Diagnosis not present

## 2023-04-01 DIAGNOSIS — E785 Hyperlipidemia, unspecified: Secondary | ICD-10-CM | POA: Diagnosis not present

## 2023-04-01 DIAGNOSIS — C8333 Diffuse large B-cell lymphoma, intra-abdominal lymph nodes: Secondary | ICD-10-CM | POA: Diagnosis not present

## 2023-04-01 DIAGNOSIS — K219 Gastro-esophageal reflux disease without esophagitis: Secondary | ICD-10-CM | POA: Diagnosis not present

## 2023-04-01 DIAGNOSIS — M1990 Unspecified osteoarthritis, unspecified site: Secondary | ICD-10-CM | POA: Diagnosis not present

## 2023-04-01 DIAGNOSIS — I10 Essential (primary) hypertension: Secondary | ICD-10-CM | POA: Diagnosis not present

## 2023-04-01 DIAGNOSIS — Z48815 Encounter for surgical aftercare following surgery on the digestive system: Secondary | ICD-10-CM | POA: Diagnosis not present

## 2023-04-01 DIAGNOSIS — Z7982 Long term (current) use of aspirin: Secondary | ICD-10-CM | POA: Diagnosis not present

## 2023-04-01 DIAGNOSIS — Z85038 Personal history of other malignant neoplasm of large intestine: Secondary | ICD-10-CM | POA: Diagnosis not present

## 2023-04-01 DIAGNOSIS — Z556 Problems related to health literacy: Secondary | ICD-10-CM | POA: Diagnosis not present

## 2023-04-04 ENCOUNTER — Other Ambulatory Visit: Payer: Self-pay | Admitting: Internal Medicine

## 2023-04-04 ENCOUNTER — Telehealth: Payer: Self-pay | Admitting: Internal Medicine

## 2023-04-04 DIAGNOSIS — J3089 Other allergic rhinitis: Secondary | ICD-10-CM

## 2023-04-04 DIAGNOSIS — J301 Allergic rhinitis due to pollen: Secondary | ICD-10-CM

## 2023-04-04 MED ORDER — FLUTICASONE PROPIONATE 50 MCG/ACT NA SUSP
2.0000 | Freq: Every day | NASAL | 1 refills | Status: DC
Start: 2023-04-04 — End: 2023-09-24

## 2023-04-04 NOTE — Telephone Encounter (Signed)
Patient has been buying flonase over the counter.  She wants to know if Dr. Yetta Barre will send in an rx for Flonase.  Please send to [facility] garden

## 2023-04-07 ENCOUNTER — Other Ambulatory Visit: Payer: Self-pay | Admitting: Internal Medicine

## 2023-04-07 DIAGNOSIS — K219 Gastro-esophageal reflux disease without esophagitis: Secondary | ICD-10-CM

## 2023-04-18 ENCOUNTER — Inpatient Hospital Stay: Payer: Medicare Other | Attending: Hematology and Oncology | Admitting: Hematology and Oncology

## 2023-04-18 ENCOUNTER — Inpatient Hospital Stay: Payer: Medicare Other

## 2023-04-18 VITALS — BP 135/63 | HR 94 | Temp 97.4°F | Resp 18 | Wt 194.8 lb

## 2023-04-18 DIAGNOSIS — D649 Anemia, unspecified: Secondary | ICD-10-CM | POA: Diagnosis not present

## 2023-04-18 DIAGNOSIS — E876 Hypokalemia: Secondary | ICD-10-CM | POA: Insufficient documentation

## 2023-04-18 DIAGNOSIS — C8309 Small cell B-cell lymphoma, extranodal and solid organ sites: Secondary | ICD-10-CM | POA: Diagnosis not present

## 2023-04-18 DIAGNOSIS — C8580 Other specified types of non-Hodgkin lymphoma, unspecified site: Secondary | ICD-10-CM

## 2023-04-18 LAB — CMP (CANCER CENTER ONLY)
ALT: 7 U/L (ref 0–44)
AST: 11 U/L — ABNORMAL LOW (ref 15–41)
Albumin: 4.1 g/dL (ref 3.5–5.0)
Alkaline Phosphatase: 100 U/L (ref 38–126)
Anion gap: 6 (ref 5–15)
BUN: 10 mg/dL (ref 8–23)
CO2: 27 mmol/L (ref 22–32)
Calcium: 10.7 mg/dL — ABNORMAL HIGH (ref 8.9–10.3)
Chloride: 109 mmol/L (ref 98–111)
Creatinine: 0.85 mg/dL (ref 0.44–1.00)
GFR, Estimated: >60 mL/min (ref >60)
Glucose, Bld: 101 mg/dL — ABNORMAL HIGH (ref 70–99)
Potassium: 3.8 mmol/L (ref 3.5–5.1)
Sodium: 142 mmol/L (ref 135–145)
Total Bilirubin: 0.3 mg/dL (ref <1.2)
Total Protein: 7.3 g/dL (ref 6.5–8.1)

## 2023-04-18 LAB — CBC WITH DIFFERENTIAL/PLATELET
Abs Immature Granulocytes: 0.01 10*3/uL (ref 0.00–0.07)
Basophils Absolute: 0 10*3/uL (ref 0.0–0.1)
Basophils Relative: 0 %
Eosinophils Absolute: 0.1 10*3/uL (ref 0.0–0.5)
Eosinophils Relative: 3 %
HCT: 38.6 % (ref 36.0–46.0)
Hemoglobin: 12.4 g/dL (ref 12.0–15.0)
Immature Granulocytes: 0 %
Lymphocytes Relative: 29 %
Lymphs Abs: 1.3 10*3/uL (ref 0.7–4.0)
MCH: 30.3 pg (ref 26.0–34.0)
MCHC: 32.1 g/dL (ref 30.0–36.0)
MCV: 94.4 fL (ref 80.0–100.0)
Monocytes Absolute: 0.5 10*3/uL (ref 0.1–1.0)
Monocytes Relative: 11 %
Neutro Abs: 2.4 10*3/uL (ref 1.7–7.7)
Neutrophils Relative %: 57 %
Platelets: 339 10*3/uL (ref 150–400)
RBC: 4.09 MIL/uL (ref 3.87–5.11)
RDW: 14.6 % (ref 11.5–15.5)
WBC: 4.3 10*3/uL (ref 4.0–10.5)
nRBC: 0 % (ref 0.0–0.2)

## 2023-04-18 LAB — LACTATE DEHYDROGENASE: LDH: 141 U/L (ref 98–192)

## 2023-04-18 NOTE — Progress Notes (Signed)
[facility] FOLLOW UP NOTE  Patient Care Team: Etta Grandchild, MD as PCP - General (Internal Medicine) Norva Pavlov, OD as Consulting Physician (Optometry) Szabat, Vinnie Level, [facility] (Inactive) as Pharmacist (Pharmacist)  CHIEF COMPLAINTS/PURPOSE OF CONSULTATION:  Marginal zone lymphoma  ASSESSMENT & PLAN:   This is a very 67 yr old female pt with new diagnosis of small bowel marginal zone lymphoma here for hospital follow up.  Marginal Zone Lymphoma Indolent lymphoma, staging incomplete. Discussed plan for further staging with PET scan now that wound is healed. Also discussed potential treatment with Rituximab, a CD20 antibody, after staging. -PET ordered.  Postoperative Wound Healing Wound from recent procedure is nearly closed. No signs of infection or complications. -Continue current wound care regimen.  Hypokalemia Previously low potassium levels. Patient reports compliance with potassium supplementation. -Check potassium levels today.  Anemia Patient is on iron supplementation (Acryfra). -Check complete blood count today.  Follow-up in 1 month after PET scan results are available.  HISTORY OF PRESENTING ILLNESS:  Kayla Lowe 67 y.o. female is here because of marginal zone lymphoma of small bowel.  Oncology History   No history exists.    INTERIM HISTORY  The patient, with a history of marginal zone lymphoma, presents for follow-up after a recent procedure. She reports that the wound from the procedure is 'almost closed, but not quite.' She has been managing the wound with silver nitrate and dry gauze. She has been recovering at home since October 25th and reports no need for home health assistance. She denies any fevers or drenching night sweats.  The patient also reports a history of low potassium, which she has been managing with supplements. She also has a history of anemia, which she is managing with iron supplements. She reports no  current symptoms related to these conditions.  REVIEW OF SYSTEMS:   Constitutional: Denies fevers, chills or abnormal night sweats Eyes: Denies blurriness of vision, double vision or watery eyes Ears, nose, mouth, throat, and face: Denies mucositis or sore throat Respiratory: Denies cough, dyspnea or wheezes Cardiovascular: Denies palpitation, chest discomfort or lower extremity swelling Gastrointestinal:  Denies nausea, heartburn or change in bowel habits Skin: Denies abnormal skin rashes Lymphatics: Denies new lymphadenopathy or easy bruising Neurological:Denies numbness, tingling or new weaknesses Behavioral/Psych: Mood is stable, no new changes  All other systems were reviewed with the patient and are negative.   MEDICAL HISTORY:  Past Medical History:  Diagnosis Date   GERD (gastroesophageal reflux disease)    Hyperlipidemia    Hypertension    Osteoarthritis     SURGICAL HISTORY: Past Surgical History:  Procedure Laterality Date   ABDOMINAL HYSTERECTOMY  1987   LAPAROTOMY N/A 02/04/2023   Procedure: EXPLORATORY LAPAROTOMY, WITH SMALL BOWEL RESECTION;  Surgeon: Abigail Miyamoto, MD;  Location: [facility];  Service: General;  Laterality: N/A;    SOCIAL HISTORY: Social History   Socioeconomic History   Marital status: Legally Separated    Spouse name: Not on file   Number of children: Not on file   Years of education: Not on file   Highest education level: Not on file  Occupational History   Not on file  Tobacco Use   Smoking status: Never   Smokeless tobacco: Never  Substance and Sexual Activity   Alcohol use: No   Drug use: No   Sexual activity: Not Currently  Other Topics Concern   Not on file  Social History Narrative   Not on file   Social Determinants  of Health   Financial Resource Strain: Low Risk  (03/22/2022)   Overall Financial Resource Strain (CARDIA)    Difficulty of Paying Living Expenses: Not hard at all  Food Insecurity: No Food Insecurity  (02/03/2023)   Hunger Vital Sign    Worried About Running Out of Food in the Last Year: Never true    Ran Out of Food in the Last Year: Never true  Transportation Needs: No Transportation Needs (02/03/2023)   PRAPARE - Administrator, Civil Service (Medical): No    Lack of Transportation (Non-Medical): No  Physical Activity: Inactive (03/22/2022)   Exercise Vital Sign    Days of Exercise per Week: 0 days    Minutes of Exercise per Session: 0 min  Stress: No Stress Concern Present (03/22/2022)   [employer] of Occupational Health - Occupational Stress Questionnaire    Feeling of Stress : Not at all  Social Connections: Moderately Integrated (03/22/2022)   Social Connection and Isolation Panel [NHANES]    Frequency of Communication with Friends and Family: More than three times a week    Frequency of Social Gatherings with Friends and Family: More than three times a week    Attends Religious Services: More than 4 times per year    Active Member of [employer] or Organizations: No    Attends Engineer, structural: More than 4 times per year    Marital Status: Separated  Intimate Partner Violence: Not At Risk (02/03/2023)   Humiliation, Afraid, Rape, and Kick questionnaire    Fear of Current or Ex-Partner: No    Emotionally Abused: No    Physically Abused: No    Sexually Abused: No    FAMILY HISTORY: Family History  Problem Relation Age of Onset   Cancer Father    Hypertension Brother    Diabetes Brother    Diabetes Paternal Grandfather    Diabetes Paternal Uncle    Colon cancer Neg Hx    Hypercalcemia Neg Hx     ALLERGIES:  is allergic to cozaar [losartan potassium], lisinopril, and statins.  MEDICATIONS:  Current Outpatient Medications  Medication Sig Dispense Refill   acetaminophen (TYLENOL) 500 MG tablet Take 2 tablets (1,000 mg total) by mouth 4 (four) times daily. 30 tablet 0   amLODipine (NORVASC) 10 MG tablet TAKE 1 TABLET(10 MG) BY MOUTH DAILY 90  tablet 1   aspirin EC 81 MG tablet Take 81 mg by mouth daily. Swallow whole.     carvedilol (COREG) 6.25 MG tablet Take 1 tablet (6.25 mg total) by mouth 2 (two) times daily with a meal. 180 tablet 1   cetirizine (ZYRTEC) 10 MG tablet Take 10 mg by mouth daily.     Cholecalciferol (VITAMIN D) 50 MCG (2000 UT) CAPS Take 1 capsule by mouth daily.     cyclobenzaprine (FLEXERIL) 10 MG tablet TAKE 1 TABLET(10 MG) BY MOUTH THREE TIMES DAILY AS NEEDED FOR MUSCLE SPASMS 30 tablet 2   ezetimibe (ZETIA) 10 MG tablet TAKE 1 TABLET(10 MG) BY MOUTH DAILY 90 tablet 0   famotidine (PEPCID) 40 MG tablet TAKE 1 TABLET(40 MG) BY MOUTH AT BEDTIME 90 tablet 1   Ferric Maltol (ACCRUFER) 30 MG CAPS Take 1 capsule (30 mg total) by mouth in the morning and at bedtime. 180 capsule 0   fluticasone (FLONASE) 50 MCG/ACT nasal spray Place 2 sprays into both nostrils daily. 48 g 1   hydrALAZINE (APRESOLINE) 25 MG tablet TAKE 1 TABLET(25 MG) BY MOUTH THREE TIMES DAILY  270 tablet 1   montelukast (SINGULAIR) 10 MG tablet Take 1 tablet (10 mg total) by mouth at bedtime. 90 tablet 1   pantoprazole (PROTONIX) 20 MG tablet TAKE 1 TABLET(20 MG) BY MOUTH DAILY 90 tablet 1   potassium chloride SA (KLOR-CON M) 20 MEQ tablet Take 2 tablets (40 mEq total) by mouth daily. 30 tablet 0   pravastatin (PRAVACHOL) 40 MG tablet Take 1 tablet (40 mg total) by mouth daily. 90 tablet 1   traMADol (ULTRAM) 50 MG tablet Take 1-2 tablets (50-100 mg total) by mouth every 6 (six) hours as needed for moderate pain or severe pain. 30 tablet 0   No current facility-administered medications for this visit.      PHYSICAL EXAMINATION: ECOG PERFORMANCE STATUS: 2 - Symptomatic, <50% confined to bed  Vitals:   04/18/23 1015  BP: 135/63  Pulse: 94  Resp: 18  Temp: (!) 97.4 F (36.3 C)  SpO2: 97%   Filed Weights   04/18/23 1015  Weight: 194 lb 12.8 oz (88.4 kg)    GENERAL:alert, no distress and comfortable Neck: No regional adenopathy ABDOMEN:  Wound in the abdomen significantly better, no gaping. No evidence of infection. Musculoskeletal:no cyanosis of digits and no clubbing  PSYCH: alert & oriented x 3 with fluent speech NEURO: no focal motor/sensory deficits  LABORATORY DATA:  I have reviewed the data as listed Lab Results  Component Value Date   WBC 5.0 03/13/2023   HGB 11.8 (L) 03/13/2023   HCT 37.3 03/13/2023   MCV 95.0 03/13/2023   PLT 426.0 (H) 03/13/2023     Chemistry      Component Value Date/Time   NA 140 02/22/2023 1056   NA 145 (H) 07/29/2018 1112   K 4.6 02/22/2023 1056   CL 107 02/22/2023 1056   CO2 28 02/22/2023 1056   BUN 9 02/22/2023 1056   BUN 8 07/29/2018 1112   CREATININE 0.95 02/22/2023 1056   CREATININE 0.87 08/03/2015 0942      Component Value Date/Time   CALCIUM 10.2 02/22/2023 1056   ALKPHOS 107 02/22/2023 1056   AST 14 (L) 02/22/2023 1056   ALT 18 02/22/2023 1056   BILITOT 0.4 02/22/2023 1056       RADIOGRAPHIC STUDIES: I have personally reviewed the radiological images as listed and agreed with the findings in the report. No results found.  All questions were answered. The patient knows to call the clinic with any problems, questions or concerns.     Rachel Moulds, MD 04/18/2023 10:25 AM

## 2023-04-22 ENCOUNTER — Other Ambulatory Visit: Payer: Self-pay | Admitting: Internal Medicine

## 2023-04-22 ENCOUNTER — Telehealth: Payer: Self-pay | Admitting: Internal Medicine

## 2023-04-22 ENCOUNTER — Encounter: Payer: Self-pay | Admitting: Internal Medicine

## 2023-04-22 ENCOUNTER — Ambulatory Visit (INDEPENDENT_AMBULATORY_CARE_PROVIDER_SITE_OTHER): Payer: Medicare Other | Admitting: Internal Medicine

## 2023-04-22 VITALS — BP 116/78 | HR 93 | Temp 98.0°F | Resp 16 | Ht 64.0 in | Wt 194.8 lb

## 2023-04-22 DIAGNOSIS — G8929 Other chronic pain: Secondary | ICD-10-CM | POA: Diagnosis not present

## 2023-04-22 DIAGNOSIS — M545 Low back pain, unspecified: Secondary | ICD-10-CM

## 2023-04-22 DIAGNOSIS — I10 Essential (primary) hypertension: Secondary | ICD-10-CM

## 2023-04-22 DIAGNOSIS — T8189XD Other complications of procedures, not elsewhere classified, subsequent encounter: Secondary | ICD-10-CM

## 2023-04-22 DIAGNOSIS — R252 Cramp and spasm: Secondary | ICD-10-CM | POA: Diagnosis not present

## 2023-04-22 MED ORDER — TIZANIDINE HCL 2 MG PO TABS: 2.0000 mg | ORAL_TABLET | Freq: Three times a day (TID) | ORAL | 0 refills | Status: DC | PRN

## 2023-04-22 NOTE — Progress Notes (Addendum)
Subjective:  Patient ID: Kayla Lowe, female    DOB: 09-08-1955  Age: 67 y.o. MRN: [id number]  CC: Hypertension, Cough, Hyperlipidemia, and Back Pain   HPI Kayla Lowe presents for f/up  ---  Discussed the use of AI scribe software for clinical note transcription with the patient, who gave verbal consent to proceed.  History of Present Illness   The patient presents with an intermittent cough that has been present for approximately two months. The cough is described as productive with white, foamy sputum, but no hemoptysis. The cough onset coincided with the initiation of pravastatin therapy, leading the patient to suspect a correlation. The patient denies any associated chest pain, wheezing, or shortness of breath. She has a remote history of smoking.  The patient also reports back pain, which was partially relieved with Tylenol. She denies any associated fever, chills, or night sweats.  In addition, the patient has been experiencing heartburn and indigestion, which have been managed with a combination of Pepcid and pantoprazole. She also reports the urge to clear her throat frequently but denies any painful or difficult swallowing.  The patient has been on iron supplementation for an unspecified condition, which took three weeks to arrive and was costly. She also reports taking amlodipine 10mg  for hypertension, but has noticed episodes of low blood pressure. She denies any associated symptoms such as dizziness or lightheadedness.  Despite these health concerns, the patient denies any unintentional weight loss and is actively trying to lose weight.       She has been using paper tape and gauze supplies with good results. Please continue the current supplies.  Outpatient Medications Prior to Visit  Medication Sig Dispense Refill   acetaminophen (TYLENOL) 500 MG tablet Take 2 tablets (1,000 mg total) by mouth 4 (four) times daily. (Patient taking  differently: Take 1,000 mg by mouth as needed for mild pain (pain score 1-3), moderate pain (pain score 4-6), fever or headache.) 30 tablet 0   aspirin EC 81 MG tablet Take 81 mg by mouth daily. Swallow whole.     carvedilol (COREG) 6.25 MG tablet Take 1 tablet (6.25 mg total) by mouth 2 (two) times daily with a meal. 180 tablet 1   cetirizine (ZYRTEC) 10 MG tablet Take 10 mg by mouth daily.     Cholecalciferol (VITAMIN D) 50 MCG (2000 UT) CAPS Take 1 capsule by mouth daily.     ezetimibe (ZETIA) 10 MG tablet TAKE 1 TABLET(10 MG) BY MOUTH DAILY 90 tablet 0   famotidine (PEPCID) 40 MG tablet TAKE 1 TABLET(40 MG) BY MOUTH AT BEDTIME 90 tablet 1   Ferric Maltol (ACCRUFER) 30 MG CAPS Take 1 capsule (30 mg total) by mouth in the morning and at bedtime. 180 capsule 0   fluticasone (FLONASE) 50 MCG/ACT nasal spray Place 2 sprays into both nostrils daily. 48 g 1   hydrALAZINE (APRESOLINE) 25 MG tablet TAKE 1 TABLET(25 MG) BY MOUTH THREE TIMES DAILY 270 tablet 1   montelukast (SINGULAIR) 10 MG tablet Take 1 tablet (10 mg total) by mouth at bedtime. 90 tablet 1   pantoprazole (PROTONIX) 20 MG tablet TAKE 1 TABLET(20 MG) BY MOUTH DAILY 90 tablet 1   pravastatin (PRAVACHOL) 40 MG tablet Take 1 tablet (40 mg total) by mouth daily. 90 tablet 1   triamcinolone cream (KENALOG) 0.5 % Apply 1 Application topically as needed.     amLODipine (NORVASC) 10 MG tablet TAKE 1 TABLET(10 MG) BY MOUTH DAILY 90 tablet 1   cyclobenzaprine (  FLEXERIL) 10 MG tablet TAKE 1 TABLET(10 MG) BY MOUTH THREE TIMES DAILY AS NEEDED FOR MUSCLE SPASMS 30 tablet 2   traMADol (ULTRAM) 50 MG tablet Take 1-2 tablets (50-100 mg total) by mouth every 6 (six) hours as needed for moderate pain or severe pain. (Patient not taking: Reported on 04/22/2023) 30 tablet 0   potassium chloride SA (KLOR-CON M) 20 MEQ tablet Take 2 tablets (40 mEq total) by mouth daily. (Patient not taking: Reported on 04/22/2023) 30 tablet 0   No facility-administered  medications prior to visit.    ROS Review of Systems  Constitutional:  Negative for appetite change, chills, diaphoresis, fatigue and fever.  HENT: Negative.  Negative for trouble swallowing.   Eyes: Negative.   Respiratory:  Negative for chest tightness, shortness of breath and wheezing.   Cardiovascular:  Negative for chest pain, palpitations and leg swelling.  Gastrointestinal:  Negative for abdominal pain, constipation, diarrhea, nausea and vomiting.  Endocrine: Negative.   Genitourinary: Negative.  Negative for decreased urine volume and difficulty urinating.  Musculoskeletal:  Positive for back pain and myalgias. Negative for gait problem.  Neurological:  Negative for dizziness and weakness.  Hematological:  Negative for adenopathy. Does not bruise/bleed easily.  Psychiatric/Behavioral: Negative.      Objective:  BP 116/78 (BP Location: Left Arm, Patient Position: Sitting, Cuff Size: Normal)   Pulse 93   Temp 98 F (36.7 C) (Oral)   Resp 16   Ht 5\' 4"  (1.626 m)   Wt 194 lb 12.8 oz (88.4 kg)   SpO2 100%   BMI 33.44 kg/m   BP Readings from Last 3 Encounters:  04/22/23 116/78  04/18/23 135/63  03/13/23 (!) 142/88    Wt Readings from Last 3 Encounters:  04/22/23 194 lb 12.8 oz (88.4 kg)  04/18/23 194 lb 12.8 oz (88.4 kg)  03/13/23 193 lb 9.6 oz (87.8 kg)    Physical Exam Vitals reviewed.  Constitutional:      Appearance: Normal appearance.  HENT:     Mouth/Throat:     Mouth: Mucous membranes are moist.  Eyes:     General: No scleral icterus.    Conjunctiva/sclera: Conjunctivae normal.  Cardiovascular:     Rate and Rhythm: Normal rate and regular rhythm.     Heart sounds: No murmur heard. Pulmonary:     Effort: Pulmonary effort is normal.     Breath sounds: No stridor. No wheezing, rhonchi or rales.  Abdominal:     General: Abdomen is flat.     Palpations: There is no mass.     Tenderness: There is no abdominal tenderness. There is no guarding.      Hernia: No hernia is present.  Musculoskeletal:        General: Normal range of motion.     Cervical back: Neck supple.     Right lower leg: No edema.     Left lower leg: No edema.  Lymphadenopathy:     Cervical: No cervical adenopathy.  Skin:    General: Skin is dry.  Neurological:     General: No focal deficit present.     Mental Status: She is alert. Mental status is at baseline.  Psychiatric:        Mood and Affect: Mood normal.        Behavior: Behavior normal.     Lab Results  Component Value Date   WBC 4.3 04/18/2023   HGB 12.4 04/18/2023   HCT 38.6 04/18/2023   PLT 339 04/18/2023  GLUCOSE 101 (H) 04/18/2023   CHOL 177 03/13/2023   TRIG 167.0 (H) 03/13/2023   HDL 49.20 03/13/2023   LDLDIRECT 220.0 09/16/2019   LDLCALC 94 03/13/2023   ALT 7 04/18/2023   AST 11 (L) 04/18/2023   NA 142 04/18/2023   K 3.8 04/18/2023   CL 109 04/18/2023   CREATININE 0.85 04/18/2023   BUN 10 04/18/2023   CO2 27 04/18/2023   TSH 1.28 03/13/2023   INR 0.97 07/10/2013   HGBA1C 5.4 08/31/2019    DG Abd Portable 1V-Small Bowel Obstruction Protocol-initial, 8 hr delay  Result Date: 02/04/2023 CLINICAL DATA:  Follow-up small bowel obstruction. 8 hour delay imaging. EXAM: PORTABLE ABDOMEN - 1 VIEW COMPARISON:  02/03/2023 . FINDINGS: there is an enteric tube with tip and side port in the stomach. Persistent dilated small bowel loops are identified which measure up to 3.8 cm. Gas and stool noted within normal caliber colon. IMPRESSION: Persistent small bowel obstruction pattern. Electronically Signed   By: Signa Kell M.D.   On: 02/04/2023 05:43   DG Abd Portable 1V-Small Bowel Protocol-Position Verification  Result Date: 02/03/2023 CLINICAL DATA:  Enteric tube placement EXAM: PORTABLE ABDOMEN - 1 VIEW COMPARISON:  Same day CT abdomen and pelvis FINDINGS: Gastric/enteric tube tip projects over the stomach. Partially imaged dilated small bowel loops in the right hemiabdomen. IMPRESSION:  Gastric/enteric tube tip projects over the stomach. Electronically Signed   By: Agustin Cree M.D.   On: 02/03/2023 19:27   CT ABDOMEN PELVIS W CONTRAST  Result Date: 02/03/2023 CLINICAL DATA:  Bilateral upper quadrant pain. Nausea, vomiting, and diarrhea for 2 days. EXAM: CT ABDOMEN AND PELVIS WITH CONTRAST TECHNIQUE: Multidetector CT imaging of the abdomen and pelvis was performed using the standard protocol following bolus administration of intravenous contrast. RADIATION DOSE REDUCTION: This exam was performed according to the departmental dose-optimization program which includes automated exposure control, adjustment of the mA and/or kV according to patient size and/or use of iterative reconstruction technique. CONTRAST:  OMNIPAQUE IOHEXOL 300 MG/ML  SOLN COMPARISON:  07/10/2013 FINDINGS: Lower Chest: No acute findings. Hepatobiliary: No suspicious hepatic masses identified. Gallbladder is unremarkable. No evidence of biliary ductal dilatation. Pancreas:  No mass or inflammatory changes. Spleen: Within normal limits in size and appearance. Adrenals/Urinary Tract: No suspicious masses identified. No evidence of ureteral calculi or hydronephrosis. Stomach/Bowel: Multiple moderately dilated small bowel loops are seen. A transition point is seen in the central small bowel mesentery which shows tethering, suspicious for adhesion. A markedly dilated small bowel loop is seen in the left abdomen which shows wall thickening and is suspicious for a closed loop obstruction. No evidence of pneumatosis, free intraperitoneal air, or abscess. Normal appendix visualized. Diverticulosis is seen mainly involving the descending and sigmoid colon, however there is no evidence of diverticulitis. Vascular/Lymphatic: No pathologically enlarged lymph nodes. No acute vascular findings. Reproductive: Prior hysterectomy noted. Adnexal regions are unremarkable in appearance. Tiny amount of free fluid noted in pelvic cul-de-sac.  Other:  None. Musculoskeletal:  No suspicious bone lesions identified. IMPRESSION: Small bowel obstruction, with transition point in central small bowel mesentery suspicious for adhesion. Markedly dilated small bowel loop in left abdomen mild wall thickening, raising suspicion for closed loop obstruction. Surgical consultation is recommended. No evidence of pneumatosis, free air, or abscess. Colonic diverticulosis, without radiographic evidence of diverticulitis. Electronically Signed   By: Danae Orleans M.D.   On: 02/03/2023 17:36    Assessment & Plan:   Chronic bilateral low back pain without sciatica -  tiZANidine HCl; Take 1 tablet (2 mg total) by mouth every 8 (eight) hours as needed for muscle spasms.  Dispense: 270 tablet; Refill: 0  Nocturnal muscle cramps -     tiZANidine HCl; Take 1 tablet (2 mg total) by mouth every 8 (eight) hours as needed for muscle spasms.  Dispense: 270 tablet; Refill: 0  Essential hypertension, benign - Her BP is over-controlled. Will discontinue the CCB.    Follow-up: Return in about 6 months (around 10/21/2023).  Sanda Linger, MD

## 2023-04-22 NOTE — Telephone Encounter (Signed)
Prescription Request  04/22/2023  LOV: 04/22/2023  What is the name of the medication or equipment? Cyclobensaprine - flexeril  Have you contacted your pharmacy to request a refill? Yes   Which pharmacy would you like this sent to?  [id number] [place], [place] - [address] AT [facility] [address] [place] [address] Phone: [phone] Fax: [phone]     Patient notified that their request is being sent to the clinical staff for review and that they should receive a response within 2 business days.   Please advise at Mobile [phone] (mobile)

## 2023-04-22 NOTE — Patient Instructions (Signed)
Hypertension, Adult High blood pressure (hypertension) is when the force of blood pumping through the arteries is too strong. The arteries are the blood vessels that carry blood from the heart throughout the body. Hypertension forces the heart to work harder to pump blood and may cause arteries to become narrow or stiff. Untreated or uncontrolled hypertension can lead to a heart attack, heart failure, a stroke, kidney disease, and other problems. A blood pressure reading consists of a higher number over a lower number. Ideally, your blood pressure should be below 120/80. The first ("top") number is called the systolic pressure. It is a measure of the pressure in your arteries as your heart beats. The second ("bottom") number is called the diastolic pressure. It is a measure of the pressure in your arteries as the heart relaxes. What are the causes? The exact cause of this condition is not known. There are some conditions that result in high blood pressure. What increases the risk? Certain factors may make you more likely to develop high blood pressure. Some of these risk factors are under your control, including: Smoking. Not getting enough exercise or physical activity. Being overweight. Having too much fat, sugar, calories, or salt (sodium) in your diet. Drinking too much alcohol. Other risk factors include: Having a personal history of heart disease, diabetes, high cholesterol, or kidney disease. Stress. Having a family history of high blood pressure and high cholesterol. Having obstructive sleep apnea. Age. The risk increases with age. What are the signs or symptoms? High blood pressure may not cause symptoms. Very high blood pressure (hypertensive crisis) may cause: Headache. Fast or irregular heartbeats (palpitations). Shortness of breath. Nosebleed. Nausea and vomiting. Vision changes. Severe chest pain, dizziness, and seizures. How is this diagnosed? This condition is diagnosed by  measuring your blood pressure while you are seated, with your arm resting on a flat surface, your legs uncrossed, and your feet flat on the floor. The cuff of the blood pressure monitor will be placed directly against the skin of your upper arm at the level of your heart. Blood pressure should be measured at least twice using the same arm. Certain conditions can cause a difference in blood pressure between your right and left arms. If you have a high blood pressure reading during one visit or you have normal blood pressure with other risk factors, you may be asked to: Return on a different day to have your blood pressure checked again. Monitor your blood pressure at home for 1 week or longer. If you are diagnosed with hypertension, you may have other blood or imaging tests to help your health care provider understand your overall risk for other conditions. How is this treated? This condition is treated by making healthy lifestyle changes, such as eating healthy foods, exercising more, and reducing your alcohol intake. You may be referred for counseling on a healthy diet and physical activity. Your health care provider may prescribe medicine if lifestyle changes are not enough to get your blood pressure under control and if: Your systolic blood pressure is above 130. Your diastolic blood pressure is above 80. Your personal target blood pressure may vary depending on your medical conditions, your age, and other factors. Follow these instructions at home: Eating and drinking  Eat a diet that is high in fiber and potassium, and low in sodium, added sugar, and fat. An example of this eating plan is called the DASH diet. DASH stands for Dietary Approaches to Stop Hypertension. To eat this way: Eat   plenty of fresh fruits and vegetables. Try to fill one half of your plate at each meal with fruits and vegetables. Eat whole grains, such as whole-wheat pasta, brown rice, or whole-grain bread. Fill about one  fourth of your plate with whole grains. Eat or drink low-fat dairy products, such as skim milk or low-fat yogurt. Avoid fatty cuts of meat, processed or cured meats, and poultry with skin. Fill about one fourth of your plate with lean proteins, such as fish, chicken without skin, beans, eggs, or tofu. Avoid pre-made and processed foods. These tend to be higher in sodium, added sugar, and fat. Reduce your daily sodium intake. Many people with hypertension should eat less than 1,500 mg of sodium a day. Do not drink alcohol if: Your health care provider tells you not to drink. You are pregnant, may be pregnant, or are planning to become pregnant. If you drink alcohol: Limit how much you have to: 0-1 drink a day for women. 0-2 drinks a day for men. Know how much alcohol is in your drink. In the [place] one drink equals one 12 oz bottle of beer (355 mL), one 5 oz glass of wine (148 mL), or one 1 oz glass of hard liquor (44 mL). Lifestyle  Work with your health care provider to maintain a healthy body weight or to lose weight. Ask what an ideal weight is for you. Get at least 30 minutes of exercise that causes your heart to beat faster (aerobic exercise) most days of the week. Activities may include walking, swimming, or biking. Include exercise to strengthen your muscles (resistance exercise), such as Pilates or lifting weights, as part of your weekly exercise routine. Try to do these types of exercises for 30 minutes at least 3 days a week. Do not use any products that contain nicotine or tobacco. These products include cigarettes, chewing tobacco, and vaping devices, such as e-cigarettes. If you need help quitting, ask your health care provider. Monitor your blood pressure at home as told by your health care provider. Keep all follow-up visits. This is important. Medicines Take over-the-counter and prescription medicines only as told by your health care provider. Follow directions carefully. Blood  pressure medicines must be taken as prescribed. Do not skip doses of blood pressure medicine. Doing this puts you at risk for problems and can make the medicine less effective. Ask your health care provider about side effects or reactions to medicines that you should watch for. Contact a health care provider if you: Think you are having a reaction to a medicine you are taking. Have headaches that keep coming back (recurring). Feel dizzy. Have swelling in your ankles. Have trouble with your vision. Get help right away if you: Develop a severe headache or confusion. Have unusual weakness or numbness. Feel faint. Have severe pain in your chest or abdomen. Vomit repeatedly. Have trouble breathing. These symptoms may be an emergency. Get help right away. Call 911. Do not wait to see if the symptoms will go away. Do not drive yourself to the hospital. Summary Hypertension is when the force of blood pumping through your arteries is too strong. If this condition is not controlled, it may put you at risk for serious complications. Your personal target blood pressure may vary depending on your medical conditions, your age, and other factors. For most people, a normal blood pressure is less than 120/80. Hypertension is treated with lifestyle changes, medicines, or a combination of both. Lifestyle changes include losing weight, eating a healthy,   low-sodium diet, exercising more, and limiting alcohol. This information is not intended to replace advice given to you by your health care provider. Make sure you discuss any questions you have with your health care provider. Document Revised: 03/07/2021 Document Reviewed: 03/07/2021 Elsevier Patient Education  2024 Elsevier Inc.  

## 2023-04-22 NOTE — Telephone Encounter (Signed)
Patient is aware Dr. Yetta Barre sent her in a new RX

## 2023-04-24 ENCOUNTER — Encounter: Payer: Self-pay | Admitting: Internal Medicine

## 2023-05-05 IMAGING — MG MM DIGITAL SCREENING BILAT W/ TOMO AND CAD
8 of 14 series · 8 of 40 positions shown · non-contrast
Comparison: Previous exam(s).

CLINICAL DATA: Screening.

EXAM:
DIGITAL SCREENING BILATERAL MAMMOGRAM WITH TOMOSYNTHESIS AND CAD
TECHNIQUE: Bilateral screening digital craniocaudal and mediolateral oblique
mammograms were obtained. Bilateral screening digital breast
tomosynthesis was performed. The images were evaluated with
computer-aided detection.

[R CC synth-2D]
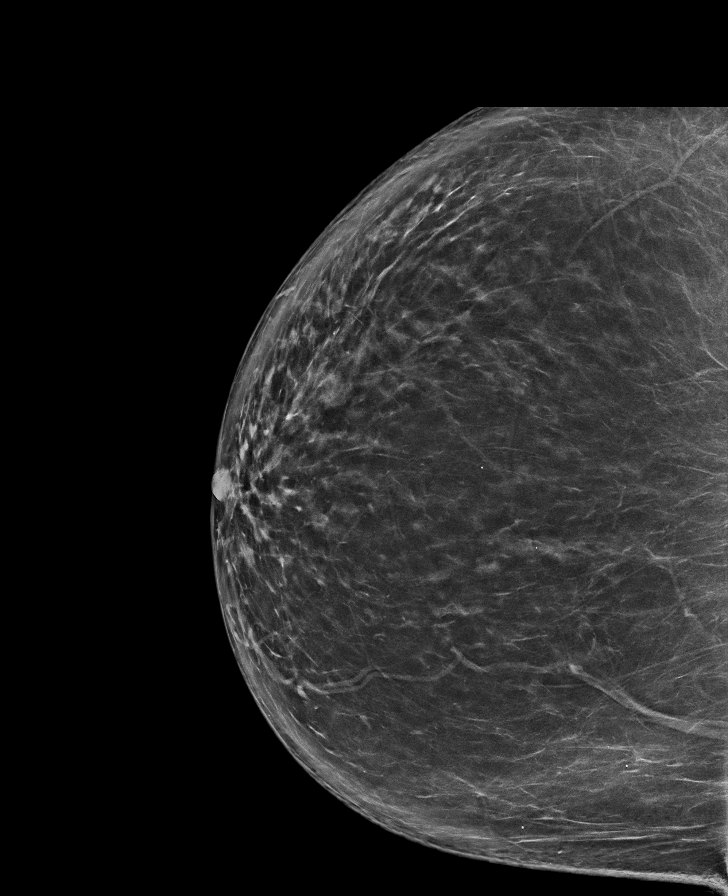

[R MLO synth-2D (1 of 2)]
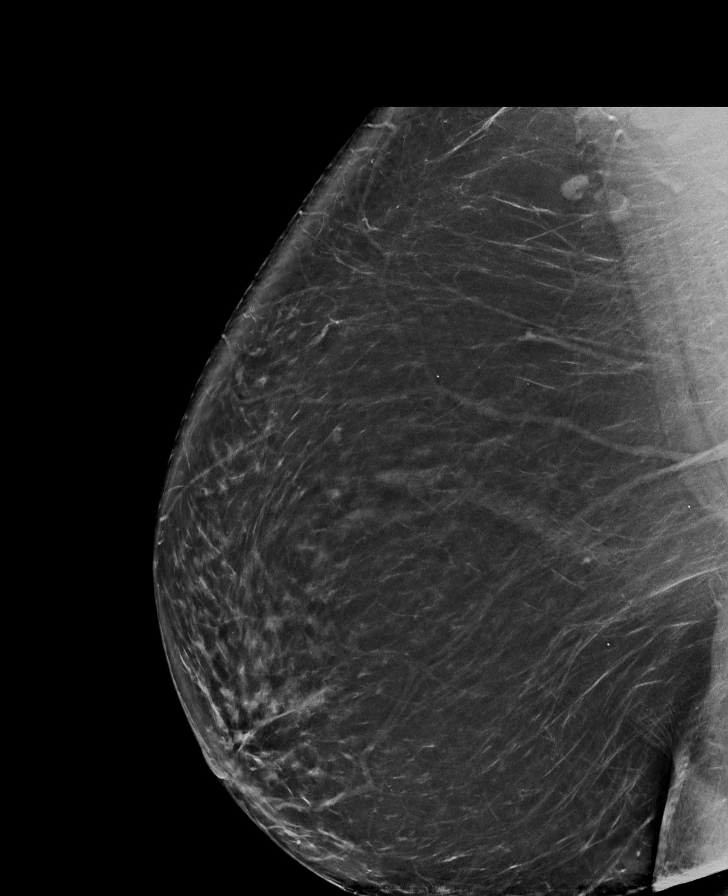

[L MLO synth-2D (1 of 2)]
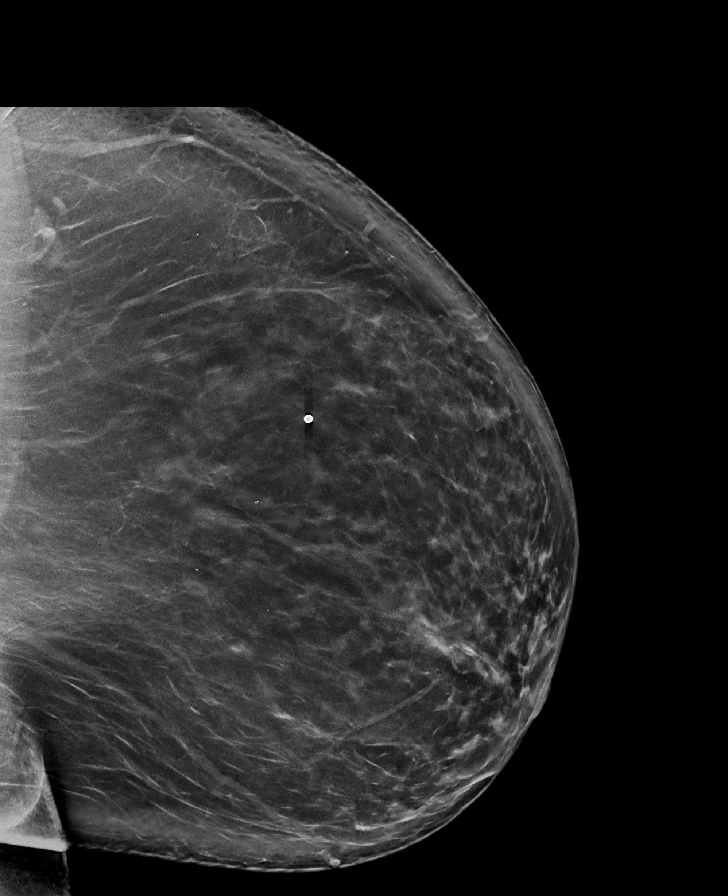

[L CC synth-2D]
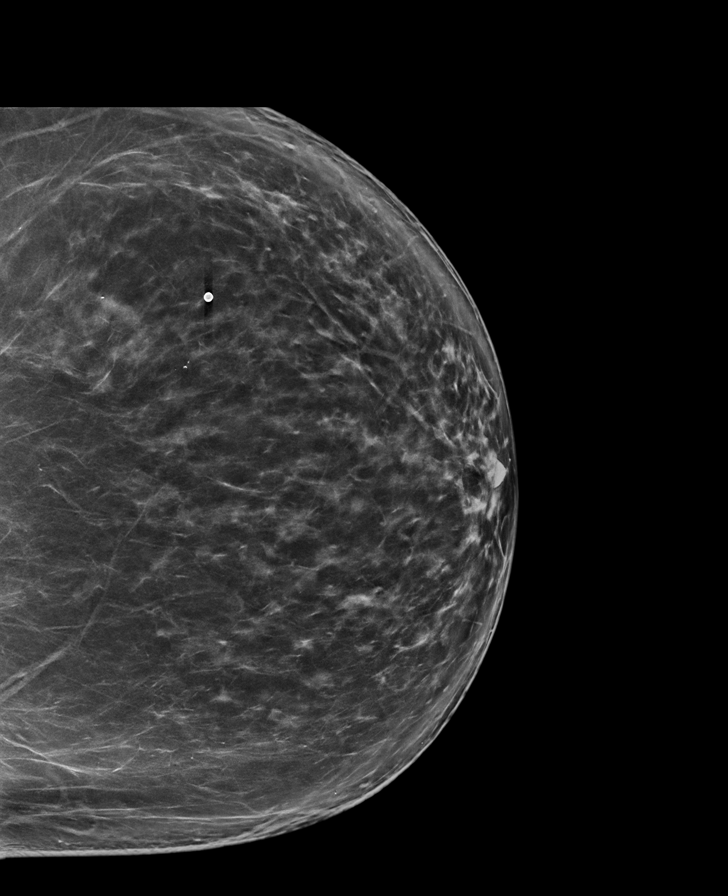

[R MLO synth-2D (2 of 2)]
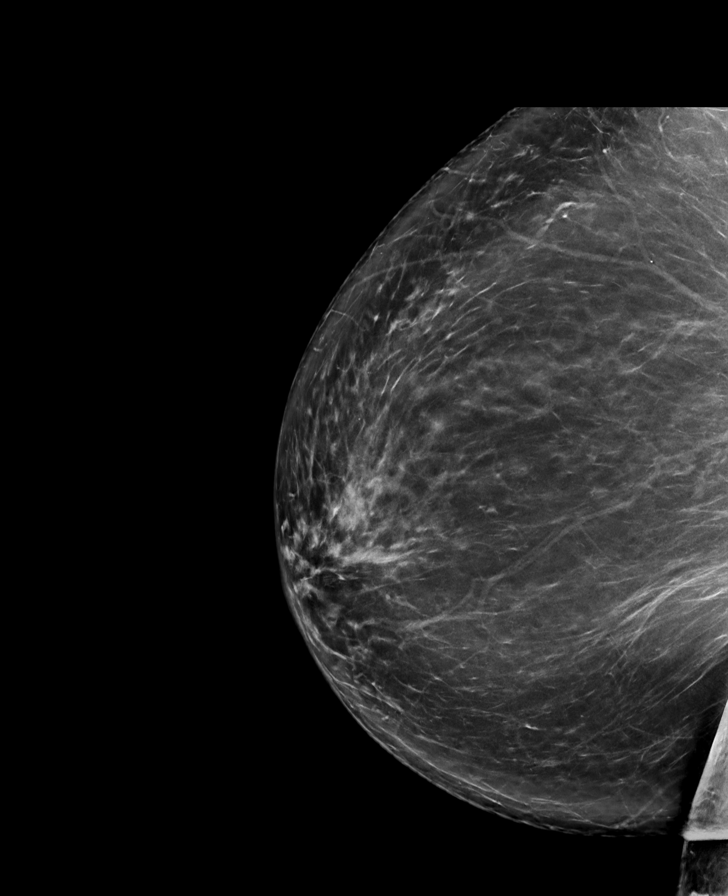

[L MLO synth-2D (2 of 2)]
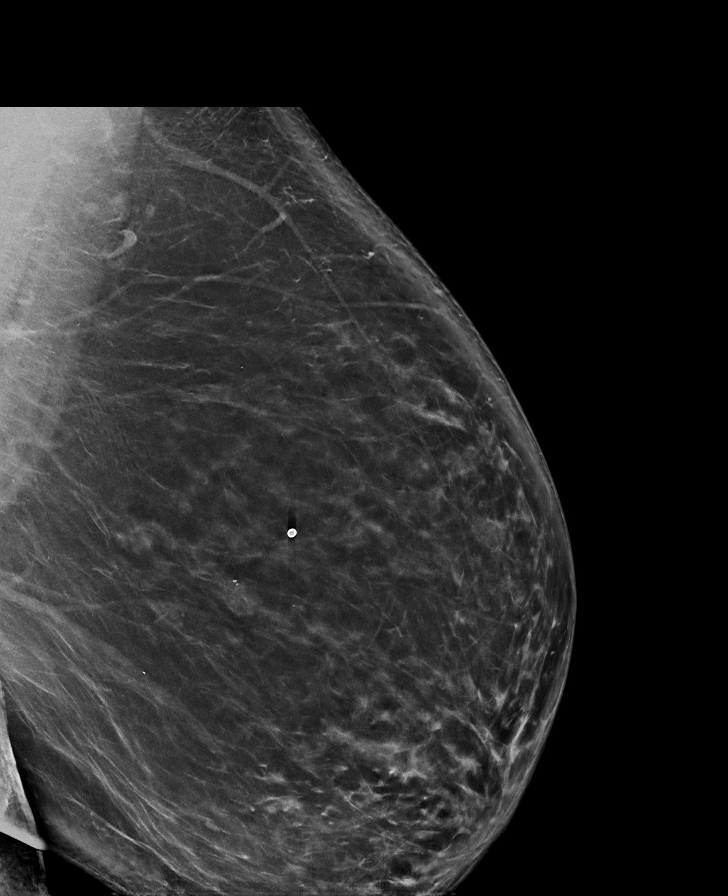

[R CV synth-2D]
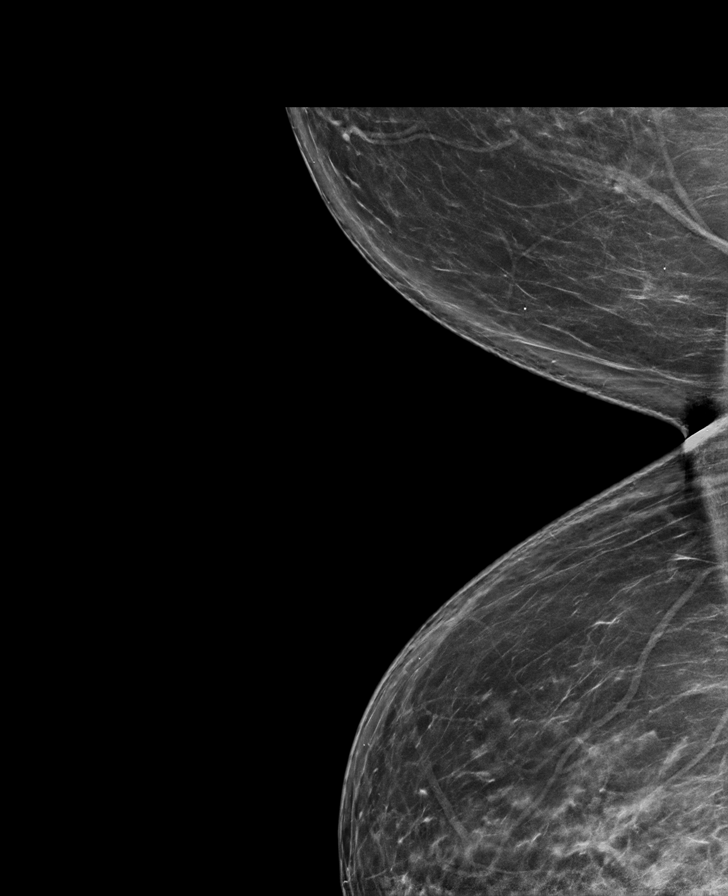

[R CC tomo · tomo slice 45/88.0]
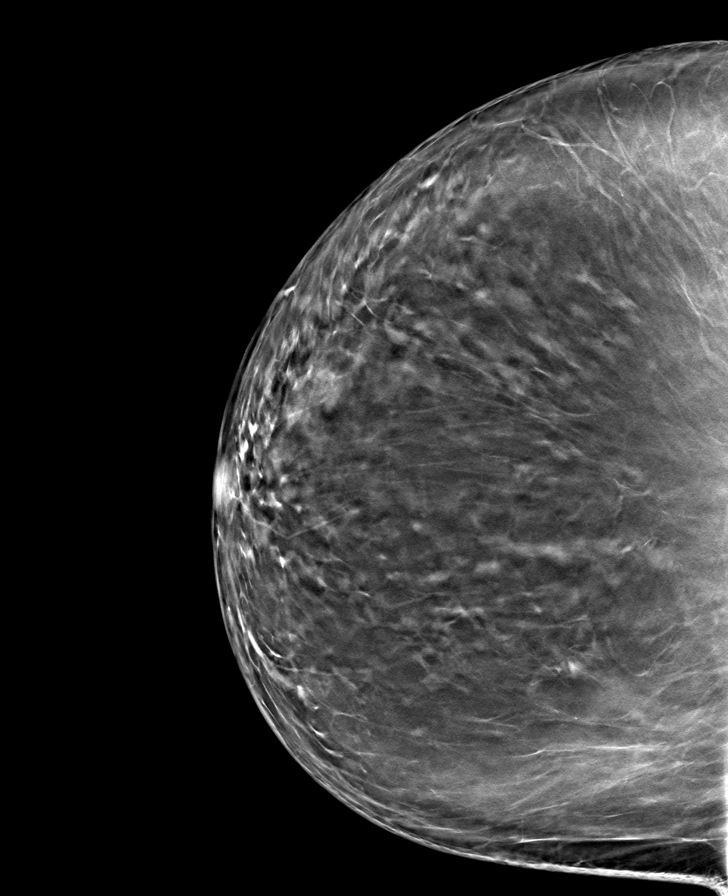

[8 of 40 positions shown; findings below may reference images not displayed]

ACR Breast Density Category b: There are scattered areas of
fibroglandular density.
FINDINGS: There are no findings suspicious for malignancy.
IMPRESSION: No mammographic evidence of malignancy. A result letter of this
screening mammogram will be mailed directly to the patient.

RECOMMENDATION:
Screening mammogram in one year. (Code:51-O-LD2)

BI-RADS CATEGORY  1: Negative.

## 2023-05-06 ENCOUNTER — Encounter (HOSPITAL_COMMUNITY)
Admission: RE | Admit: 2023-05-06 | Discharge: 2023-05-06 | Disposition: A | Discharge status: Home / Self Care | Payer: Medicare Other | Source: Ambulatory Visit | Attending: Hematology and Oncology | Admitting: Hematology and Oncology

## 2023-05-06 DIAGNOSIS — C8580 Other specified types of non-Hodgkin lymphoma, unspecified site: Secondary | ICD-10-CM | POA: Diagnosis not present

## 2023-05-06 DIAGNOSIS — R935 Abnormal findings on diagnostic imaging of other abdominal regions, including retroperitoneum: Secondary | ICD-10-CM | POA: Diagnosis not present

## 2023-05-06 DIAGNOSIS — C884 Extranodal marginal zone b-cell lymphoma of mucosa-associated lymphoid tissue (malt-lymphoma) not having achieved remission: Secondary | ICD-10-CM | POA: Diagnosis not present

## 2023-05-06 LAB — GLUCOSE, CAPILLARY: Glucose-Capillary: 95 mg/dL (ref 70–99)

## 2023-05-06 MED ORDER — FLUDEOXYGLUCOSE F - 18 (FDG) INJECTION
9.6600 | Freq: Once | INTRAVENOUS | Status: AC
  Administered 2023-05-06: 9.66 via INTRAVENOUS

## 2023-05-16 ENCOUNTER — Inpatient Hospital Stay: Payer: Medicare Other | Attending: Hematology and Oncology | Admitting: Hematology and Oncology

## 2023-05-16 VITALS — BP 139/58 | HR 85 | Temp 97.6°F | Resp 18 | Wt 199.6 lb

## 2023-05-16 DIAGNOSIS — C8309 Small cell B-cell lymphoma, extranodal and solid organ sites: Secondary | ICD-10-CM | POA: Insufficient documentation

## 2023-05-16 DIAGNOSIS — C8580 Other specified types of non-Hodgkin lymphoma, unspecified site: Secondary | ICD-10-CM | POA: Diagnosis not present

## 2023-05-16 NOTE — Progress Notes (Signed)
 [facility] FOLLOW UP NOTE  Patient Care Team: [name], MD as PCP - General (Internal Medicine) Harrietta Kurtz, OD as Consulting Physician (Optometry) Szabat, Toribio BROCKS, [facility] (Inactive) as Pharmacist (Pharmacist)  CHIEF COMPLAINTS/PURPOSE OF CONSULTATION:  Marginal zone lymphoma  ASSESSMENT & PLAN:   This is a very 68 yr old female pt with new diagnosis of small bowel marginal zone lymphoma here for hospital follow up.  Marginal Zone Lymphoma She originally presented to the emergency room in September with pain in the left upper quadrant and was found to have small bowel obstruction. She had CT abdomen pelvis which showed small bowel obstruction with transition point in the central small bowel mesentery suspicious for adhesion. She had ex lap with small bowel resection and this showed a large small bowel mass which was not obstructing the small bowel lumen. 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 Since her small bowel surgery, she remains asymptomatic, wound has nearly healed. Most recent PET/CT with no signs of tracer avid lymphadenopathy, postop changes noted from the previous small bowel resection, increased uptake along the resection site is nonspecific could be physiological or postsurgical, intense uptake is identified localizing to the descending duodenum where there is circumferential wall thickening identified cannot exclude.  Consider further evaluation with endoscopy. There are also 2 tracer avid soft tissue nodules within the medial aspect of the left breast questionable neoplasm versus lymphoma, we will do a mammogram for this. 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  I will call her and discussed the PET/CT results, send her to  gastroenterology for further evaluation of the circumferential wall thickening in the duodenum, order mammogram as well as an ultrasound of the left breast to review the importance of the soft tissue nodules. We have previously discussed about considering single agent rituximab if needed for the treatment of marginal zone lymphoma. I called the patient and discussed recommendations.  HISTORY OF PRESENTING ILLNESS:  Kayla Lowe  68 y.o. female is here because of marginal zone lymphoma of small bowel.  Oncology History   No history exists.   Discussed the use of AI scribe software for clinical note transcription with the patient, who gave verbal consent to proceed.  History of Present Illness    The patient, with a history of marginal lymphoma, presents for follow-up to review her PET/CT results. She reports no new symptoms or changes in her condition since her last visit.  Abdominal wound has pretty much healed, she still feels a little burning or change from time to time.  She otherwise denies any systemic symptoms such as fevers, drenching night sweats, loss of appetite or loss of weight.    REVIEW OF SYSTEMS:   Constitutional: Denies fevers, chills or abnormal night sweats Eyes: Denies blurriness of vision, double vision or watery eyes Ears, nose, mouth, throat, and face: Denies mucositis or sore throat Respiratory: Denies cough, dyspnea or wheezes Cardiovascular: Denies palpitation, chest discomfort or lower extremity swelling Gastrointestinal:  Denies nausea, heartburn or change in bowel habits Skin: Denies abnormal skin rashes Lymphatics: Denies new lymphadenopathy or easy bruising Neurological:Denies numbness, tingling or new weaknesses Behavioral/Psych: Mood is stable, no new changes  All other systems were reviewed with the patient and are negative.   MEDICAL HISTORY:  Past Medical History:  Diagnosis Date  GERD (gastroesophageal reflux disease)     Hyperlipidemia    Hypertension    Osteoarthritis     SURGICAL HISTORY: Past Surgical History:  Procedure Laterality Date   ABDOMINAL HYSTERECTOMY  1987   LAPAROTOMY N/A 02/04/2023   Procedure: EXPLORATORY LAPAROTOMY, WITH SMALL BOWEL RESECTION;  Surgeon: Vernetta Berg, MD;  Location: [facility];  Service: General;  Laterality: N/A;    SOCIAL HISTORY: Social History   Socioeconomic History   Marital status: Legally Separated    Spouse name: Not on file   Number of children: Not on file   Years of education: Not on file   Highest education level: Not on file  Occupational History   Not on file  Tobacco Use   Smoking status: Never   Smokeless tobacco: Never  Substance and Sexual Activity   Alcohol use: No   Drug use: No   Sexual activity: Not Currently  Other Topics Concern   Not on file  Social History Narrative   Not on file   Social Drivers of Health   Financial Resource Strain: Low Risk  (03/22/2022)   Overall Financial Resource Strain (CARDIA)    Difficulty of Paying Living Expenses: Not hard at all  Food Insecurity: No Food Insecurity (02/03/2023)   Hunger Vital Sign    Worried About Running Out of Food in the Last Year: Never true    Ran Out of Food in the Last Year: Never true  Transportation Needs: No Transportation Needs (02/03/2023)   PRAPARE - Administrator, Civil Service (Medical): No    Lack of Transportation (Non-Medical): No  Physical Activity: Inactive (03/22/2022)   Exercise Vital Sign    Days of Exercise per Week: 0 days    Minutes of Exercise per Session: 0 min  Stress: No Stress Concern Present (03/22/2022)   [employer] of Occupational Health - Occupational Stress Questionnaire    Feeling of Stress : Not at all  Social Connections: Moderately Integrated (03/22/2022)   Social Connection and Isolation Panel [NHANES]    Frequency of Communication with Friends and Family: More than three times a week    Frequency of Social  Gatherings with Friends and Family: More than three times a week    Attends Religious Services: More than 4 times per year    Active Member of [employer] or Organizations: No    Attends Engineer, Structural: More than 4 times per year    Marital Status: Separated  Intimate Partner Violence: Not At Risk (02/03/2023)   Humiliation, Afraid, Rape, and Kick questionnaire    Fear of Current or Ex-Partner: No    Emotionally Abused: No    Physically Abused: No    Sexually Abused: No    FAMILY HISTORY: Family History  Problem Relation Age of Onset   Cancer Father    Hypertension Brother    Diabetes Brother    Diabetes Paternal Grandfather    Diabetes Paternal Uncle    Colon cancer Neg Hx    Hypercalcemia Neg Hx     ALLERGIES:  is allergic to cozaar  [losartan  potassium], lisinopril , and statins.  MEDICATIONS:  Current Outpatient Medications  Medication Sig Dispense Refill   acetaminophen  (TYLENOL ) 500 MG tablet Take 2 tablets (1,000 mg total) by mouth 4 (four) times daily. (Patient taking differently: Take 1,000 mg by mouth as needed for mild pain (pain score 1-3), moderate pain (pain score 4-6), fever or headache.) 30 tablet 0   aspirin  EC 81 MG tablet Take 81  mg by mouth daily. Swallow whole.     carvedilol  (COREG ) 6.25 MG tablet Take 1 tablet (6.25 mg total) by mouth 2 (two) times daily with a meal. 180 tablet 1   cetirizine  (ZYRTEC ) 10 MG tablet Take 10 mg by mouth daily.     Cholecalciferol  (VITAMIN D ) 50 MCG (2000 UT) CAPS Take 1 capsule by mouth daily.     ezetimibe  (ZETIA ) 10 MG tablet TAKE 1 TABLET(10 MG) BY MOUTH DAILY 90 tablet 0   famotidine  (PEPCID ) 40 MG tablet TAKE 1 TABLET(40 MG) BY MOUTH AT BEDTIME 90 tablet 1   Ferric Maltol  (ACCRUFER ) 30 MG CAPS Take 1 capsule (30 mg total) by mouth in the morning and at bedtime. 180 capsule 0   fluticasone  (FLONASE ) 50 MCG/ACT nasal spray Place 2 sprays into both nostrils daily. 48 g 1   hydrALAZINE  (APRESOLINE ) 25 MG tablet TAKE 1  TABLET(25 MG) BY MOUTH THREE TIMES DAILY 270 tablet 1   montelukast  (SINGULAIR ) 10 MG tablet Take 1 tablet (10 mg total) by mouth at bedtime. 90 tablet 1   pantoprazole  (PROTONIX ) 20 MG tablet TAKE 1 TABLET(20 MG) BY MOUTH DAILY 90 tablet 1   pravastatin  (PRAVACHOL ) 40 MG tablet Take 1 tablet (40 mg total) by mouth daily. 90 tablet 1   tiZANidine  (ZANAFLEX ) 2 MG tablet Take 1 tablet (2 mg total) by mouth every 8 (eight) hours as needed for muscle spasms. 270 tablet 0   traMADol  (ULTRAM ) 50 MG tablet Take 1-2 tablets (50-100 mg total) by mouth every 6 (six) hours as needed for moderate pain or severe pain. (Patient not taking: Reported on 04/22/2023) 30 tablet 0   triamcinolone  cream (KENALOG ) 0.5 % Apply 1 Application topically as needed.     No current facility-administered medications for this visit.      PHYSICAL EXAMINATION: ECOG PERFORMANCE STATUS: 2 - Symptomatic, <50% confined to bed  Vitals:   05/16/23 1021  BP: (!) 139/58  Pulse: 85  Resp: 18  Temp: 97.6 F (36.4 C)  SpO2: 100%   Filed Weights   05/16/23 1021  Weight: 199 lb 9.6 oz (90.5 kg)    GENERAL:alert, no distress and comfortable ABDOMEN: Wound in the abdomen healed. No evidence of infection.   LABORATORY DATA:  I have reviewed the data as listed Lab Results  Component Value Date   WBC 4.3 04/18/2023   HGB 12.4 04/18/2023   HCT 38.6 04/18/2023   MCV 94.4 04/18/2023   PLT 339 04/18/2023     Chemistry      Component Value Date/Time   NA 142 04/18/2023 1039   NA 145 (H) 07/29/2018 1112   K 3.8 04/18/2023 1039   CL 109 04/18/2023 1039   CO2 27 04/18/2023 1039   BUN 10 04/18/2023 1039   BUN 8 07/29/2018 1112   CREATININE 0.85 04/18/2023 1039   CREATININE 0.87 08/03/2015 0942      Component Value Date/Time   CALCIUM  10.7 (H) 04/18/2023 1039   ALKPHOS 100 04/18/2023 1039   AST 11 (L) 04/18/2023 1039   ALT 7 04/18/2023 1039   BILITOT 0.3 04/18/2023 1039       RADIOGRAPHIC STUDIES: I have  personally reviewed the radiological images as listed and agreed with the findings in the report. No results found.  All questions were answered. The patient knows to call the clinic with any problems, questions or concerns.     Amber Stalls, MD 05/16/2023 10:39 AM

## 2023-05-18 ENCOUNTER — Other Ambulatory Visit: Payer: Self-pay | Admitting: Internal Medicine

## 2023-05-18 DIAGNOSIS — E785 Hyperlipidemia, unspecified: Secondary | ICD-10-CM

## 2023-05-24 ENCOUNTER — Ambulatory Visit
Admission: RE | Admit: 2023-05-24 | Discharge: 2023-05-24 | Disposition: A | Discharge status: Home / Self Care | Payer: Medicare Other | Source: Ambulatory Visit | Attending: Hematology and Oncology

## 2023-05-24 ENCOUNTER — Other Ambulatory Visit: Payer: Self-pay | Admitting: Hematology and Oncology

## 2023-05-24 DIAGNOSIS — N6322 Unspecified lump in the left breast, upper inner quadrant: Secondary | ICD-10-CM | POA: Diagnosis not present

## 2023-05-24 DIAGNOSIS — C8580 Other specified types of non-Hodgkin lymphoma, unspecified site: Secondary | ICD-10-CM

## 2023-05-24 DIAGNOSIS — N6320 Unspecified lump in the left breast, unspecified quadrant: Secondary | ICD-10-CM

## 2023-05-27 DIAGNOSIS — C8599 Non-Hodgkin lymphoma, unspecified, extranodal and solid organ sites: Secondary | ICD-10-CM | POA: Diagnosis not present

## 2023-05-28 ENCOUNTER — Ambulatory Visit
Admission: RE | Admit: 2023-05-28 | Discharge: 2023-05-28 | Disposition: A | Discharge status: Home / Self Care | Payer: Medicare Other | Source: Ambulatory Visit | Attending: Hematology and Oncology | Admitting: Hematology and Oncology

## 2023-05-28 ENCOUNTER — Ambulatory Visit
Admission: RE | Admit: 2023-05-28 | Discharge: 2023-05-28 | Discharge status: Home / Self Care | Payer: Medicare Other | Source: Ambulatory Visit | Attending: Hematology and Oncology | Admitting: Hematology and Oncology

## 2023-05-28 ENCOUNTER — Ambulatory Visit
Admission: RE | Admit: 2023-05-28 | Discharge: 2023-05-28 | Disposition: A | Discharge status: Home / Self Care | Payer: Medicare Other | Source: Ambulatory Visit | Attending: Hematology and Oncology

## 2023-05-28 ENCOUNTER — Encounter: Payer: Self-pay | Admitting: Nurse Practitioner

## 2023-05-28 DIAGNOSIS — N6320 Unspecified lump in the left breast, unspecified quadrant: Secondary | ICD-10-CM

## 2023-05-28 DIAGNOSIS — C8580 Other specified types of non-Hodgkin lymphoma, unspecified site: Secondary | ICD-10-CM

## 2023-05-28 DIAGNOSIS — Z170 Estrogen receptor positive status [ER+]: Secondary | ICD-10-CM | POA: Diagnosis not present

## 2023-05-28 DIAGNOSIS — N6322 Unspecified lump in the left breast, upper inner quadrant: Secondary | ICD-10-CM | POA: Diagnosis not present

## 2023-05-28 DIAGNOSIS — C50912 Malignant neoplasm of unspecified site of left female breast: Secondary | ICD-10-CM | POA: Diagnosis not present

## 2023-05-28 HISTORY — PX: BREAST BIOPSY: SHX20

## 2023-05-29 ENCOUNTER — Encounter: Payer: Self-pay | Admitting: Internal Medicine

## 2023-05-29 ENCOUNTER — Other Ambulatory Visit: Payer: Self-pay | Admitting: Internal Medicine

## 2023-05-29 ENCOUNTER — Telehealth: Payer: Self-pay | Admitting: Hematology and Oncology

## 2023-05-29 ENCOUNTER — Other Ambulatory Visit: Payer: Self-pay

## 2023-05-29 DIAGNOSIS — M170 Bilateral primary osteoarthritis of knee: Secondary | ICD-10-CM

## 2023-05-29 DIAGNOSIS — R252 Cramp and spasm: Secondary | ICD-10-CM

## 2023-05-29 LAB — SURGICAL PATHOLOGY

## 2023-05-29 MED ORDER — TRAMADOL HCL 50 MG PO TABS: 50.0000 mg | ORAL_TABLET | Freq: Four times a day (QID) | ORAL | 0 refills | Status: DC | PRN

## 2023-05-29 NOTE — Telephone Encounter (Signed)
 Spoke with patient confirming upcoming appointment

## 2023-05-31 ENCOUNTER — Other Ambulatory Visit: Payer: Self-pay

## 2023-05-31 ENCOUNTER — Inpatient Hospital Stay: Payer: Medicare Other | Admitting: Hematology and Oncology

## 2023-05-31 VITALS — BP 182/83 | HR 98 | Temp 97.8°F | Resp 16 | Wt 202.3 lb

## 2023-05-31 DIAGNOSIS — C8309 Small cell B-cell lymphoma, extranodal and solid organ sites: Secondary | ICD-10-CM | POA: Diagnosis not present

## 2023-05-31 DIAGNOSIS — Z170 Estrogen receptor positive status [ER+]: Secondary | ICD-10-CM

## 2023-05-31 DIAGNOSIS — C8580 Other specified types of non-Hodgkin lymphoma, unspecified site: Secondary | ICD-10-CM

## 2023-05-31 DIAGNOSIS — C50912 Malignant neoplasm of unspecified site of left female breast: Secondary | ICD-10-CM

## 2023-05-31 NOTE — Addendum Note (Signed)
Addended by: Etta Grandchild on: 05/31/2023 02:10 PM   Modules accepted: Orders

## 2023-05-31 NOTE — Progress Notes (Signed)
[facility] FOLLOW UP NOTE  Patient Care Team: Etta Grandchild, MD as PCP - General (Internal Medicine) Norva Pavlov, OD as Consulting Physician (Optometry) Szabat, Vinnie Level, [facility] (Inactive) as Pharmacist (Pharmacist)  CHIEF COMPLAINTS/PURPOSE OF CONSULTATION:  Marginal zone lymphoma  ASSESSMENT & PLAN:   This is a very 68 yr old female pt with new diagnosis of small bowel marginal zone lymphoma here for hospital follow up.  Marginal Zone Lymphoma She originally presented to the emergency room in September with pain in the left upper quadrant and was found to have small bowel obstruction. She had CT abdomen pelvis which showed small bowel obstruction with transition point in the central small bowel mesentery suspicious for adhesion. She had ex lap with small bowel resection and this showed a large small bowel mass which was not obstructing the small bowel lumen. 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 Since her small bowel surgery, she remains asymptomatic, wound has nearly healed. Most recent PET/CT with no signs of tracer avid lymphadenopathy, postop changes noted from the previous small bowel resection, increased uptake along the resection site is nonspecific could be physiological or postsurgical, intense uptake is identified localizing to the descending duodenum where there is circumferential wall thickening identified cannot exclude.  Consider further evaluation with endoscopy. There are also 2 tracer avid soft tissue nodules within the medial aspect of the left breast questionable neoplasm versus lymphoma, we will do a mammogram for this. PET CT showed several findings, duodenal thickening, left breast nodules, uptake in the vagina.  Breast Cancer Two small areas in the left breast at 10  o'clock position, one measuring 1 cm and the other 7 mm. Both biopsied and confirmed as breast cancer. Estrogen and progesterone positive, HER2 status pending. Discussed the need for surgical removal and potential subsequent treatments including chemotherapy and anti-estrogen therapy. -Refer to Dr. Magnus Ivan for surgical consultation on 06/07/2023. -Consider Oncotype testing post-surgery if her 2 neg to guide decision on chemotherapy. -She understands that there may be a role for adjuvant chemotherapy if the HER2 is indeed amplified. -Consider anti-estrogen therapy post-surgery (tamoxifen, anastrozole, letrozole).  Gynecological Concern Activity noted in the vagina on PET scan, likely unrelated to lymphoma or breast cancer. No suspicion of malignancy at this time. -Refer for gynecological exam. This is pending.  Gastrointestinal Concern Thickening in the duodenum noted on PET scan. -Refer to gastroenterologist for further evaluation on 06/13/2023.  Genetic Testing Family history of breast cancer in an aunt. Need to determine eligibility for genetic testing. -Determine eligibility for genetic testing and consider drawing blood at next appointment if eligible.   FU as recommended.  HISTORY OF PRESENTING ILLNESS:  Kayla Lowe 68 y.o. female is here because of marginal zone lymphoma of small bowel.  Oncology History   No history exists.   Discussed the use of AI scribe software for clinical note transcription with the patient, who gave verbal consent to proceed.  History of Present Illness    The patient, with a history of marginal lymphoma, presents for follow-up since her mammogram and ultrasound showed 2 masses in the left breast.  She had these biopsied and these indeed demonstrated breast cancer.  She is here with her sister.  She is understandably worried about another cancer.  She remembers a paternal aunt having breast cancer, otherwise no significant family history of  breast cancer.  She is going to see the gastroenterologist, has not heard from the gynecologist yet.   MEDICAL HISTORY:  Past Medical History:  Diagnosis Date   GERD (gastroesophageal reflux disease)    Hyperlipidemia    Hypertension    Osteoarthritis     SURGICAL HISTORY: Past Surgical History:  Procedure Laterality Date   ABDOMINAL HYSTERECTOMY  1987   BREAST BIOPSY Left 05/28/2023   [place] LT BREAST BX W LOC DEV EA ADD LESION IMG BX SPEC US GUIDE 05/28/2023 [facility]   BREAST BIOPSY Left 05/28/2023   [place] LT BREAST BX W LOC DEV 1ST LESION IMG BX SPEC US GUIDE 05/28/2023 [facility]   LAPAROTOMY N/A 02/04/2023   Procedure: EXPLORATORY LAPAROTOMY, WITH SMALL BOWEL RESECTION;  Surgeon: Abigail Miyamoto, MD;  Location: [facility];  Service: General;  Laterality: N/A;    SOCIAL HISTORY: Social History   Socioeconomic History   Marital status: Legally Separated    Spouse name: Not on file   Number of children: Not on file   Years of education: Not on file   Highest education level: Not on file  Occupational History   Not on file  Tobacco Use   Smoking status: Never   Smokeless tobacco: Never  Substance and Sexual Activity   Alcohol use: No   Drug use: No   Sexual activity: Not Currently  Other Topics Concern   Not on file  Social History Narrative   Not on file   Social Drivers of Health   Financial Resource Strain: Low Risk  (03/22/2022)   Overall Financial Resource Strain (CARDIA)    Difficulty of Paying Living Expenses: Not hard at all  Food Insecurity: No Food Insecurity (02/03/2023)   Hunger Vital Sign    Worried About Running Out of Food in the Last Year: Never true    Ran Out of Food in the Last Year: Never true  Transportation Needs: No Transportation Needs (02/03/2023)   PRAPARE - Administrator, Civil Service (Medical): No    Lack of Transportation (Non-Medical): No  Physical Activity: Inactive (03/22/2022)   Exercise Vital Sign    Days of  Exercise per Week: 0 days    Minutes of Exercise per Session: 0 min  Stress: No Stress Concern Present (03/22/2022)   [employer] of Occupational Health - Occupational Stress Questionnaire    Feeling of Stress : Not at all  Social Connections: Moderately Integrated (03/22/2022)   Social Connection and Isolation Panel [NHANES]    Frequency of Communication with Friends and Family: More than three times a week    Frequency of Social Gatherings with Friends and Family: More than three times a week    Attends Religious Services: More than 4 times per year    Active Member of [employer] or Organizations: No    Attends Engineer, structural: More than 4 times per year    Marital Status: Separated  Intimate Partner Violence: Not At Risk (02/03/2023)   Humiliation, Afraid, Rape, and Kick questionnaire    Fear of Current or Ex-Partner: No    Emotionally Abused: No    Physically Abused: No    Sexually Abused: No    FAMILY HISTORY: Family History  Problem Relation Age of Onset   Cancer Father    Hypertension Brother    Diabetes Brother    Diabetes Paternal Grandfather    Diabetes Paternal Uncle    Colon cancer Neg Hx    Hypercalcemia Neg Hx  ALLERGIES:  is allergic to cozaar [losartan potassium], lisinopril, and statins.  MEDICATIONS:  Current Outpatient Medications  Medication Sig Dispense Refill   acetaminophen (TYLENOL) 500 MG tablet Take 2 tablets (1,000 mg total) by mouth 4 (four) times daily. (Patient taking differently: Take 1,000 mg by mouth as needed for mild pain (pain score 1-3), moderate pain (pain score 4-6), fever or headache.) 30 tablet 0   aspirin EC 81 MG tablet Take 81 mg by mouth daily. Swallow whole.     carvedilol (COREG) 6.25 MG tablet Take 1 tablet (6.25 mg total) by mouth 2 (two) times daily with a meal. 180 tablet 1   cetirizine (ZYRTEC) 10 MG tablet Take 10 mg by mouth daily.     Cholecalciferol (VITAMIN D) 50 MCG (2000 UT) CAPS Take 1 capsule by  mouth daily.     ezetimibe (ZETIA) 10 MG tablet TAKE 1 TABLET(10 MG) BY MOUTH DAILY 90 tablet 0   famotidine (PEPCID) 40 MG tablet TAKE 1 TABLET(40 MG) BY MOUTH AT BEDTIME 90 tablet 1   Ferric Maltol (ACCRUFER) 30 MG CAPS Take 1 capsule (30 mg total) by mouth in the morning and at bedtime. 180 capsule 0   fluticasone (FLONASE) 50 MCG/ACT nasal spray Place 2 sprays into both nostrils daily. 48 g 1   hydrALAZINE (APRESOLINE) 25 MG tablet TAKE 1 TABLET(25 MG) BY MOUTH THREE TIMES DAILY 270 tablet 1   montelukast (SINGULAIR) 10 MG tablet Take 1 tablet (10 mg total) by mouth at bedtime. 90 tablet 1   pantoprazole (PROTONIX) 20 MG tablet TAKE 1 TABLET(20 MG) BY MOUTH DAILY 90 tablet 1   pravastatin (PRAVACHOL) 40 MG tablet TAKE 1 TABLET(40 MG) BY MOUTH DAILY 90 tablet 1   tiZANidine (ZANAFLEX) 2 MG tablet Take 1 tablet (2 mg total) by mouth every 8 (eight) hours as needed for muscle spasms. 270 tablet 0   traMADol (ULTRAM) 50 MG tablet Take 1 tablet (50 mg total) by mouth every 6 (six) hours as needed. 270 tablet 0   triamcinolone cream (KENALOG) 0.5 % Apply 1 Application topically as needed.     No current facility-administered medications for this visit.      PHYSICAL EXAMINATION: ECOG PERFORMANCE STATUS: 2 - Symptomatic, <50% confined to bed  Vitals:   05/31/23 1323  BP: (!) 182/83  Pulse: 98  Resp: 16  Temp: 97.8 F (36.6 C)  SpO2: 99%   Filed Weights   05/31/23 1323  Weight: 202 lb 4.8 oz (91.8 kg)    GENERAL:alert, no distress and comfortable    LABORATORY DATA:  I have reviewed the data as listed Lab Results  Component Value Date   WBC 4.3 04/18/2023   HGB 12.4 04/18/2023   HCT 38.6 04/18/2023   MCV 94.4 04/18/2023   PLT 339 04/18/2023     Chemistry      Component Value Date/Time   NA 142 04/18/2023 1039   NA 145 (H) 07/29/2018 1112   K 3.8 04/18/2023 1039   CL 109 04/18/2023 1039   CO2 27 04/18/2023 1039   BUN 10 04/18/2023 1039   BUN 8 07/29/2018 1112    CREATININE 0.85 04/18/2023 1039   CREATININE 0.87 08/03/2015 0942      Component Value Date/Time   CALCIUM 10.7 (H) 04/18/2023 1039   ALKPHOS 100 04/18/2023 1039   AST 11 (L) 04/18/2023 1039   ALT 7 04/18/2023 1039   BILITOT 0.3 04/18/2023 1039       RADIOGRAPHIC STUDIES: I have personally  reviewed the radiological images as listed and agreed with the findings in the report. MM DIAG BREAST TOMO UNI LEFT Addendum Date: 05/31/2023 ADDENDUM REPORT: 05/31/2023 07:52 ADDENDUM: This is an addendum to diagnostic exam dated 05/24/2023. IMPRESSION: 2 adjacent suspicious masses in the 10 o'clock region of the LEFT (diagnostic report stated right) breast 19 cm from the nipple. Recommendation: Ultrasound-guided core biopsy of the 2 adjacent masses in the 10 o'clock region of the LEFT (diagnostic report stated left) breast is recommended. Electronically Signed   By: Baird Lyons M.D.   On: 05/31/2023 07:52   Result Date: 05/31/2023 CLINICAL DATA:  68 year old female with known lymphoma. Recent PET scan demonstrated 2 soft tissue nodules in the medial aspect of the left breast. EXAM: DIGITAL DIAGNOSTIC UNILATERAL LEFT MAMMOGRAM WITH TOMOSYNTHESIS AND CAD; ULTRASOUND LEFT BREAST LIMITED TECHNIQUE: Left digital diagnostic mammography and breast tomosynthesis was performed. The images were evaluated with computer-aided detection. ; Targeted ultrasound examination of the left breast was performed. COMPARISON:  Previous exam(s) including NM PET scan dated 05/06/2023. ACR Breast Density Category b: There are scattered areas of fibroglandular density. FINDINGS: No suspicious mass, malignant type microcalcifications or distortion detected in the left breast. Targeted ultrasound is performed, showing 2 adjacent irregular hypoechoic masses with angular margins in the left breast at 10 o'clock 19 cm from the nipple measuring 1 x 0.5 x 0.7 cm and 6 x 6 x 7 mm. IMPRESSION: Two adjacent suspicious masses in the 10 o'clock  region of the right breast 19 cm from the nipple. RECOMMENDATION: Ultrasound-guided core biopsy of the 2 adjacent masses in the 10 o'clock region of the right breast 19 cm from the nipple is recommended. The masses are adjacent to each other can be biopsied together. I have discussed the findings and recommendations with the patient. If applicable, a reminder letter will be sent to the patient regarding the next appointment. BI-RADS CATEGORY  4: Suspicious. Electronically Signed: By: Baird Lyons M.D. On: 05/24/2023 14:41   [place] LIMITED ULTRASOUND INCLUDING AXILLA LEFT BREAST  Addendum Date: 05/31/2023 ADDENDUM REPORT: 05/31/2023 07:52 ADDENDUM: This is an addendum to diagnostic exam dated 05/24/2023. IMPRESSION: 2 adjacent suspicious masses in the 10 o'clock region of the LEFT (diagnostic report stated right) breast 19 cm from the nipple. Recommendation: Ultrasound-guided core biopsy of the 2 adjacent masses in the 10 o'clock region of the LEFT (diagnostic report stated left) breast is recommended. Electronically Signed   By: Baird Lyons M.D.   On: 05/31/2023 07:52   Result Date: 05/31/2023 CLINICAL DATA:  68 year old female with known lymphoma. Recent PET scan demonstrated 2 soft tissue nodules in the medial aspect of the left breast. EXAM: DIGITAL DIAGNOSTIC UNILATERAL LEFT MAMMOGRAM WITH TOMOSYNTHESIS AND CAD; ULTRASOUND LEFT BREAST LIMITED TECHNIQUE: Left digital diagnostic mammography and breast tomosynthesis was performed. The images were evaluated with computer-aided detection. ; Targeted ultrasound examination of the left breast was performed. COMPARISON:  Previous exam(s) including NM PET scan dated 05/06/2023. ACR Breast Density Category b: There are scattered areas of fibroglandular density. FINDINGS: No suspicious mass, malignant type microcalcifications or distortion detected in the left breast. Targeted ultrasound is performed, showing 2 adjacent irregular hypoechoic masses with angular margins in  the left breast at 10 o'clock 19 cm from the nipple measuring 1 x 0.5 x 0.7 cm and 6 x 6 x 7 mm. IMPRESSION: Two adjacent suspicious masses in the 10 o'clock region of the right breast 19 cm from the nipple. RECOMMENDATION: Ultrasound-guided core biopsy of the 2 adjacent  masses in the 10 o'clock region of the right breast 19 cm from the nipple is recommended. The masses are adjacent to each other can be biopsied together. I have discussed the findings and recommendations with the patient. If applicable, a reminder letter will be sent to the patient regarding the next appointment. BI-RADS CATEGORY  4: Suspicious. Electronically Signed: By: Baird Lyons M.D. On: 05/24/2023 14:41   [place] LT BREAST BX W LOC DEV 1ST LESION IMG BX SPEC US GUIDE Addendum Date: 05/29/2023 ADDENDUM REPORT: 05/29/2023 14:03 ADDENDUM: Pathology revealed: GRADE III INVASIVE MAMMARY CARCINOMA of the LEFT breast, superficial mass in the UPPER INNER QUADRANT, (ribbon clip). This was found to be concordant by Dr. Quincy Carnes. Pathology revealed: GRADE III INVASIVE MAMMARY CARCINOMA of the LEFT breast, deeper mass in the UPPER INNER QUADRANT, (coil clip). This was found to be concordant by Dr. Quincy Carnes. Pathology results were discussed with the patient by telephone. The patient reported doing well after the biopsies with tenderness at the sites. Post biopsy instructions and care were reviewed and questions were answered. The patient was encouraged to call [facility] for any additional concerns. My direct phone number was provided. Dr. Carman Ching and Dr. Rachel Moulds were notified of biopsy results via secure EPIC message on May 29, 2023. Medical Oncology consultation has been arranged with Dr. Rachel Moulds at [facility] on May 31, 2023. Surgical consultation request was sent to [facility] for Dr. Carman Ching via secure EPIC message on May 29, 2023. Note: Due to  the far medial location of these adjacent masses, localization prior to excision will need to be performed using ultrasound guidance. Pathology results reported by Rene Kocher, RN on 05/29/2023. Electronically Signed   By: Hulan Saas M.D.   On: 05/29/2023 14:03   Result Date: 05/29/2023 CLINICAL DATA:  68 year old with a recent diagnosis of marginal zone lymphoma, shown on a PET-CT 05/06/2023 to have 2 adjacent FDG avid masses in the inner LEFT breast. Ultrasound confirmed 2 adjacent masses at 10 o'clock 19 cm from the nipple, the more superficial mass measuring approximately 1.1 cm and the deeper mass measuring approximately 1.4 cm, spanning 2.5 cm in total. These masses were inconspicuous on mammography to their far medial location. EXAM: ULTRASOUND GUIDED LEFT BREAST CORE NEEDLE BIOPSY x 2 COMPARISON:  Previous exam(s). PROCEDURE: I met with the patient and we discussed the procedure of ultrasound-guided biopsy, including benefits and alternatives. We discussed the high likelihood of a successful procedure. We discussed the risks of the procedure, including infection, bleeding, tissue injury, clip migration, and inadequate sampling. Informed written consent was given. The usual time-out protocol was performed immediately prior to the procedure. #1) Superficial mass, lesion quadrant: UPPER INNER QUADRANT. Using sterile technique with chlorhexidine as skin antisepsis, 1% Lidocaine as local anesthetic, under direct ultrasound visualization, a 12 gauge Bard Marquee core needle device placed through an 11 gauge introducer needle was used to perform biopsy of the more superficial mass in the UPPER INNER QUADRANT using a lateral approach. At the conclusion of the procedure, a ribbon shaped tissue marker clip was deployed into the biopsy cavity. #2) Deep mass, lesion quadrant: UPPER INNER QUADRANT. Using sterile technique with chlorhexidine as skin antisepsis, 1% lidocaine as local anesthetic, under direct  ultrasound visualization, using the same dermatotomy site, a 12 gauge Bard Marquee core needle device placed through an 11 gauge introducer needle used biopsy of the deeper mass in the UPPER INNER  QUADRANT using a lateral approach. At the conclusion of the procedure, a coil shaped tissue marker clip was deployed into the biopsy cavity. The patient tolerated the procedures well without apparent immediate complications. Follow up 2 view mammogram was performed in order to confirm clip placement and was dictated separately. IMPRESSION: Ultrasound guided core needle biopsy (x2) of adjacent masses in the UPPER INNER QUADRANT of the LEFT breast adjacent to sternum. Electronically Signed: By: Hulan Saas M.D. On: 05/28/2023 09:05   [place] LT BREAST BX W LOC DEV EA ADD LESION IMG BX SPEC US GUIDE Addendum Date: 05/29/2023 ADDENDUM REPORT: 05/29/2023 14:03 ADDENDUM: Pathology revealed: GRADE III INVASIVE MAMMARY CARCINOMA of the LEFT breast, superficial mass in the UPPER INNER QUADRANT, (ribbon clip). This was found to be concordant by Dr. Quincy Carnes. Pathology revealed: GRADE III INVASIVE MAMMARY CARCINOMA of the LEFT breast, deeper mass in the UPPER INNER QUADRANT, (coil clip). This was found to be concordant by Dr. Quincy Carnes. Pathology results were discussed with the patient by telephone. The patient reported doing well after the biopsies with tenderness at the sites. Post biopsy instructions and care were reviewed and questions were answered. The patient was encouraged to call [facility] for any additional concerns. My direct phone number was provided. Dr. Carman Ching and Dr. Rachel Moulds were notified of biopsy results via secure EPIC message on May 29, 2023. Medical Oncology consultation has been arranged with Dr. Rachel Moulds at [facility] on May 31, 2023. Surgical consultation request was sent to [facility] for Dr. Carman Ching via  secure EPIC message on May 29, 2023. Note: Due to the far medial location of these adjacent masses, localization prior to excision will need to be performed using ultrasound guidance. Pathology results reported by Rene Kocher, RN on 05/29/2023. Electronically Signed   By: Hulan Saas M.D.   On: 05/29/2023 14:03   Result Date: 05/29/2023 CLINICAL DATA:  68 year old with a recent diagnosis of marginal zone lymphoma, shown on a PET-CT 05/06/2023 to have 2 adjacent FDG avid masses in the inner LEFT breast. Ultrasound confirmed 2 adjacent masses at 10 o'clock 19 cm from the nipple, the more superficial mass measuring approximately 1.1 cm and the deeper mass measuring approximately 1.4 cm, spanning 2.5 cm in total. These masses were inconspicuous on mammography to their far medial location. EXAM: ULTRASOUND GUIDED LEFT BREAST CORE NEEDLE BIOPSY x 2 COMPARISON:  Previous exam(s). PROCEDURE: I met with the patient and we discussed the procedure of ultrasound-guided biopsy, including benefits and alternatives. We discussed the high likelihood of a successful procedure. We discussed the risks of the procedure, including infection, bleeding, tissue injury, clip migration, and inadequate sampling. Informed written consent was given. The usual time-out protocol was performed immediately prior to the procedure. #1) Superficial mass, lesion quadrant: UPPER INNER QUADRANT. Using sterile technique with chlorhexidine as skin antisepsis, 1% Lidocaine as local anesthetic, under direct ultrasound visualization, a 12 gauge Bard Marquee core needle device placed through an 11 gauge introducer needle was used to perform biopsy of the more superficial mass in the UPPER INNER QUADRANT using a lateral approach. At the conclusion of the procedure, a ribbon shaped tissue marker clip was deployed into the biopsy cavity. #2) Deep mass, lesion quadrant: UPPER INNER QUADRANT. Using sterile technique with chlorhexidine as skin antisepsis,  1% lidocaine as local anesthetic, under direct ultrasound visualization, using the same dermatotomy site, a 12 gauge Bard Marquee core needle device placed through  an 11 gauge introducer needle used biopsy of the deeper mass in the UPPER INNER QUADRANT using a lateral approach. At the conclusion of the procedure, a coil shaped tissue marker clip was deployed into the biopsy cavity. The patient tolerated the procedures well without apparent immediate complications. Follow up 2 view mammogram was performed in order to confirm clip placement and was dictated separately. IMPRESSION: Ultrasound guided core needle biopsy (x2) of adjacent masses in the UPPER INNER QUADRANT of the LEFT breast adjacent to sternum. Electronically Signed: By: Hulan Saas M.D. On: 05/28/2023 09:05   MM CLIP PLACEMENT LEFT Result Date: 05/28/2023 CLINICAL DATA:  Confirmation of clip placement after ultrasound-guided core needle biopsy of 2 adjacent masses in the UPPER INNER QUADRANT of the LEFT breast near the sternum EXAM: 2D and 3D DIAGNOSTIC LEFT MAMMOGRAM POST ULTRASOUND BIOPSY COMPARISON:  Previous exam(s). FINDINGS: 2D and 3D full field CC, mediolateral, and lateral-medial mammographic images were obtained following ultrasound guided biopsy of 2 adjacent masses in the UPPER INNER QUADRANT of the LEFT breast near the sternum. The clips are not visible on the mammogram due to the far inner location. Expected post biopsy changes are present in the visualized breast without evidence of hematoma. IMPRESSION: The ribbon shaped and coil shaped tissue marking clips placed at the time of biopsy are not visible on the mammogram due to their far inner location. If localization is ultimately necessary, ultrasound guidance can be used. Final Assessment: Post Procedure Mammograms for Marker Placement Electronically Signed   By: Hulan Saas M.D.   On: 05/28/2023 09:05   NM PET Image Initial (PI) Skull Base To Thigh Result Date:  05/16/2023 CLINICAL DATA:  Initial treatment strategy for marginal zone lymphoma. EXAM: NUCLEAR MEDICINE PET SKULL BASE TO THIGH TECHNIQUE: 9.66 mCi F-18 FDG was injected intravenously. Full-ring PET imaging was performed from the skull base to thigh after the radiotracer. CT data was obtained and used for attenuation correction and anatomic localization. Fasting blood glucose: 95 mg/dl COMPARISON:  CT AP 46/96/2952 FINDINGS: Mediastinal blood pool activity: SUV max 3.13. Liver activity: SUV max NECK: No hypermetabolic lymph nodes in the neck. Incidental CT findings: None. CHEST: No tracer avid lymph nodes within the chest. Within the posterior left mid lung there is an atypical pulmonary cyst which measures 2 cm and has an eccentric mural nodule along the medial wall measuring 7 mm. SUV max associated with the nodule is equal to 1.62, image 25.7. Incidental CT findings: Mild cardiac enlargement. Aortic atherosclerosis and coronary artery calcifications. ABDOMEN/PELVIS: Postoperative changes from previous small bowel resection. Suture line identified within the right hemiabdomen. Increased uptake along the resection site has an SUV max of 7.81, image 127 of the fused PET-CT images. This is nonspecific and may reflect physiologic activity and or postsurgical change. Intense uptake is identified localizing to the descending duodenum where there is circumferential wall thickening identified. SUV max is equal to 5.96, image 96/4. No abnormal tracer activity within the liver, pancreas, spleen or adrenal glands. No tracer avid medius scratch set no tracer avid abdominopelvic lymph nodes. Within the anterior pelvic floor, eccentric right of the midline there is a medium size focus of intense radiotracer uptake with SUV max of 17.91, image 165/5. This appears to localize to the right anterolateral wall of the vagina. Incidental CT findings: Aortic atherosclerosis. No aneurysm. Simple appearing left kidney cyst measures 3.8  cm. SKELETON: Within the medial aspect of the left breast there are 2 tracer avid subjacent soft tissue nodules.  These nodules measure up to 1 cm and have an SUV max of 7.46, image 50/4. Incidental CT findings: Thoracolumbar scoliosis deformity. No tracer avid bone lesions identified. IMPRESSION: 1. No signs of tracer avid lymphadenopathy within the neck, chest, abdomen or pelvis. 2. Postoperative changes from previous small bowel resection. Increased uptake along the resection site is nonspecific and may reflect physiologic activity and or postsurgical change. 3. Intense uptake is identified localizing to the descending duodenum where there is circumferential wall thickening identified. Cannot exclude underlying lymphoma. Consider further evaluation with endoscopy. 4. Within the anterior pelvic floor, eccentric right of the midline there is a medium size focus of intense radiotracer uptake with SUV max of 17.91. This appears to localize to the right anterolateral wall of the vagina. Consider further evaluation with direct visualization. If further imaging is clinically indicated consider contrast enhanced pelvic MRI. 5. There are 2 tracer avid soft tissue nodules within the medial aspect of the left breast. These nodules measure up to 1 cm and have an SUV max of 7.46. Cannot exclude underlying primary breast neoplasm versus lymphoma. 6. Atypical pulmonary cyst within the posterior left mid lung has an eccentric mural nodule along the medial wall measuring 7 mm. SUV max associated with the nodule is equal to 1.62. Follow-up non-contrast CT recommended at 3-6 months to confirm persistence. If unchanged, and solid component remains <6 mm, annual CT is recommended until 5 years of stability has been established. If persistent these nodules should be considered highly suspicious if the solid component of the nodule is 6 mm or greater in size and enlarging. This recommendation follows the consensus statement: Guidelines  for Management of Incidental Pulmonary Nodules Detected on CT Images:From the Fleischner Society 2017; published online before print (10.1148/radiol.[id number]). 7. Coronary artery calcifications. 8.  Aortic Atherosclerosis (ICD10-I70.0). Electronically Signed   By: Signa Kell M.D.   On: 05/16/2023 11:15    All questions were answered. The patient knows to call the clinic with any problems, questions or concerns.     Rachel Moulds, MD 05/31/2023 2:50 PM

## 2023-06-03 ENCOUNTER — Encounter: Payer: Self-pay | Admitting: *Deleted

## 2023-06-04 ENCOUNTER — Telehealth: Payer: Self-pay | Admitting: *Deleted

## 2023-06-04 NOTE — Telephone Encounter (Signed)
This RN attempted to contact [facility] at [phone] per need to follow up on referral placed earlier this month for establishing care in setting of new GYN abnormality noted on recent PET scan.  Obtained VM- message left per above as well as fax number given on the VM - with this RN faxing request as well.

## 2023-06-07 ENCOUNTER — Other Ambulatory Visit: Payer: Self-pay | Admitting: Surgery

## 2023-06-07 DIAGNOSIS — Z853 Personal history of malignant neoplasm of breast: Secondary | ICD-10-CM

## 2023-06-07 DIAGNOSIS — C50912 Malignant neoplasm of unspecified site of left female breast: Secondary | ICD-10-CM | POA: Diagnosis not present

## 2023-06-10 ENCOUNTER — Encounter: Payer: Self-pay | Admitting: *Deleted

## 2023-06-11 ENCOUNTER — Other Ambulatory Visit: Payer: Self-pay | Admitting: Surgery

## 2023-06-11 ENCOUNTER — Encounter: Payer: Self-pay | Admitting: *Deleted

## 2023-06-11 DIAGNOSIS — Z853 Personal history of malignant neoplasm of breast: Secondary | ICD-10-CM

## 2023-06-12 NOTE — Progress Notes (Addendum)
Radiation Oncology         [phone] ________________________________  Name: Kayla Lowe        MRN: [id number]  Date of Service: 06/14/2023 DOB: 01-12-1956  XB:[id number], Bernadene Bell, MD  Abigail Miyamoto, MD     REFERRING PHYSICIAN: Abigail Miyamoto, MD   DIAGNOSIS: The encounter diagnosis was Malignant neoplasm of upper-inner quadrant of left breast in female, estrogen receptor positive (HCC).   Cancer Staging  Malignant neoplasm of upper-inner quadrant of left breast in female, estrogen receptor positive (HCC) Staging form: Breast, AJCC 8th Edition - Clinical stage from 06/14/2023: Stage IA (cT1, cN0, cM0, G3, ER+, PR+, HER2-) - Unsigned Stage prefix: Initial diagnosis Method of lymph node assessment: Clinical Histologic grading system: 3 grade system    HISTORY OF PRESENT ILLNESS: Kayla Lowe is a 68 y.o. female with a history of small bowel marginal zone lymphoma; s/p small bowel resection seen at the request of Dr. Magnus Ivan for newly diagnosed left breast cancer. PET scan on 05/06/2023 as part of her postoperative workup for her lymphoma showed two small hypermetabolic lesions in her left breast. Diagnostic mammogram on 05/24/2023 demonstrated the two lesions measuring up to 1.0 cm and 0.7 cm 19 cm from the nipple. Biopsy of the lesions on 05/28/2023 showed high grade invasive ductal carcinoma. Both lesions were ER/PR positive, HER2 negative; one with a Ki-67 of 30%, the other with a Ki-67 of 20%.    Patient met with medical oncologist, Dr. Al Pimple, to review the findings. Per Dr. Al Pimple recommendations, the patient will proceed with oncotype testing on the surgical specimen to determine the role for chemotherapy. She will also consider anti-estrogen therapy after surgery. Accordingly, the patient was referred to Dr. Magnus Ivan for surgical consultation.   Patient met with Dr. Magnus Ivan on 06/07/23. Patient decided to proceed with a lumpectomy and axillary  sentinel lymph node biopsy. She would also like to see plastic surgery to consider an oncoplastic reduction at the time of her surgery. Her surgery is scheduled for February 19th.    PREVIOUS RADIATION THERAPY: No   PAST MEDICAL HISTORY:  Past Medical History:  Diagnosis Date   GERD (gastroesophageal reflux disease)    Hyperlipidemia    Hypertension    Osteoarthritis        PAST SURGICAL HISTORY: Past Surgical History:  Procedure Laterality Date   ABDOMINAL HYSTERECTOMY  1987   BREAST BIOPSY Left 05/28/2023   [place] LT BREAST BX W LOC DEV EA ADD LESION IMG BX SPEC US GUIDE 05/28/2023 [facility]   BREAST BIOPSY Left 05/28/2023   [place] LT BREAST BX W LOC DEV 1ST LESION IMG BX SPEC US GUIDE 05/28/2023 [facility]   LAPAROTOMY N/A 02/04/2023   Procedure: EXPLORATORY LAPAROTOMY, WITH SMALL BOWEL RESECTION;  Surgeon: Abigail Miyamoto, MD;  Location: [facility];  Service: General;  Laterality: N/A;     FAMILY HISTORY:  Family History  Problem Relation Age of Onset   Cancer Father    Hypertension Brother    Diabetes Brother    Diabetes Paternal Grandfather    Diabetes Paternal Uncle    Breast cancer Paternal Aunt    Colon cancer Neg Hx    Hypercalcemia Neg Hx      SOCIAL HISTORY:  reports that she has never smoked. She has never used smokeless tobacco. She reports that she does not drink alcohol and does not use drugs. She has 3 kids in lives in the [place] area. 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  ALLERGIES: Cozaar [losartan potassium], Lisinopril, and Statins   MEDICATIONS:  Current Outpatient Medications  Medication Sig Dispense Refill   acetaminophen (TYLENOL) 500 MG tablet Take 2 tablets (1,000 mg total) by mouth 4 (four) times daily. (Patient taking differently: Take 1,000 mg by mouth as needed for mild pain (pain score 1-3), moderate pain (pain score 4-6), fever or headache.) 30 tablet 0   aspirin EC 81 MG tablet Take 81 mg by mouth daily. Swallow  whole.     carvedilol (COREG) 6.25 MG tablet Take 1 tablet (6.25 mg total) by mouth 2 (two) times daily with a meal. 180 tablet 1   cetirizine (ZYRTEC) 10 MG tablet Take 10 mg by mouth daily.     Cholecalciferol (VITAMIN D) 50 MCG (2000 UT) CAPS Take 1 capsule by mouth daily.     ezetimibe (ZETIA) 10 MG tablet TAKE 1 TABLET(10 MG) BY MOUTH DAILY 90 tablet 0   famotidine (PEPCID) 40 MG tablet TAKE 1 TABLET(40 MG) BY MOUTH AT BEDTIME 90 tablet 1   Ferric Maltol (ACCRUFER) 30 MG CAPS Take 1 capsule (30 mg total) by mouth in the morning and at bedtime. 180 capsule 0   fluticasone (FLONASE) 50 MCG/ACT nasal spray Place 2 sprays into both nostrils daily. 48 g 1   hydrALAZINE (APRESOLINE) 25 MG tablet TAKE 1 TABLET(25 MG) BY MOUTH THREE TIMES DAILY 270 tablet 1   montelukast (SINGULAIR) 10 MG tablet Take 1 tablet (10 mg total) by mouth at bedtime. 90 tablet 1   pantoprazole (PROTONIX) 20 MG tablet TAKE 1 TABLET(20 MG) BY MOUTH DAILY 90 tablet 1   pravastatin (PRAVACHOL) 40 MG tablet TAKE 1 TABLET(40 MG) BY MOUTH DAILY 90 tablet 1   tiZANidine (ZANAFLEX) 2 MG tablet Take 1 tablet (2 mg total) by mouth every 8 (eight) hours as needed for muscle spasms. 270 tablet 0   traMADol (ULTRAM) 50 MG tablet Take 1 tablet (50 mg total) by mouth every 6 (six) hours as needed. 270 tablet 0   triamcinolone cream (KENALOG) 0.5 % Apply 1 Application topically as needed.     No current facility-administered medications for this encounter.     REVIEW OF SYSTEMS: On review of systems, the patient reports that she is doing well overall. She denies any breast specific complaints.      PHYSICAL EXAM:  Wt Readings from Last 3 Encounters:  06/14/23 89.9 kg  06/13/23 89.8 kg  05/31/23 91.8 kg   Temp Readings from Last 3 Encounters:  06/14/23 98.1 F (36.7 C)  05/31/23 97.8 F (36.6 C) (Temporal)  05/16/23 97.6 F (36.4 C) (Temporal)   BP Readings from Last 3 Encounters:  06/14/23 (!) 185/89  06/13/23 (!)  140/80  05/31/23 (!) 182/83   Pulse Readings from Last 3 Encounters:  06/14/23 90  06/13/23 91  05/31/23 98    /10  In general this is a well appearing female in no acute distress. She's alert and oriented x4 and appropriate throughout the examination. Cardiopulmonary assessment is negative for acute distress and she exhibits normal effort.     ECOG = 0  0 - Asymptomatic (Fully active, able to carry on all predisease activities without restriction)  1 - Symptomatic but completely ambulatory (Restricted in physically strenuous activity but ambulatory and able to carry out work of a light or sedentary nature. For example, light housework, office work)  2 - Symptomatic, <50% in bed during the day (Ambulatory and capable of all self care but unable to carry out  any work activities. Up and about more than 50% of waking hours)  3 - Symptomatic, >50% in bed, but not bedbound (Capable of only limited self-care, confined to bed or chair 50% or more of waking hours)  4 - Bedbound (Completely disabled. Cannot carry on any self-care. Totally confined to bed or chair)  5 - Death   Santiago Glad MM, Creech RH, Tormey DC, et al. [phone]). "Toxicity and response criteria of the [facility]". Am. Evlyn Clines. Oncol. 5 (6): 649-55    LABORATORY DATA:  Lab Results  Component Value Date   WBC 4.3 04/18/2023   HGB 12.4 04/18/2023   HCT 38.6 04/18/2023   MCV 94.4 04/18/2023   PLT 339 04/18/2023   Lab Results  Component Value Date   NA 142 04/18/2023   K 3.8 04/18/2023   CL 109 04/18/2023   CO2 27 04/18/2023   Lab Results  Component Value Date   ALT 7 04/18/2023   AST 11 (L) 04/18/2023   ALKPHOS 100 04/18/2023   BILITOT 0.3 04/18/2023      RADIOGRAPHY: MM DIAG BREAST TOMO UNI LEFT Addendum Date: 05/31/2023 ADDENDUM REPORT: 05/31/2023 07:52 ADDENDUM: This is an addendum to diagnostic exam dated 05/24/2023. IMPRESSION: 2 adjacent suspicious masses in the 10 o'clock region of  the LEFT (diagnostic report stated right) breast 19 cm from the nipple. Recommendation: Ultrasound-guided core biopsy of the 2 adjacent masses in the 10 o'clock region of the LEFT (diagnostic report stated left) breast is recommended. Electronically Signed   By: Baird Lyons M.D.   On: 05/31/2023 07:52   Result Date: 05/31/2023 CLINICAL DATA:  68 year old female with known lymphoma. Recent PET scan demonstrated 2 soft tissue nodules in the medial aspect of the left breast. EXAM: DIGITAL DIAGNOSTIC UNILATERAL LEFT MAMMOGRAM WITH TOMOSYNTHESIS AND CAD; ULTRASOUND LEFT BREAST LIMITED TECHNIQUE: Left digital diagnostic mammography and breast tomosynthesis was performed. The images were evaluated with computer-aided detection. ; Targeted ultrasound examination of the left breast was performed. COMPARISON:  Previous exam(s) including NM PET scan dated 05/06/2023. ACR Breast Density Category b: There are scattered areas of fibroglandular density. FINDINGS: No suspicious mass, malignant type microcalcifications or distortion detected in the left breast. Targeted ultrasound is performed, showing 2 adjacent irregular hypoechoic masses with angular margins in the left breast at 10 o'clock 19 cm from the nipple measuring 1 x 0.5 x 0.7 cm and 6 x 6 x 7 mm. IMPRESSION: Two adjacent suspicious masses in the 10 o'clock region of the right breast 19 cm from the nipple. RECOMMENDATION: Ultrasound-guided core biopsy of the 2 adjacent masses in the 10 o'clock region of the right breast 19 cm from the nipple is recommended. The masses are adjacent to each other can be biopsied together. I have discussed the findings and recommendations with the patient. If applicable, a reminder letter will be sent to the patient regarding the next appointment. BI-RADS CATEGORY  4: Suspicious. Electronically Signed: By: Baird Lyons M.D. On: 05/24/2023 14:41   [place] LIMITED ULTRASOUND INCLUDING AXILLA LEFT BREAST  Addendum Date: 05/31/2023 ADDENDUM  REPORT: 05/31/2023 07:52 ADDENDUM: This is an addendum to diagnostic exam dated 05/24/2023. IMPRESSION: 2 adjacent suspicious masses in the 10 o'clock region of the LEFT (diagnostic report stated right) breast 19 cm from the nipple. Recommendation: Ultrasound-guided core biopsy of the 2 adjacent masses in the 10 o'clock region of the LEFT (diagnostic report stated left) breast is recommended. Electronically Signed   By: Baird Lyons M.D.   On: 05/31/2023  07:52   Result Date: 05/31/2023 CLINICAL DATA:  68 year old female with known lymphoma. Recent PET scan demonstrated 2 soft tissue nodules in the medial aspect of the left breast. EXAM: DIGITAL DIAGNOSTIC UNILATERAL LEFT MAMMOGRAM WITH TOMOSYNTHESIS AND CAD; ULTRASOUND LEFT BREAST LIMITED TECHNIQUE: Left digital diagnostic mammography and breast tomosynthesis was performed. The images were evaluated with computer-aided detection. ; Targeted ultrasound examination of the left breast was performed. COMPARISON:  Previous exam(s) including NM PET scan dated 05/06/2023. ACR Breast Density Category b: There are scattered areas of fibroglandular density. FINDINGS: No suspicious mass, malignant type microcalcifications or distortion detected in the left breast. Targeted ultrasound is performed, showing 2 adjacent irregular hypoechoic masses with angular margins in the left breast at 10 o'clock 19 cm from the nipple measuring 1 x 0.5 x 0.7 cm and 6 x 6 x 7 mm. IMPRESSION: Two adjacent suspicious masses in the 10 o'clock region of the right breast 19 cm from the nipple. RECOMMENDATION: Ultrasound-guided core biopsy of the 2 adjacent masses in the 10 o'clock region of the right breast 19 cm from the nipple is recommended. The masses are adjacent to each other can be biopsied together. I have discussed the findings and recommendations with the patient. If applicable, a reminder letter will be sent to the patient regarding the next appointment. BI-RADS CATEGORY  4: Suspicious.  Electronically Signed: By: Baird Lyons M.D. On: 05/24/2023 14:41   [place] LT BREAST BX W LOC DEV 1ST LESION IMG BX SPEC US GUIDE Addendum Date: 05/29/2023 ADDENDUM REPORT: 05/29/2023 14:03 ADDENDUM: Pathology revealed: GRADE III INVASIVE MAMMARY CARCINOMA of the LEFT breast, superficial mass in the UPPER INNER QUADRANT, (ribbon clip). This was found to be concordant by Dr. Quincy Carnes. Pathology revealed: GRADE III INVASIVE MAMMARY CARCINOMA of the LEFT breast, deeper mass in the UPPER INNER QUADRANT, (coil clip). This was found to be concordant by Dr. Quincy Carnes. Pathology results were discussed with the patient by telephone. The patient reported doing well after the biopsies with tenderness at the sites. Post biopsy instructions and care were reviewed and questions were answered. The patient was encouraged to call [facility] for any additional concerns. My direct phone number was provided. Dr. Carman Ching and Dr. Rachel Moulds were notified of biopsy results via secure EPIC message on May 29, 2023. Medical Oncology consultation has been arranged with Dr. Rachel Moulds at [facility] on May 31, 2023. Surgical consultation request was sent to [facility] for Dr. Carman Ching via secure EPIC message on May 29, 2023. Note: Due to the far medial location of these adjacent masses, localization prior to excision will need to be performed using ultrasound guidance. Pathology results reported by Rene Kocher, RN on 05/29/2023. Electronically Signed   By: Hulan Saas M.D.   On: 05/29/2023 14:03   Result Date: 05/29/2023 CLINICAL DATA:  68 year old with a recent diagnosis of marginal zone lymphoma, shown on a PET-CT 05/06/2023 to have 2 adjacent FDG avid masses in the inner LEFT breast. Ultrasound confirmed 2 adjacent masses at 10 o'clock 19 cm from the nipple, the more superficial mass measuring approximately 1.1 cm and the deeper mass  measuring approximately 1.4 cm, spanning 2.5 cm in total. These masses were inconspicuous on mammography to their far medial location. EXAM: ULTRASOUND GUIDED LEFT BREAST CORE NEEDLE BIOPSY x 2 COMPARISON:  Previous exam(s). PROCEDURE: I met with the patient and we discussed the procedure of ultrasound-guided biopsy, including benefits and alternatives. We  discussed the high likelihood of a successful procedure. We discussed the risks of the procedure, including infection, bleeding, tissue injury, clip migration, and inadequate sampling. Informed written consent was given. The usual time-out protocol was performed immediately prior to the procedure. #1) Superficial mass, lesion quadrant: UPPER INNER QUADRANT. Using sterile technique with chlorhexidine as skin antisepsis, 1% Lidocaine as local anesthetic, under direct ultrasound visualization, a 12 gauge Bard Marquee core needle device placed through an 11 gauge introducer needle was used to perform biopsy of the more superficial mass in the UPPER INNER QUADRANT using a lateral approach. At the conclusion of the procedure, a ribbon shaped tissue marker clip was deployed into the biopsy cavity. #2) Deep mass, lesion quadrant: UPPER INNER QUADRANT. Using sterile technique with chlorhexidine as skin antisepsis, 1% lidocaine as local anesthetic, under direct ultrasound visualization, using the same dermatotomy site, a 12 gauge Bard Marquee core needle device placed through an 11 gauge introducer needle used biopsy of the deeper mass in the UPPER INNER QUADRANT using a lateral approach. At the conclusion of the procedure, a coil shaped tissue marker clip was deployed into the biopsy cavity. The patient tolerated the procedures well without apparent immediate complications. Follow up 2 view mammogram was performed in order to confirm clip placement and was dictated separately. IMPRESSION: Ultrasound guided core needle biopsy (x2) of adjacent masses in the UPPER INNER  QUADRANT of the LEFT breast adjacent to sternum. Electronically Signed: By: Hulan Saas M.D. On: 05/28/2023 09:05   [place] LT BREAST BX W LOC DEV EA ADD LESION IMG BX SPEC US GUIDE Addendum Date: 05/29/2023 ADDENDUM REPORT: 05/29/2023 14:03 ADDENDUM: Pathology revealed: GRADE III INVASIVE MAMMARY CARCINOMA of the LEFT breast, superficial mass in the UPPER INNER QUADRANT, (ribbon clip). This was found to be concordant by Dr. Quincy Carnes. Pathology revealed: GRADE III INVASIVE MAMMARY CARCINOMA of the LEFT breast, deeper mass in the UPPER INNER QUADRANT, (coil clip). This was found to be concordant by Dr. Quincy Carnes. Pathology results were discussed with the patient by telephone. The patient reported doing well after the biopsies with tenderness at the sites. Post biopsy instructions and care were reviewed and questions were answered. The patient was encouraged to call [facility] for any additional concerns. My direct phone number was provided. Dr. Carman Ching and Dr. Rachel Moulds were notified of biopsy results via secure EPIC message on May 29, 2023. Medical Oncology consultation has been arranged with Dr. Rachel Moulds at [facility] on May 31, 2023. Surgical consultation request was sent to [facility] for Dr. Carman Ching via secure EPIC message on May 29, 2023. Note: Due to the far medial location of these adjacent masses, localization prior to excision will need to be performed using ultrasound guidance. Pathology results reported by Rene Kocher, RN on 05/29/2023. Electronically Signed   By: Hulan Saas M.D.   On: 05/29/2023 14:03   Result Date: 05/29/2023 CLINICAL DATA:  68 year old with a recent diagnosis of marginal zone lymphoma, shown on a PET-CT 05/06/2023 to have 2 adjacent FDG avid masses in the inner LEFT breast. Ultrasound confirmed 2 adjacent masses at 10 o'clock 19 cm from the nipple, the more superficial  mass measuring approximately 1.1 cm and the deeper mass measuring approximately 1.4 cm, spanning 2.5 cm in total. These masses were inconspicuous on mammography to their far medial location. EXAM: ULTRASOUND GUIDED LEFT BREAST CORE NEEDLE BIOPSY x 2 COMPARISON:  Previous exam(s). PROCEDURE: I met with  the patient and we discussed the procedure of ultrasound-guided biopsy, including benefits and alternatives. We discussed the high likelihood of a successful procedure. We discussed the risks of the procedure, including infection, bleeding, tissue injury, clip migration, and inadequate sampling. Informed written consent was given. The usual time-out protocol was performed immediately prior to the procedure. #1) Superficial mass, lesion quadrant: UPPER INNER QUADRANT. Using sterile technique with chlorhexidine as skin antisepsis, 1% Lidocaine as local anesthetic, under direct ultrasound visualization, a 12 gauge Bard Marquee core needle device placed through an 11 gauge introducer needle was used to perform biopsy of the more superficial mass in the UPPER INNER QUADRANT using a lateral approach. At the conclusion of the procedure, a ribbon shaped tissue marker clip was deployed into the biopsy cavity. #2) Deep mass, lesion quadrant: UPPER INNER QUADRANT. Using sterile technique with chlorhexidine as skin antisepsis, 1% lidocaine as local anesthetic, under direct ultrasound visualization, using the same dermatotomy site, a 12 gauge Bard Marquee core needle device placed through an 11 gauge introducer needle used biopsy of the deeper mass in the UPPER INNER QUADRANT using a lateral approach. At the conclusion of the procedure, a coil shaped tissue marker clip was deployed into the biopsy cavity. The patient tolerated the procedures well without apparent immediate complications. Follow up 2 view mammogram was performed in order to confirm clip placement and was dictated separately. IMPRESSION: Ultrasound guided core needle  biopsy (x2) of adjacent masses in the UPPER INNER QUADRANT of the LEFT breast adjacent to sternum. Electronically Signed: By: Hulan Saas M.D. On: 05/28/2023 09:05   MM CLIP PLACEMENT LEFT Result Date: 05/28/2023 CLINICAL DATA:  Confirmation of clip placement after ultrasound-guided core needle biopsy of 2 adjacent masses in the UPPER INNER QUADRANT of the LEFT breast near the sternum EXAM: 2D and 3D DIAGNOSTIC LEFT MAMMOGRAM POST ULTRASOUND BIOPSY COMPARISON:  Previous exam(s). FINDINGS: 2D and 3D full field CC, mediolateral, and lateral-medial mammographic images were obtained following ultrasound guided biopsy of 2 adjacent masses in the UPPER INNER QUADRANT of the LEFT breast near the sternum. The clips are not visible on the mammogram due to the far inner location. Expected post biopsy changes are present in the visualized breast without evidence of hematoma. IMPRESSION: The ribbon shaped and coil shaped tissue marking clips placed at the time of biopsy are not visible on the mammogram due to their far inner location. If localization is ultimately necessary, ultrasound guidance can be used. Final Assessment: Post Procedure Mammograms for Marker Placement Electronically Signed   By: Hulan Saas M.D.   On: 05/28/2023 09:05       IMPRESSION/PLAN: 1. Stage IA (cT1, cN0, cM0) high grade invasive ductal carcinoma, ER/PR+, HER2-   Today, we discussed the risks, benefits, and side effects of radiotherapy. Dr. Mitzi Hansen recommends radiotherapy to the left to reduce her risk of locoregional recurrence by 2/3.  We discussed that radiation would take approximately 4 weeks to complete and that we would give the patient a few weeks to heal following surgery before starting treatment planning. If her nodes are positive, patient would receive a 6.5-week treatment. If chemotherapy were to be given, this would precede radiotherapy. We spoke about acute effects including skin irritation and fatigue as well as much  less common late effects including internal organ injury or irritation. We spoke about the latest technology that is used to minimize the risk of late effects for patients undergoing radiotherapy to the breast or chest wall. No guarantees of treatment were  given. The patient is enthusiastic about proceeding with treatment. We look forward to participating in the patient's care.  We will await her referral back to me for postoperative follow-up and eventual CT simulation/treatment planning.   In a visit lasting 60 minutes, greater than 50% of the time was spent face to face discussing the patient's condition, in preparation for the discussion, and coordinating the patient's care.   The above documentation reflects my direct findings during this shared patient visit. Please see the separate note by Dr. Mitzi Hansen on this date for the remainder of the patient's plan of care.    Bryan Lemma, PA-C   **Disclaimer: This note was dictated with voice recognition software. Similar sounding words can inadvertently be transcribed and this note may contain transcription errors which may not have been corrected upon publication of note.**

## 2023-06-13 ENCOUNTER — Ambulatory Visit: Payer: Medicare Other | Admitting: Nurse Practitioner

## 2023-06-13 ENCOUNTER — Telehealth: Payer: Self-pay | Admitting: *Deleted

## 2023-06-13 ENCOUNTER — Encounter: Payer: Self-pay | Admitting: Internal Medicine

## 2023-06-13 ENCOUNTER — Encounter: Payer: Self-pay | Admitting: Nurse Practitioner

## 2023-06-13 VITALS — BP 140/80 | HR 91 | Ht 64.0 in | Wt 198.0 lb

## 2023-06-13 DIAGNOSIS — K219 Gastro-esophageal reflux disease without esophagitis: Secondary | ICD-10-CM

## 2023-06-13 DIAGNOSIS — R948 Abnormal results of function studies of other organs and systems: Secondary | ICD-10-CM | POA: Diagnosis not present

## 2023-06-13 NOTE — Patient Instructions (Addendum)
You have been scheduled for an endoscopy. Please follow written instructions given to you at your visit today.  If you use inhalers (even only as needed), please bring them with you on the day of your procedure.  If you take any of the following medications, they will need to be adjusted prior to your procedure:   DO NOT TAKE 7 DAYS PRIOR TO TEST- Trulicity (dulaglutide) Ozempic, Wegovy (semaglutide) Mounjaro (tirzepatide) Bydureon Bcise (exanatide extended release)  DO NOT TAKE 1 DAY PRIOR TO YOUR TEST Rybelsus (semaglutide) Adlyxin (lixisenatide) Victoza (liraglutide) Byetta (exanatide)   If your blood pressure at your visit was 140/90 or greater, please contact your primary care physician to follow up on this.  _______________________________________________________  If you are age 54 or older, your body mass index should be between 23-30. Your Body mass index is 33.99 kg/m. If this is out of the aforementioned range listed, please consider follow up with your Primary Care Provider.  If you are age 23 or younger, your body mass index should be between 19-25. Your Body mass index is 33.99 kg/m. If this is out of the aformentioned range listed, please consider follow up with your Primary Care Provider.   ________________________________________________________  The [place] [facility] providers would like to encourage you to use [facility] to communicate with providers for non-urgent requests or questions.  Due to long hold times on the telephone, sending your provider a message by [facility] may be a faster and more efficient way to get a response.  Please allow 48 business hours for a response.  Please remember that this is for non-urgent requests.  _______________________________________________________  Due to recent changes in healthcare laws, you may see the results of your imaging and laboratory studies on MyChart before your provider has had a chance to review them.  We understand that in  some cases there may be results that are confusing or concerning to you. Not all laboratory results come back in the same time frame and the provider may be waiting for multiple results in order to interpret others.  Please give [place] 48 hours in order for your provider to thoroughly review all the results before contacting the office for clarification of your results.    Thank you for entrusting me with your care and choosing [facility].  Willette Cluster NP

## 2023-06-13 NOTE — Progress Notes (Signed)
Brief Narrative 68 y.o. yo female known remotely to Dr. 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  Patient referred for abnormal duodenum on PET scan.    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 She is status post exploratory lap with small bowel resection.  Resection showed low-grade B-cell lymphoma.  PET scan showed thickening of the duodenum.  PET scan late December shows intense uptake is identified localizing to the descending duodenum where there is circumferential wall thickening identified. PET also showed uptake in vagina.   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  This was found on PET scan done following surgery for small bowel lymphoma.  Chronic GERD.  Symptoms well-managed with Protonix in the morning and Pepcid at night  See PMH for any additional medical history   PLAN    --Schedule for EGD for evaluation of duodenum. The risks and benefits of EGD with possible biopsies were discussed with the patient who agrees to proceed.  -- She is being followed by General surgery (Dr. Magnus Ivan).  Based on 06/07/2023 office note the plan is for radioactive seed guided left breast lumpectomy x 2 with axillary sentinel lymph node biopsy -- She has appointment with gynecology in March to address abnormal vagina on PET scan -- Continue colon cancer screening due around September 2025   HPI   Chief complaint :  None.  Abnormal small bowel on PET scan  Patient was hospitalized in September 2020 for with an SBO.  She underwent an exploratory laparotomy with small bowel resection. 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  Recent postop PET scan late December showed uptake in the vagina and also identified left breast cancer.  She is being followed by general surgery, scheduled for lumpectomy.  She sees GYN in March.  Zephyra has no GI complaints.  She has no abdominal pain, nausea or vomiting.  She has not seen any blood in her stool.  No family history of colon cancer.   Hemoglobin 12.5 24 normal at 12.4  GI History / Studies   **May not be a complete list of studies  Screening colonoscopy September 2025 -- Left-sided diverticulosis associated with mucosal edema and a tortuous lumen.  Exam otherwise normal.  Repeat colonoscopy recommended in 10 years   Labs      Latest Ref Rng & Units 04/18/2023   10:39 AM 03/13/2023   11:07 AM 02/22/2023   10:56 AM  CBC  WBC 4.0 - 10.5 K/uL 4.3  5.0  7.1   Hemoglobin 12.0 - 15.0 g/dL 29.5  28.4  13.2   Hematocrit 36.0 - 46.0 % 38.6  37.3  35.4   Platelets 150 - 400 K/uL 339  426.0  666     Lab Results  Component Value Date   LIPASE 29 02/03/2023      Latest Ref Rng & Units 04/18/2023   10:39 AM 02/22/2023   10:56 AM 02/15/2023    4:28 AM  CMP  Glucose 70 - 99 mg/dL 440  95  99   BUN 8 - 23 mg/dL 10  9  11    Creatinine 0.44 - 1.00 mg/dL 1.02  7.25  3.66  Sodium 135 - 145 mmol/L 142  140  137   Potassium 3.5 - 5.1 mmol/L 3.8  4.6  3.3   Chloride 98 - 111 mmol/L 109  107  108   CO2 22 - 32 mmol/L 27  28  22    Calcium 8.9 - 10.3 mg/dL 09.8  11.9  8.6   Total Protein 6.5 - 8.1 g/dL 7.3  7.1    Total Bilirubin <1.2 mg/dL 0.3  0.4    Alkaline Phos 38 - 126 U/L 100  107    AST 15 - 41 U/L 11  14    ALT 0 - 44 U/L 7  18       Past Medical History:  Diagnosis Date   GERD (gastroesophageal reflux disease)    Hyperlipidemia    Hypertension    Osteoarthritis    Past Surgical History:  Procedure Laterality Date   ABDOMINAL HYSTERECTOMY  1987   BREAST BIOPSY Left 05/28/2023   [place] LT BREAST BX W LOC DEV EA ADD LESION IMG BX SPEC  US GUIDE 05/28/2023 [facility]   BREAST BIOPSY Left 05/28/2023   [place] LT BREAST BX W LOC DEV 1ST LESION IMG BX SPEC US GUIDE 05/28/2023 [facility]   LAPAROTOMY N/A 02/04/2023   Procedure: EXPLORATORY LAPAROTOMY, WITH SMALL BOWEL RESECTION;  Surgeon: Abigail Miyamoto, MD;  Location: [facility];  Service: General;  Laterality: N/A;   Family History  Problem Relation Age of Onset   Cancer Father    Hypertension Brother    Diabetes Brother    Diabetes Paternal Grandfather    Diabetes Paternal Uncle    Colon cancer Neg Hx    Hypercalcemia Neg Hx    Social History   Tobacco Use   Smoking status: Never   Smokeless tobacco: Never  Vaping Use   Vaping status: Never Used  Substance Use Topics   Alcohol use: No   Drug use: No   Current Outpatient Medications  Medication Sig Dispense Refill   acetaminophen (TYLENOL) 500 MG tablet Take 2 tablets (1,000 mg total) by mouth 4 (four) times daily. (Patient taking differently: Take 1,000 mg by mouth as needed for mild pain (pain score 1-3), moderate pain (pain score 4-6), fever or headache.) 30 tablet 0   aspirin EC 81 MG tablet Take 81 mg by mouth daily. Swallow whole.     carvedilol (COREG) 6.25 MG tablet Take 1 tablet (6.25 mg total) by mouth 2 (two) times daily with a meal. 180 tablet 1   cetirizine (ZYRTEC) 10 MG tablet Take 10 mg by mouth daily.     Cholecalciferol (VITAMIN D) 50 MCG (2000 UT) CAPS Take 1 capsule by mouth daily.     ezetimibe (ZETIA) 10 MG tablet TAKE 1 TABLET(10 MG) BY MOUTH DAILY 90 tablet 0   famotidine (PEPCID) 40 MG tablet TAKE 1 TABLET(40 MG) BY MOUTH AT BEDTIME 90 tablet 1   Ferric Maltol (ACCRUFER) 30 MG CAPS Take 1 capsule (30 mg total) by mouth in the morning and at bedtime. 180 capsule 0   fluticasone (FLONASE) 50 MCG/ACT nasal spray Place 2 sprays into both nostrils daily. 48 g 1   hydrALAZINE (APRESOLINE) 25 MG tablet TAKE 1 TABLET(25 MG) BY MOUTH THREE TIMES DAILY 270 tablet 1   montelukast (SINGULAIR)  10 MG tablet Take 1 tablet (10 mg total) by mouth at bedtime. 90 tablet 1   pantoprazole (PROTONIX) 20 MG tablet TAKE 1 TABLET(20 MG) BY MOUTH DAILY 90 tablet 1   pravastatin (  PRAVACHOL) 40 MG tablet TAKE 1 TABLET(40 MG) BY MOUTH DAILY 90 tablet 1   tiZANidine (ZANAFLEX) 2 MG tablet Take 1 tablet (2 mg total) by mouth every 8 (eight) hours as needed for muscle spasms. 270 tablet 0   traMADol (ULTRAM) 50 MG tablet Take 1 tablet (50 mg total) by mouth every 6 (six) hours as needed. 270 tablet 0   triamcinolone cream (KENALOG) 0.5 % Apply 1 Application topically as needed.     No current facility-administered medications for this visit.   Allergies  Allergen Reactions   Cozaar [Losartan Potassium]     Caused cough and sweats. Tingling in legs.   Lisinopril Cough   Statins Other (See Comments)    myalgias     Review of Systems: Positive for back pain, sinus trouble, sleeping problems, night sweats.  All other systems reviewed and negative except where noted in HPI.   Wt Readings from Last 3 Encounters:  06/13/23 198 lb (89.8 kg)  05/31/23 202 lb 4.8 oz (91.8 kg)  05/16/23 199 lb 9.6 oz (90.5 kg)    Physical Exam:  BP (!) 160/80   Pulse 91   Ht 5\' 4"  (1.626 m)   Wt 198 lb (89.8 kg)   BMI 33.99 kg/m  Constitutional:  Pleasant, generally well appearing female in no acute distress. Psychiatric:  Normal mood and affect. Behavior is normal. EENT: Pupils normal.  Conjunctivae are normal. No scleral icterus. Neck supple.  Cardiovascular: Normal rate, regular rhythm.  Pulmonary/chest: Effort normal and breath sounds normal. No wheezing, rales or rhonchi. Abdominal: Soft, nondistended, nontender. Bowel sounds active throughout. There are no masses palpable. No hepatomegaly. Neurological: Alert and oriented to person place and time.   Willette Cluster, NP  06/13/2023, 8:31 AM  Cc:  Referring Provider Rachel Moulds, MD

## 2023-06-13 NOTE — Progress Notes (Signed)
New Breast Cancer Diagnosis: Left Breast UIQ    Histology per Pathology Report:  Invasive Ductal Carcinoma 05/28/2023  1) Receptor Status: ER(positive), PR (positive), Her2-neu (negative), Ki-(30%)   2) Receptor Status: ER(positive), PR (positive), Her2-neu (negative), Ki-(20%)  Surgeon and surgical plan, if any:  Dr. Magnus Ivan -Radioactive seed guided left breast lumpectomy x2 and SLN biopsy 07/03/2023   Medical oncologist, treatment if any:   Dr. Al Pimple 05/31/2023 -Discussed the need for surgical removal and potential subsequent treatments including chemotherapy and anti-estrogen therapy. -Refer to Dr. Magnus Ivan for surgical consultation on 06/07/2023. -Consider Oncotype testing post-surgery if her 2 neg to guide decision on chemotherapy.   Family History of Breast/Ovarian/Prostate Cancer: Paternal Aunt had breast cancer.  Lymphedema issues, if any: No    Pain issues, if any: No    SAFETY ISSUES: Prior radiation? No Pacemaker/ICD? No Possible current pregnancy? Hysterectomy Is the patient on methotrexate? No  Current Complaints / other details:

## 2023-06-13 NOTE — Telephone Encounter (Signed)
RETURNED PATIENT'S PHONE CALL, SPOKE WITH PATIENT. ?

## 2023-06-14 ENCOUNTER — Ambulatory Visit
Admission: RE | Admit: 2023-06-14 | Discharge: 2023-06-14 | Disposition: A | Discharge status: Home / Self Care | Payer: Medicare Other | Source: Ambulatory Visit | Attending: Radiation Oncology | Admitting: Radiation Oncology

## 2023-06-14 ENCOUNTER — Encounter: Payer: Self-pay | Admitting: Radiation Oncology

## 2023-06-14 ENCOUNTER — Encounter: Payer: Self-pay | Admitting: Internal Medicine

## 2023-06-14 VITALS — BP 185/89 | HR 90 | Temp 98.1°F | Resp 18 | Ht 63.5 in | Wt 198.2 lb

## 2023-06-14 DIAGNOSIS — E785 Hyperlipidemia, unspecified: Secondary | ICD-10-CM | POA: Diagnosis not present

## 2023-06-14 DIAGNOSIS — C50212 Malignant neoplasm of upper-inner quadrant of left female breast: Secondary | ICD-10-CM | POA: Insufficient documentation

## 2023-06-14 DIAGNOSIS — Z803 Family history of malignant neoplasm of breast: Secondary | ICD-10-CM | POA: Insufficient documentation

## 2023-06-14 DIAGNOSIS — Z79899 Other long term (current) drug therapy: Secondary | ICD-10-CM | POA: Diagnosis not present

## 2023-06-14 DIAGNOSIS — Z170 Estrogen receptor positive status [ER+]: Secondary | ICD-10-CM | POA: Diagnosis not present

## 2023-06-14 DIAGNOSIS — I10 Essential (primary) hypertension: Secondary | ICD-10-CM | POA: Insufficient documentation

## 2023-06-14 DIAGNOSIS — Z7982 Long term (current) use of aspirin: Secondary | ICD-10-CM | POA: Insufficient documentation

## 2023-06-14 DIAGNOSIS — Z809 Family history of malignant neoplasm, unspecified: Secondary | ICD-10-CM | POA: Insufficient documentation

## 2023-06-14 DIAGNOSIS — K219 Gastro-esophageal reflux disease without esophagitis: Secondary | ICD-10-CM | POA: Diagnosis not present

## 2023-06-14 DIAGNOSIS — M1990 Unspecified osteoarthritis, unspecified site: Secondary | ICD-10-CM | POA: Insufficient documentation

## 2023-06-15 DIAGNOSIS — C50912 Malignant neoplasm of unspecified site of left female breast: Secondary | ICD-10-CM

## 2023-06-15 HISTORY — DX: Malignant neoplasm of unspecified site of left female breast: C50.912

## 2023-06-17 ENCOUNTER — Inpatient Hospital Stay: Payer: Medicare Other | Attending: Hematology and Oncology | Admitting: Hematology and Oncology

## 2023-06-17 ENCOUNTER — Encounter: Payer: Self-pay | Admitting: Hematology and Oncology

## 2023-06-17 VITALS — BP 163/64 | HR 86 | Temp 97.2°F | Resp 16 | Wt 196.9 lb

## 2023-06-17 DIAGNOSIS — C8580 Other specified types of non-Hodgkin lymphoma, unspecified site: Secondary | ICD-10-CM

## 2023-06-17 DIAGNOSIS — C8309 Small cell B-cell lymphoma, extranodal and solid organ sites: Secondary | ICD-10-CM | POA: Diagnosis not present

## 2023-06-17 DIAGNOSIS — Z803 Family history of malignant neoplasm of breast: Secondary | ICD-10-CM | POA: Insufficient documentation

## 2023-06-17 DIAGNOSIS — Z170 Estrogen receptor positive status [ER+]: Secondary | ICD-10-CM | POA: Insufficient documentation

## 2023-06-17 DIAGNOSIS — C50912 Malignant neoplasm of unspecified site of left female breast: Secondary | ICD-10-CM | POA: Diagnosis not present

## 2023-06-17 DIAGNOSIS — C50212 Malignant neoplasm of upper-inner quadrant of left female breast: Secondary | ICD-10-CM | POA: Insufficient documentation

## 2023-06-17 NOTE — Progress Notes (Signed)
[name] FOLLOW UP NOTE  Patient Care Team: Etta Grandchild, MD as PCP - General (Internal Medicine) Norva Pavlov, OD as Consulting Physician (Optometry) Szabat, Vinnie Level, [facility] (Inactive) as Pharmacist (Pharmacist) Rachel Moulds, MD as Consulting Physician (Hematology and Oncology) Pershing Proud, RN as Oncology Nurse Navigator Pershing Proud, RN as Registered Nurse Abigail Miyamoto, MD as Consulting Physician (General Surgery)  CHIEF COMPLAINTS/PURPOSE OF CONSULTATION:  Marginal zone lymphoma  ASSESSMENT & PLAN:   This is a very 68 yr old female pt with new diagnosis of small bowel marginal zone lymphoma here for hospital follow up.  Marginal Zone Lymphoma  She originally presented to the emergency room in September with pain in the left upper quadrant and was found to have small bowel obstruction. She had CT abdomen pelvis which showed small bowel obstruction with transition point in the central small bowel mesentery suspicious for adhesion. She had ex lap with small bowel resection and this showed a large small bowel mass which was not obstructing the small bowel lumen. 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 Since her small bowel surgery, she remains asymptomatic, wound has nearly healed. Most recent PET/CT with no signs of tracer avid lymphadenopathy, postop changes noted from the previous small bowel resection, increased uptake along the resection site is nonspecific could be physiological or postsurgical, intense uptake is identified localizing to the descending duodenum where there is circumferential wall thickening identified cannot exclude.  Endoscopy is pending, she has a follow-up with gynecology next month.  Will continue to monitor the marginal zone lymphoma.  Breast  Cancer Two small areas in the left breast at 10 o'clock position, one measuring 1 cm and the other 7 mm. Both biopsied and confirmed as breast cancer. Estrogen and progesterone positive, HER2 status negative.  -Surgery scheduled for Feb 19, please send for oncotype after surgery. We have discussed about Oncotype Dx score which is a well validated prognostic scoring system which can predict outcome with endocrine therapy alone and whether chemotherapy reduces recurrence.  Typically in patients with ER positive cancers that are node negative if the RS score is high typically greater than or equal to 26, chemotherapy is recommended.  If chemotherapy is not warranted, she will then proceed with radiation followed by antiestrogen therapy. -Consider anti-estrogen therapy post-surgery (tamoxifen, anastrozole, letrozole).  Gynecological Concern Activity noted in the vagina on PET scan, likely unrelated to lymphoma or breast cancer. No suspicion of malignancy at this time. -Refer for gynecological exam. This is pending. Scheduled in March.  Gastrointestinal Concern Thickening in the duodenum noted on PET scan. Seen by gastroenterology, endoscopy planned.  HTN, likely situational given ongoing multiple issues.  She also has a plan to follow-up with her PCP for titration of antihypertensive medication.  FU as recommended.  HISTORY OF PRESENTING ILLNESS:  Kayla Lowe 68 y.o. female is here because of marginal zone lymphoma of small bowel.  Oncology History  Malignant neoplasm of upper-inner quadrant of left breast in female, estrogen receptor positive (HCC)  06/14/2023 Initial Diagnosis   Malignant neoplasm of upper-inner quadrant of left breast in female, estrogen receptor positive (HCC)   06/14/2023 Cancer Staging   Staging form: Breast, AJCC 8th Edition - Clinical stage from 06/14/2023: Stage IA (cT1, cN0, cM0, G3, ER+, PR+, HER2-) - Signed by Rachel Moulds, MD on 06/17/2023 Stage  prefix:  Initial diagnosis Method of lymph node assessment: Clinical Histologic grading system: 3 grade system    Discussed the use of AI scribe software for clinical note transcription with the patient, who gave verbal consent to proceed.  History of Present Illness    The patient, with breast cancer and marginal zone lymphoma, presents for follow-up and management. She is undergoing evaluation and treatment for breast cancer, with recent biopsy results indicating HER2 negative status. She has a history of marginal zone lymphoma, previously treated but with a positive margin, requiring continued monitoring. She experiences fluctuating blood pressure, which she attributes to stress and nerves related to her current medical situation. She has communicated with her primary care provider about this issue but has not yet scheduled an office visit. She experiences sleep disturbances, sleeping only a few hours at a time. She mentions having a hernia, which causes occasional discomfort. Rest of the pertinent 10 point ROS reviewed and neg.  MEDICAL HISTORY:  Past Medical History:  Diagnosis Date   GERD (gastroesophageal reflux disease)    Hyperlipidemia    Hypertension    Osteoarthritis     SURGICAL HISTORY: Past Surgical History:  Procedure Laterality Date   ABDOMINAL HYSTERECTOMY  1987   BREAST BIOPSY Left 05/28/2023   [place] LT BREAST BX W LOC DEV EA ADD LESION IMG BX SPEC US GUIDE 05/28/2023 [facility]   BREAST BIOPSY Left 05/28/2023   [place] LT BREAST BX W LOC DEV 1ST LESION IMG BX SPEC US GUIDE 05/28/2023 [facility]   LAPAROTOMY N/A 02/04/2023   Procedure: EXPLORATORY LAPAROTOMY, WITH SMALL BOWEL RESECTION;  Surgeon: Abigail Miyamoto, MD;  Location: [facility];  Service: General;  Laterality: N/A;    SOCIAL HISTORY: Social History   Socioeconomic History   Marital status: Legally Separated    Spouse name: Not on file   Number of children: 3   Years of education: Not on file    Highest education level: Not on file  Occupational History   Occupation: nanny  Tobacco Use   Smoking status: Never   Smokeless tobacco: Never  Vaping Use   Vaping status: Never Used  Substance and Sexual Activity   Alcohol use: No   Drug use: No   Sexual activity: Not Currently  Other Topics Concern   Not on file  Social History Narrative   Not on file   Social Drivers of Health   Financial Resource Strain: Low Risk  (03/22/2022)   Overall Financial Resource Strain (CARDIA)    Difficulty of Paying Living Expenses: Not hard at all  Food Insecurity: Food Insecurity Present (06/14/2023)   Hunger Vital Sign    Worried About Radiation protection practitioner of Food in the Last Year: Sometimes true    Ran Out of Food in the Last Year: Sometimes true  Transportation Needs: No Transportation Needs (06/14/2023)   PRAPARE - Administrator, Civil Service (Medical): No    Lack of Transportation (Non-Medical): No  Physical Activity: Inactive (03/22/2022)   Exercise Vital Sign    Days of Exercise per Week: 0 days    Minutes of Exercise per Session: 0 min  Stress: No Stress Concern Present (03/22/2022)   [employer] of Occupational Health - Occupational Stress Questionnaire    Feeling of Stress : Not at all  Social Connections: Moderately Integrated (03/22/2022)   Social Connection and Isolation Panel [NHANES]    Frequency of Communication with Friends and Family: More than three times a week    Frequency of  Social Gatherings with Friends and Family: More than three times a week    Attends Religious Services: More than 4 times per year    Active Member of [employer] or Organizations: No    Attends Engineer, structural: More than 4 times per year    Marital Status: Separated  Intimate Partner Violence: Not At Risk (06/14/2023)   Humiliation, Afraid, Rape, and Kick questionnaire    Fear of Current or Ex-Partner: No    Emotionally Abused: No    Physically Abused: No    Sexually Abused:  No    FAMILY HISTORY: Family History  Problem Relation Age of Onset   Cancer Father    Hypertension Brother    Diabetes Brother    Diabetes Paternal Grandfather    Diabetes Paternal Uncle    Breast cancer Paternal Aunt    Colon cancer Neg Hx    Hypercalcemia Neg Hx     ALLERGIES:  is allergic to cozaar [losartan potassium], lisinopril, and statins.  MEDICATIONS:  Current Outpatient Medications  Medication Sig Dispense Refill   acetaminophen (TYLENOL) 500 MG tablet Take 2 tablets (1,000 mg total) by mouth 4 (four) times daily. (Patient taking differently: Take 1,000 mg by mouth as needed for mild pain (pain score 1-3), moderate pain (pain score 4-6), fever or headache.) 30 tablet 0   aspirin EC 81 MG tablet Take 81 mg by mouth daily. Swallow whole.     carvedilol (COREG) 6.25 MG tablet Take 1 tablet (6.25 mg total) by mouth 2 (two) times daily with a meal. 180 tablet 1   cetirizine (ZYRTEC) 10 MG tablet Take 10 mg by mouth daily.     Cholecalciferol (VITAMIN D) 50 MCG (2000 UT) CAPS Take 1 capsule by mouth daily.     ezetimibe (ZETIA) 10 MG tablet TAKE 1 TABLET(10 MG) BY MOUTH DAILY 90 tablet 0   famotidine (PEPCID) 40 MG tablet TAKE 1 TABLET(40 MG) BY MOUTH AT BEDTIME 90 tablet 1   Ferric Maltol (ACCRUFER) 30 MG CAPS Take 1 capsule (30 mg total) by mouth in the morning and at bedtime. 180 capsule 0   fluticasone (FLONASE) 50 MCG/ACT nasal spray Place 2 sprays into both nostrils daily. 48 g 1   hydrALAZINE (APRESOLINE) 25 MG tablet TAKE 1 TABLET(25 MG) BY MOUTH THREE TIMES DAILY 270 tablet 1   montelukast (SINGULAIR) 10 MG tablet Take 1 tablet (10 mg total) by mouth at bedtime. 90 tablet 1   pantoprazole (PROTONIX) 20 MG tablet TAKE 1 TABLET(20 MG) BY MOUTH DAILY 90 tablet 1   pravastatin (PRAVACHOL) 40 MG tablet TAKE 1 TABLET(40 MG) BY MOUTH DAILY 90 tablet 1   tiZANidine (ZANAFLEX) 2 MG tablet Take 1 tablet (2 mg total) by mouth every 8 (eight) hours as needed for muscle spasms.  270 tablet 0   traMADol (ULTRAM) 50 MG tablet Take 1 tablet (50 mg total) by mouth every 6 (six) hours as needed. 270 tablet 0   triamcinolone cream (KENALOG) 0.5 % Apply 1 Application topically as needed.     No current facility-administered medications for this visit.      PHYSICAL EXAMINATION: ECOG PERFORMANCE STATUS: 2 - Symptomatic, <50% confined to bed  Vitals:   06/17/23 0838  BP: (!) 163/64  Pulse: 86  Resp: 16  Temp: (!) 97.2 F (36.2 C)  SpO2: 100%    Filed Weights   06/17/23 0838  Weight: 196 lb 14.4 oz (89.3 kg)    GENERAL:alert, no distress and comfortable No  other concerns today  LABORATORY DATA:  I have reviewed the data as listed Lab Results  Component Value Date   WBC 4.3 04/18/2023   HGB 12.4 04/18/2023   HCT 38.6 04/18/2023   MCV 94.4 04/18/2023   PLT 339 04/18/2023     Chemistry      Component Value Date/Time   NA 142 04/18/2023 1039   NA 145 (H) 07/29/2018 1112   K 3.8 04/18/2023 1039   CL 109 04/18/2023 1039   CO2 27 04/18/2023 1039   BUN 10 04/18/2023 1039   BUN 8 07/29/2018 1112   CREATININE 0.85 04/18/2023 1039   CREATININE 0.87 08/03/2015 0942      Component Value Date/Time   CALCIUM 10.7 (H) 04/18/2023 1039   ALKPHOS 100 04/18/2023 1039   AST 11 (L) 04/18/2023 1039   ALT 7 04/18/2023 1039   BILITOT 0.3 04/18/2023 1039       RADIOGRAPHIC STUDIES: I have personally reviewed the radiological images as listed and agreed with the findings in the report. MM DIAG BREAST TOMO UNI LEFT Addendum Date: 05/31/2023 ADDENDUM REPORT: 05/31/2023 07:52 ADDENDUM: This is an addendum to diagnostic exam dated 05/24/2023. IMPRESSION: 2 adjacent suspicious masses in the 10 o'clock region of the LEFT (diagnostic report stated right) breast 19 cm from the nipple. Recommendation: Ultrasound-guided core biopsy of the 2 adjacent masses in the 10 o'clock region of the LEFT (diagnostic report stated left) breast is recommended. Electronically Signed    By: Baird Lyons M.D.   On: 05/31/2023 07:52   Result Date: 05/31/2023 CLINICAL DATA:  68 year old female with known lymphoma. Recent PET scan demonstrated 2 soft tissue nodules in the medial aspect of the left breast. EXAM: DIGITAL DIAGNOSTIC UNILATERAL LEFT MAMMOGRAM WITH TOMOSYNTHESIS AND CAD; ULTRASOUND LEFT BREAST LIMITED TECHNIQUE: Left digital diagnostic mammography and breast tomosynthesis was performed. The images were evaluated with computer-aided detection. ; Targeted ultrasound examination of the left breast was performed. COMPARISON:  Previous exam(s) including NM PET scan dated 05/06/2023. ACR Breast Density Category b: There are scattered areas of fibroglandular density. FINDINGS: No suspicious mass, malignant type microcalcifications or distortion detected in the left breast. Targeted ultrasound is performed, showing 2 adjacent irregular hypoechoic masses with angular margins in the left breast at 10 o'clock 19 cm from the nipple measuring 1 x 0.5 x 0.7 cm and 6 x 6 x 7 mm. IMPRESSION: Two adjacent suspicious masses in the 10 o'clock region of the right breast 19 cm from the nipple. RECOMMENDATION: Ultrasound-guided core biopsy of the 2 adjacent masses in the 10 o'clock region of the right breast 19 cm from the nipple is recommended. The masses are adjacent to each other can be biopsied together. I have discussed the findings and recommendations with the patient. If applicable, a reminder letter will be sent to the patient regarding the next appointment. BI-RADS CATEGORY  4: Suspicious. Electronically Signed: By: Baird Lyons M.D. On: 05/24/2023 14:41   [place] LIMITED ULTRASOUND INCLUDING AXILLA LEFT BREAST  Addendum Date: 05/31/2023 ADDENDUM REPORT: 05/31/2023 07:52 ADDENDUM: This is an addendum to diagnostic exam dated 05/24/2023. IMPRESSION: 2 adjacent suspicious masses in the 10 o'clock region of the LEFT (diagnostic report stated right) breast 19 cm from the nipple. Recommendation:  Ultrasound-guided core biopsy of the 2 adjacent masses in the 10 o'clock region of the LEFT (diagnostic report stated left) breast is recommended. Electronically Signed   By: Baird Lyons M.D.   On: 05/31/2023 07:52   Result Date: 05/31/2023 CLINICAL DATA:  68 year old female with known lymphoma. Recent PET scan demonstrated 2 soft tissue nodules in the medial aspect of the left breast. EXAM: DIGITAL DIAGNOSTIC UNILATERAL LEFT MAMMOGRAM WITH TOMOSYNTHESIS AND CAD; ULTRASOUND LEFT BREAST LIMITED TECHNIQUE: Left digital diagnostic mammography and breast tomosynthesis was performed. The images were evaluated with computer-aided detection. ; Targeted ultrasound examination of the left breast was performed. COMPARISON:  Previous exam(s) including NM PET scan dated 05/06/2023. ACR Breast Density Category b: There are scattered areas of fibroglandular density. FINDINGS: No suspicious mass, malignant type microcalcifications or distortion detected in the left breast. Targeted ultrasound is performed, showing 2 adjacent irregular hypoechoic masses with angular margins in the left breast at 10 o'clock 19 cm from the nipple measuring 1 x 0.5 x 0.7 cm and 6 x 6 x 7 mm. IMPRESSION: Two adjacent suspicious masses in the 10 o'clock region of the right breast 19 cm from the nipple. RECOMMENDATION: Ultrasound-guided core biopsy of the 2 adjacent masses in the 10 o'clock region of the right breast 19 cm from the nipple is recommended. The masses are adjacent to each other can be biopsied together. I have discussed the findings and recommendations with the patient. If applicable, a reminder letter will be sent to the patient regarding the next appointment. BI-RADS CATEGORY  4: Suspicious. Electronically Signed: By: Baird Lyons M.D. On: 05/24/2023 14:41   [place] LT BREAST BX W LOC DEV 1ST LESION IMG BX SPEC US GUIDE Addendum Date: 05/29/2023 ADDENDUM REPORT: 05/29/2023 14:03 ADDENDUM: Pathology revealed: GRADE III INVASIVE MAMMARY  CARCINOMA of the LEFT breast, superficial mass in the UPPER INNER QUADRANT, (ribbon clip). This was found to be concordant by Dr. Quincy Carnes. Pathology revealed: GRADE III INVASIVE MAMMARY CARCINOMA of the LEFT breast, deeper mass in the UPPER INNER QUADRANT, (coil clip). This was found to be concordant by Dr. Quincy Carnes. Pathology results were discussed with the patient by telephone. The patient reported doing well after the biopsies with tenderness at the sites. Post biopsy instructions and care were reviewed and questions were answered. The patient was encouraged to call [facility] for any additional concerns. My direct phone number was provided. Dr. Carman Ching and Dr. Rachel Moulds were notified of biopsy results via secure EPIC message on May 29, 2023. Medical Oncology consultation has been arranged with Dr. Rachel Moulds at [facility] on May 31, 2023. Surgical consultation request was sent to [facility] for Dr. Carman Ching via secure EPIC message on May 29, 2023. Note: Due to the far medial location of these adjacent masses, localization prior to excision will need to be performed using ultrasound guidance. Pathology results reported by Rene Kocher, RN on 05/29/2023. Electronically Signed   By: Hulan Saas M.D.   On: 05/29/2023 14:03   Result Date: 05/29/2023 CLINICAL DATA:  68 year old with a recent diagnosis of marginal zone lymphoma, shown on a PET-CT 05/06/2023 to have 2 adjacent FDG avid masses in the inner LEFT breast. Ultrasound confirmed 2 adjacent masses at 10 o'clock 19 cm from the nipple, the more superficial mass measuring approximately 1.1 cm and the deeper mass measuring approximately 1.4 cm, spanning 2.5 cm in total. These masses were inconspicuous on mammography to their far medial location. EXAM: ULTRASOUND GUIDED LEFT BREAST CORE NEEDLE BIOPSY x 2 COMPARISON:  Previous exam(s). PROCEDURE: I met with  the patient and we discussed the procedure of ultrasound-guided biopsy, including benefits and alternatives. We discussed the high likelihood of a successful procedure. We  discussed the risks of the procedure, including infection, bleeding, tissue injury, clip migration, and inadequate sampling. Informed written consent was given. The usual time-out protocol was performed immediately prior to the procedure. #1) Superficial mass, lesion quadrant: UPPER INNER QUADRANT. Using sterile technique with chlorhexidine as skin antisepsis, 1% Lidocaine as local anesthetic, under direct ultrasound visualization, a 12 gauge Bard Marquee core needle device placed through an 11 gauge introducer needle was used to perform biopsy of the more superficial mass in the UPPER INNER QUADRANT using a lateral approach. At the conclusion of the procedure, a ribbon shaped tissue marker clip was deployed into the biopsy cavity. #2) Deep mass, lesion quadrant: UPPER INNER QUADRANT. Using sterile technique with chlorhexidine as skin antisepsis, 1% lidocaine as local anesthetic, under direct ultrasound visualization, using the same dermatotomy site, a 12 gauge Bard Marquee core needle device placed through an 11 gauge introducer needle used biopsy of the deeper mass in the UPPER INNER QUADRANT using a lateral approach. At the conclusion of the procedure, a coil shaped tissue marker clip was deployed into the biopsy cavity. The patient tolerated the procedures well without apparent immediate complications. Follow up 2 view mammogram was performed in order to confirm clip placement and was dictated separately. IMPRESSION: Ultrasound guided core needle biopsy (x2) of adjacent masses in the UPPER INNER QUADRANT of the LEFT breast adjacent to sternum. Electronically Signed: By: Hulan Saas M.D. On: 05/28/2023 09:05   [place] LT BREAST BX W LOC DEV EA ADD LESION IMG BX SPEC US GUIDE Addendum Date: 05/29/2023 ADDENDUM REPORT: 05/29/2023 14:03  ADDENDUM: Pathology revealed: GRADE III INVASIVE MAMMARY CARCINOMA of the LEFT breast, superficial mass in the UPPER INNER QUADRANT, (ribbon clip). This was found to be concordant by Dr. Quincy Carnes. Pathology revealed: GRADE III INVASIVE MAMMARY CARCINOMA of the LEFT breast, deeper mass in the UPPER INNER QUADRANT, (coil clip). This was found to be concordant by Dr. Quincy Carnes. Pathology results were discussed with the patient by telephone. The patient reported doing well after the biopsies with tenderness at the sites. Post biopsy instructions and care were reviewed and questions were answered. The patient was encouraged to call [facility] for any additional concerns. My direct phone number was provided. Dr. Carman Ching and Dr. Rachel Moulds were notified of biopsy results via secure EPIC message on May 29, 2023. Medical Oncology consultation has been arranged with Dr. Rachel Moulds at [facility] on May 31, 2023. Surgical consultation request was sent to [facility] for Dr. Carman Ching via secure EPIC message on May 29, 2023. Note: Due to the far medial location of these adjacent masses, localization prior to excision will need to be performed using ultrasound guidance. Pathology results reported by Rene Kocher, RN on 05/29/2023. Electronically Signed   By: Hulan Saas M.D.   On: 05/29/2023 14:03   Result Date: 05/29/2023 CLINICAL DATA:  68 year old with a recent diagnosis of marginal zone lymphoma, shown on a PET-CT 05/06/2023 to have 2 adjacent FDG avid masses in the inner LEFT breast. Ultrasound confirmed 2 adjacent masses at 10 o'clock 19 cm from the nipple, the more superficial mass measuring approximately 1.1 cm and the deeper mass measuring approximately 1.4 cm, spanning 2.5 cm in total. These masses were inconspicuous on mammography to their far medial location. EXAM: ULTRASOUND GUIDED LEFT BREAST CORE NEEDLE BIOPSY x  2 COMPARISON:  Previous exam(s). PROCEDURE: I met with the patient and we discussed the procedure of ultrasound-guided  biopsy, including benefits and alternatives. We discussed the high likelihood of a successful procedure. We discussed the risks of the procedure, including infection, bleeding, tissue injury, clip migration, and inadequate sampling. Informed written consent was given. The usual time-out protocol was performed immediately prior to the procedure. #1) Superficial mass, lesion quadrant: UPPER INNER QUADRANT. Using sterile technique with chlorhexidine as skin antisepsis, 1% Lidocaine as local anesthetic, under direct ultrasound visualization, a 12 gauge Bard Marquee core needle device placed through an 11 gauge introducer needle was used to perform biopsy of the more superficial mass in the UPPER INNER QUADRANT using a lateral approach. At the conclusion of the procedure, a ribbon shaped tissue marker clip was deployed into the biopsy cavity. #2) Deep mass, lesion quadrant: UPPER INNER QUADRANT. Using sterile technique with chlorhexidine as skin antisepsis, 1% lidocaine as local anesthetic, under direct ultrasound visualization, using the same dermatotomy site, a 12 gauge Bard Marquee core needle device placed through an 11 gauge introducer needle used biopsy of the deeper mass in the UPPER INNER QUADRANT using a lateral approach. At the conclusion of the procedure, a coil shaped tissue marker clip was deployed into the biopsy cavity. The patient tolerated the procedures well without apparent immediate complications. Follow up 2 view mammogram was performed in order to confirm clip placement and was dictated separately. IMPRESSION: Ultrasound guided core needle biopsy (x2) of adjacent masses in the UPPER INNER QUADRANT of the LEFT breast adjacent to sternum. Electronically Signed: By: Hulan Saas M.D. On: 05/28/2023 09:05   MM CLIP PLACEMENT LEFT Result Date: 05/28/2023 CLINICAL DATA:   Confirmation of clip placement after ultrasound-guided core needle biopsy of 2 adjacent masses in the UPPER INNER QUADRANT of the LEFT breast near the sternum EXAM: 2D and 3D DIAGNOSTIC LEFT MAMMOGRAM POST ULTRASOUND BIOPSY COMPARISON:  Previous exam(s). FINDINGS: 2D and 3D full field CC, mediolateral, and lateral-medial mammographic images were obtained following ultrasound guided biopsy of 2 adjacent masses in the UPPER INNER QUADRANT of the LEFT breast near the sternum. The clips are not visible on the mammogram due to the far inner location. Expected post biopsy changes are present in the visualized breast without evidence of hematoma. IMPRESSION: The ribbon shaped and coil shaped tissue marking clips placed at the time of biopsy are not visible on the mammogram due to their far inner location. If localization is ultimately necessary, ultrasound guidance can be used. Final Assessment: Post Procedure Mammograms for Marker Placement Electronically Signed   By: Hulan Saas M.D.   On: 05/28/2023 09:05    All questions were answered. The patient knows to call the clinic with any problems, questions or concerns.     Rachel Moulds, MD 06/17/2023 8:57 AM

## 2023-06-17 NOTE — Therapy (Signed)
 OUTPATIENT PHYSICAL THERAPY BREAST CANCER BASELINE EVALUATION   Patient Name: Kayla Lowe  MRN: [id number] DOB:1955-12-15, 68 y.o., female Today's Date: 06/18/2023  END OF SESSION:  PT End of Session - 06/18/23 0846     Visit Number 1    Number of Visits 2    Date for PT Re-Evaluation 07/30/23    Authorization Type needed    PT Start Time 0800    PT Stop Time 0845    PT Time Calculation (min) 45 min    Activity Tolerance Patient tolerated treatment well    Behavior During Therapy [facility] for tasks assessed/performed             Past Medical History:  Diagnosis Date   GERD (gastroesophageal reflux disease)    Hyperlipidemia    Hypertension    Osteoarthritis    Past Surgical History:  Procedure Laterality Date   ABDOMINAL HYSTERECTOMY  1987   BREAST BIOPSY Left 05/28/2023   US  LT BREAST BX W LOC DEV EA ADD LESION IMG BX SPEC US  GUIDE 05/28/2023 [facility]   BREAST BIOPSY Left 05/28/2023   US  LT BREAST BX W LOC DEV 1ST LESION IMG BX SPEC US  GUIDE 05/28/2023 [facility]   LAPAROTOMY N/A 02/04/2023   Procedure: EXPLORATORY LAPAROTOMY, WITH SMALL BOWEL RESECTION;  Surgeon: Vernetta Berg, MD;  Location: [facility];  Service: General;  Laterality: N/A;   Patient Active Problem List   Diagnosis Date Noted   Malignant neoplasm of upper-inner quadrant of left breast in female, estrogen receptor positive (HCC) 06/14/2023   Iron deficiency anemia due to chronic blood loss 03/13/2023   Nocturnal muscle cramps 10/17/2022   LVH (left ventricular hypertrophy) due to hypertensive disease, without heart failure 01/17/2022   Encounter for general adult medical examination with abnormal findings 01/09/2021   Visit for screening mammogram 07/12/2020   Primary osteoarthritis of both knees 07/11/2020   Intrinsic eczema 03/14/2020   Mild persistent asthma without complication 03/14/2020   Vitamin D  deficiency disease 09/18/2019   Familial hypercholesteremia  09/16/2019   Morton's neuroma of left foot 07/20/2019   Gastroesophageal reflux disease without esophagitis 02/09/2019   Osteopenia of neck of right femur 02/09/2019   Seasonal allergic rhinitis due to pollen 08/12/2017   Elevated rheumatoid factor 02/06/2016   Dyslipidemia, goal LDL below 130 09/10/2013   Essential hypertension, benign 09/10/2013    PCP: Debby Molt, MD  REFERRING PROVIDER: Berg Vernetta, MD  REFERRING DIAG:  Diagnosis  C50.912 (ICD-10-CM) - Invasive ductal carcinoma of breast, left (HCC)    THERAPY DIAG:  Malignant neoplasm of upper-inner quadrant of left breast in female, estrogen receptor positive (HCC)  Abnormal posture  Rationale for Evaluation and Treatment: Rehabilitation  ONSET DATE: 06/14/23  SUBJECTIVE:  SUBJECTIVE STATEMENT: Patient reports she is here today to be seen by her medical team for her newly diagnosed left breast cancer.   PERTINENT HISTORY:  Pt has two small areas in the left breast measuring 1cm and 7mm. ER/PR positive, HER2 negative. Will be having lumpectomy with SLNB on 07/03/23 with oncotyping and radiation. Also new dx of marginal zone lymphoma.   PATIENT GOALS:   reduce lymphedema risk and learn post op HEP.   PAIN:  Are you having pain? No shoulder pain - just my normal back pain  PRECAUTIONS: Active CA   RED FLAGS: None   HAND DOMINANCE: left  WEIGHT BEARING RESTRICTIONS: No  FALLS:  Has patient fallen in last 6 months? 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  COGNITION: Overall cognitive status: Within  functional limits for tasks assessed    POSTURE:  Forward head and rounded shoulders posture  UPPER EXTREMITY AROM/PROM:  A/PROM RIGHT   eval   Shoulder extension 45  Shoulder flexion 145  Shoulder abduction 150  Shoulder internal rotation 70  Shoulder external rotation 95    (Blank rows = not tested)  A/PROM LEFT   eval  Shoulder extension 58  Shoulder flexion 140  Shoulder abduction 150  Shoulder internal rotation 70  Shoulder external rotation 95    (Blank rows = not tested)  CERVICAL AROM: All within normal limits:   UPPER EXTREMITY STRENGTH: 4+/5 bil   LYMPHEDEMA ASSESSMENTS (in cm):   LANDMARK RIGHT   eval  10 cm proximal to olecranon process 34  Olecranon process 29.4  10 cm proximal to ulnar styloid process 24.3  Just proximal to ulnar styloid process 18.6  Across hand at thumb web space 20.7  At base of 2nd digit 7.2  (Blank rows = not tested)  LANDMARK LEFT   eval  10 cm proximal to olecranon process 33.5  Olecranon process 29.3  10 cm proximal to ulnar styloid process 24.6  Just proximal to ulnar styloid process 18.2  Across hand at thumb web space 20.8  At base of 2nd digit 7.0  (Blank rows = not tested)  L-DEX LYMPHEDEMA SCREENING: The patient was assessed using the L-Dex machine today to produce a lymphedema index baseline score. The patient will be reassessed on a regular basis (typically every 3 months) to obtain new L-Dex scores. If the score is > 6.5 points away from his/her baseline score indicating onset of subclinical lymphedema, it will be recommended to wear a compression garment for 4 weeks, 12 hours per day and then be reassessed. If the score continues to be > 6.5 points from baseline at reassessment, we will initiate lymphedema treatment. Assessing in this manner has a 95% rate of preventing clinically significant lymphedema.  PATIENT EDUCATION:  Education details: Lymphedema risk reduction and post op shoulder/posture HEP Person  educated: Patient Education method: Explanation, Demonstration, Handout Education comprehension: Patient verbalized understanding and returned demonstration  HOME EXERCISE PROGRAM: Patient was instructed today in a home exercise program today for post op shoulder range of motion. These included active assist shoulder flexion in sitting, scapular retraction, wall walking with shoulder abduction, and hands behind head external rotation.  She was encouraged to do these twice a day, holding 3 seconds and repeating 5 times when permitted by her physician.  ASSESSMENT:  CLINICAL IMPRESSION: Pt will benefit from a post op PT reassessment to determine needs and from L-Dex screens every 3 months for 2 years to detect subclinical lymphedema.  Pt will benefit from skilled therapeutic intervention to improve on the following deficits: Decreased knowledge of precautions, impaired UE functional use, pain, decreased ROM, postural dysfunction.   PT treatment/interventions: ADL/self-care home management, pt/family education, therapeutic exercise  REHAB POTENTIAL: Excellent  CLINICAL DECISION MAKING: Stable/uncomplicated  EVALUATION COMPLEXITY: Low   GOALS: Goals reviewed with patient? YES  LONG TERM GOALS: (STG=LTG)    Name Target Date Goal status  1 Pt will be able to verbalize understanding of pertinent lymphedema risk reduction practices relevant to her dx specifically related to skin care.  Baseline:  No knowledge 06/18/2023 Achieved at eval  2 Pt will be able to return demo and/or verbalize understanding of the post op HEP related to regaining shoulder ROM. Baseline:  No knowledge 06/18/2023 Achieved at eval  3 Pt will be able to verbalize understanding of the importance of attending the post op After Breast CA Class for further lymphedema risk reduction education and therapeutic exercise.  Baseline:  No knowledge 06/18/2023 Achieved at eval  4 Pt will demo she has regained full shoulder ROM and  function post operatively compared to baselines.  Baseline: See objective measurements taken today. 07/24/23     PLAN:  PT FREQUENCY/DURATION: EVAL and 1 follow up appointment.   PLAN FOR NEXT SESSION: will reassess 3-4 weeks post op to determine needs.   Patient will follow up at outpatient cancer rehab 3-4 weeks following surgery.  If the patient requires physical therapy at that time, a specific plan will be dictated and sent to the referring physician for approval. The patient was educated today on appropriate basic range of motion exercises to begin post operatively and the importance of attending the After Breast Cancer class following surgery.  Patient was educated today on lymphedema risk reduction practices as it pertains to recommendations that will benefit the patient immediately following surgery.  She verbalized good understanding.    Physical Therapy Information for After Breast Cancer Surgery/Treatment:  Lymphedema is a swelling condition that you may be at risk for in your arm if you have lymph nodes removed from the armpit area.  After a sentinel node biopsy, the risk is approximately 5-9% and is higher after an axillary node dissection.  There is treatment available for this condition and it is not life-threatening.  Contact your physician or physical therapist with concerns. You may begin the 4 shoulder/posture exercises (see additional sheet) when permitted by your physician (typically a week after surgery).  If you have drains, you may need to wait until those are removed before beginning range of motion exercises.  A general recommendation is to not lift your arms above shoulder height until drains are removed.  These exercises should be done to your tolerance and gently.  This is not a no pain/no gain type of recovery so listen to your body and stretch into the range of motion that you can tolerate, stopping if you have pain.  If you are having immediate reconstruction, ask  your plastic surgeon about doing exercises as he or she may want you to wait. We encourage you to attend the free one time ABC (After Breast Cancer) class offered by [facility].  You will learn information related to lymphedema risk, prevention and treatment and additional exercises to regain mobility following surgery.  You can  call [phone] for more information.  This is offered the 1st and 3rd Monday of each month.  You only attend the class one time. While undergoing any medical procedure or treatment, try to avoid blood pressure being taken or needle sticks from occurring on the arm on the side of cancer.   This recommendation begins after surgery and continues for the rest of your life.  This may help reduce your risk of getting lymphedema (swelling in your arm). An excellent resource for those seeking information on lymphedema is the National Lymphedema Network's web site. It can be accessed at www.lymphnet.org If you notice swelling in your hand, arm or breast at any time following surgery (even if it is many years from now), please contact your doctor or physical therapist to discuss this.  Lymphedema can be treated at any time but it is easier for you if it is treated early on.  If you feel like your shoulder motion is not returning to normal in a reasonable amount of time, please contact your surgeon or physical therapist.  [facility] [phone]. [address], [address], [place] [address]  ABC CLASS After Breast Cancer Class  After Breast Cancer Class is a specially designed exercise class to assist you in a safe recover after having breast cancer surgery.  In this class you will learn how to get back to full function whether your drains were just removed or if you had surgery a month ago.  This one-time class is held the 1st and 3rd Monday of every month from 11:00 a.m. until 12:00 noon virtually.  This class is FREE and  space is limited. For more information or to register for the next available class, call [phone].  Class Goals  Understand specific stretches to improve the flexibility of you chest and shoulder. Learn ways to safely strengthen your upper body and improve your posture. Understand the warning signs of infection and why you may be at risk for an arm infection. Learn about Lymphedema and prevention.  ** You do not attend this class until after surgery.  Drains must be removed to participate  Patient was instructed today in a home exercise program today for post op shoulder range of motion. These included active assist shoulder flexion in sitting, scapular retraction, wall walking with shoulder abduction, and hands behind head external rotation.  She was encouraged to do these twice a day, holding 3 seconds and repeating 5 times when permitted by her physician.    Larue Saddie SAUNDERS, PT 06/18/2023, 8:47 AM

## 2023-06-18 ENCOUNTER — Encounter: Payer: Self-pay | Admitting: *Deleted

## 2023-06-18 ENCOUNTER — Encounter: Payer: Self-pay | Admitting: Rehabilitation

## 2023-06-18 ENCOUNTER — Ambulatory Visit: Payer: Medicare Other | Attending: Surgery | Admitting: Rehabilitation

## 2023-06-18 DIAGNOSIS — C50212 Malignant neoplasm of upper-inner quadrant of left female breast: Secondary | ICD-10-CM | POA: Diagnosis not present

## 2023-06-18 DIAGNOSIS — R293 Abnormal posture: Secondary | ICD-10-CM | POA: Diagnosis not present

## 2023-06-18 DIAGNOSIS — Z170 Estrogen receptor positive status [ER+]: Secondary | ICD-10-CM | POA: Diagnosis not present

## 2023-06-19 ENCOUNTER — Encounter: Payer: Self-pay | Admitting: Internal Medicine

## 2023-06-19 ENCOUNTER — Telehealth: Payer: Self-pay | Admitting: Hematology and Oncology

## 2023-06-19 ENCOUNTER — Ambulatory Visit (INDEPENDENT_AMBULATORY_CARE_PROVIDER_SITE_OTHER): Payer: Medicare Other | Admitting: Internal Medicine

## 2023-06-19 VITALS — BP 174/96 | HR 77 | Temp 98.3°F | Resp 16 | Ht 63.5 in | Wt 196.5 lb

## 2023-06-19 DIAGNOSIS — I119 Hypertensive heart disease without heart failure: Secondary | ICD-10-CM | POA: Diagnosis not present

## 2023-06-19 DIAGNOSIS — I1A0 Resistant hypertension: Secondary | ICD-10-CM

## 2023-06-19 MED ORDER — CARVEDILOL 6.25 MG PO TABS: 6.2500 mg | ORAL_TABLET | Freq: Two times a day (BID) | ORAL | 0 refills | Status: DC

## 2023-06-19 MED ORDER — HYDRALAZINE HCL 25 MG PO TABS: ORAL_TABLET | ORAL | 0 refills | Status: DC

## 2023-06-19 MED ORDER — TRIAMTERENE-HCTZ 37.5-25 MG PO CAPS: 1.0000 | ORAL_CAPSULE | Freq: Every day | ORAL | 0 refills | Status: DC

## 2023-06-19 NOTE — Telephone Encounter (Signed)
 Rescheduled patient's appointment per in basket message received. Patient was acceptable to changes that were made.

## 2023-06-19 NOTE — Patient Instructions (Signed)
 Hypertension, Adult High blood pressure (hypertension) is when the force of blood pumping through the arteries is too strong. The arteries are the blood vessels that carry blood from the heart throughout the body. Hypertension forces the heart to work harder to pump blood and may cause arteries to become narrow or stiff. Untreated or uncontrolled hypertension can lead to a heart attack, heart failure, a stroke, kidney disease, and other problems. A blood pressure reading consists of a higher number over a lower number. Ideally, your blood pressure should be below 120/80. The first ("top") number is called the systolic pressure. It is a measure of the pressure in your arteries as your heart beats. The second ("bottom") number is called the diastolic pressure. It is a measure of the pressure in your arteries as the heart relaxes. What are the causes? The exact cause of this condition is not known. There are some conditions that result in high blood pressure. What increases the risk? Certain factors may make you more likely to develop high blood pressure. Some of these risk factors are under your control, including: Smoking. Not getting enough exercise or physical activity. Being overweight. Having too much fat, sugar, calories, or salt (sodium) in your diet. Drinking too much alcohol. Other risk factors include: Having a personal history of heart disease, diabetes, high cholesterol, or kidney disease. Stress. Having a family history of high blood pressure and high cholesterol. Having obstructive sleep apnea. Age. The risk increases with age. What are the signs or symptoms? High blood pressure may not cause symptoms. Very high blood pressure (hypertensive crisis) may cause: Headache. Fast or irregular heartbeats (palpitations). Shortness of breath. Nosebleed. Nausea and vomiting. Vision changes. Severe chest pain, dizziness, and seizures. How is this diagnosed? This condition is diagnosed by  measuring your blood pressure while you are seated, with your arm resting on a flat surface, your legs uncrossed, and your feet flat on the floor. The cuff of the blood pressure monitor will be placed directly against the skin of your upper arm at the level of your heart. Blood pressure should be measured at least twice using the same arm. Certain conditions can cause a difference in blood pressure between your right and left arms. If you have a high blood pressure reading during one visit or you have normal blood pressure with other risk factors, you may be asked to: Return on a different day to have your blood pressure checked again. Monitor your blood pressure at home for 1 week or longer. If you are diagnosed with hypertension, you may have other blood or imaging tests to help your health care provider understand your overall risk for other conditions. How is this treated? This condition is treated by making healthy lifestyle changes, such as eating healthy foods, exercising more, and reducing your alcohol intake. You may be referred for counseling on a healthy diet and physical activity. Your health care provider may prescribe medicine if lifestyle changes are not enough to get your blood pressure under control and if: Your systolic blood pressure is above 130. Your diastolic blood pressure is above 80. Your personal target blood pressure may vary depending on your medical conditions, your age, and other factors. Follow these instructions at home: Eating and drinking  Eat a diet that is high in fiber and potassium, and low in sodium, added sugar, and fat. An example of this eating plan is called the DASH diet. DASH stands for Dietary Approaches to Stop Hypertension. To eat this way: Eat  plenty of fresh fruits and vegetables. Try to fill one half of your plate at each meal with fruits and vegetables. Eat whole grains, such as whole-wheat pasta, brown rice, or whole-grain bread. Fill about one  fourth of your plate with whole grains. Eat or drink low-fat dairy products, such as skim milk or low-fat yogurt. Avoid fatty cuts of meat, processed or cured meats, and poultry with skin. Fill about one fourth of your plate with lean proteins, such as fish, chicken without skin, beans, eggs, or tofu. Avoid pre-made and processed foods. These tend to be higher in sodium, added sugar, and fat. Reduce your daily sodium intake. Many people with hypertension should eat less than 1,500 mg of sodium a day. Do not drink alcohol if: Your health care provider tells you not to drink. You are pregnant, may be pregnant, or are planning to become pregnant. If you drink alcohol: Limit how much you have to: 0-1 drink a day for women. 0-2 drinks a day for men. Know how much alcohol is in your drink. In the [place] one drink equals one 12 oz bottle of beer (355 mL), one 5 oz glass of wine (148 mL), or one 1 oz glass of hard liquor (44 mL). Lifestyle  Work with your health care provider to maintain a healthy body weight or to lose weight. Ask what an ideal weight is for you. Get at least 30 minutes of exercise that causes your heart to beat faster (aerobic exercise) most days of the week. Activities may include walking, swimming, or biking. Include exercise to strengthen your muscles (resistance exercise), such as Pilates or lifting weights, as part of your weekly exercise routine. Try to do these types of exercises for 30 minutes at least 3 days a week. Do not use any products that contain nicotine or tobacco. These products include cigarettes, chewing tobacco, and vaping devices, such as e-cigarettes. If you need help quitting, ask your health care provider. Monitor your blood pressure at home as told by your health care provider. Keep all follow-up visits. This is important. Medicines Take over-the-counter and prescription medicines only as told by your health care provider. Follow directions carefully. Blood  pressure medicines must be taken as prescribed. Do not skip doses of blood pressure medicine. Doing this puts you at risk for problems and can make the medicine less effective. Ask your health care provider about side effects or reactions to medicines that you should watch for. Contact a health care provider if you: Think you are having a reaction to a medicine you are taking. Have headaches that keep coming back (recurring). Feel dizzy. Have swelling in your ankles. Have trouble with your vision. Get help right away if you: Develop a severe headache or confusion. Have unusual weakness or numbness. Feel faint. Have severe pain in your chest or abdomen. Vomit repeatedly. Have trouble breathing. These symptoms may be an emergency. Get help right away. Call 911. Do not wait to see if the symptoms will go away. Do not drive yourself to the hospital. Summary Hypertension is when the force of blood pumping through your arteries is too strong. If this condition is not controlled, it may put you at risk for serious complications. Your personal target blood pressure may vary depending on your medical conditions, your age, and other factors. For most people, a normal blood pressure is less than 120/80. Hypertension is treated with lifestyle changes, medicines, or a combination of both. Lifestyle changes include losing weight, eating a healthy,  low-sodium diet, exercising more, and limiting alcohol. This information is not intended to replace advice given to you by your health care provider. Make sure you discuss any questions you have with your health care provider. Document Revised: 03/07/2021 Document Reviewed: 03/07/2021 Elsevier Patient Education  2024 [employer].

## 2023-06-19 NOTE — Progress Notes (Signed)
 Subjective:  Patient ID: Kayla Lowe , female    DOB: Oct 11, 1955  Age: 68 y.o. MRN: [id number]  CC: Hypertension   HPI Kayla Lowe  presents for f/up ---  Discussed the use of AI scribe software for clinical note transcription with the patient, who gave verbal consent to proceed.  History of Present Illness   Kayla Lowe  is a 68 year old female with hypertension who presents with elevated blood pressure and stress.  She has been experiencing elevated blood pressure, which she attributes to stress. She had a headache a couple of days ago. No blurred vision, chest pain, or shortness of breath. Her blood pressure has been high recently, and she is due for medication refills.  according to RX refills she has run out of carvedilol  and hydralazine , but she says she has a few pills left. She was previously on amlodipine , which was discontinued due to low blood pressure. She has not taken a diuretic before.       Outpatient Medications Prior to Visit  Medication Sig Dispense Refill   acetaminophen  (TYLENOL ) 500 MG tablet Take 2 tablets (1,000 mg total) by mouth 4 (four) times daily. (Patient taking differently: Take 1,000 mg by mouth as needed for mild pain (pain score 1-3), moderate pain (pain score 4-6), fever or headache.) 30 tablet 0   aspirin  EC 81 MG tablet Take 81 mg by mouth daily. Swallow whole.     cetirizine  (ZYRTEC ) 10 MG tablet Take 10 mg by mouth daily.     Cholecalciferol  (VITAMIN D ) 50 MCG (2000 UT) CAPS Take 1 capsule by mouth daily.     ezetimibe  (ZETIA ) 10 MG tablet TAKE 1 TABLET(10 MG) BY MOUTH DAILY 90 tablet 0   famotidine  (PEPCID ) 40 MG tablet TAKE 1 TABLET(40 MG) BY MOUTH AT BEDTIME 90 tablet 1   Ferric Maltol  (ACCRUFER ) 30 MG CAPS Take 1 capsule (30 mg total) by mouth in the morning and at bedtime. 180 capsule 0   fluticasone  (FLONASE ) 50 MCG/ACT nasal spray Place 2 sprays into both nostrils daily. 48 g 1   montelukast   (SINGULAIR ) 10 MG tablet Take 1 tablet (10 mg total) by mouth at bedtime. 90 tablet 1   pantoprazole  (PROTONIX ) 20 MG tablet TAKE 1 TABLET(20 MG) BY MOUTH DAILY 90 tablet 1   pravastatin  (PRAVACHOL ) 40 MG tablet TAKE 1 TABLET(40 MG) BY MOUTH DAILY 90 tablet 1   tiZANidine  (ZANAFLEX ) 2 MG tablet Take 1 tablet (2 mg total) by mouth every 8 (eight) hours as needed for muscle spasms. 270 tablet 0   traMADol  (ULTRAM ) 50 MG tablet Take 1 tablet (50 mg total) by mouth every 6 (six) hours as needed. 270 tablet 0   triamcinolone  cream (KENALOG ) 0.5 % Apply 1 Application topically as needed.     carvedilol  (COREG ) 6.25 MG tablet Take 1 tablet (6.25 mg total) by mouth 2 (two) times daily with a meal. 180 tablet 1   hydrALAZINE  (APRESOLINE ) 25 MG tablet TAKE 1 TABLET(25 MG) BY MOUTH THREE TIMES DAILY 270 tablet 1   No facility-administered medications prior to visit.    ROS Review of Systems  Constitutional: Negative.  Negative for chills, diaphoresis, fatigue and fever.  HENT: Negative.    Eyes: Negative.   Respiratory:  Negative for cough, chest tightness, shortness of breath and wheezing.   Cardiovascular:  Negative for chest pain, palpitations and leg swelling.  Gastrointestinal:  Negative for abdominal pain, constipation, diarrhea, nausea and vomiting.  Endocrine: Negative.   Genitourinary: Negative.  Negative  for difficulty urinating, dysuria and hematuria.  Musculoskeletal: Negative.  Negative for arthralgias and myalgias.  Skin: Negative.   Neurological:  Positive for headaches. Negative for dizziness and weakness.  Hematological:  Negative for adenopathy. Does not bruise/bleed easily.  Psychiatric/Behavioral: Negative.      Objective:  BP (!) 174/96 (BP Location: Right Leg, Patient Position: Sitting) Comment (Cuff Size): thigh cuff  Pulse 77   Temp 98.3 F (36.8 C) (Temporal)   Resp 16   Ht 5' 3.5 (1.613 m)   Wt 196 lb 8 oz (89.1 kg)   BMI 34.26 kg/m   BP Readings from Last 3  Encounters:  06/21/23 122/69  06/20/23 (!) 158/83  06/19/23 (!) 174/96    Wt Readings from Last 3 Encounters:  06/21/23 198 lb (89.8 kg)  06/20/23 195 lb (88.5 kg)  06/19/23 196 lb 8 oz (89.1 kg)    Physical Exam Vitals reviewed.  Constitutional:      Appearance: Normal appearance.  HENT:     Mouth/Throat:     Mouth: Mucous membranes are moist.  Eyes:     General: No scleral icterus.    Conjunctiva/sclera: Conjunctivae normal.  Cardiovascular:     Rate and Rhythm: Normal rate and regular rhythm.     Heart sounds: No murmur heard.    No friction rub. No gallop.  Pulmonary:     Effort: Pulmonary effort is normal.     Breath sounds: No stridor. No wheezing, rhonchi or rales.  Abdominal:     General: Abdomen is flat.     Palpations: There is no mass.     Tenderness: There is no abdominal tenderness. There is no guarding.     Hernia: No hernia is present.  Musculoskeletal:        General: Normal range of motion.     Cervical back: Neck supple.     Right lower leg: No edema.     Left lower leg: No edema.  Lymphadenopathy:     Cervical: No cervical adenopathy.  Skin:    General: Skin is warm and dry.  Neurological:     General: No focal deficit present.     Mental Status: She is alert. Mental status is at baseline.  Psychiatric:        Mood and Affect: Mood normal.        Behavior: Behavior normal.     Lab Results  Component Value Date   WBC 4.3 04/18/2023   HGB 12.4 04/18/2023   HCT 38.6 04/18/2023   PLT 339 04/18/2023   GLUCOSE 101 (H) 04/18/2023   CHOL 177 03/13/2023   TRIG 167.0 (H) 03/13/2023   HDL 49.20 03/13/2023   LDLDIRECT 220.0 09/16/2019   LDLCALC 94 03/13/2023   ALT 7 04/18/2023   AST 11 (L) 04/18/2023   NA 142 04/18/2023   K 3.8 04/18/2023   CL 109 04/18/2023   CREATININE 0.85 04/18/2023   BUN 10 04/18/2023   CO2 27 04/18/2023   TSH 1.28 03/13/2023   INR 0.97 07/10/2013   HGBA1C 5.4 08/31/2019    No results found.  Assessment &  Plan:  LVH (left ventricular hypertrophy) due to hypertensive disease, without heart failure- Will restart carvedilol  and hydralazine . -     Carvedilol ; Take 1 tablet (6.25 mg total) by mouth 2 (two) times daily with a meal.  Dispense: 180 tablet; Refill: 0 -     hydrALAZINE  HCl; TAKE 1 TABLET(25 MG) BY MOUTH THREE TIMES DAILY  Dispense: 270 tablet; Refill:  0 -     Triamterene -HCTZ; Take 1 each (1 capsule total) by mouth daily.  Dispense: 90 capsule; Refill: 0  Resistant hypertension- Will add dyazide to get better BP control. -     Carvedilol ; Take 1 tablet (6.25 mg total) by mouth 2 (two) times daily with a meal.  Dispense: 180 tablet; Refill: 0 -     hydrALAZINE  HCl; TAKE 1 TABLET(25 MG) BY MOUTH THREE TIMES DAILY  Dispense: 270 tablet; Refill: 0 -     Triamterene -HCTZ; Take 1 each (1 capsule total) by mouth daily.  Dispense: 90 capsule; Refill: 0 -     AMB Referral [facility] Care Management     Follow-up: Return in about 3 months (around 09/16/2023).  Debby Molt, MD

## 2023-06-20 ENCOUNTER — Ambulatory Visit: Payer: Medicare Other | Admitting: Plastic Surgery

## 2023-06-20 ENCOUNTER — Telehealth: Payer: Self-pay

## 2023-06-20 ENCOUNTER — Encounter: Payer: Self-pay | Admitting: Plastic Surgery

## 2023-06-20 VITALS — BP 158/83 | HR 85 | Ht 63.5 in | Wt 195.0 lb

## 2023-06-20 DIAGNOSIS — Z170 Estrogen receptor positive status [ER+]: Secondary | ICD-10-CM

## 2023-06-20 DIAGNOSIS — C50212 Malignant neoplasm of upper-inner quadrant of left female breast: Secondary | ICD-10-CM

## 2023-06-20 NOTE — Progress Notes (Signed)
 Care Guide Pharmacy Note  06/20/2023 Name: Kayla Lowe  MRN: [id number] DOB: November 26, 1955  Referred By: [name], MD Reason for referral: Care Coordination (Outreach to schedule with Pharm d )   Kayla Lowe  is a 68 y.o. year old female who is a primary care patient of [name], MD.  Kayla Lowe  was referred to the pharmacist for assistance related to: HTN  Successful contact was made with the patient to discuss pharmacy services including being ready for the pharmacist to call at least 5 minutes before the scheduled appointment time and to have medication bottles and any blood pressure readings ready for review. The patient agreed to meet with the pharmacist via telephone visit on (date/time).07/04/2023  Jeoffrey Buffalo , RMA     [place]  [facility], [facility] Guide  Direct Dial: [phone]  Website: [email or web address]

## 2023-06-20 NOTE — Progress Notes (Signed)
 Referring Provider [name], MD [address] [place],  [place] [address]   CC:  Chief Complaint  Patient presents with   Consult      Kayla Lowe  is an 68 y.o. female.  HPI: Kayla Lowe  is a 68 year old female who presents today for evaluation prior to a left breast lumpectomy.  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  Patient also has several other oncologic issues that are currently being addressed.  She also endorses significant upper back and neck pain secondary to large breast size.  Allergies  Allergen Reactions   Cozaar  [Losartan  Potassium]     Caused cough and sweats. Tingling in legs.   Lisinopril  Cough   Statins Other (See Comments)    myalgias    Outpatient Encounter Medications as of 06/20/2023  Medication Sig   acetaminophen  (TYLENOL ) 500 MG tablet Take 2 tablets (1,000 mg total) by mouth 4 (four) times daily. (Patient taking differently: Take 1,000 mg by mouth as needed for mild pain (pain score 1-3), moderate pain (pain score 4-6), fever or headache.)   aspirin  EC 81 MG tablet Take 81 mg by mouth daily. Swallow whole.   carvedilol  (COREG ) 6.25 MG tablet Take 1 tablet (6.25 mg total) by mouth 2 (two) times daily with a meal.   cetirizine  (ZYRTEC ) 10 MG tablet Take 10 mg by mouth daily.   Cholecalciferol  (VITAMIN D ) 50 MCG (2000 UT) CAPS Take 1 capsule by mouth daily.   ezetimibe  (ZETIA ) 10 MG tablet TAKE 1 TABLET(10 MG) BY MOUTH DAILY   famotidine  (PEPCID ) 40 MG tablet TAKE 1 TABLET(40 MG) BY MOUTH AT BEDTIME   Ferric Maltol  (ACCRUFER ) 30 MG CAPS Take 1 capsule (30 mg total) by mouth in the morning and at bedtime.   fluticasone  (FLONASE ) 50 MCG/ACT nasal spray Place 2 sprays into both nostrils daily.   hydrALAZINE  (APRESOLINE ) 25 MG tablet TAKE 1 TABLET(25 MG) BY MOUTH THREE TIMES DAILY   montelukast  (SINGULAIR ) 10 MG tablet Take 1 tablet (10 mg total) by mouth at bedtime.    pantoprazole  (PROTONIX ) 20 MG tablet TAKE 1 TABLET(20 MG) BY MOUTH DAILY   pravastatin  (PRAVACHOL ) 40 MG tablet TAKE 1 TABLET(40 MG) BY MOUTH DAILY   tiZANidine  (ZANAFLEX ) 2 MG tablet Take 1 tablet (2 mg total) by mouth every 8 (eight) hours as needed for muscle spasms.   traMADol  (ULTRAM ) 50 MG tablet Take 1 tablet (50 mg total) by mouth every 6 (six) hours as needed.   triamcinolone  cream (KENALOG ) 0.5 % Apply 1 Application topically as needed.   triamterene -hydrochlorothiazide  (DYAZIDE) 37.5-25 MG capsule Take 1 each (1 capsule total) by mouth daily.   No facility-administered encounter medications on file as of 06/20/2023.     Past Medical History:  Diagnosis Date   GERD (gastroesophageal reflux disease)    Hyperlipidemia    Hypertension    Osteoarthritis     Past Surgical History:  Procedure Laterality Date   ABDOMINAL HYSTERECTOMY  1987   BREAST BIOPSY Left 05/28/2023   US  LT BREAST BX W LOC DEV EA ADD LESION IMG BX SPEC US  GUIDE 05/28/2023 [facility]   BREAST BIOPSY Left 05/28/2023   US  LT BREAST BX W LOC DEV 1ST LESION IMG BX SPEC US  GUIDE 05/28/2023 [facility]   LAPAROTOMY N/A 02/04/2023   Procedure: EXPLORATORY LAPAROTOMY, WITH SMALL BOWEL RESECTION;  Surgeon: Vernetta Berg, MD;  Location: [facility];  Service: General;  Laterality: N/A;  Family History  Problem Relation Age of Onset   Cancer Father    Hypertension Brother    Diabetes Brother    Diabetes Paternal Grandfather    Diabetes Paternal Uncle    Breast cancer Paternal Aunt    Colon cancer Neg Hx    Hypercalcemia Neg Hx     Social History   Social History Narrative   Not on file     Review of Systems General: Denies fevers, chills, weight loss CV: Denies chest pain, shortness of breath, palpitations Breast: Recently diagnosed with breast cancer complaints of upper back and secondary to large breast size  Physical Exam    06/20/2023    8:06 AM 06/19/2023    9:50 AM 06/17/2023    8:38  AM  Vitals with BMI  Height 5' 3.5 5' 3.5   Weight 195 lbs 196 lbs 8 oz 196 lbs 14 oz  BMI 34 34.26 34.33  Systolic 158 174 836  Diastolic 83 96 64  Pulse 85 77 86    General:  No acute distress,  Alert and oriented, Non-Toxic, Normal speech and affect Breast: Patient has very large pendulous breasts with grade 3 ptosis Mammogram: As noted during workup for her breast cancer Assessment/Plan Left breast invasive mammary carcinoma: Kayla Lowe  is scheduled to undergo a lumpectomy with sentinel node biopsy.  We have discussed possible outcomes for surgery including no significant defect due to the large size of her breast.  There is also possible that she will have a significant cosmetic deformity.  If this is the case I believe that an oncoplastic fill this contour defect.  Additionally and will also help with her upper back and neck pain.  We discussed breast reductions including the location of the incisions and nature of scarring.  We discussed the risks of bleeding, infection, seroma formation.  We discussed the use of drains operatively.  Discussed the specific risk of nipple loss due to nipple ischemia.  All questions were answered to her satisfaction and photographs were obtained today with her consent.  I will schedule her for a follow-up appointment February 24 discuss her surgical outcome after the lumpectomy and how she would like.  Leonce KATHEE Birmingham 06/20/2023, 8:36 AM

## 2023-06-20 NOTE — Progress Notes (Signed)
 Care Guide Pharmacy Note  06/20/2023 Name: Kayla Lowe  MRN: [id number] DOB: [birth year]  Referred By: [name], MD Reason for referral: Care Coordination (Outreach to schedule with Pharm d )   Kayla Lowe  is a 68 y.o. year old female who is a primary care patient of [name], MD.  Kayla Lowe  was referred to the pharmacist for assistance related to: HTN  An unsuccessful telephone outreach was attempted today to contact the patient who was referred to the pharmacy team for assistance with medication management. Additional attempts will be made to contact the patient.  Kayla Lowe , RMA     [facility]  [facility], [facility] Guide  Direct Dial: [phone]  Website: [email or web address]

## 2023-06-21 ENCOUNTER — Encounter: Payer: Self-pay | Admitting: Internal Medicine

## 2023-06-21 ENCOUNTER — Ambulatory Visit: Payer: Medicare Other | Admitting: Internal Medicine

## 2023-06-21 VITALS — BP 122/69 | HR 77 | Temp 97.4°F | Resp 13 | Ht 64.0 in | Wt 198.0 lb

## 2023-06-21 DIAGNOSIS — K3189 Other diseases of stomach and duodenum: Secondary | ICD-10-CM

## 2023-06-21 DIAGNOSIS — K219 Gastro-esophageal reflux disease without esophagitis: Secondary | ICD-10-CM

## 2023-06-21 DIAGNOSIS — K2970 Gastritis, unspecified, without bleeding: Secondary | ICD-10-CM | POA: Diagnosis not present

## 2023-06-21 DIAGNOSIS — C884 Extranodal marginal zone b-cell lymphoma of mucosa-associated lymphoid tissue (malt-lymphoma) not having achieved remission: Secondary | ICD-10-CM | POA: Diagnosis not present

## 2023-06-21 DIAGNOSIS — C8580 Other specified types of non-Hodgkin lymphoma, unspecified site: Secondary | ICD-10-CM

## 2023-06-21 DIAGNOSIS — R948 Abnormal results of function studies of other organs and systems: Secondary | ICD-10-CM

## 2023-06-21 DIAGNOSIS — R933 Abnormal findings on diagnostic imaging of other parts of digestive tract: Secondary | ICD-10-CM | POA: Diagnosis not present

## 2023-06-21 DIAGNOSIS — C8309 Small cell B-cell lymphoma, extranodal and solid organ sites: Secondary | ICD-10-CM | POA: Diagnosis not present

## 2023-06-21 MED ORDER — SODIUM CHLORIDE 0.9 % IV SOLN: 500.0000 mL | Freq: Once | INTRAVENOUS | Status: DC

## 2023-06-21 NOTE — Progress Notes (Signed)
 See office note dated 06/13/2023 for details and current H&P  Patient with a history of marginal zone B-cell lymphoma and abnormal PET scan of the descending duodenum  She remains appropriate for upper endoscopy in the [facility] today

## 2023-06-21 NOTE — Progress Notes (Signed)
 Called to room to assist during endoscopic procedure.  Patient ID and intended procedure confirmed with present staff. Received instructions for my participation in the procedure from the performing physician.

## 2023-06-21 NOTE — Patient Instructions (Signed)
 YOU HAD AN ENDOSCOPIC PROCEDURE TODAY AT THE [facility]:   Refer to the procedure report that was given to you for any specific questions about what was found during the examination.  If the procedure report does not answer your questions, please call your gastroenterologist to clarify.  If you requested that your care partner not be given the details of your procedure findings, then the procedure report has been included in a sealed envelope for you to review at your convenience later.  YOU SHOULD EXPECT: Some feelings of bloating in the abdomen. Passage of more gas than usual.  Walking can help get rid of the air that was put into your GI tract during the procedure and reduce the bloating. If you had a lower endoscopy (such as a colonoscopy or flexible sigmoidoscopy) you may notice spotting of blood in your stool or on the toilet paper. If you underwent a bowel prep for your procedure, you may not have a normal bowel movement for a few days.  Please Note:  You might notice some irritation and congestion in your nose or some drainage.  This is from the oxygen used during your procedure.  There is no need for concern and it should clear up in a day or so.  SYMPTOMS TO REPORT IMMEDIATELY:  Following upper endoscopy (EGD)  Vomiting of blood or coffee ground material  New chest pain or pain under the shoulder blades  Painful or persistently difficult swallowing  New shortness of breath  Fever of 100F or higher  Black, tarry-looking stools  For urgent or emergent issues, a gastroenterologist can be reached at any hour by calling [phone]. Do not use MyChart messaging for urgent concerns.    DIET:  We do recommend a small meal at first, but then you may proceed to your regular diet.  Drink plenty of fluids but you should avoid alcoholic beverages for 24 hours.  MEDICATIONS: Continue present medications.  Please see handouts given to you by your recovery nurse:  Gastritis.  FOLLOW UP: Await pathology results.  Thank you for allowing us  to provide for your healthcare needs today.  ACTIVITY:  You should plan to take it easy for the rest of today and you should NOT DRIVE or use heavy machinery until tomorrow (because of the sedation medicines used during the test).    FOLLOW UP: Our staff will call the number listed on your records the next business day following your procedure.  We will call around 7:15- 8:00 am to check on you and address any questions or concerns that you may have regarding the information given to you following your procedure. If we do not reach you, we will leave a message.     If any biopsies were taken you will be contacted by phone or by letter within the next 1-3 weeks.  Please call us  at [phone] if you have not heard about the biopsies in 3 weeks.    SIGNATURES/CONFIDENTIALITY: You and/or your care partner have signed paperwork which will be entered into your electronic medical record.  These signatures attest to the fact that that the information above on your After Visit Summary has been reviewed and is understood.  Full responsibility of the confidentiality of this discharge information lies with you and/or your care-partner.

## 2023-06-21 NOTE — Op Note (Signed)
 [facility] Patient Name: Kayla Lowe  Procedure Date: 06/21/2023 1:26 PM MRN: [id number] Endoscopist: Gordy CHRISTELLA Starch , MD, [id number] Age: 68 Referring MD:  Date of Birth: 12/23/1955 Gender: Female Account #: [id number] Procedure:                Upper GI endoscopy Indications:              Abnormal PET scan of the GI tract (thickening and                            avidity in the descending duodenum), personal                            history MALT lymphoma Medicines:                Monitored Anesthesia Care Procedure:                Pre-Anesthesia Assessment:                           - Prior to the procedure, a History and Physical                            was performed, and patient medications and                            allergies were reviewed. The patient's tolerance of                            previous anesthesia was also reviewed. The risks                            and benefits of the procedure and the sedation                            options and risks were discussed with the patient.                            All questions were answered, and informed consent                            was obtained. Prior Anticoagulants: The patient has                            taken no anticoagulant or antiplatelet agents. ASA                            Grade Assessment: II - A patient with mild systemic                            disease. After reviewing the risks and benefits,                            the patient was deemed in satisfactory condition to  undergo the procedure.                           After obtaining informed consent, the endoscope was                            passed under direct vision. Throughout the                            procedure, the patient's blood pressure, pulse, and                            oxygen saturations were monitored continuously. The                            [id number]-[id number] Slim SN  [id number] was introduced through                            the mouth, and advanced to the fourth part of                            duodenum. The upper GI endoscopy was accomplished                            without difficulty. The patient tolerated the                            procedure well. Scope In: Scope Out: Findings:                 The examined esophagus was normal.                           Patchy mild inflammation characterized by                            congestion (edema) and erythema was found in the                            cardia and in the gastric fundus. Biopsies were                            taken with a cold forceps for Helicobacter pylori                            testing.                           Diffuse moderate mucosal changes characterized by                            congestion, nodularity and altered texture were                            found in the duodenal bulb and in the immediate  second portion of the duodenum. Biopsies were taken                            with a cold forceps for histology.                           The second portion of the duodenum beyond the sweep                            (including periampullary duodenum), third portion                            of the duodenum and examined fourth portion of the                            duodenum were normal. Complications:            No immediate complications. Estimated Blood Loss:     Estimated blood loss was minimal. Impression:               - Normal esophagus.                           - Gastritis. Biopsied.                           - Mucosal changes in the duodenum (bulb and                            duodenal sweep). Biopsied.                           - Normal second portion of the duodenum, third                            portion of the duodenum and examined fourth portion                            of the duodenum. Recommendation:           -  Patient has a contact number available for                            emergencies. The signs and symptoms of potential                            delayed complications were discussed with the                            patient. Return to normal activities tomorrow.                            Written discharge instructions were provided to the                            patient.                           -  Resume previous diet.                           - Continue present medications.                           - Await pathology results. Gordy CHRISTELLA Starch, MD 06/21/2023 1:58:11 PM This report has been signed electronically.

## 2023-06-21 NOTE — Progress Notes (Signed)
 Sedate, gd SR, tolerated procedure well, VSS, report to RN

## 2023-06-21 NOTE — Progress Notes (Signed)
 Pt's states no medical or surgical changes since previsit or office visit.

## 2023-06-24 ENCOUNTER — Telehealth: Payer: Self-pay | Admitting: *Deleted

## 2023-06-24 NOTE — Telephone Encounter (Signed)
  Follow up Call-     06/21/2023    1:09 PM  Call back number  Post procedure Call Back phone  # [phone]  Permission to leave phone message Yes     Patient questions:   Message left to call if necessary.

## 2023-06-27 NOTE — Progress Notes (Signed)
Surgical Instructions   Your procedure is scheduled on Wednesday, February 19th, 2025. Report to [facility] Main Entrance "A" at 6:30 A.M., then check in with the Admitting office. Any questions or running late day of surgery: call [phone]  Questions prior to your surgery date: call [phone], Monday-Friday, 8am-4pm. If you experience any cold or flu symptoms such as cough, fever, chills, shortness of breath, etc. between now and your scheduled surgery, please notify us at the above number.     Remember:  Do not eat after midnight the night before your surgery   You may drink clear liquids until 5:30 the morning of your surgery.   Clear liquids allowed are: Water, Non-Citrus Juices (without pulp), Carbonated Beverages, Clear Tea (no milk, honey, etc.), Black Coffee Only (NO MILK, CREAM OR POWDERED CREAMER of any kind), and Gatorade.  Patient Instructions  The night before surgery:  No food after midnight. ONLY clear liquids after midnight  The day of surgery (if you do NOT have diabetes):  Drink ONE (1) Pre-Surgery Clear Ensure by 5:30 the morning of surgery. Drink in one sitting. Do not sip.  This drink was given to you during your hospital  pre-op appointment visit.  Nothing else to drink after completing the  Pre-Surgery Clear Ensure.          If you have questions, please contact your surgeon's office.     Take these medicines the morning of surgery with A SIP OF WATER: Carvedilol (Coreg) Cetirizine (Zyrtec) Ezetimibe (Zetia) Fluticasone (Flonase) Hydralazine (Apresoline) Pantoprazole (Protonix) Pravastatin (Pravachol)   May take these medicines IF NEEDED: Acetaminophen (Tylenol) Tizanidine (Zanaflex)    One week prior to surgery, STOP taking any Aspirin (unless otherwise instructed by your surgeon) Aleve, Naproxen, Ibuprofen, Motrin, Advil, Goody's, BC's, all herbal medications, fish oil, and non-prescription vitamins.                     Do NOT  Smoke (Tobacco/Vaping) for 24 hours prior to your procedure.  If you use a CPAP at night, you may bring your mask/headgear for your overnight stay.   You will be asked to remove any contacts, glasses, piercing's, hearing aid's, dentures/partials prior to surgery. Please bring cases for these items if needed.    Patients discharged the day of surgery will not be allowed to drive home, and someone needs to stay with them for 24 hours.  SURGICAL WAITING ROOM VISITATION Patients may have no more than 2 support people in the waiting area - these visitors may rotate.   Pre-op nurse will coordinate an appropriate time for 1 ADULT support person, who may not rotate, to accompany patient in pre-op.  Children under the age of 40 must have an adult with them who is not the patient and must remain in the main waiting area with an adult.  If the patient needs to stay at the hospital during part of their recovery, the visitor guidelines for inpatient rooms apply.  Please refer to the [facility] website for the visitor guidelines for any additional information.   If you received a COVID test during your pre-op visit  it is requested that you wear a mask when out in public, stay away from anyone that may not be feeling well and notify your surgeon if you develop symptoms. If you have been in contact with anyone that has tested positive in the last 10 days please notify you surgeon.      Pre-operative CHG Bathing Instructions  You can play a key role in reducing the risk of infection after surgery. Your skin needs to be as free of germs as possible. You can reduce the number of germs on your skin by washing with CHG (chlorhexidine gluconate) soap before surgery. CHG is an antiseptic soap that kills germs and continues to kill germs even after washing.   DO NOT use if you have an allergy to chlorhexidine/CHG or antibacterial soaps. If your skin becomes reddened or irritated, stop using the CHG and notify  one of our RNs at [phone].              TAKE A SHOWER THE NIGHT BEFORE SURGERY AND THE DAY OF SURGERY    Please keep in mind the following:  DO NOT shave, including legs and underarms, 48 hours prior to surgery.   You may shave your face before/day of surgery.  Place clean sheets on your bed the night before surgery Use a clean washcloth (not used since being washed) for each shower. DO NOT sleep with pet's night before surgery.  CHG Shower Instructions:  Wash your face and private area with normal soap. If you choose to wash your hair, wash first with your normal shampoo.  After you use shampoo/soap, rinse your hair and body thoroughly to remove shampoo/soap residue.  Turn the water OFF and apply half the bottle of CHG soap to a CLEAN washcloth.  Apply CHG soap ONLY FROM YOUR NECK DOWN TO YOUR TOES (washing for 3-5 minutes)  DO NOT use CHG soap on face, private areas, open wounds, or sores.  Pay special attention to the area where your surgery is being performed.  If you are having back surgery, having someone wash your back for you may be helpful. Wait 2 minutes after CHG soap is applied, then you may rinse off the CHG soap.  Pat dry with a clean towel  Put on clean pajamas    Additional instructions for the day of surgery: DO NOT APPLY any lotions, deodorants, cologne, or perfumes.   Do not wear jewelry or makeup Do not wear nail polish, gel polish, artificial nails, or any other type of covering on natural nails (fingers and toes) Do not bring valuables to the hospital. [facility] is not responsible for valuables/personal belongings. Put on clean/comfortable clothes.  Please brush your teeth.  Ask your nurse before applying any prescription medications to the skin.

## 2023-06-28 ENCOUNTER — Encounter (HOSPITAL_COMMUNITY): Payer: Self-pay

## 2023-06-28 ENCOUNTER — Encounter (HOSPITAL_COMMUNITY)
Admission: RE | Admit: 2023-06-28 | Discharge: 2023-06-28 | Disposition: A | Discharge status: Home / Self Care | Payer: Medicare Other | Source: Ambulatory Visit | Attending: Surgery | Admitting: Surgery

## 2023-06-28 ENCOUNTER — Other Ambulatory Visit: Payer: Self-pay

## 2023-06-28 VITALS — BP 145/93 | HR 75 | Temp 98.0°F | Resp 17 | Ht 64.0 in | Wt 193.0 lb

## 2023-06-28 DIAGNOSIS — Z01818 Encounter for other preprocedural examination: Secondary | ICD-10-CM | POA: Diagnosis present

## 2023-06-28 DIAGNOSIS — I1A0 Resistant hypertension: Secondary | ICD-10-CM | POA: Diagnosis not present

## 2023-06-28 DIAGNOSIS — Z01812 Encounter for preprocedural laboratory examination: Secondary | ICD-10-CM | POA: Insufficient documentation

## 2023-06-28 HISTORY — DX: Malignant neoplasm of unspecified site of left female breast: C50.912

## 2023-06-28 HISTORY — DX: Personal history of urinary calculi: Z87.442

## 2023-06-28 HISTORY — DX: Anemia, unspecified: D64.9

## 2023-06-28 LAB — CBC
HCT: 44.8 % (ref 36.0–46.0)
Hemoglobin: 14.4 g/dL (ref 12.0–15.0)
MCH: 31 pg (ref 26.0–34.0)
MCHC: 32.1 g/dL (ref 30.0–36.0)
MCV: 96.3 fL (ref 80.0–100.0)
Platelets: 405 10*3/uL — ABNORMAL HIGH (ref 150–400)
RBC: 4.65 MIL/uL (ref 3.87–5.11)
RDW: 13 % (ref 11.5–15.5)
WBC: 5 10*3/uL (ref 4.0–10.5)
nRBC: 0 % (ref 0.0–0.2)

## 2023-06-28 LAB — BASIC METABOLIC PANEL
Anion gap: 9 (ref 5–15)
BUN: 13 mg/dL (ref 8–23)
CO2: 29 mmol/L (ref 22–32)
Calcium: 11.1 mg/dL — ABNORMAL HIGH (ref 8.9–10.3)
Chloride: 100 mmol/L (ref 98–111)
Creatinine, Ser: 1.12 mg/dL — ABNORMAL HIGH (ref 0.44–1.00)
GFR, Estimated: 53 mL/min — ABNORMAL LOW (ref >60)
Glucose, Bld: 106 mg/dL — ABNORMAL HIGH (ref 70–99)
Potassium: 4.3 mmol/L (ref 3.5–5.1)
Sodium: 138 mmol/L (ref 135–145)

## 2023-06-28 LAB — SURGICAL PATHOLOGY

## 2023-06-28 NOTE — Progress Notes (Signed)
Surgical Instructions   Your procedure is scheduled on Wednesday, February 19th, 2025. Report to [facility] Main Entrance "A" at 6:30 A.M., then check in with the Admitting office. Any questions or running late day of surgery: call [phone]  Questions prior to your surgery date: call [phone], Monday-Friday, 8am-4pm. If you experience any cold or flu symptoms such as cough, fever, chills, shortness of breath, etc. between now and your scheduled surgery, please notify us at the above number.     Remember:  Do not eat after midnight the night before your surgery   You may drink clear liquids until 5:30 the morning of your surgery.   Clear liquids allowed are: Water, Non-Citrus Juices (without pulp), Carbonated Beverages, Clear Tea (no milk, honey, etc.), Black Coffee Only (NO MILK, CREAM OR POWDERED CREAMER of any kind), and Gatorade.  Patient Instructions  The night before surgery:  No food after midnight. ONLY clear liquids after midnight  The day of surgery (if you do NOT have diabetes):  Drink ONE (1) Pre-Surgery Clear Ensure by 5:30 the morning of surgery. Drink in one sitting. Do not sip.  This drink was given to you during your hospital  pre-op appointment visit.  Nothing else to drink after completing the  Pre-Surgery Clear Ensure.          If you have questions, please contact your surgeon's office.     Take these medicines the morning of surgery with A SIP OF WATER: Carvedilol (Coreg) Cetirizine (Zyrtec) Ezetimibe (Zetia) Fluticasone (Flonase) Hydralazine (Apresoline) Pantoprazole (Protonix) Pravastatin (Pravachol)   May take these medicines IF NEEDED: Acetaminophen (Tylenol) Tizanidine (Zanaflex) traMADol (ULTRAM)    Follow your surgeon's instructions on when to stop Aspirin.  If no instructions were given by your surgeon then you will need to call the office to get those instructions.    One week prior to surgery, STOP taking any Aleve,  Naproxen, Ibuprofen, Motrin, Advil, Goody's, BC's, all herbal medications, fish oil, and non-prescription vitamins.                     Do NOT Smoke (Tobacco/Vaping) for 24 hours prior to your procedure.  If you use a CPAP at night, you may bring your mask/headgear for your overnight stay.   You will be asked to remove any contacts, glasses, piercing's, hearing aid's, dentures/partials prior to surgery. Please bring cases for these items if needed.    Patients discharged the day of surgery will not be allowed to drive home, and someone needs to stay with them for 24 hours.  SURGICAL WAITING ROOM VISITATION Patients may have no more than 2 support people in the waiting area - these visitors may rotate.   Pre-op nurse will coordinate an appropriate time for 1 ADULT support person, who may not rotate, to accompany patient in pre-op.  Children under the age of 69 must have an adult with them who is not the patient and must remain in the main waiting area with an adult.  If the patient needs to stay at the hospital during part of their recovery, the visitor guidelines for inpatient rooms apply.  Please refer to the [facility] website for the visitor guidelines for any additional information.   If you received a COVID test during your pre-op visit  it is requested that you wear a mask when out in public, stay away from anyone that may not be feeling well and notify your surgeon if you develop symptoms. If you have  been in contact with anyone that has tested positive in the last 10 days please notify you surgeon.      Pre-operative CHG Bathing Instructions   You can play a key role in reducing the risk of infection after surgery. Your skin needs to be as free of germs as possible. You can reduce the number of germs on your skin by washing with CHG (chlorhexidine gluconate) soap before surgery. CHG is an antiseptic soap that kills germs and continues to kill germs even after washing.   DO NOT  use if you have an allergy to chlorhexidine/CHG or antibacterial soaps. If your skin becomes reddened or irritated, stop using the CHG and notify one of our RNs at [phone].              TAKE A SHOWER THE NIGHT BEFORE SURGERY AND THE DAY OF SURGERY    Please keep in mind the following:  DO NOT shave, including legs and underarms, 48 hours prior to surgery.   You may shave your face before/day of surgery.  Place clean sheets on your bed the night before surgery Use a clean washcloth (not used since being washed) for each shower. DO NOT sleep with pet's night before surgery.  CHG Shower Instructions:  Wash your face and private area with normal soap. If you choose to wash your hair, wash first with your normal shampoo.  After you use shampoo/soap, rinse your hair and body thoroughly to remove shampoo/soap residue.  Turn the water OFF and apply half the bottle of CHG soap to a CLEAN washcloth.  Apply CHG soap ONLY FROM YOUR NECK DOWN TO YOUR TOES (washing for 3-5 minutes)  DO NOT use CHG soap on face, private areas, open wounds, or sores.  Pay special attention to the area where your surgery is being performed.  If you are having back surgery, having someone wash your back for you may be helpful. Wait 2 minutes after CHG soap is applied, then you may rinse off the CHG soap.  Pat dry with a clean towel  Put on clean pajamas    Additional instructions for the day of surgery: DO NOT APPLY any lotions, deodorants, cologne, or perfumes.   Do not wear jewelry or makeup Do not wear nail polish, gel polish, artificial nails, or any other type of covering on natural nails (fingers and toes) Do not bring valuables to the hospital. [facility] is not responsible for valuables/personal belongings. Put on clean/comfortable clothes.  Please brush your teeth.  Ask your nurse before applying any prescription medications to the skin.

## 2023-06-28 NOTE — Progress Notes (Signed)
PCP - Dr. Sanda Linger Cardiologist - denies  PPM/ICD - denies   Chest x-ray - 02/06/23 EKG - 10/17/22 Stress Test - denies ECHO - denies Cardiac Cath - denies  Sleep Study - denies   DM- denies  Last dose of GLP1 agonist-  n/a   Blood Thinner Instructions: n/a Aspirin Instructions: f/u with surgeon  ERAS Protcol - yes PRE-SURGERY Ensure given at PAT  COVID TEST- n/a   Anesthesia review: yes, seed placement  Patient denies shortness of breath, fever, cough and chest pain at PAT appointment   All instructions explained to the patient, with a verbal understanding of the material. Patient agrees to go over the instructions while at home for a better understanding. The opportunity to ask questions was provided.

## 2023-07-01 ENCOUNTER — Other Ambulatory Visit: Payer: Self-pay | Admitting: Internal Medicine

## 2023-07-01 DIAGNOSIS — E785 Hyperlipidemia, unspecified: Secondary | ICD-10-CM

## 2023-07-02 ENCOUNTER — Ambulatory Visit
Admission: RE | Admit: 2023-07-02 | Discharge: 2023-07-02 | Disposition: A | Discharge status: Home / Self Care | Payer: Medicare Other | Source: Ambulatory Visit | Attending: Surgery | Admitting: Surgery

## 2023-07-02 ENCOUNTER — Other Ambulatory Visit: Payer: Self-pay | Admitting: Surgery

## 2023-07-02 DIAGNOSIS — Z853 Personal history of malignant neoplasm of breast: Secondary | ICD-10-CM

## 2023-07-02 DIAGNOSIS — C50212 Malignant neoplasm of upper-inner quadrant of left female breast: Secondary | ICD-10-CM | POA: Diagnosis not present

## 2023-07-02 HISTORY — PX: BREAST BIOPSY: SHX20

## 2023-07-02 NOTE — Anesthesia Preprocedure Evaluation (Signed)
Anesthesia Evaluation  Patient identified by MRN, date of birth, ID band Patient awake    Reviewed: Allergy & Precautions, H&P , NPO status , Patient's Chart, lab work & pertinent test results  Airway Mallampati: II  TM Distance: >3 FB Neck ROM: Full    Dental no notable dental hx. (+) Edentulous Upper, Edentulous Lower, Dental Advisory Given   Pulmonary asthma    Pulmonary exam normal breath sounds clear to auscultation       Cardiovascular Exercise Tolerance: Good hypertension, Pt. on medications and Pt. on home beta blockers  Rhythm:Regular Rate:Normal     Neuro/Psych negative neurological ROS  negative psych ROS   GI/Hepatic Neg liver ROS,GERD  Medicated,,  Endo/Other  negative endocrine ROS    Renal/GU negative Renal ROS  negative genitourinary   Musculoskeletal  (+) Arthritis , Osteoarthritis,    Abdominal   Peds  Hematology  (+) Blood dyscrasia, anemia   Anesthesia Other Findings   Reproductive/Obstetrics negative OB ROS                             Anesthesia Physical Anesthesia Plan  ASA: 3  Anesthesia Plan: General   Post-op Pain Management: Regional block* and Tylenol PO (pre-op)*   Induction: Intravenous  PONV Risk Score and Plan: 4 or greater and Ondansetron, Dexamethasone and Treatment may vary due to age or medical condition  Airway Management Planned: LMA  Additional Equipment:   Intra-op Plan:   Post-operative Plan: Extubation in OR  Informed Consent: I have reviewed the patients History and Physical, chart, labs and discussed the procedure including the risks, benefits and alternatives for the proposed anesthesia with the patient or authorized representative who has indicated his/her understanding and acceptance.     Dental advisory given  Plan Discussed with: CRNA  Anesthesia Plan Comments:        Anesthesia Quick Evaluation

## 2023-07-02 NOTE — H&P (Signed)
PROVIDER: Wayne Both, MD  MRN: [id number] DOB: 17-Jun-1955  Subjective   Chief Complaint: New Consultation (Lt Br CA x2 areas)   History of Present Illness: Kayla Lowe is a 68 y.o. female who is seen  for for a follow-up. 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 Her PET scan as part of her workup postoperatively showed 2 small lesions in her breast. These were on the left side. 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 Both were ER and PR positive, HER2 negative, and 1 had a Ki-67 of 30% and the other had a Ki-67 of 20%. Both the masses are at 10:00 19 cm from the nipple. She has already seen medical oncology regarding both the lymphoma and now the breast cancer.  She has no previous problems regarding her breast and denies nipple discharge.    Review of Systems: A complete review of systems was obtained from the patient. I have reviewed this information and discussed as appropriate with the patient. See HPI as well for other ROS.  ROS   Medical History: Past Medical History:  Diagnosis Date  Arthritis  Chronic kidney disease  GERD (gastroesophageal reflux disease)  Hypertension   There is no problem list on file for this patient.  Past Surgical History:  Procedure Laterality Date  EXPLORATORY LAPAROTOMY, WITH SMALL BOWEL RESECTION 02/04/2023  Dr. Barrie Dunker  HYSTERECTOMY    Allergies  Allergen Reactions  Statins-Hmg-Coa Reductase Inhibitors Other (See Comments)  myalgias   Current Outpatient Medications on File Prior to Visit  Medication Sig Dispense Refill  ACCRUFER 30 mg Cap TAKE ONE CAPSULE BY MOUTH IN THE MORNING AND AT BEDTIME  acetaminophen (TYLENOL) 500 MG tablet Take by mouth  aspirin 81 MG EC tablet Take 81 mg by mouth once daily  carvediloL (COREG) 6.25 MG tablet Take 6.25 mg by mouth 2 (two) times daily with meals  cetirizine (ZYRTEC) 10 MG tablet Take  10 mg by mouth once daily  cholecalciferol (VITAMIN D3) 2,000 unit capsule Take 1 capsule by mouth once daily  cyclobenzaprine (FLEXERIL) 10 MG tablet Take 10 mg by mouth 3 (three) times daily as needed  ezetimibe (ZETIA) 10 mg tablet Take 1 tablet by mouth once daily  famotidine (PEPCID) 40 MG tablet Take 40 mg by mouth at bedtime  hydrALAZINE (APRESOLINE) 25 MG tablet Take 25 mg by mouth 3 (three) times daily  montelukast (SINGULAIR) 10 mg tablet Take 10 mg by mouth at bedtime  ondansetron (ZOFRAN) 4 MG tablet Take 4 mg by mouth every 6 (six) hours as needed  pantoprazole (PROTONIX) 20 MG DR tablet Take 20 mg by mouth once daily  polyethylene glycol (MIRALAX) packet Take 17 g by mouth once daily as needed  pravastatin (PRAVACHOL) 40 MG tablet Take 40 mg by mouth once daily  tiZANidine (ZANAFLEX) 2 MG tablet Take 2 mg by mouth  amLODIPine (NORVASC) 10 MG tablet Take 10 mg by mouth once daily  potassium chloride (KLOR-CON M20) 20 MEQ ER tablet Take 40 mg by mouth once daily  traMADoL (ULTRAM) 50 mg tablet Take by mouth   No current facility-administered medications on file prior to visit.   Family History  Problem Relation Age of Onset  High blood pressure (Hypertension) Mother  Stroke Father  High blood pressure (Hypertension) Father  Coronary Artery Disease (Blocked arteries around heart) Brother  Diabetes Brother  High blood pressure (Hypertension) Brother  Diabetes Paternal Uncle  Diabetes  Paternal Grandfather    Social History   Tobacco Use  Smoking Status Former  Types: Cigarettes  Smokeless Tobacco Never    Social History   Socioeconomic History  Marital status: Legally Separated  Tobacco Use  Smoking status: Former  Types: Cigarettes  Smokeless tobacco: Never  Vaping Use  Vaping status: Never Used  Substance and Sexual Activity  Alcohol use: Not Currently  Drug use: Never   Social Drivers of Corporate investment banker Strain: Low Risk (03/22/2022)   Received from [facility]  Overall Financial Resource Strain (CARDIA)  Difficulty of Paying Living Expenses: Not hard at all  Food Insecurity: No Food Insecurity (02/03/2023)  Received from [facility]  Hunger Vital Sign  Worried About Running Out of Food in the Last Year: Never true  Ran Out of Food in the Last Year: Never true  Transportation Needs: No Transportation Needs (02/03/2023)  Received from [facility] - Transportation  Lack of Transportation (Medical): No  Lack of Transportation (Non-Medical): No  Physical Activity: Inactive (03/22/2022)  Received from [facility]  Exercise Vital Sign  Days of Exercise per Week: 0 days  Minutes of Exercise per Session: 0 min  Stress: No Stress Concern Present (03/22/2022)  Received from [facility] - Occupational Stress Questionnaire  Feeling of Stress : Not at all  Social Connections: Moderately Integrated (03/22/2022)  Received from [facility]  Social Connection and Isolation Panel [NHANES]  Frequency of Communication with Friends and Family: More than three times a week  Frequency of Social Gatherings with Friends and Family: More than three times a week  Attends Religious Services: More than 4 times per year  Active Member of [employer] or Organizations: No  Attends Engineer, structural: More than 4 times per year  Marital Status: Separated  Housing Stability: Unknown (05/27/2023)  Housing Stability Vital Sign  Homeless in the Last Year: No   Objective:   Vitals:   PainSc: 0-No pain   There is no height or weight on file to calculate BMI.  Physical Exam   She appears well on exam  There are no palpable masses in either breast and no axillary adenopathy  Labs, Imaging and Diagnostic Testing:  I again reviewed all the notes and imaging in her electronic medical records  Assessment and Plan:   Diagnoses and all orders for this visit:  Invasive ductal carcinoma  of breast, left (CMS/HHS-HCC)    She now has 2 small invasive ductal carcinomas in the left breast at the 10 o'clock position. 1 measures 1.1 cm and the other measures 1.4 cm with a total area measuring 2.5 cm. From a surgical standpoint we discussed breast conservation with lumpectomy versus mastectomy. She is interested in breast conservation as well as seeing plastic surgery to consider an oncoplastic reduction. From my standpoint I explained proceeding with a radioactive seed guided left breast lumpectomy x 2 with axillary sentinel lymph node biopsy. We discussed the reasonings for biopsying the lymph nodes because of her malignancies. I explained both surgical sutures in detail. We discussed the risk which includes but is not limited to bleeding, infection, injury to surrounding structures, the need for further surgery if margins or lymph nodes are positive, cardiopulmonary issues with anesthesia, etc. She understands with the plans and we will proceed appropriately.

## 2023-07-03 ENCOUNTER — Other Ambulatory Visit: Payer: Self-pay

## 2023-07-03 ENCOUNTER — Encounter (HOSPITAL_COMMUNITY)
Admission: RE | Disposition: A | Discharge status: Home / Self Care | Payer: Self-pay | Source: Home / Self Care | Attending: Surgery

## 2023-07-03 ENCOUNTER — Encounter (HOSPITAL_COMMUNITY): Payer: Self-pay | Admitting: Surgery

## 2023-07-03 ENCOUNTER — Ambulatory Visit
Admission: RE | Admit: 2023-07-03 | Discharge: 2023-07-03 | Disposition: A | Discharge status: Home / Self Care | Payer: Medicare Other | Source: Ambulatory Visit | Attending: Surgery | Admitting: Surgery

## 2023-07-03 ENCOUNTER — Ambulatory Visit (HOSPITAL_COMMUNITY): Payer: Medicare Other | Admitting: Physician Assistant

## 2023-07-03 ENCOUNTER — Ambulatory Visit (HOSPITAL_COMMUNITY)
Admission: RE | Admit: 2023-07-03 | Discharge: 2023-07-03 | Disposition: A | Discharge status: Home / Self Care | Payer: Medicare Other | Attending: Surgery | Admitting: Surgery

## 2023-07-03 ENCOUNTER — Ambulatory Visit (HOSPITAL_COMMUNITY): Payer: Medicare Other | Admitting: Anesthesiology

## 2023-07-03 DIAGNOSIS — I10 Essential (primary) hypertension: Secondary | ICD-10-CM | POA: Diagnosis not present

## 2023-07-03 DIAGNOSIS — I129 Hypertensive chronic kidney disease with stage 1 through stage 4 chronic kidney disease, or unspecified chronic kidney disease: Secondary | ICD-10-CM | POA: Insufficient documentation

## 2023-07-03 DIAGNOSIS — Z170 Estrogen receptor positive status [ER+]: Secondary | ICD-10-CM | POA: Diagnosis not present

## 2023-07-03 DIAGNOSIS — Z79899 Other long term (current) drug therapy: Secondary | ICD-10-CM | POA: Insufficient documentation

## 2023-07-03 DIAGNOSIS — K219 Gastro-esophageal reflux disease without esophagitis: Secondary | ICD-10-CM | POA: Diagnosis not present

## 2023-07-03 DIAGNOSIS — Z1721 Progesterone receptor positive status: Secondary | ICD-10-CM | POA: Insufficient documentation

## 2023-07-03 DIAGNOSIS — Z1732 Human epidermal growth factor receptor 2 negative status: Secondary | ICD-10-CM | POA: Diagnosis not present

## 2023-07-03 DIAGNOSIS — J45909 Unspecified asthma, uncomplicated: Secondary | ICD-10-CM

## 2023-07-03 DIAGNOSIS — Z853 Personal history of malignant neoplasm of breast: Secondary | ICD-10-CM

## 2023-07-03 DIAGNOSIS — C50912 Malignant neoplasm of unspecified site of left female breast: Secondary | ICD-10-CM | POA: Diagnosis not present

## 2023-07-03 DIAGNOSIS — N189 Chronic kidney disease, unspecified: Secondary | ICD-10-CM | POA: Insufficient documentation

## 2023-07-03 DIAGNOSIS — C50212 Malignant neoplasm of upper-inner quadrant of left female breast: Secondary | ICD-10-CM | POA: Diagnosis not present

## 2023-07-03 DIAGNOSIS — G8918 Other acute postprocedural pain: Secondary | ICD-10-CM | POA: Diagnosis not present

## 2023-07-03 HISTORY — PX: BREAST LUMPECTOMY WITH RADIOACTIVE SEED AND SENTINEL LYMPH NODE BIOPSY: SHX6550

## 2023-07-03 SURGERY — BREAST LUMPECTOMY WITH RADIOACTIVE SEED AND SENTINEL LYMPH NODE BIOPSY
Anesthesia: Regional | Site: Breast | Laterality: Left

## 2023-07-03 MED ORDER — CHLORHEXIDINE GLUCONATE CLOTH 2 % EX PADS: 6.0000 | MEDICATED_PAD | Freq: Once | CUTANEOUS | Status: DC

## 2023-07-03 MED ORDER — CEFAZOLIN SODIUM-DEXTROSE 2-4 GM/100ML-% IV SOLN
2.0000 g | INTRAVENOUS | Status: AC
  Administered 2023-07-03: 2 g via INTRAVENOUS
  Filled 2023-07-03: qty 100

## 2023-07-03 MED ORDER — ONDANSETRON HCL 4 MG/2ML IJ SOLN
INTRAMUSCULAR | Status: DC | PRN
  Administered 2023-07-03: 4 mg via INTRAVENOUS

## 2023-07-03 MED ORDER — BUPIVACAINE-EPINEPHRINE (PF) 0.25% -1:200000 IJ SOLN
INTRAMUSCULAR | Status: AC
  Filled 2023-07-03: qty 30

## 2023-07-03 MED ORDER — PROPOFOL 10 MG/ML IV BOLUS
INTRAVENOUS | Status: DC | PRN
Start: 2023-07-03 — End: 2023-07-03
  Administered 2023-07-03: 140 mg via INTRAVENOUS

## 2023-07-03 MED ORDER — FENTANYL CITRATE (PF) 250 MCG/5ML IJ SOLN
INTRAMUSCULAR | Status: DC | PRN
  Administered 2023-07-03 (×2): 50 ug via INTRAVENOUS

## 2023-07-03 MED ORDER — ONDANSETRON HCL 4 MG/2ML IJ SOLN
INTRAMUSCULAR | Status: AC
  Filled 2023-07-03: qty 2

## 2023-07-03 MED ORDER — MIDAZOLAM HCL 2 MG/2ML IJ SOLN
INTRAMUSCULAR | Status: AC
Start: 2023-07-03 — End: ?
  Filled 2023-07-03: qty 2

## 2023-07-03 MED ORDER — 0.9 % SODIUM CHLORIDE (POUR BTL) OPTIME
TOPICAL | Status: DC | PRN
  Administered 2023-07-03: 1000 mL

## 2023-07-03 MED ORDER — BUPIVACAINE-EPINEPHRINE 0.25% -1:200000 IJ SOLN
INTRAMUSCULAR | Status: DC | PRN
  Administered 2023-07-03: 20 mL

## 2023-07-03 MED ORDER — MIDAZOLAM HCL 2 MG/2ML IJ SOLN
INTRAMUSCULAR | Status: DC | PRN
  Administered 2023-07-03: 2 mg via INTRAVENOUS

## 2023-07-03 MED ORDER — LACTATED RINGERS IV SOLN: INTRAVENOUS | Status: DC

## 2023-07-03 MED ORDER — MAGTRACE LYMPHATIC TRACER
INTRAMUSCULAR | Status: DC | PRN
  Administered 2023-07-03: 2 mL via INTRAMUSCULAR

## 2023-07-03 MED ORDER — LIDOCAINE 2% (20 MG/ML) 5 ML SYRINGE
INTRAMUSCULAR | Status: DC | PRN
  Administered 2023-07-03: 80 mg via INTRAVENOUS

## 2023-07-03 MED ORDER — BUPIVACAINE-EPINEPHRINE (PF) 0.5% -1:200000 IJ SOLN
INTRAMUSCULAR | Status: DC | PRN
  Administered 2023-07-03: 20 mL via PERINEURAL

## 2023-07-03 MED ORDER — LIDOCAINE 2% (20 MG/ML) 5 ML SYRINGE
INTRAMUSCULAR | Status: AC
  Filled 2023-07-03: qty 5

## 2023-07-03 MED ORDER — EPHEDRINE SULFATE-NACL 50-0.9 MG/10ML-% IV SOSY
PREFILLED_SYRINGE | INTRAVENOUS | Status: DC | PRN
Start: 2023-07-03 — End: 2023-07-03
  Administered 2023-07-03: 10 mg via INTRAVENOUS
  Administered 2023-07-03: 5 mg via INTRAVENOUS

## 2023-07-03 MED ORDER — PROPOFOL 10 MG/ML IV BOLUS
INTRAVENOUS | Status: AC
  Filled 2023-07-03: qty 20

## 2023-07-03 MED ORDER — CHLORHEXIDINE GLUCONATE 0.12 % MT SOLN
15.0000 mL | Freq: Once | OROMUCOSAL | Status: AC
  Administered 2023-07-03: 15 mL via OROMUCOSAL
  Filled 2023-07-03: qty 15

## 2023-07-03 MED ORDER — FENTANYL CITRATE (PF) 250 MCG/5ML IJ SOLN
INTRAMUSCULAR | Status: AC
  Filled 2023-07-03: qty 5

## 2023-07-03 MED ORDER — ENSURE PRE-SURGERY PO LIQD: 296.0000 mL | Freq: Once | ORAL | Status: DC

## 2023-07-03 MED ORDER — FENTANYL CITRATE (PF) 100 MCG/2ML IJ SOLN: 25.0000 ug | INTRAMUSCULAR | Status: DC | PRN

## 2023-07-03 MED ORDER — PHENYLEPHRINE 80 MCG/ML (10ML) SYRINGE FOR IV PUSH (FOR BLOOD PRESSURE SUPPORT)
PREFILLED_SYRINGE | INTRAVENOUS | Status: DC | PRN
  Administered 2023-07-03 (×4): 160 ug via INTRAVENOUS

## 2023-07-03 MED ORDER — ORAL CARE MOUTH RINSE: 15.0000 mL | Freq: Once | OROMUCOSAL | Status: AC

## 2023-07-03 MED ORDER — BUPIVACAINE LIPOSOME 1.3 % IJ SUSP
INTRAMUSCULAR | Status: DC | PRN
  Administered 2023-07-03: 10 mL via PERINEURAL

## 2023-07-03 MED ORDER — ACETAMINOPHEN 500 MG PO TABS
1000.0000 mg | ORAL_TABLET | ORAL | Status: DC
Start: 2023-07-03 — End: 2023-07-03
  Filled 2023-07-03: qty 2

## 2023-07-03 SURGICAL SUPPLY — 36 items
APPLIER CLIP 9.375 MED OPEN (MISCELLANEOUS) ×1
BAG COUNTER SPONGE SURGICOUNT (BAG) ×2 IMPLANT
BINDER BREAST LRG (GAUZE/BANDAGES/DRESSINGS) IMPLANT
BINDER BREAST XLRG (GAUZE/BANDAGES/DRESSINGS) IMPLANT
CANISTER SUCT 3000ML PPV (MISCELLANEOUS) ×2 IMPLANT
CHLORAPREP W/TINT 26 (MISCELLANEOUS) ×2 IMPLANT
CLIP APPLIE 9.375 MED OPEN (MISCELLANEOUS) ×2 IMPLANT
CNTNR URN SCR LID CUP LEK RST (MISCELLANEOUS) IMPLANT
COVER PROBE W GEL 5X96 (DRAPES) ×4 IMPLANT
COVER SURGICAL LIGHT HANDLE (MISCELLANEOUS) ×2 IMPLANT
DERMABOND ADVANCED .7 DNX12 (GAUZE/BANDAGES/DRESSINGS) ×2 IMPLANT
DEVICE DUBIN SPECIMEN MAMMOGRA (MISCELLANEOUS) ×2 IMPLANT
DRAPE CHEST BREAST 15X10 FENES (DRAPES) ×2 IMPLANT
ELECT CAUTERY BLADE 6.4 (BLADE) ×2 IMPLANT
ELECT REM PT RETURN 9FT ADLT (ELECTROSURGICAL) ×1
ELECTRODE REM PT RTRN 9FT ADLT (ELECTROSURGICAL) ×2 IMPLANT
GLOVE SURG SIGNA 7.5 PF LTX (GLOVE) ×2 IMPLANT
GOWN STRL REUS W/ TWL LRG LVL3 (GOWN DISPOSABLE) ×2 IMPLANT
GOWN STRL REUS W/ TWL XL LVL3 (GOWN DISPOSABLE) ×2 IMPLANT
KIT BASIN OR (CUSTOM PROCEDURE TRAY) ×2 IMPLANT
KIT MARKER MARGIN INK (KITS) ×2 IMPLANT
NDL 18GX1X1/2 (RX/OR ONLY) (NEEDLE) IMPLANT
NDL FILTER BLUNT 18X1 1/2 (NEEDLE) IMPLANT
NDL HYPO 25GX1X1/2 BEV (NEEDLE) ×2 IMPLANT
NEEDLE 18GX1X1/2 (RX/OR ONLY) (NEEDLE)
NEEDLE FILTER BLUNT 18X1 1/2 (NEEDLE)
NEEDLE HYPO 25GX1X1/2 BEV (NEEDLE) ×1
NS IRRIG 1000ML POUR BTL (IV SOLUTION) ×2 IMPLANT
PACK GENERAL/GYN (CUSTOM PROCEDURE TRAY) ×2 IMPLANT
SUT MNCRL AB 4-0 PS2 18 (SUTURE) ×2 IMPLANT
SUT VIC AB 3-0 SH 18 (SUTURE) ×2 IMPLANT
SYR 3ML LL SCALE MARK (SYRINGE) IMPLANT
SYR CONTROL 10ML LL (SYRINGE) ×2 IMPLANT
TOWEL GREEN STERILE (TOWEL DISPOSABLE) ×2 IMPLANT
TOWEL GREEN STERILE FF (TOWEL DISPOSABLE) ×2 IMPLANT
TRACER MAGTRACE VIAL (MISCELLANEOUS) IMPLANT

## 2023-07-03 NOTE — Interval H&P Note (Signed)
History and Physical Interval Note:no change in H and P  07/03/2023 6:51 AM  Kayla Lowe  has presented today for surgery, with the diagnosis of LEFT BREAST CANCER x2.  The various methods of treatment have been discussed with the patient and family. After consideration of risks, benefits and other options for treatment, the patient has consented to  Procedure(s) with comments: RADIOACTIVE SEED GUIDED LEFT BREAST LUMPECTOMY x2 AND LEFT SENTINEL NODE BIOPSY (Left) - LMA as a surgical intervention.  The patient's history has been reviewed, patient examined, no change in status, stable for surgery.  I have reviewed the patient's chart and labs.  Questions were answered to the patient's satisfaction.     Abigail Miyamoto

## 2023-07-03 NOTE — Anesthesia Procedure Notes (Signed)
Procedure Name: LMA Insertion Date/Time: 07/03/2023 8:41 AM  Performed by: Gus Puma, CRNAPre-anesthesia Checklist: Patient identified, Emergency Drugs available, Suction available and Patient being monitored Patient Re-evaluated:Patient Re-evaluated prior to induction Oxygen Delivery Method: Circle System Utilized Preoxygenation: Pre-oxygenation with 100% oxygen Induction Type: IV induction Ventilation: Mask ventilation without difficulty LMA: LMA inserted LMA Size: 4.0 Number of attempts: 1 Airway Equipment and Method: Bite block Placement Confirmation: positive ETCO2 and breath sounds checked- equal and bilateral Tube secured with: Tape Dental Injury: Teeth and Oropharynx as per pre-operative assessment

## 2023-07-03 NOTE — Discharge Instructions (Signed)
[facility] Office Phone Number [phone]  BREAST BIOPSY/ PARTIAL MASTECTOMY: POST OP INSTRUCTIONS  Always review your discharge instruction sheet given to you by the facility where your surgery was performed.  IF YOU HAVE DISABILITY OR FAMILY LEAVE FORMS, YOU MUST BRING THEM TO THE OFFICE FOR PROCESSING.  DO NOT GIVE THEM TO YOUR DOCTOR.  A prescription for pain medication may be given to you upon discharge.  Take your pain medication as prescribed, if needed.  If narcotic pain medicine is not needed, then you may take acetaminophen (Tylenol) or ibuprofen (Advil) as needed. Take your usually prescribed medications unless otherwise directed If you need a refill on your pain medication, please contact your pharmacy.  They will contact our office to request authorization.  Prescriptions will not be filled after 5pm or on week-ends. You should eat very light the first 24 hours after surgery, such as soup, crackers, pudding, etc.  Resume your normal diet the day after surgery. Most patients will experience some swelling and bruising in the breast.  Ice packs and a good support bra will help.  Swelling and bruising can take several days to resolve.  It is common to experience some constipation if taking pain medication after surgery.  Increasing fluid intake and taking a stool softener will usually help or prevent this problem from occurring.  A mild laxative (Milk of Magnesia or Miralax) should be taken according to package directions if there are no bowel movements after 48 hours. Unless discharge instructions indicate otherwise, you may remove your bandages 24-48 hours after surgery, and you may shower at that time.  You may have steri-strips (small skin tapes) in place directly over the incision.  These strips should be left on the skin for 7-10 days.  If your surgeon used skin glue on the incision, you may shower in 24 hours.  The glue will flake off over the next 2-3 weeks.  Any  sutures or staples will be removed at the office during your follow-up visit. ACTIVITIES:  You may resume regular daily activities (gradually increasing) beginning the next day.  Wearing a good support bra or sports bra minimizes pain and swelling.  You may have sexual intercourse when it is comfortable. You may drive when you no longer are taking prescription pain medication, you can comfortably wear a seatbelt, and you can safely maneuver your car and apply brakes. RETURN TO WORK:  ______________________________________________________________________________________ Bonita Quin should see your doctor in the office for a follow-up appointment approximately two weeks after your surgery.  Your doctor's nurse will typically make your follow-up appointment when she calls you with your pathology report.  Expect your pathology report 2-3 business days after your surgery.  You may call to check if you do not hear from [place] after three days. OTHER INSTRUCTIONS: YOU MAY SHOWER STARTING TOMORROW ICE PACK, TYLENOL ALSO FOR PAIN NO VIGOROUS ACTIVITY FOR ONE WEEK YOU MAY DRIVE STARTING TOMORROW IF NOT TAKING NARCOTICS_______________________________________________________________________________________________ _____________________________________________________________________________________________________________________________________ _____________________________________________________________________________________________________________________________________ _____________________________________________________________________________________________________________________________________  WHEN TO CALL YOUR DOCTOR: Fever over 101.0 Nausea and/or vomiting. Extreme swelling or bruising. Continued bleeding from incision. Increased pain, redness, or drainage from the incision.  The clinic staff is available to answer your questions during regular business hours.  Please don't hesitate to call and ask to  speak to one of the nurses for clinical concerns.  If you have a medical emergency, go to the nearest emergency room or call 911.  A surgeon from [facility] is always on call at the hospital.  For further questions, please visit [email or web address]

## 2023-07-03 NOTE — Op Note (Signed)
Bernese Doffing 07/03/2023   Pre-op Diagnosis: LEFT BREAST CANCER x2     Post-op Diagnosis: same  Procedure(s): RADIOACTIVE SEED GUIDED LEFT BREAST LUMPECTOMY DEEP LEFT AXILLARY SENTINEL LYMPH NODE BIOPSY INJECTION OF MAG TRACE FOR LYMPH NODE MAPPING   Surgeon(s): Abigail Miyamoto, MD  Anesthesia: General  Staff:  Circulator: Pietro Cassis, RN Scrub Person: Oswaldo Conroy, RN Circulator Assistant: Despina Arias, RN  Estimated Blood Loss: Minimal               Specimens: sent to path  Indications: This is a 68 year old female with a recent diagnosis of small bowel lymphoma who was found to have 2 small masses in her left breast extremely medially on a follow-up PET scan for the lymphoma.  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  The decision was made to proceed with a radioactive seed guided lumpectomy with seeds placed in both biopsy sites as well as sentinel lymph node biopsy  Procedure: The patient was brought to the operating identifies the correct patient.  She is placed upon the operating table and general anesthesia was induced.  Under sterile technique I next injected mag trace underneath the left nipple areolar complex and massaged the breast.  Her left breast and axilla were then prepped and draped in usual sterile fashion.  Using the neoprobe I then identified the 2 radioactive seeds in the upper inner quadrant of the left breast near the sternum.  I anesthetized skin in this area with Marcaine and then made an elliptical skin incision with scalpel.  I then dissected down to the breast tissue with electrocautery.  With the aid of the neoprobe I then dissected circumferentially around both signals and going down all the way to the chest wall.  I took the dissection toward the sternum medially and then took the lumpectomy specimen off of the chest wall with electrocautery.  Once the specimen was removed all margins were marked  with paint.  The most medial margin is at the sternum and the posterior margin is the chest wall.  The anterior margin is the skin.  An x-ray was performed on the specimen confirming that both radioactive seeds and both biopsy clips were in the specimen.  The specimen was then sent to pathology for evaluation.  I next identified an area of increased uptake of mag trace in the left axilla.  I anesthetized skin with Marcaine and made an incision with a scalpel.  I then dissected down into the deep axillary tissue.  I then identified several lymph nodes with uptake of mag trace using the probe.  I excised these en bloc with the electrocautery and sent in the pathology for evaluation.  Hemostasis was achieved with the cautery.  There were no other palpable lymph nodes and no other uptake of mag trace in the axilla.  Hemostasis was achieved in both incisions with the cautery.  I injected further Marcaine into the lumpectomy site.  I placed surgical clips around the periphery of the lumpectomy cavity for marking purposes.  I then closed both incisions with interrupted 3-0 Vicryl sutures and running 4-0 Monocryl.  Dermabond was then applied.  The patient tolerated the procedure well.  All the counts were correct at the end of the procedure.  The patient was then extubated in the operating room and taken in a stable condition to the recovery room.          Abigail Miyamoto   Date: 07/03/2023  Time: 9:47  AM

## 2023-07-03 NOTE — Anesthesia Procedure Notes (Signed)
Anesthesia Regional Block: Pectoralis block   Pre-Anesthetic Checklist: , timeout performed,  Correct Patient, Correct Site, Correct Laterality,  Correct Procedure, Correct Position, site marked,  Risks and benefits discussed,  Pre-op evaluation,  At surgeon's request and post-op pain management  Laterality: Left  Prep: Maximum Sterile Barrier Precautions used, chloraprep       Needles:  Injection technique: Single-shot  Needle Type: Echogenic Stimulator Needle     Needle Length: 9cm  Needle Gauge: 21     Additional Needles:   Procedures:,,,, ultrasound used (permanent image in chart),,    Narrative:  Start time: 07/03/2023 8:02 AM End time: 07/03/2023 8:12 AM Injection made incrementally with aspirations every 5 mL.  Performed by: Personally  Anesthesiologist: Gaynelle Adu, MD

## 2023-07-03 NOTE — Transfer of Care (Signed)
Immediate Anesthesia Transfer of Care Note  Patient: Kayla Lowe  Procedure(s) Performed: RADIOACTIVE SEED GUIDED LEFT BREAST LUMPECTOMY x2 AND LEFT SENTINEL NODE BIOPSY (Left: Breast)  Patient Location: PACU  Anesthesia Type:General and Regional  Level of Consciousness: drowsy and patient cooperative  Airway & Oxygen Therapy: Patient Spontanous Breathing  Post-op Assessment: Report given to RN and Post -op Vital signs reviewed and stable  Post vital signs: Reviewed and stable  Last Vitals:  Vitals Value Taken Time  BP 125/69 07/03/23 0950  Temp    Pulse 70 07/03/23 0954  Resp 11 07/03/23 0954  SpO2 97 % 07/03/23 0954  Vitals shown include unfiled device data.  Last Pain:  Vitals:   07/03/23 0706  TempSrc:   PainSc: 0-No pain         Complications: No notable events documented.

## 2023-07-03 NOTE — Anesthesia Postprocedure Evaluation (Signed)
Anesthesia Post Note  Patient: Lindyn Vossler  Procedure(s) Performed: RADIOACTIVE SEED GUIDED LEFT BREAST LUMPECTOMY x2 AND LEFT SENTINEL NODE BIOPSY (Left: Breast)     Patient location during evaluation: PACU Anesthesia Type: Regional and General Level of consciousness: awake and alert Pain management: pain level controlled Vital Signs Assessment: post-procedure vital signs reviewed and stable Respiratory status: spontaneous breathing, nonlabored ventilation and respiratory function stable Cardiovascular status: blood pressure returned to baseline and stable Postop Assessment: no apparent nausea or vomiting Anesthetic complications: no  No notable events documented.  Last Vitals:  Vitals:   07/03/23 1000 07/03/23 1015  BP: (!) 146/73 (!) 146/81  Pulse: 69 68  Resp: 18 11  Temp:  36.4 C  SpO2: 97% 100%    Last Pain:  Vitals:   07/03/23 0950  TempSrc:   PainSc: 3                  Zamyra Allensworth,W. EDMOND

## 2023-07-04 ENCOUNTER — Other Ambulatory Visit: Payer: Medicare Other | Admitting: Pharmacist

## 2023-07-04 ENCOUNTER — Encounter (HOSPITAL_COMMUNITY): Payer: Self-pay | Admitting: Surgery

## 2023-07-04 DIAGNOSIS — I119 Hypertensive heart disease without heart failure: Secondary | ICD-10-CM

## 2023-07-04 DIAGNOSIS — I1A0 Resistant hypertension: Secondary | ICD-10-CM

## 2023-07-04 NOTE — Progress Notes (Signed)
07/05/2023 Name: Kayla Lowe MRN: [id number] DOB: August 08, 1955  Chief Complaint  Patient presents with   Hypertension   Medication Management    Kayla Lowe is a 68 y.o. year old female who presented for a telephone visit.   They were referred to the pharmacist by their PCP for assistance in managing hypertension.   Subjective:  Care Team: Primary Care Provider: Etta Grandchild, MD ; Next Scheduled Visit: 09/16/23   Medication Access/Adherence  Current Pharmacy:  [facility][id number] G[place] Otto, [place] - [address] AT [facility] OF [facility] [address] [place] [address] Phone: [phone] Fax: [phone]  [facility] [place], [place] - [address] [phone] [address] [place] [address] Phone: [phone] Fax: [phone]   Patient reports affordability concerns with their medications: No  Patient reports access/transportation concerns to their pharmacy: No  Patient reports adherence concerns with their medications:  No    Pt has a lot going on right now with  2 recent cancer diagnoses. Just had lumpectomy surgery yesterday and has more follow up with plastic surgery and oncology upcoming.   Hypertension:  Current medications: carvedilol 6.25 mg twice daily, hydralazine 25 mg TID, triamterene/hydrochlorothiazide 37/25 mg daily (started 06/19/23) Medications previously tried: intolerance to losartan and lisinopril  Patient does not have a validated, automated, upper arm home BP cuff. She has been using a wrist cuff. She reports BP have been low for the past 2 weeks. Current blood pressure readings: 103/55, 96/56, 106/62, 106/64, 103/69. Reports they are higher in the AM prior to medication (136/85)  Patient denies hypotensive s/sx including dizziness, lightheadedness.  She notes she can tell when BP is low but does not report dizziness.    Objective:  Lab Results  Component Value  Date   HGBA1C 5.4 08/31/2019    Lab Results  Component Value Date   CREATININE 1.12 (H) 06/28/2023   BUN 13 06/28/2023   NA 138 06/28/2023   K 4.3 06/28/2023   CL 100 06/28/2023   CO2 29 06/28/2023    Lab Results  Component Value Date   CHOL 177 03/13/2023   HDL 49.20 03/13/2023   LDLCALC 94 03/13/2023   LDLDIRECT 220.0 09/16/2019   TRIG 167.0 (H) 03/13/2023   CHOLHDL 4 03/13/2023    Medications Reviewed Today     Reviewed by Bonita Quin, RPH (Pharmacist) on 07/04/23 at 1121  Med List Status: <None>   Medication Order Taking? Sig Documenting Provider Last Dose Status Informant  0.9 %  sodium chloride infusion [id number]   Pyrtle, Carie Caddy, MD  Active   acetaminophen (TYLENOL) 500 MG tablet [id number] Yes Take 2 tablets (1,000 mg total) by mouth 4 (four) times daily.  Patient taking differently: Take 1,000 mg by mouth 3 (three) times daily.   Alwyn Ren, MD Taking Active Self, Pharmacy Records  aspirin EC 81 MG tablet [id number] Yes Take 81 mg by mouth daily. Swallow whole. [provider] Taking Active Self, Pharmacy Records  carvedilol (COREG) 6.25 MG tablet [id number] Yes Take 1 tablet (6.25 mg total) by mouth 2 (two) times daily with a meal. Etta Grandchild, MD Taking Active Self, Pharmacy Records  cetirizine (ZYRTEC) 10 MG tablet [id number] Yes Take 10 mg by mouth daily. [provider] Taking Active Self, Pharmacy Records  Cholecalciferol (VITAMIN D) 50 MCG (2000 UT) CAPS [id number] Yes Take 1 capsule by mouth daily. [provider] Taking Active  Self, Pharmacy Records  ezetimibe (ZETIA) 10 MG tablet [id number] Yes TAKE 1 TABLET(10 MG) BY MOUTH DAILY Etta Grandchild, MD Taking Active Self, Pharmacy Records  famotidine (PEPCID) 40 MG tablet [id number] Yes TAKE 1 TABLET(40 MG) BY MOUTH AT BEDTIME Etta Grandchild, MD Taking Active Self, Pharmacy Records  Ferric Maltol (ACCRUFER) 30 MG CAPS [id number] Yes Take 1 capsule (30 mg total) by mouth  in the morning and at bedtime. Etta Grandchild, MD Taking Active Self, Pharmacy Records  fluticasone [facility]) 50 MCG/ACT nasal spray [id number] Yes Place 2 sprays into both nostrils daily. Etta Grandchild, MD Taking Active Self, Pharmacy Records  hydrALAZINE (APRESOLINE) 25 MG tablet [id number] Yes TAKE 1 TABLET(25 MG) BY MOUTH THREE TIMES DAILY Etta Grandchild, MD Taking Active Self, Pharmacy Records  montelukast (SINGULAIR) 10 MG tablet [id number] Yes Take 1 tablet (10 mg total) by mouth at bedtime. Etta Grandchild, MD Taking Active Self, Pharmacy Records  pantoprazole (PROTONIX) 20 MG tablet [id number] Yes TAKE 1 TABLET(20 MG) BY MOUTH DAILY Etta Grandchild, MD Taking Active Self, Pharmacy Records  pravastatin (PRAVACHOL) 40 MG tablet [id number] Yes TAKE 1 TABLET(40 MG) BY MOUTH DAILY Etta Grandchild, MD Taking Active Self, Pharmacy Records  tiZANidine (ZANAFLEX) 2 MG tablet [id number] Yes Take 1 tablet (2 mg total) by mouth every 8 (eight) hours as needed for muscle spasms. Etta Grandchild, MD Taking Active Self, Pharmacy Records  traMADol Janean Sark) 50 MG tablet [id number] Yes Take 1 tablet (50 mg total) by mouth every 6 (six) hours as needed. Etta Grandchild, MD Taking Active Self, Pharmacy Records  triamcinolone cream (KENALOG) 0.5 % [id number] Yes Apply 1 Application topically as needed (Dermatitis). [provider] Taking Active Self, Pharmacy Records  triamterene-hydrochlorothiazide (DYAZIDE) 37.5-25 MG capsule [id number] Yes Take 1 each (1 capsule total) by mouth daily. Etta Grandchild, MD Taking Active Self, Pharmacy Records              Assessment/Plan:   Hypertension: - Currently over controlled, BP goal <130/80 without hypotension - Reviewed appropriate blood pressure monitoring technique and reviewed goal blood pressure. Recommended to check home blood pressure and heart rate. Reviewed that wrist cuff is not as accurate and arm cuff if preferred. -Recommend to reduce  hydralazine to BID. Plan to d/c hydralazine if BP remains low.  -Need BMP recheck, bump in Scr after triamterene/hydrochlorothiazide initiation, K wnl. She does have upcoming oncology appts, they may be planning to check BMP    Follow Up Plan: 07/18/23  Arbutus Leas, PharmD, BCPS, CPP Clinical Pharmacist Practitioner [facility] [phone]

## 2023-07-05 LAB — SURGICAL PATHOLOGY

## 2023-07-05 MED ORDER — HYDRALAZINE HCL 25 MG PO TABS: ORAL_TABLET | ORAL | Status: DC

## 2023-07-05 NOTE — Patient Instructions (Signed)
It was a pleasure speaking with you today!  Reduce hydralazine to twice daily. Keep checking home blood pressures.  Feel free to call with any questions or concerns!  Arbutus Leas, PharmD, BCPS, CPP Clinical Pharmacist Practitioner [facility] [phone]

## 2023-07-08 ENCOUNTER — Encounter: Payer: Self-pay | Admitting: Plastic Surgery

## 2023-07-08 ENCOUNTER — Ambulatory Visit: Payer: Medicare Other | Admitting: Plastic Surgery

## 2023-07-08 ENCOUNTER — Telehealth: Payer: Self-pay | Admitting: *Deleted

## 2023-07-08 DIAGNOSIS — N62 Hypertrophy of breast: Secondary | ICD-10-CM | POA: Diagnosis not present

## 2023-07-08 DIAGNOSIS — C50212 Malignant neoplasm of upper-inner quadrant of left female breast: Secondary | ICD-10-CM | POA: Diagnosis not present

## 2023-07-08 DIAGNOSIS — Z170 Estrogen receptor positive status [ER+]: Secondary | ICD-10-CM | POA: Diagnosis not present

## 2023-07-08 NOTE — Telephone Encounter (Signed)
 Received order for Oncotype Testing per Dr. Al Pimple. Requisition sent to pathology

## 2023-07-08 NOTE — Progress Notes (Signed)
 [name] returns today for follow-up after her left breast lumpectomy 1 week ago.  She is doing well with minimal discomfort from the procedure.  She has minimal ecchymoses.  He does have a contour defect in the left upper quadrant.  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  5 sentinel nodes were removed all of which were negative for malignancy.  Still being evaluated for adjuvant therapy.  She may require radiation however she states that this has not been determined conclusively.  She would be a good candidate for an oncoplastic breast reduction to correct the contour defect.  I would like to wait until she has at least a month to 6 weeks out from her procedure prior to performing the reduction.  I discussed breast reductions again with her today I showed her the location of the incisions and we discussed the unpredictable nature of scarring and wound healing.  We discussed the risks of bleeding, infection, and seroma formation.  We discussed the risk of nipple loss due to nipple ischemia.  She is interested in proceeding.  I would like to see her again in approximately 3 weeks to ensure that her lumpectomy site has completely healed.  Will go ahead and submit her for an oncoplastic breast reduction but will wait to perform this for at least 4 to 5 weeks.  30 minutes spent reviewing patient chart, examining patient, discussing the procedure with the patient, coordinating care, and documenting.

## 2023-07-15 DIAGNOSIS — C50212 Malignant neoplasm of upper-inner quadrant of left female breast: Secondary | ICD-10-CM | POA: Diagnosis not present

## 2023-07-15 DIAGNOSIS — Z170 Estrogen receptor positive status [ER+]: Secondary | ICD-10-CM | POA: Diagnosis not present

## 2023-07-16 ENCOUNTER — Encounter (HOSPITAL_COMMUNITY): Payer: Self-pay

## 2023-07-18 ENCOUNTER — Other Ambulatory Visit: Payer: Medicare Other | Admitting: Pharmacist

## 2023-07-18 DIAGNOSIS — I10 Essential (primary) hypertension: Secondary | ICD-10-CM

## 2023-07-18 NOTE — Progress Notes (Signed)
 07/18/2023 Name: Kayla Lowe MRN: [id number] DOB: May 31, 1955  Chief Complaint  Patient presents with   Hypertension   Medication Management    Kayla Lowe is a 68 y.o. year old female who presented for a telephone visit.   They were referred to the pharmacist by their PCP for assistance in managing hypertension.   Subjective:  Care Team: Primary Care Provider: Etta Grandchild, MD ; Next Scheduled Visit: 09/16/23   Medication Access/Adherence  Current Pharmacy:  [facility] #[id number] Gin[place] Otto, [place] - [address] AT [facility] OF [facility] [address] [place] [address] Phone: [phone] Fax: [phone]  [facility] [place], [place] - [address] [phone] [place] [address] Phone: [phone] Fax: [phone]   Patient reports affordability concerns with their medications: No  Patient reports access/transportation concerns to their pharmacy: No  Patient reports adherence concerns with their medications:  No    Pt has a lot going on right now with  2 recent cancer diagnoses. Just had lumpectomy surgery yesterday and has more follow up with plastic surgery and oncology upcoming.   Hypertension:  Current medications: carvedilol 6.25 mg twice daily, hydralazine 25 mg BID, triamterene/hydrochlorothiazide 37/25 mg daily (started 06/19/23) Medications previously tried: intolerance to losartan and lisinopril  Patient does not have a validated, automated, upper arm home BP cuff. She has been using a wrist cuff. She reports BP have been low for the past 2 weeks. Current blood pressure readings:  2/28 AM 164/85 (79) before meds, 12 PM 131/82 (75), 2 PM 112/66 (73), 4 PM 121/95 (76), 9 PM 132/89 (69) 3/1 before meds 116/69 (82), 6 PM 116/70 (79), 9 PM 116/62 (75) 3/2 before meds 140/79 (82), 11 AM 117/69 (73), 7 PM 109/71 (76) - changed hydralazine to bedtime 3/3 before meds 151/85 (87),  11 AM 132/69 (71), 3 PM 108/60 (76), 9 PM 107/69 (66) 3/4 before meds 119/71 (78), 1 PM 103/65 (79), 5 PM 104/64 (74) 3/5 before 145/80, 7 PM 116/64 (73) 3/6 before meds 144/75 (77)  Avg 131/71 (73 HR)  Patient denies hypotensive s/sx including dizziness, lightheadedness.  She notes she can tell when BP is low but does not report dizziness.    Objective:  Lab Results  Component Value Date   HGBA1C 5.4 08/31/2019    Lab Results  Component Value Date   CREATININE 1.12 (H) 06/28/2023   BUN 13 06/28/2023   NA 138 06/28/2023   K 4.3 06/28/2023   CL 100 06/28/2023   CO2 29 06/28/2023    Lab Results  Component Value Date   CHOL 177 03/13/2023   HDL 49.20 03/13/2023   LDLCALC 94 03/13/2023   LDLDIRECT 220.0 09/16/2019   TRIG 167.0 (H) 03/13/2023   CHOLHDL 4 03/13/2023    Medications Reviewed Today   Medications were not reviewed in this encounter       Assessment/Plan:   Hypertension: - Currently over controlled, BP goal <130/80 without hypotension - Reviewed appropriate blood pressure monitoring technique and reviewed goal blood pressure. Recommended to check home blood pressure and heart rate.  - Consider d/c hydralazine and start amlodipine 2.5 mg however pt prefers to not change. Given everything upcoming with surgery and oncologist appts, will wait on any more medication changes at this time. -Need BMP recheck, bump in Scr after triamterene/hydrochlorothiazide initiation, K wnl. She does have upcoming oncology appts, they may be planning to check BMP  Follow Up Plan: 4/15  Arbutus Leas, PharmD, BCPS, CPP Clinical Pharmacist Practitioner [facility] [phone]

## 2023-07-19 ENCOUNTER — Telehealth: Payer: Self-pay | Admitting: Hematology and Oncology

## 2023-07-19 NOTE — Telephone Encounter (Signed)
 Spoke with patient confirming upcoming appointment

## 2023-07-22 ENCOUNTER — Ambulatory Visit: Payer: Medicare Other | Admitting: Hematology and Oncology

## 2023-07-22 ENCOUNTER — Encounter: Payer: Self-pay | Admitting: *Deleted

## 2023-07-22 ENCOUNTER — Inpatient Hospital Stay: Attending: Hematology and Oncology | Admitting: Hematology and Oncology

## 2023-07-22 VITALS — BP 121/67 | HR 71 | Temp 97.7°F | Resp 17 | Wt 196.3 lb

## 2023-07-22 DIAGNOSIS — Z170 Estrogen receptor positive status [ER+]: Secondary | ICD-10-CM | POA: Diagnosis not present

## 2023-07-22 DIAGNOSIS — C8580 Other specified types of non-Hodgkin lymphoma, unspecified site: Secondary | ICD-10-CM | POA: Diagnosis not present

## 2023-07-22 DIAGNOSIS — C8309 Small cell B-cell lymphoma, extranodal and solid organ sites: Secondary | ICD-10-CM | POA: Diagnosis not present

## 2023-07-22 DIAGNOSIS — C50212 Malignant neoplasm of upper-inner quadrant of left female breast: Secondary | ICD-10-CM | POA: Insufficient documentation

## 2023-07-22 DIAGNOSIS — C50912 Malignant neoplasm of unspecified site of left female breast: Secondary | ICD-10-CM

## 2023-07-22 DIAGNOSIS — Z803 Family history of malignant neoplasm of breast: Secondary | ICD-10-CM | POA: Insufficient documentation

## 2023-07-22 NOTE — Progress Notes (Signed)
 [facility] FOLLOW UP NOTE  Patient Care Team: Etta Grandchild, MD as PCP - General (Internal Medicine) Norva Pavlov, OD as Consulting Physician (Optometry) Rachel Moulds, MD as Consulting Physician (Hematology and Oncology) Pershing Proud, RN as Oncology Nurse Navigator Abigail Miyamoto, MD as Consulting Physician (General Surgery) Donnelly Angelica, RN as Oncology Nurse Navigator Bonita Quin, 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   Breast Cancer Two small areas in the left breast at 10 o'clock position, one measuring 1 cm and the other 7 mm. Both biopsied and confirmed as breast cancer. Estrogen and progesterone positive, HER2 status negative.  -She is s/p surgery, neg margins, neg LN and is here to review oncotype dx testing -Oncotype score of 28 indicates chemotherapy benefits. Asymptomatic post-surgery with clear lymph nodes. Chemotherapy recommended to address potential microscopic metastatic disease. - Discuss chemotherapy options: docetaxel and cyclophosphamide every 21 days for four cycles. - Coordinate with Dr. Ladona Ridgel and Dr. Magnus Ivan for chemotherapy timing with breast surgery on April 4. Ok to start about 2 weeks after surgery per Dr Ladona Ridgel. - Consider cold cap to reduce hair loss. - Monitor blood counts, liver function, and kidney function during chemotherapy. - Administer post-chemotherapy GCSF to reduce infection risk. - Discuss potential chemotherapy side effects: fatigue, nausea, constipation, diarrhea, neuropathy, hair loss etc.  -Post chemo, she will need adj radiation followed by anti estrogens.   Marginal Zone Lymphoma  She originally presented to the emergency room in September with pain in the left upper quadrant and was  found to have small bowel obstruction. She had CT abdomen pelvis which showed small bowel obstruction with transition point in the central small bowel mesentery suspicious for adhesion. She had ex lap with small bowel resection and this showed a large small bowel mass which was not obstructing the small bowel lumen. 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 Since her small bowel surgery, she remains asymptomatic, wound has nearly healed. Most recent PET/CT with no signs of tracer avid lymphadenopathy, postop changes noted from the previous small bowel resection, increased uptake along the resection site is nonspecific could be physiological or postsurgical, intense uptake is identified localizing to the descending duodenum where there is circumferential wall thickening identified cannot exclude Endoscopy showed duodenal and gastric biopsies showed Marginal zone lymphoma. Given need for chemotherapy for [facility], and since most marginal zone lymphomas are indolent, we will monitor this for now.  Gynecological Concern Activity noted in the vagina on PET scan, likely unrelated to lymphoma or breast cancer. No suspicion of malignancy at this time.  Scheduled March 20 th  Follow-up Coordination with specialists for chemotherapy timing and follow-ups. - Contact her today with updates from Dr. Ladona Ridgel and Dr. Magnus Ivan regarding chemotherapy timing. - Schedule follow-up appointments as needed.   HISTORY OF PRESENTING ILLNESS:  Kayla Lowe 68 y.o. female is here because of marginal zone lymphoma of small bowel.  Oncology History  Malignant neoplasm of upper-inner quadrant of left breast in female, estrogen receptor positive (HCC)  06/14/2023 Initial Diagnosis   Malignant neoplasm of upper-inner quadrant of left  breast in female, estrogen receptor positive (HCC)  06/14/2023 Cancer Staging   Staging form: Breast, AJCC 8th Edition - Clinical stage from 06/14/2023: Stage IA (cT1, cN0, cM0, G3, ER+, PR+, HER2-) - Signed by Rachel Moulds, MD on 06/17/2023 Stage prefix: Initial diagnosis Method of lymph node assessment: Clinical Histologic grading system: 3 grade system    Discussed the use of AI scribe software for clinical note transcription with the patient, who gave verbal consent to proceed.  History of Present Illness    The patient, with breast cancer and marginal zone lymphoma, presents for follow-up and management.  She is recovering from recent breast surgery, experiencing ongoing soreness and tenderness, with daily improvement.  She is concerned about potential side effects of chemotherapy, including fatigue, nausea, constipation, diarrhea, neuropathy, and hair loss.  She feels overwhelmed by the number of medical appointments and the information she is receiving.  She had endoscopy, biopsies confirmed marginal zone lymphoma.  Rest of the pertinent 10 point ROS reviewed and neg.  MEDICAL HISTORY:  Past Medical History:  Diagnosis Date   Anemia    iron deficiency   Breast cancer, left (HCC)    x2   GERD (gastroesophageal reflux disease)    History of kidney stones    Hyperlipidemia    Hypertension    Osteoarthritis     SURGICAL HISTORY: Past Surgical History:  Procedure Laterality Date   ABDOMINAL HYSTERECTOMY  1987   BREAST BIOPSY Left 05/28/2023   [place] LT BREAST BX W LOC DEV EA ADD LESION IMG BX SPEC US GUIDE 05/28/2023 [facility]   BREAST BIOPSY Left 05/28/2023   [place] LT BREAST BX W LOC DEV 1ST LESION IMG BX SPEC US GUIDE 05/28/2023 [facility]   BREAST BIOPSY Left 07/02/2023   [place] LT RADIOACTIVE SEED EA ADD LESION 07/02/2023 [facility]   BREAST BIOPSY Left 07/02/2023   [place] LT RADIOACTIVE SEED LOC 07/02/2023 [facility]   BREAST LUMPECTOMY WITH  RADIOACTIVE SEED AND SENTINEL LYMPH NODE BIOPSY Left 07/03/2023   Procedure: RADIOACTIVE SEED GUIDED LEFT BREAST LUMPECTOMY x2 AND LEFT SENTINEL NODE BIOPSY;  Surgeon: Abigail Miyamoto, MD;  Location: [facility] OR;  Service: General;  Laterality: Left;  LMA   LAPAROTOMY N/A 02/04/2023   Procedure: EXPLORATORY LAPAROTOMY, WITH SMALL BOWEL RESECTION;  Surgeon: Abigail Miyamoto, MD;  Location: [facility]S;  Service: General;  Laterality: N/A;    SOCIAL HISTORY: Social History   Socioeconomic History   Marital status: Legally Separated    Spouse name: Not on file   Number of children: 3   Years of education: Not on file   Highest education level: Not on file  Occupational History   Occupation: nanny  Tobacco Use   Smoking status: Never   Smokeless tobacco: Never  Vaping Use   Vaping status: Never Used  Substance and Sexual Activity   Alcohol use: No   Drug use: No   Sexual activity: Not Currently  Other Topics Concern   Not on file  Social History Narrative   Not on file   Social Drivers of Health   Financial Resource Strain: Low Risk  (03/22/2022)   Overall Financial Resource Strain (CARDIA)    Difficulty of Paying Living Expenses: Not hard at all  Food Insecurity: Food Insecurity Present (06/14/2023)   Hunger Vital Sign    Worried About Radiation protection practitioner of Food in the Last Year: Sometimes true    Ran Out of Food in the Last Year: Sometimes true  Transportation Needs: No Transportation Needs (06/14/2023)   PRAPARE - Transportation  Lack of Transportation (Medical): No    Lack of Transportation (Non-Medical): No  Physical Activity: Inactive (03/22/2022)   Exercise Vital Sign    Days of Exercise per Week: 0 days    Minutes of Exercise per Session: 0 min  Stress: No Stress Concern Present (03/22/2022)   [employer] of Occupational Health - Occupational Stress Questionnaire    Feeling of Stress : Not at all  Social Connections: Moderately Integrated (03/22/2022)   Social Connection and  Isolation Panel [NHANES]    Frequency of Communication with Friends and Family: More than three times a week    Frequency of Social Gatherings with Friends and Family: More than three times a week    Attends Religious Services: More than 4 times per year    Active Member of [employer] or Organizations: No    Attends Engineer, structural: More than 4 times per year    Marital Status: Separated  Intimate Partner Violence: Not At Risk (06/14/2023)   Humiliation, Afraid, Rape, and Kick questionnaire    Fear of Current or Ex-Partner: No    Emotionally Abused: No    Physically Abused: No    Sexually Abused: No    FAMILY HISTORY: Family History  Problem Relation Age of Onset   Cancer Father    Hypertension Brother    Diabetes Brother    Diabetes Paternal Grandfather    Diabetes Paternal Uncle    Breast cancer Paternal Aunt    Colon cancer Neg Hx    Hypercalcemia Neg Hx     ALLERGIES:  is allergic to cozaar [losartan potassium], lisinopril, and statins.  MEDICATIONS:  Current Outpatient Medications  Medication Sig Dispense Refill   acetaminophen (TYLENOL) 500 MG tablet Take 2 tablets (1,000 mg total) by mouth 4 (four) times daily. (Patient taking differently: Take 1,000 mg by mouth 3 (three) times daily.) 30 tablet 0   aspirin EC 81 MG tablet Take 81 mg by mouth daily. Swallow whole.     carvedilol (COREG) 6.25 MG tablet Take 1 tablet (6.25 mg total) by mouth 2 (two) times daily with a meal. 180 tablet 0   cetirizine (ZYRTEC) 10 MG tablet Take 10 mg by mouth daily.     Cholecalciferol (VITAMIN D) 50 MCG (2000 UT) CAPS Take 1 capsule by mouth daily.     ezetimibe (ZETIA) 10 MG tablet TAKE 1 TABLET(10 MG) BY MOUTH DAILY 90 tablet 0   famotidine (PEPCID) 40 MG tablet TAKE 1 TABLET(40 MG) BY MOUTH AT BEDTIME 90 tablet 1   Ferric Maltol (ACCRUFER) 30 MG CAPS Take 1 capsule (30 mg total) by mouth in the morning and at bedtime. 180 capsule 0   fluticasone (FLONASE) 50 MCG/ACT nasal spray  Place 2 sprays into both nostrils daily. 48 g 1   hydrALAZINE (APRESOLINE) 25 MG tablet TAKE 1 TABLET(25 MG) BY MOUTH TWO TIMES DAILY     montelukast (SINGULAIR) 10 MG tablet Take 1 tablet (10 mg total) by mouth at bedtime. 90 tablet 1   pantoprazole (PROTONIX) 20 MG tablet TAKE 1 TABLET(20 MG) BY MOUTH DAILY 90 tablet 1   pravastatin (PRAVACHOL) 40 MG tablet TAKE 1 TABLET(40 MG) BY MOUTH DAILY 90 tablet 1   tiZANidine (ZANAFLEX) 2 MG tablet Take 1 tablet (2 mg total) by mouth every 8 (eight) hours as needed for muscle spasms. 270 tablet 0   traMADol (ULTRAM) 50 MG tablet Take 1 tablet (50 mg total) by mouth every 6 (six) hours as needed. 270 tablet 0  triamcinolone cream (KENALOG) 0.5 % Apply 1 Application topically as needed (Dermatitis).     triamterene-hydrochlorothiazide (DYAZIDE) 37.5-25 MG capsule Take 1 each (1 capsule total) by mouth daily. 90 capsule 0   Current Facility-Administered Medications  Medication Dose Route Frequency Provider Last Rate Last Admin   0.9 %  sodium chloride infusion  500 mL Intravenous Once Pyrtle, Carie Caddy, MD          PHYSICAL EXAMINATION: ECOG PERFORMANCE STATUS: 2 - Symptomatic, <50% confined to bed  Vitals:   07/22/23 0946  BP: 121/67  Pulse: 71  Resp: 17  Temp: 97.7 F (36.5 C)  SpO2: 100%    Filed Weights   07/22/23 0946  Weight: 196 lb 4.8 oz (89 kg)    GENERAL:alert, no distress and comfortable Left breast and axillary wound healing well.  LABORATORY DATA:  I have reviewed the data as listed Lab Results  Component Value Date   WBC 5.0 06/28/2023   HGB 14.4 06/28/2023   HCT 44.8 06/28/2023   MCV 96.3 06/28/2023   PLT 405 (H) 06/28/2023     Chemistry      Component Value Date/Time   NA 138 06/28/2023 0900   NA 145 (H) 07/29/2018 1112   K 4.3 06/28/2023 0900   CL 100 06/28/2023 0900   CO2 29 06/28/2023 0900   BUN 13 06/28/2023 0900   BUN 8 07/29/2018 1112   CREATININE 1.12 (H) 06/28/2023 0900   CREATININE 0.85  04/18/2023 1039   CREATININE 0.87 08/03/2015 0942      Component Value Date/Time   CALCIUM 11.1 (H) 06/28/2023 0900   ALKPHOS 100 04/18/2023 1039   AST 11 (L) 04/18/2023 1039   ALT 7 04/18/2023 1039   BILITOT 0.3 04/18/2023 1039       RADIOGRAPHIC STUDIES: I have personally reviewed the radiological images as listed and agreed with the findings in the report. MM Breast Surgical Specimen Result Date: 07/03/2023 CLINICAL DATA:  Specimen radiograph status post left breast lumpectomy. EXAM: SPECIMEN RADIOGRAPH OF THE LEFT BREAST COMPARISON:  Previous exam(s). FINDINGS: Status post excision of the left breast. The 2 radioactive seeds and 2 biopsy marker clips are present and completely intact within the specimen. IMPRESSION: Specimen radiograph of the left breast. Electronically Signed   By: Frederico Hamman M.D.   On: 07/03/2023 09:23   [place] LT RADIOACTIVE SEED EA ADD LESION Result Date: 07/02/2023 CLINICAL DATA:  68 year old female presenting for seed localization prior to left breast lumpectomy. Patient has newly diagnosed left breast invasive mammary carcinoma x2. EXAM: MAMMOGRAPHIC GUIDED RADIOACTIVE SEED LOCALIZATION OF THE LEFT BREAST x2 COMPARISON:  Previous exam(s). FINDINGS: Patient presents for radioactive seed localization prior to left breast lumpectomy. I met with the patient and we discussed the procedure of seed localization including benefits and alternatives. We discussed the high likelihood of a successful procedure. We discussed the risks of the procedure including infection, bleeding, tissue injury and further surgery. We discussed the low dose of radioactivity involved in the procedure. Informed, written consent was given. The usual time-out protocol was performed immediately prior to the procedure. Using mammographic guidance, sterile technique, 1% lidocaine and an I-125 radioactive seed, the deeper mass in the left breast at 10 o'clock 19 cm from the nipple was localized using a  medial approach. Follow-up survey of the patient confirms presence of the radioactive seed. Order number of I-125 seed:  [id number]. Total activity: 0.259 mCi reference Date: 05/23/2023 Using mammographic guidance, sterile technique, 1% lidocaine and an I-125  radioactive seed, the more superficial mass in the left breast at 10 o'clock 19 cm from the nipple was localized using a medial approach. Follow-up survey of the patient confirms presence of the radioactive seed. Order number of I-125 seed:  [id number]. Total activity: 0.259 mCi reference Date: 05/23/2023 Post procedure mammogram not performed as the masses were mammographically occult. The patient tolerated the procedure well and was released from the [facility]. She was given instructions regarding seed removal. IMPRESSION: Radioactive seed localization left breast x 2. No apparent complications. Electronically Signed   By: Emmaline Kluver M.D.   On: 07/02/2023 09:54   [place] LT RADIOACTIVE SEED LOC Result Date: 07/02/2023 CLINICAL DATA:  68 year old female presenting for seed localization prior to left breast lumpectomy. Patient has newly diagnosed left breast invasive mammary carcinoma x2. EXAM: MAMMOGRAPHIC GUIDED RADIOACTIVE SEED LOCALIZATION OF THE LEFT BREAST x2 COMPARISON:  Previous exam(s). FINDINGS: Patient presents for radioactive seed localization prior to left breast lumpectomy. I met with the patient and we discussed the procedure of seed localization including benefits and alternatives. We discussed the high likelihood of a successful procedure. We discussed the risks of the procedure including infection, bleeding, tissue injury and further surgery. We discussed the low dose of radioactivity involved in the procedure. Informed, written consent was given. The usual time-out protocol was performed immediately prior to the procedure. Using mammographic guidance, sterile technique, 1% lidocaine and an I-125 radioactive seed, the deeper mass in the  left breast at 10 o'clock 19 cm from the nipple was localized using a medial approach. Follow-up survey of the patient confirms presence of the radioactive seed. Order number of I-125 seed:  [id number]. Total activity: 0.259 mCi reference Date: 05/23/2023 Using mammographic guidance, sterile technique, 1% lidocaine and an I-125 radioactive seed, the more superficial mass in the left breast at 10 o'clock 19 cm from the nipple was localized using a medial approach. Follow-up survey of the patient confirms presence of the radioactive seed. Order number of I-125 seed:  [id number]. Total activity: 0.259 mCi reference Date: 05/23/2023 Post procedure mammogram not performed as the masses were mammographically occult. The patient tolerated the procedure well and was released from the [facility] Center. She was given instructions regarding seed removal. IMPRESSION: Radioactive seed localization left breast x 2. No apparent complications. Electronically Signed   By: Emmaline Kluver M.D.   On: 07/02/2023 09:54    All questions were answered. The patient knows to call the clinic with any problems, questions or concerns.     Rachel Moulds, MD 07/22/2023 9:52 AM

## 2023-07-22 NOTE — Progress Notes (Signed)
 START ON PATHWAY REGIMEN - Breast     A cycle is every 21 days:     Cyclophosphamide      Docetaxel   **Always confirm dose/schedule in your pharmacy ordering system**  Patient Characteristics: Postoperative without Neoadjuvant Therapy, M0 (Pathologic Staging), Invasive Disease, Adjuvant Therapy, HER2 Negative, ER Positive, Node Negative, pT2 or Higher, pN0, Oncotype High Risk (? 26) Therapeutic Status: Postoperative without Neoadjuvant Therapy, M0 (Pathologic Staging) AJCC Grade: G3 AJCC N Category: pN0 AJCC M Category: cM0 ER Status: Positive (+) AJCC 8 Stage Grouping: IB HER2 Status: Negative (-) Oncotype Dx Recurrence Score: 28 AJCC T Category: pT2 PR Status: Positive (+) Has this patient completed genomic testing<= Yes - Oncotype DX(R) Intent of Therapy: Curative Intent, Discussed with Patient

## 2023-07-23 ENCOUNTER — Other Ambulatory Visit: Payer: Self-pay

## 2023-07-23 NOTE — Therapy (Signed)
 OUTPATIENT PHYSICAL THERAPY BREAST CANCER POST OP FOLLOW UP   Patient Name: Kayla Lowe MRN: [id number] DOB:1956-05-10, 68 y.o., female Today's Date: 07/24/2023  END OF SESSION:  PT End of Session - 07/24/23 0834     Visit Number 2    Number of Visits 2    Date for PT Re-Evaluation 07/30/23    PT Start Time 0758    PT Stop Time 0833    PT Time Calculation (min) 35 min    Activity Tolerance Patient tolerated treatment well    Behavior During Therapy WFL for tasks assessed/performed             Past Medical History:  Diagnosis Date   Anemia    iron deficiency   Breast cancer, left (HCC)    x2   GERD (gastroesophageal reflux disease)    History of kidney stones    Hyperlipidemia    Hypertension    Osteoarthritis    Past Surgical History:  Procedure Laterality Date   ABDOMINAL HYSTERECTOMY  1987   BREAST BIOPSY Left 05/28/2023   [place] LT BREAST BX W LOC DEV EA ADD LESION IMG BX SPEC US GUIDE 05/28/2023 [facility]   BREAST BIOPSY Left 05/28/2023   [place] LT BREAST BX W LOC DEV 1ST LESION IMG BX SPEC US GUIDE 05/28/2023 [facility]   BREAST BIOPSY Left 07/02/2023   [place] LT RADIOACTIVE SEED EA ADD LESION 07/02/2023 [facility]   BREAST BIOPSY Left 07/02/2023   [place] LT RADIOACTIVE SEED LOC 07/02/2023 [facility]   BREAST LUMPECTOMY WITH RADIOACTIVE SEED AND SENTINEL LYMPH NODE BIOPSY Left 07/03/2023   Procedure: RADIOACTIVE SEED GUIDED LEFT BREAST LUMPECTOMY x2 AND LEFT SENTINEL NODE BIOPSY;  Surgeon: Abigail Miyamoto, MD;  Location: [facility];  Service: General;  Laterality: Left;  LMA   LAPAROTOMY N/A 02/04/2023   Procedure: EXPLORATORY LAPAROTOMY, WITH SMALL BOWEL RESECTION;  Surgeon: Abigail Miyamoto, MD;  Location: [facility];  Service: General;  Laterality: N/A;   Patient Active Problem List   Diagnosis Date Noted   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   Malignant neoplasm of upper-inner quadrant of left breast in female, estrogen receptor positive  (HCC) 06/14/2023   Iron deficiency anemia due to chronic blood loss 03/13/2023   Nocturnal muscle cramps 10/17/2022   LVH (left ventricular hypertrophy) due to hypertensive disease, without heart failure 01/17/2022   Encounter for general adult medical examination with abnormal findings 01/09/2021   Visit for screening mammogram 07/12/2020   Primary osteoarthritis of both knees 07/11/2020   Intrinsic eczema 03/14/2020   Mild persistent asthma without complication 03/14/2020   Vitamin D deficiency disease 09/18/2019   Familial hypercholesteremia 09/16/2019   Morton's neuroma of left foot 07/20/2019   Gastroesophageal reflux disease without esophagitis 02/09/2019   Osteopenia of neck of right femur 02/09/2019   Seasonal allergic rhinitis due to pollen 08/12/2017   Elevated rheumatoid factor 02/06/2016   Dyslipidemia, goal LDL below 130 09/10/2013    PCP: Sanda Linger, MD  REFERRING PROVIDER: Abigail Miyamoto, MD  REFERRING DIAG:  Diagnosis  [phone] (ICD-10-CM) - Invasive ductal carcinoma of breast, left (HCC)    THERAPY DIAG:  Malignant neoplasm of upper-inner quadrant of left breast in female, estrogen receptor positive (HCC)  Aftercare following surgery for neoplasm  At risk for lymphedema  Rationale for Evaluation and Treatment: Rehabilitation  ONSET DATE: 07/03/23  SUBJECTIVE:  SUBJECTIVE STATEMENT: I am doing pretty well.  The breast incision is a little sore.  I will get compression after the reduction.  They told me to wait.    PERTINENT HISTORY:  Pt has two small areas in the left breast measuring 1cm and 7mm. ER/PR positive, HER2 negative. lumpectomy with SLNB on 07/03/23 with 5 negative nodes removed.  with oncotype of 28 - will be starting chemotherapy with TC every 21 days x 4  and  then pt will have radiation. Also new dx of marginal zone lymphoma - just monitoring this.  Breast reduction scheduled for 08/16/23.   PATIENT GOALS:  Reassess how my recovery is going related to arm function, pain, and swelling.  PAIN:  Are you having pain? 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  No edema  POSTURE:  WNL  LYMPHEDEMA ASSESSMENT:   UPPER EXTREMITY AROM/PROM:   A/PROM RIGHT   eval   07/24/23  Shoulder extension 45   Shoulder flexion 145   Shoulder abduction 150   Shoulder internal rotation 70   Shoulder external rotation 95                           (Blank rows = not tested)   A/PROM LEFT   eval 07/24/23  Shoulder extension 58 60  Shoulder flexion 140 150  Shoulder abduction 150 145  Shoulder internal rotation 70   Shoulder external rotation 95 90                          (Blank rows = not tested)   CERVICAL AROM: All within normal limits:    UPPER EXTREMITY STRENGTH: 4+/5 bil    LYMPHEDEMA ASSESSMENTS (in cm):    LANDMARK RIGHT   eval  10 cm proximal to olecranon process 34  Olecranon process 29.4  10 cm proximal to ulnar styloid process 24.3  Just proximal to ulnar styloid process 18.6  Across hand at thumb web space 20.7  At base of 2nd digit 7.2  (Blank rows = not tested)   LANDMARK LEFT   eval  10 cm proximal to olecranon process 33.5  Olecranon process 29.3  10 cm proximal to ulnar styloid process 24.6  Just proximal to ulnar styloid process 18.2  Across hand at thumb web space 20.8  At base of 2nd digit 7.0   (Blank rows = not tested)  PATIENT EDUCATION:  Education details: post op lymphedema risk, post op instruction per below, included breast reduction education with tip sheet and exercises to start after the drain is removed.   Person educated: Patient Education method: Explanation, Demonstration, and Handouts Education comprehension: verbalized understanding  HOME EXERCISE PROGRAM: Reviewed previously given post op HEP. Access Code: [email or web address] URL: [email or web address] Date: 07/24/2023 Prepared by: Gwenevere Abbot  Program Notes Here are the suggested stretches for after breast reduction surgery. Remember to avoid significant pain.   Exercises - Correct Seated Posture  -  Seated Diaphragmatic Breathing  - 3 x daily - 7 x weekly - 10 reps - Shoulder External Rotation and Scapular Retraction  - 3 x daily - 7 x weekly - 5 reps - 3 seconds hold - Seated Chest Stretch with Hands Behind Head  - 3 x daily - 7 x weekly - 3 reps - 5 second hold - Standing Shoulder Flexion Wall Walk  - 3 x daily - 7 x weekly - 3 reps - 3 seconds hold - Standing Shoulder Abduction Finger Walk at Wall  - 3 x daily - 7 x weekly - 3 reps - 3 seconds hold  ASSESSMENT:  CLINICAL IMPRESSION: Pt is doing well after lumpectomy.  She is now going to have a reduction bilaterally and then chemotherapy and radiation.  She has no PT needs at this time except for continued SOZO screenings.  She was educated on exercise after breast reduction and throughout chemotherapy and knows to return at any point after her next surgery or other treatment.    Pt will benefit from skilled therapeutic intervention to improve on the following deficits: Decreased knowledge of precautions, impaired UE functional use, pain, decreased ROM, postural dysfunction.   PT treatment/interventions: ADL/Self care home management, [phone]- PT Re-evaluation and [phone]- Self Care   GOALS: Goals reviewed with patient? Yes  LONG TERM GOALS:   (STG=LTG)  GOALS Name Target Date  Goal status  1 Pt will demonstrate she has regained full shoulder ROM and function post operatively compared to baselines.  Baseline: 07/24/23 MET  2 Pt will be educated on return to exercise post reduction 07/24/23 MET               PLAN:  PT FREQUENCY/DURATION: SOZO only  PLAN FOR NEXT SESSION: SOZO only    [facility]  [address], [address]  [place] [address]  [phone]  After Breast Cancer Class Video It is recommended you view the ABC class video to be educated on lymphedema risk reduction. This video lasts for about 30 minutes. It can be viewed on our website here: [email or web address]  Scar massage You can begin gentle scar massage to you incision sites. Gently place one hand on the incision and move the skin (without sliding on the skin) in various directions. Do this for a few minutes and then you can gently massage either coconut oil or vitamin E cream into the scars.  Compression garment You should continue wearing your compression bra until you feel like you no longer have swelling.  Home exercise Program Continue doing the exercises you were given until you feel like you can do them without feeling any tightness at the end.   Walking Program Studies show that 30 minutes of walking per day (fast enough to elevate your heart rate) can significantly reduce the risk of a cancer recurrence. If you can't walk due to other medical reasons, we encourage you to find another activity you could do (like a stationary bike or water exercise).  Posture After breast cancer surgery, people frequently sit with rounded shoulders posture because it puts their incisions on slack and feels better. If you sit like this and scar tissue forms in that position, you can become very tight and have pain sitting or standing with good posture. Try to be aware of  your posture and sit and stand up tall to heal properly.  Follow up PT: It is recommended you return every 3 months for the first 3 years following surgery  to be assessed on the SOZO machine for an L-Dex score. This helps prevent clinically significant lymphedema in 95% of patients. These follow up screens are 10 minute appointments that you are not billed for.  Idamae Lusher, PT 07/24/2023, 8:35 AM

## 2023-07-24 ENCOUNTER — Ambulatory Visit: Payer: Self-pay | Attending: Surgery | Admitting: Rehabilitation

## 2023-07-24 ENCOUNTER — Encounter: Payer: Self-pay | Admitting: Rehabilitation

## 2023-07-24 ENCOUNTER — Ambulatory Visit: Payer: Medicare Other | Admitting: Plastic Surgery

## 2023-07-24 DIAGNOSIS — Z9189 Other specified personal risk factors, not elsewhere classified: Secondary | ICD-10-CM | POA: Diagnosis not present

## 2023-07-24 DIAGNOSIS — Z170 Estrogen receptor positive status [ER+]: Secondary | ICD-10-CM

## 2023-07-24 DIAGNOSIS — Z483 Aftercare following surgery for neoplasm: Secondary | ICD-10-CM | POA: Insufficient documentation

## 2023-07-24 DIAGNOSIS — N62 Hypertrophy of breast: Secondary | ICD-10-CM

## 2023-07-24 DIAGNOSIS — C50212 Malignant neoplasm of upper-inner quadrant of left female breast: Secondary | ICD-10-CM

## 2023-07-24 NOTE — Progress Notes (Signed)
 [name] returns today for evaluation prior to her oncoplastic breast reduction.  The incisions from her lumpectomy are all clean dry and intact.  She does have some ecchymoses and little fluctuance underneath the medial incision.  I suspect that she probably had a small hematoma which is resolving.  We discussed breast reduction again and I showed her where the incisions would be.  All of her questions were answered.  She is scheduled to undergo breast reduction on April 4.  I believe we can safely proceed with that.  She is scheduled to begin chemotherapy at the end of April.  I have asked that she follow-up with me prior to starting her chemotherapy to ensure that her incisions are healing well.  Oncoplastic breast reduction April 4.

## 2023-07-25 ENCOUNTER — Other Ambulatory Visit: Payer: Self-pay

## 2023-07-29 ENCOUNTER — Ambulatory Visit: Payer: Medicare Other | Admitting: Hematology and Oncology

## 2023-08-01 ENCOUNTER — Encounter: Payer: Self-pay | Admitting: Surgical

## 2023-08-01 ENCOUNTER — Ambulatory Visit: Admitting: Surgical

## 2023-08-01 ENCOUNTER — Ambulatory Visit: Payer: Medicare Other | Admitting: Obstetrics and Gynecology

## 2023-08-01 VITALS — BP 124/65 | HR 62 | Ht 63.5 in | Wt 195.2 lb

## 2023-08-01 DIAGNOSIS — N62 Hypertrophy of breast: Secondary | ICD-10-CM

## 2023-08-01 DIAGNOSIS — C50212 Malignant neoplasm of upper-inner quadrant of left female breast: Secondary | ICD-10-CM

## 2023-08-01 DIAGNOSIS — Z170 Estrogen receptor positive status [ER+]: Secondary | ICD-10-CM

## 2023-08-01 MED ORDER — ONDANSETRON HCL 4 MG PO TABS: 4.0000 mg | ORAL_TABLET | Freq: Three times a day (TID) | ORAL | 0 refills | Status: DC | PRN

## 2023-08-01 NOTE — H&P (View-Only) (Signed)
 Patient ID: Kayla Lowe, female    DOB: June 23, 1955, 68 y.o.   MRN: [id number]  Chief Complaint  Patient presents with   Pre-op Exam      ICD-10-CM   1. Macromastia  N62     2. Malignant neoplasm of upper-inner quadrant of left breast in female, estrogen receptor positive (HCC)  C50.212    Z17.0       History of Present Illness: Kayla Lowe is a 68 y.o.  female  with a history of macromastia.  She presents for preoperative evaluation for upcoming procedure, Bilateral oncoplastic breast Reduction, scheduled for 08/16/2023 with Dr.  Ladona Ridgel  The patient has not had problems with anesthesia. No history of DVT/PE.  No family or personal history of bleeding or clotting disorders.  Patient is not currently taking any blood thinners.  Patient denies any cardiac surgery history, she denies any history of MI.  Denies any history of CVAs.  She does report that her mother passed away from a pulmonary embolism.  She reports her mother was a lifetime smoker.  No other family history of DVT or PE.  She reports her mother did not have any other history of DVT or PE.  Patient denies any pulmonary history.  Patient was specifically asked if she had asthma as it was noted on her anesthesia preprocedure evaluation.  She denied history of asthma.   Summary of Previous Visit: Patient underwent left breast lumpectomy early February.  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  5 sentinel lymph nodes were removed, all negative for malignancy. She is going to require chemotherapy at the end of April. She is a great candidate for bilateral oncoplastic breast reduction to correct contour defect.  PMH Significant for: Left ventricular hypertrophy, iron deficiency anemia, GERD, hyperlipidemia, hypertension, left breast cancer with previous lumpectomy. 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    Patient is on ASA 81 mg daily, she reports this is prescribed by her PCP Dr. Yetta Barre.  She reports she stopped this prior to her lumpectomy without issue.  Patient reports she has been feeling well lately, no recent changes to her health.  She tolerated her lumpectomy without any issues and did well with anesthesia.  She reports she did well with pain control afterwards.  She reports she has tramadol that she occasionally takes for pain and difficulty sleeping.  She reports that she does not need any postoperative pain prescriptions.   Past Medical History: Allergies: Allergies  Allergen Reactions   Cozaar [Losartan Potassium] Other (See Comments)    Caused cough and sweats. Tingling in legs.   Lisinopril Cough   Statins Other (See Comments)    myalgias    Current Medications:  Current Outpatient Medications:    acetaminophen (TYLENOL) 500 MG tablet, Take 2 tablets (1,000 mg total) by mouth 4 (four) times daily. (Patient taking differently: Take 1,000 mg by mouth 3 (three) times daily.), Disp: 30 tablet, Rfl: 0   aspirin EC 81 MG tablet, Take 81 mg by mouth daily. Swallow whole., Disp: , Rfl:    carvedilol (COREG) 6.25 MG tablet, Take 1 tablet (6.25 mg total) by mouth 2 (two) times daily with a meal., Disp: 180 tablet, Rfl: 0   cetirizine (ZYRTEC) 10 MG tablet, Take 10 mg by mouth daily., Disp: , Rfl:    Cholecalciferol (VITAMIN D) 50 MCG (2000 UT) CAPS, Take 1 capsule by mouth daily., Disp: , Rfl:  ezetimibe (ZETIA) 10 MG tablet, TAKE 1 TABLET(10 MG) BY MOUTH DAILY, Disp: 90 tablet, Rfl: 0   famotidine (PEPCID) 40 MG tablet, TAKE 1 TABLET(40 MG) BY MOUTH AT BEDTIME, Disp: 90 tablet, Rfl: 1   Ferric Maltol (ACCRUFER) 30 MG CAPS, Take 1 capsule (30 mg total) by mouth in the morning and at bedtime., Disp: 180 capsule, Rfl: 0   fluticasone (FLONASE) 50 MCG/ACT nasal spray, Place 2 sprays into both nostrils daily., Disp: 48 g, Rfl: 1   hydrALAZINE (APRESOLINE) 25 MG tablet, TAKE 1  TABLET(25 MG) BY MOUTH TWO TIMES DAILY, Disp: , Rfl:    montelukast (SINGULAIR) 10 MG tablet, Take 1 tablet (10 mg total) by mouth at bedtime., Disp: 90 tablet, Rfl: 1   pantoprazole (PROTONIX) 20 MG tablet, TAKE 1 TABLET(20 MG) BY MOUTH DAILY, Disp: 90 tablet, Rfl: 1   pravastatin (PRAVACHOL) 40 MG tablet, TAKE 1 TABLET(40 MG) BY MOUTH DAILY, Disp: 90 tablet, Rfl: 1   tiZANidine (ZANAFLEX) 2 MG tablet, Take 1 tablet (2 mg total) by mouth every 8 (eight) hours as needed for muscle spasms., Disp: 270 tablet, Rfl: 0   traMADol (ULTRAM) 50 MG tablet, Take 1 tablet (50 mg total) by mouth every 6 (six) hours as needed., Disp: 270 tablet, Rfl: 0   triamcinolone cream (KENALOG) 0.5 %, Apply 1 Application topically as needed (Dermatitis)., Disp: , Rfl:    triamterene-hydrochlorothiazide (DYAZIDE) 37.5-25 MG capsule, Take 1 each (1 capsule total) by mouth daily., Disp: 90 capsule, Rfl: 0  Current Facility-Administered Medications:    0.9 %  sodium chloride infusion, 500 mL, Intravenous, Once, Pyrtle, Carie Caddy, MD  Past Medical Problems: Past Medical History:  Diagnosis Date   Anemia    iron deficiency   Breast cancer, left (HCC)    x2   GERD (gastroesophageal reflux disease)    History of kidney stones    Hyperlipidemia    Hypertension    Osteoarthritis     Past Surgical History: Past Surgical History:  Procedure Laterality Date   ABDOMINAL HYSTERECTOMY  1987   BREAST BIOPSY Left 05/28/2023   [place] LT BREAST BX W LOC DEV EA ADD LESION IMG BX SPEC US GUIDE 05/28/2023 [facility]   BREAST BIOPSY Left 05/28/2023   [place] LT BREAST BX W LOC DEV 1ST LESION IMG BX SPEC US GUIDE 05/28/2023 [facility]   BREAST BIOPSY Left 07/02/2023   [place] LT RADIOACTIVE SEED EA ADD LESION 07/02/2023 [facility]   BREAST BIOPSY Left 07/02/2023   [place] LT RADIOACTIVE SEED LOC 07/02/2023 [facility]   BREAST LUMPECTOMY WITH RADIOACTIVE SEED AND SENTINEL LYMPH NODE BIOPSY Left 07/03/2023   Procedure:  RADIOACTIVE SEED GUIDED LEFT BREAST LUMPECTOMY x2 AND LEFT SENTINEL NODE BIOPSY;  Surgeon: Abigail Miyamoto, MD;  Location: [facility];  Service: General;  Laterality: Left;  LMA   LAPAROTOMY N/A 02/04/2023   Procedure: EXPLORATORY LAPAROTOMY, WITH SMALL BOWEL RESECTION;  Surgeon: Abigail Miyamoto, MD;  Location: [facility];  Service: General;  Laterality: N/A;    Social History: Social History   Socioeconomic History   Marital status: Legally Separated    Spouse name: Not on file   Number of children: 3   Years of education: Not on file   Highest education level: Not on file  Occupational History   Occupation: nanny  Tobacco Use   Smoking status: Never   Smokeless tobacco: Never  Vaping Use   Vaping status: Never Used  Substance and Sexual Activity   Alcohol use: No  Drug use: No   Sexual activity: Not Currently  Other Topics Concern   Not on file  Social History Narrative   Not on file   Social Drivers of Health   Financial Resource Strain: Low Risk  (03/22/2022)   Overall Financial Resource Strain (CARDIA)    Difficulty of Paying Living Expenses: Not hard at all  Food Insecurity: Food Insecurity Present (06/14/2023)   Hunger Vital Sign    Worried About Running Out of Food in the Last Year: Sometimes true    Ran Out of Food in the Last Year: Sometimes true  Transportation Needs: No Transportation Needs (06/14/2023)   PRAPARE - Administrator, Civil Service (Medical): No    Lack of Transportation (Non-Medical): No  Physical Activity: Inactive (03/22/2022)   Exercise Vital Sign    Days of Exercise per Week: 0 days    Minutes of Exercise per Session: 0 min  Stress: No Stress Concern Present (03/22/2022)   [employer] of Occupational Health - Occupational Stress Questionnaire    Feeling of Stress : Not at all  Social Connections: Moderately Integrated (03/22/2022)   Social Connection and Isolation Panel [NHANES]    Frequency of Communication with Friends and  Family: More than three times a week    Frequency of Social Gatherings with Friends and Family: More than three times a week    Attends Religious Services: More than 4 times per year    Active Member of [employer] or Organizations: No    Attends Engineer, structural: More than 4 times per year    Marital Status: Separated  Intimate Partner Violence: Not At Risk (06/14/2023)   Humiliation, Afraid, Rape, and Kick questionnaire    Fear of Current or Ex-Partner: No    Emotionally Abused: No    Physically Abused: No    Sexually Abused: No    Family History: Family History  Problem Relation Age of Onset   Cancer Father    Hypertension Brother    Diabetes Brother    Diabetes Paternal Grandfather    Diabetes Paternal Uncle    Breast cancer Paternal Aunt    Colon cancer Neg Hx    Hypercalcemia Neg Hx     Review of Systems: Review of Systems  Constitutional: Negative.   Respiratory: Negative.    Cardiovascular: Negative.   Gastrointestinal: Negative.   Neurological: Negative.     Physical Exam: Vital Signs BP 124/65 (BP Location: Left Arm, Patient Position: Sitting, Cuff Size: Large)   Pulse 62   Ht 5' 3.5" (1.613 m)   Wt 195 lb 3.2 oz (88.5 kg)   SpO2 96%   BMI 34.04 kg/m   Physical Exam Constitutional:      General: Not in acute distress.    Appearance: Normal appearance. Not ill-appearing.  HENT:     Head: Normocephalic and atraumatic.  Eyes:     Pupils: Pupils are equal, round Neck:     Musculoskeletal: Normal range of motion.  Cardiovascular:     Rate and Rhythm: Normal rate    Pulses: Normal pulses.  Pulmonary:     Effort: Pulmonary effort is normal. No respiratory distress.  Musculoskeletal: Normal range of motion.  Skin:    General: Skin is warm and dry.     Findings: No erythema or rash.  Neurological:     General: No focal deficit present.     Mental Status: Alert and oriented to person, place, and time. Mental status is at baseline.  Motor:  No weakness.  Psychiatric:        Mood and Affect: Mood normal.        Behavior: Behavior normal.    Assessment/Plan: The patient is scheduled for bilateral oncoplastic breast reduction with Dr. Ladona Ridgel.  Risks, benefits, and alternatives of procedure discussed, questions answered and consent obtained.    Smoking Status: Quit 20 years ago; Counseling Given?  N/A Last Mammogram: Patient had a mammogram September 2024 which was normal.  She subsequently had a PET scan December 2024, noted to tracer avid soft tissue nodules within the medial aspect of the left breast.  She then underwent clip placement and unilateral diagnostic mammogram with 2 adjacent suspicious masses in the 10:00 region of the left breast.  She had left breast lumpectomy.  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 Recommendation for mechanical and possible pharmacological prophylaxis. Encourage early ambulation.   Pictures obtained: @consult   Post-op Rx sent to pharmacy:  Z[facility]  Patient was provided with the breast reduction and General Surgical Risk consent document and Pain Medication Agreement prior to their appointment.  They had adequate time to read through the risk consent documents and Pain Medication Agreement. We also discussed them in person together during this preop appointment. All of their questions were answered to their satisfaction.  Recommended calling if they have any further questions.  Risk consent form and Pain Medication Agreement to be scanned into patient's chart.  The risk that can be encountered with breast reduction were discussed and include the following but not limited to these:  Breast asymmetry, fluid accumulation, firmness of the breast, inability to breast feed, loss of nipple or areola, skin loss,  decrease or no nipple sensation, fat necrosis of the breast tissue, bleeding, infection, healing delay.  There are risks of anesthesia, changes to skin sensation and injury to nerves or blood vessels.  The muscle can be temporarily or permanently injured.  You may have an allergic reaction to tape, suture, glue, blood products which can result in skin discoloration, swelling, pain, skin lesions, poor healing.  Any of these can lead to the need for revisonal surgery or stage procedures.  A reduction has potential to interfere with diagnostic procedures.  Nipple or breast piercing can increase risks of infection.  This procedure is best done when the breast is fully developed.  Changes in the breast will continue to occur over time.  Pregnancy can alter the outcomes of previous breast reduction surgery, weight gain and weigh loss can also effect the long term appearance.   Patient is on ASA 81 mg daily, confirmed with patient's PCP Dr. Yetta Barre that he is okay with her holding this prior to surgery.  Electronically signed by: Kermit Balo Khaila Velarde, PA-C 08/01/2023 11:06 AM

## 2023-08-01 NOTE — Progress Notes (Signed)
 Patient ID: Kayla Lowe, female    DOB: June 23, 1955, 68 y.o.   MRN: [id number]  Chief Complaint  Patient presents with   Pre-op Exam      ICD-10-CM   1. Macromastia  N62     2. Malignant neoplasm of upper-inner quadrant of left breast in female, estrogen receptor positive (HCC)  C50.212    Z17.0       History of Present Illness: Kayla Lowe is a 68 y.o.  female  with a history of macromastia.  She presents for preoperative evaluation for upcoming procedure, Bilateral oncoplastic breast Reduction, scheduled for 08/16/2023 with Dr.  Ladona Ridgel  The patient has not had problems with anesthesia. No history of DVT/PE.  No family or personal history of bleeding or clotting disorders.  Patient is not currently taking any blood thinners.  Patient denies any cardiac surgery history, she denies any history of MI.  Denies any history of CVAs.  She does report that her mother passed away from a pulmonary embolism.  She reports her mother was a lifetime smoker.  No other family history of DVT or PE.  She reports her mother did not have any other history of DVT or PE.  Patient denies any pulmonary history.  Patient was specifically asked if she had asthma as it was noted on her anesthesia preprocedure evaluation.  She denied history of asthma.   Summary of Previous Visit: Patient underwent left breast lumpectomy early February.  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  5 sentinel lymph nodes were removed, all negative for malignancy. She is going to require chemotherapy at the end of April. She is a great candidate for bilateral oncoplastic breast reduction to correct contour defect.  PMH Significant for: Left ventricular hypertrophy, iron deficiency anemia, GERD, hyperlipidemia, hypertension, left breast cancer with previous lumpectomy. 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    Patient is on ASA 81 mg daily, she reports this is prescribed by her PCP Dr. Yetta Barre.  She reports she stopped this prior to her lumpectomy without issue.  Patient reports she has been feeling well lately, no recent changes to her health.  She tolerated her lumpectomy without any issues and did well with anesthesia.  She reports she did well with pain control afterwards.  She reports she has tramadol that she occasionally takes for pain and difficulty sleeping.  She reports that she does not need any postoperative pain prescriptions.   Past Medical History: Allergies: Allergies  Allergen Reactions   Cozaar [Losartan Potassium] Other (See Comments)    Caused cough and sweats. Tingling in legs.   Lisinopril Cough   Statins Other (See Comments)    myalgias    Current Medications:  Current Outpatient Medications:    acetaminophen (TYLENOL) 500 MG tablet, Take 2 tablets (1,000 mg total) by mouth 4 (four) times daily. (Patient taking differently: Take 1,000 mg by mouth 3 (three) times daily.), Disp: 30 tablet, Rfl: 0   aspirin EC 81 MG tablet, Take 81 mg by mouth daily. Swallow whole., Disp: , Rfl:    carvedilol (COREG) 6.25 MG tablet, Take 1 tablet (6.25 mg total) by mouth 2 (two) times daily with a meal., Disp: 180 tablet, Rfl: 0   cetirizine (ZYRTEC) 10 MG tablet, Take 10 mg by mouth daily., Disp: , Rfl:    Cholecalciferol (VITAMIN D) 50 MCG (2000 UT) CAPS, Take 1 capsule by mouth daily., Disp: , Rfl:  ezetimibe (ZETIA) 10 MG tablet, TAKE 1 TABLET(10 MG) BY MOUTH DAILY, Disp: 90 tablet, Rfl: 0   famotidine (PEPCID) 40 MG tablet, TAKE 1 TABLET(40 MG) BY MOUTH AT BEDTIME, Disp: 90 tablet, Rfl: 1   Ferric Maltol (ACCRUFER) 30 MG CAPS, Take 1 capsule (30 mg total) by mouth in the morning and at bedtime., Disp: 180 capsule, Rfl: 0   fluticasone (FLONASE) 50 MCG/ACT nasal spray, Place 2 sprays into both nostrils daily., Disp: 48 g, Rfl: 1   hydrALAZINE (APRESOLINE) 25 MG tablet, TAKE 1  TABLET(25 MG) BY MOUTH TWO TIMES DAILY, Disp: , Rfl:    montelukast (SINGULAIR) 10 MG tablet, Take 1 tablet (10 mg total) by mouth at bedtime., Disp: 90 tablet, Rfl: 1   pantoprazole (PROTONIX) 20 MG tablet, TAKE 1 TABLET(20 MG) BY MOUTH DAILY, Disp: 90 tablet, Rfl: 1   pravastatin (PRAVACHOL) 40 MG tablet, TAKE 1 TABLET(40 MG) BY MOUTH DAILY, Disp: 90 tablet, Rfl: 1   tiZANidine (ZANAFLEX) 2 MG tablet, Take 1 tablet (2 mg total) by mouth every 8 (eight) hours as needed for muscle spasms., Disp: 270 tablet, Rfl: 0   traMADol (ULTRAM) 50 MG tablet, Take 1 tablet (50 mg total) by mouth every 6 (six) hours as needed., Disp: 270 tablet, Rfl: 0   triamcinolone cream (KENALOG) 0.5 %, Apply 1 Application topically as needed (Dermatitis)., Disp: , Rfl:    triamterene-hydrochlorothiazide (DYAZIDE) 37.5-25 MG capsule, Take 1 each (1 capsule total) by mouth daily., Disp: 90 capsule, Rfl: 0  Current Facility-Administered Medications:    0.9 %  sodium chloride infusion, 500 mL, Intravenous, Once, Pyrtle, Carie Caddy, MD  Past Medical Problems: Past Medical History:  Diagnosis Date   Anemia    iron deficiency   Breast cancer, left (HCC)    x2   GERD (gastroesophageal reflux disease)    History of kidney stones    Hyperlipidemia    Hypertension    Osteoarthritis     Past Surgical History: Past Surgical History:  Procedure Laterality Date   ABDOMINAL HYSTERECTOMY  1987   BREAST BIOPSY Left 05/28/2023   [place] LT BREAST BX W LOC DEV EA ADD LESION IMG BX SPEC US GUIDE 05/28/2023 [facility]   BREAST BIOPSY Left 05/28/2023   [place] LT BREAST BX W LOC DEV 1ST LESION IMG BX SPEC US GUIDE 05/28/2023 [facility]   BREAST BIOPSY Left 07/02/2023   [place] LT RADIOACTIVE SEED EA ADD LESION 07/02/2023 [facility]   BREAST BIOPSY Left 07/02/2023   [place] LT RADIOACTIVE SEED LOC 07/02/2023 [facility]   BREAST LUMPECTOMY WITH RADIOACTIVE SEED AND SENTINEL LYMPH NODE BIOPSY Left 07/03/2023   Procedure:  RADIOACTIVE SEED GUIDED LEFT BREAST LUMPECTOMY x2 AND LEFT SENTINEL NODE BIOPSY;  Surgeon: Abigail Miyamoto, MD;  Location: [facility];  Service: General;  Laterality: Left;  LMA   LAPAROTOMY N/A 02/04/2023   Procedure: EXPLORATORY LAPAROTOMY, WITH SMALL BOWEL RESECTION;  Surgeon: Abigail Miyamoto, MD;  Location: [facility];  Service: General;  Laterality: N/A;    Social History: Social History   Socioeconomic History   Marital status: Legally Separated    Spouse name: Not on file   Number of children: 3   Years of education: Not on file   Highest education level: Not on file  Occupational History   Occupation: nanny  Tobacco Use   Smoking status: Never   Smokeless tobacco: Never  Vaping Use   Vaping status: Never Used  Substance and Sexual Activity   Alcohol use: No  Drug use: No   Sexual activity: Not Currently  Other Topics Concern   Not on file  Social History Narrative   Not on file   Social Drivers of Health   Financial Resource Strain: Low Risk  (03/22/2022)   Overall Financial Resource Strain (CARDIA)    Difficulty of Paying Living Expenses: Not hard at all  Food Insecurity: Food Insecurity Present (06/14/2023)   Hunger Vital Sign    Worried About Running Out of Food in the Last Year: Sometimes true    Ran Out of Food in the Last Year: Sometimes true  Transportation Needs: No Transportation Needs (06/14/2023)   PRAPARE - Administrator, Civil Service (Medical): No    Lack of Transportation (Non-Medical): No  Physical Activity: Inactive (03/22/2022)   Exercise Vital Sign    Days of Exercise per Week: 0 days    Minutes of Exercise per Session: 0 min  Stress: No Stress Concern Present (03/22/2022)   [employer] of Occupational Health - Occupational Stress Questionnaire    Feeling of Stress : Not at all  Social Connections: Moderately Integrated (03/22/2022)   Social Connection and Isolation Panel [NHANES]    Frequency of Communication with Friends and  Family: More than three times a week    Frequency of Social Gatherings with Friends and Family: More than three times a week    Attends Religious Services: More than 4 times per year    Active Member of [employer] or Organizations: No    Attends Engineer, structural: More than 4 times per year    Marital Status: Separated  Intimate Partner Violence: Not At Risk (06/14/2023)   Humiliation, Afraid, Rape, and Kick questionnaire    Fear of Current or Ex-Partner: No    Emotionally Abused: No    Physically Abused: No    Sexually Abused: No    Family History: Family History  Problem Relation Age of Onset   Cancer Father    Hypertension Brother    Diabetes Brother    Diabetes Paternal Grandfather    Diabetes Paternal Uncle    Breast cancer Paternal Aunt    Colon cancer Neg Hx    Hypercalcemia Neg Hx     Review of Systems: Review of Systems  Constitutional: Negative.   Respiratory: Negative.    Cardiovascular: Negative.   Gastrointestinal: Negative.   Neurological: Negative.     Physical Exam: Vital Signs BP 124/65 (BP Location: Left Arm, Patient Position: Sitting, Cuff Size: Large)   Pulse 62   Ht 5' 3.5" (1.613 m)   Wt 195 lb 3.2 oz (88.5 kg)   SpO2 96%   BMI 34.04 kg/m   Physical Exam Constitutional:      General: Not in acute distress.    Appearance: Normal appearance. Not ill-appearing.  HENT:     Head: Normocephalic and atraumatic.  Eyes:     Pupils: Pupils are equal, round Neck:     Musculoskeletal: Normal range of motion.  Cardiovascular:     Rate and Rhythm: Normal rate    Pulses: Normal pulses.  Pulmonary:     Effort: Pulmonary effort is normal. No respiratory distress.  Musculoskeletal: Normal range of motion.  Skin:    General: Skin is warm and dry.     Findings: No erythema or rash.  Neurological:     General: No focal deficit present.     Mental Status: Alert and oriented to person, place, and time. Mental status is at baseline.  Motor:  No weakness.  Psychiatric:        Mood and Affect: Mood normal.        Behavior: Behavior normal.    Assessment/Plan: The patient is scheduled for bilateral oncoplastic breast reduction with Dr. Ladona Ridgel.  Risks, benefits, and alternatives of procedure discussed, questions answered and consent obtained.    Smoking Status: Quit 20 years ago; Counseling Given?  N/A Last Mammogram: Patient had a mammogram September 2024 which was normal.  She subsequently had a PET scan December 2024, noted to tracer avid soft tissue nodules within the medial aspect of the left breast.  She then underwent clip placement and unilateral diagnostic mammogram with 2 adjacent suspicious masses in the 10:00 region of the left breast.  She had left breast lumpectomy.  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 Recommendation for mechanical and possible pharmacological prophylaxis. Encourage early ambulation.   Pictures obtained: @consult   Post-op Rx sent to pharmacy:  [facility]  Patient was provided with the breast reduction and General Surgical Risk consent document and Pain Medication Agreement prior to their appointment.  They had adequate time to read through the risk consent documents and Pain Medication Agreement. We also discussed them in person together during this preop appointment. All of their questions were answered to their satisfaction.  Recommended calling if they have any further questions.  Risk consent form and Pain Medication Agreement to be scanned into patient's chart.  The risk that can be encountered with breast reduction were discussed and include the following but not limited to these:  Breast asymmetry, fluid accumulation, firmness of the breast, inability to breast feed, loss of nipple or areola, skin loss,  decrease or no nipple sensation, fat necrosis of the breast tissue, bleeding, infection, healing delay.  There are risks of anesthesia, changes to skin sensation and injury to nerves or blood vessels.  The muscle can be temporarily or permanently injured.  You may have an allergic reaction to tape, suture, glue, blood products which can result in skin discoloration, swelling, pain, skin lesions, poor healing.  Any of these can lead to the need for revisonal surgery or stage procedures.  A reduction has potential to interfere with diagnostic procedures.  Nipple or breast piercing can increase risks of infection.  This procedure is best done when the breast is fully developed.  Changes in the breast will continue to occur over time.  Pregnancy can alter the outcomes of previous breast reduction surgery, weight gain and weigh loss can also effect the long term appearance.   Patient is on ASA 81 mg daily, confirmed with patient's PCP Dr. Yetta Barre that he is okay with her holding this prior to surgery.  Electronically signed by: Kermit Balo Khaila Velarde, PA-C 08/01/2023 11:06 AM

## 2023-08-07 ENCOUNTER — Ambulatory Visit (INDEPENDENT_AMBULATORY_CARE_PROVIDER_SITE_OTHER): Payer: Medicare Other

## 2023-08-07 VITALS — Ht 63.5 in | Wt 193.2 lb

## 2023-08-07 DIAGNOSIS — Z0000 Encounter for general adult medical examination without abnormal findings: Secondary | ICD-10-CM

## 2023-08-07 NOTE — Progress Notes (Signed)
 Subjective:   Kayla Lowe is a 68 y.o. who presents for a Medicare Wellness preventive visit.  Visit Complete: Virtual I connected with  Kayla Lowe on 08/07/23 by a audio enabled telemedicine application and verified that I am speaking with the correct person using two identifiers.  Patient Location: Home  Provider Location: Home Office  I discussed the limitations of evaluation and management by telemedicine. The patient expressed understanding and agreed to proceed.  Vital Signs: Because this visit was a virtual/telehealth visit, some criteria may be missing or patient reported. Any vitals not documented were not able to be obtained and vitals that have been documented are patient reported.  VideoDeclined- This patient declined Librarian, academic. Therefore the visit was completed with audio only.  Persons Participating in Visit: Patient.  AWV Questionnaire: No: Patient Medicare AWV questionnaire was not completed prior to this visit.  Cardiac Risk Factors include: advanced age (>27men, >60 women);hypertension;dyslipidemia;Other (see comment), Risk factor comments: LVH     Objective:    Today's Vitals   08/07/23 0852  Weight: 193 lb 3.2 oz (87.6 kg)  Height: 5' 3.5" (1.613 m)   Body mass index is 33.69 kg/m.     08/07/2023    8:59 AM 06/28/2023    8:24 AM 06/14/2023    7:58 AM 02/04/2023    1:31 PM 02/03/2023    8:31 PM 02/03/2023    1:52 PM 03/22/2022    3:06 PM  Advanced Directives  Does Patient Have a Medical Advance Directive? No No No No  No No  Would patient like information on creating a medical advance directive?  No - Patient declined No - Patient declined No - Patient declined No - Patient declined  No - Patient declined    Current Medications (verified) Outpatient Encounter Medications as of 08/07/2023  Medication Sig   acetaminophen (TYLENOL) 500 MG tablet Take 2 tablets (1,000 mg total) by mouth 4  (four) times daily. (Patient taking differently: Take 1,000 mg by mouth 3 (three) times daily.)   aspirin EC 81 MG tablet Take 81 mg by mouth daily. Swallow whole.   carvedilol (COREG) 6.25 MG tablet Take 1 tablet (6.25 mg total) by mouth 2 (two) times daily with a meal.   cetirizine (ZYRTEC) 10 MG tablet Take 10 mg by mouth daily.   Cholecalciferol (VITAMIN D) 50 MCG (2000 UT) CAPS Take 1 capsule by mouth daily.   ezetimibe (ZETIA) 10 MG tablet TAKE 1 TABLET(10 MG) BY MOUTH DAILY   famotidine (PEPCID) 40 MG tablet TAKE 1 TABLET(40 MG) BY MOUTH AT BEDTIME   Ferric Maltol (ACCRUFER) 30 MG CAPS Take 1 capsule (30 mg total) by mouth in the morning and at bedtime.   fluticasone (FLONASE) 50 MCG/ACT nasal spray Place 2 sprays into both nostrils daily.   hydrALAZINE (APRESOLINE) 25 MG tablet TAKE 1 TABLET(25 MG) BY MOUTH TWO TIMES DAILY   montelukast (SINGULAIR) 10 MG tablet Take 1 tablet (10 mg total) by mouth at bedtime.   ondansetron (ZOFRAN) 4 MG tablet Take 1 tablet (4 mg total) by mouth every 8 (eight) hours as needed for nausea or vomiting.   pantoprazole (PROTONIX) 20 MG tablet TAKE 1 TABLET(20 MG) BY MOUTH DAILY   pravastatin (PRAVACHOL) 40 MG tablet TAKE 1 TABLET(40 MG) BY MOUTH DAILY   tiZANidine (ZANAFLEX) 2 MG tablet Take 1 tablet (2 mg total) by mouth every 8 (eight) hours as needed for muscle spasms.   traMADol (ULTRAM) 50 MG tablet Take 1  tablet (50 mg total) by mouth every 6 (six) hours as needed.   triamcinolone cream (KENALOG) 0.5 % Apply 1 Application topically as needed (Dermatitis).   triamterene-hydrochlorothiazide (DYAZIDE) 37.5-25 MG capsule Take 1 each (1 capsule total) by mouth daily.   Facility-Administered Encounter Medications as of 08/07/2023  Medication   0.9 %  sodium chloride infusion    Allergies (verified) Cozaar [losartan potassium], Lisinopril, and Statins   History: Past Medical History:  Diagnosis Date   Anemia    iron deficiency   Breast cancer, left  (HCC)    x2   GERD (gastroesophageal reflux disease)    History of kidney stones    Hyperlipidemia    Hypertension    Osteoarthritis    Past Surgical History:  Procedure Laterality Date   ABDOMINAL HYSTERECTOMY  1987   BREAST BIOPSY Left 05/28/2023   [place] LT BREAST BX W LOC DEV EA ADD LESION IMG BX SPEC US GUIDE 05/28/2023 [facility]   BREAST BIOPSY Left 05/28/2023   [place] LT BREAST BX W LOC DEV 1ST LESION IMG BX SPEC US GUIDE 05/28/2023 [facility]   BREAST BIOPSY Left 07/02/2023   [place] LT RADIOACTIVE SEED EA ADD LESION 07/02/2023 [facility]   BREAST BIOPSY Left 07/02/2023   [place] LT RADIOACTIVE SEED LOC 07/02/2023 [facility]   BREAST LUMPECTOMY WITH RADIOACTIVE SEED AND SENTINEL LYMPH NODE BIOPSY Left 07/03/2023   Procedure: RADIOACTIVE SEED GUIDED LEFT BREAST LUMPECTOMY x2 AND LEFT SENTINEL NODE BIOPSY;  Surgeon: Abigail Miyamoto, MD;  Location: [facility];  Service: General;  Laterality: Left;  LMA   LAPAROTOMY N/A 02/04/2023   Procedure: EXPLORATORY LAPAROTOMY, WITH SMALL BOWEL RESECTION;  Surgeon: Abigail Miyamoto, MD;  Location: [facility];  Service: General;  Laterality: N/A;   Family History  Problem Relation Age of Onset   Cancer Father    Hypertension Brother    Diabetes Brother    Diabetes Paternal Grandfather    Diabetes Paternal Uncle    Breast cancer Paternal Aunt    Colon cancer Neg Hx    Hypercalcemia Neg Hx    Social History   Socioeconomic History   Marital status: Legally Separated    Spouse name: Not on file   Number of children: 3   Years of education: Not on file   Highest education level: Not on file  Occupational History   Occupation: RETIRED/nanny  Tobacco Use   Smoking status: Never   Smokeless tobacco: Never  Vaping Use   Vaping status: Never Used  Substance and Sexual Activity   Alcohol use: No   Drug use: No   Sexual activity: Not Currently  Other Topics Concern   Not on file  Social History Narrative   Lives with a roomate    Social Drivers of Health   Financial Resource Strain: Medium Risk (08/07/2023)   Overall Financial Resource Strain (CARDIA)    Difficulty of Paying Living Expenses: Somewhat hard  Food Insecurity: No Food Insecurity (08/07/2023)   Hunger Vital Sign    Worried About Running Out of Food in the Last Year: Never true    Ran Out of Food in the Last Year: Never true  Recent Concern: Food Insecurity - Food Insecurity Present (06/14/2023)   Hunger Vital Sign    Worried About Radiation protection practitioner of Food in the Last Year: Sometimes true    Ran Out of Food in the Last Year: Sometimes true  Transportation Needs: No Transportation Needs (08/07/2023)   PRAPARE - Transportation  Lack of Transportation (Medical): No    Lack of Transportation (Non-Medical): No  Physical Activity: Inactive (08/07/2023)   Exercise Vital Sign    Days of Exercise per Week: 0 days    Minutes of Exercise per Session: 0 min  Stress: No Stress Concern Present (08/07/2023)   [employer] of Occupational Health - Occupational Stress Questionnaire    Feeling of Stress : Not at all  Social Connections: Moderately Integrated (08/07/2023)   Social Connection and Isolation Panel [NHANES]    Frequency of Communication with Friends and Family: More than three times a week    Frequency of Social Gatherings with Friends and Family: Not on file    Attends Religious Services: More than 4 times per year    Active Member of [employer] or Organizations: Yes    Attends Banker Meetings: Never    Marital Status: Separated    Tobacco Counseling Counseling given: Not Answered    Clinical Intake:  Pre-visit preparation completed: Yes  Pain : No/denies pain     BMI - recorded: 33.69 Nutritional Status: BMI > 30  Obese Nutritional Risks: None Diabetes: No  Lab Results  Component Value Date   HGBA1C 5.4 08/31/2019   HGBA1C 5.4 11/30/2016   HGBA1C 5.4 06/14/2014     How often do you need to have someone help you when  you read instructions, pamphlets, or other written materials from your doctor or pharmacy?: 1 - Never  Interpreter Needed?: No  Information entered by :: Bleu Minerd, RMA   Activities of Daily Living     08/07/2023    8:54 AM 06/28/2023    8:27 AM  In your present state of health, do you have any difficulty performing the following activities:  Hearing? 0   Vision? 0   Difficulty concentrating or making decisions? 0   Walking or climbing stairs? 0   Dressing or bathing? 0   Doing errands, shopping? 0 0  Preparing Food and eating ? N   Using the Toilet? N   In the past six months, have you accidently leaked urine? N   Do you have problems with loss of bowel control? N   Managing your Medications? N   Managing your Finances? N   Housekeeping or managing your Housekeeping? N     Patient Care Team: Etta Grandchild, MD as PCP - General (Internal Medicine) Norva Pavlov, OD as Consulting Physician (Optometry) Rachel Moulds, MD as Consulting Physician (Hematology and Oncology) Pershing Proud, RN as Oncology Nurse Navigator Abigail Miyamoto, MD as Consulting Physician (General Surgery) Donnelly Angelica, RN as Oncology Nurse Navigator Francisco, Carlus Pavlov, RPH (Pharmacist) [facility]  Indicate any recent Medical Services you may have received from other than [facility] providers in the past year (date may be approximate).     Assessment:   This is a routine wellness examination for Kayla Lowe.  Hearing/Vision screen Hearing Screening - Comments:: Denies hearing difficulties   Vision Screening - Comments:: Denies vision issues.    Goals Addressed               This Visit's Progress     My goal is to lose 4o lbs, watch my sweet intake, drink more water and prevent falls. (pt-stated)        Patient is still working on it.         Depression Screen     08/07/2023    9:05 AM 06/19/2023    9:51 AM 06/14/2023  11:13 AM 03/13/2023   10:27 AM 03/22/2022     3:10 PM 01/17/2022    8:19 AM 07/11/2021    8:03 AM  PHQ 2/9 Scores  PHQ - 2 Score 0 0 0 0 0 0 0  PHQ- 9 Score 0   0  0     Fall Risk     08/07/2023    8:59 AM 06/19/2023    9:51 AM 03/13/2023   10:27 AM 03/22/2022    3:06 PM 01/17/2022    8:18 AM  Fall Risk   Falls in the past year? 0 0 0 0 0  Number falls in past yr: 0 0 0 0 0  Injury with Fall? 0 0 0 0 0  Risk for fall due to : No Fall Risks No Fall Risks No Fall Risks No Fall Risks No Fall Risks  Follow up Falls prevention discussed;Falls evaluation completed Falls evaluation completed Falls evaluation completed Falls prevention discussed Falls evaluation completed    MEDICARE RISK AT HOME:  Medicare Risk at Home Any stairs in or around the home?: Yes (walk up to her apartment) If so, are there any without handrails?: No Home free of loose throw rugs in walkways, pet beds, electrical cords, etc?: Yes Adequate lighting in your home to reduce risk of falls?: Yes Life alert?: No Use of a cane, walker or w/c?: No Grab bars in the bathroom?: Yes Shower chair or bench in shower?: Yes Elevated toilet seat or a handicapped toilet?: Yes  TIMED UP AND GO:  Was the test performed?  No  Cognitive Function: 6CIT completed        08/07/2023    9:00 AM 03/22/2022    3:07 PM 04/18/2020    8:58 AM  6CIT Screen  What Year? 0 points 0 points 0 points  What month? 0 points 0 points 0 points  What time? 0 points 0 points 0 points  Count back from 20 0 points 0 points 0 points  Months in reverse 0 points 0 points 0 points  Repeat phrase 0 points 0 points 0 points  Total Score 0 points 0 points 0 points    Immunizations Immunization History  Administered Date(s) Administered   Fluad Quad(high Dose 65+) 03/14/2020, 02/27/2022   Influenza,inj,Quad PF,6+ Mos 02/17/2014, 03/24/2015, 02/06/2016, 01/18/2017, 03/27/2018, 02/09/2019   Influenza-Unspecified 05/11/2021, 02/15/2023   PFIZER Comirnaty(Gray Top)Covid-19 Tri-Sucrose Vaccine  09/14/2020, 02/27/2022   PFIZER(Purple Top)SARS-COV-2 Vaccination 07/16/2019, 08/12/2019, 03/12/2020   Pfizer Covid-19 Vaccine Bivalent Booster 90yrs & up 05/11/2021   Pfizer(Comirnaty)Fall Seasonal Vaccine 12 years and older 02/27/2022   Pneumococcal Conjugate-13 07/29/2018   Pneumococcal Polysaccharide-23 02/17/2014, 09/16/2019   Tdap 08/03/2015   Zoster Recombinant(Shingrix) 04/26/2022, 07/27/2022    Screening Tests Health Maintenance  Topic Date Due   COVID-19 Vaccine (7 - 2024-25 season) 01/13/2023   Colonoscopy  01/21/2024   MAMMOGRAM  01/23/2024   Medicare Annual Wellness (AWV)  08/06/2024   DTaP/Tdap/Td (2 - Td or Tdap) 08/02/2025   Pneumonia Vaccine 63+ Years old  Completed   INFLUENZA VACCINE  Completed   DEXA SCAN  Completed   Hepatitis C Screening  Completed   Zoster Vaccines- Shingrix  Completed   HPV VACCINES  Aged Out    Health Maintenance  Health Maintenance Due  Topic Date Due   COVID-19 Vaccine (7 - 2024-25 season) 01/13/2023   Health Maintenance Items Addressed: See Nurse Notes  Additional Screening:  Vision Screening: Recommended annual ophthalmology exams for early detection of glaucoma and other  disorders of the eye.  Dental Screening: Recommended annual dental exams for proper oral hygiene  Community Resource Referral / Chronic Care Management: CRR required this visit?  No   CCM required this visit?  No     Plan:     I have personally reviewed and noted the following in the patient's chart:   Medical and social history Use of alcohol, tobacco or illicit drugs  Current medications and supplements including opioid prescriptions. Patient is currently taking opioid prescriptions. Information provided to patient regarding non-opioid alternatives. Patient advised to discuss non-opioid treatment plan with their provider. Functional ability and status Nutritional status Physical activity Advanced directives List of other  physicians Hospitalizations, surgeries, and ER visits in previous 12 months Vitals Screenings to include cognitive, depression, and falls Referrals and appointments  In addition, I have reviewed and discussed with patient certain preventive protocols, quality metrics, and best practice recommendations. A written personalized care plan for preventive services as well as general preventive health recommendations were provided to patient.     Layson Bertsch L Briget Shaheed, CMA   08/07/2023   After Visit Summary: (MyChart) Due to this being a telephonic visit, the after visit summary with patients personalized plan was offered to patient via MyChart   Notes: Please refer to Routing Comments.

## 2023-08-07 NOTE — Patient Instructions (Addendum)
 [name] , Thank you for taking time to come for your Medicare Wellness Visit. I appreciate your ongoing commitment to your health goals. Please review the following plan we discussed and let me know if I can assist you in the future.   Referrals/Orders/Follow-Ups/Clinician Recommendations: It was nice talking to you today.  You will be due for a colonoscopy and a mammogram in Sept of this year.  Each day, aim for 6 glasses of water, plenty of protein in your diet and try to get up and walk/ stretch every hour for 5-10 minutes at a time.    This is a list of the screening recommended for you and due dates:  Health Maintenance  Topic Date Due   COVID-19 Vaccine (7 - 2024-25 season) 01/13/2023   Colon Cancer Screening  01/21/2024   Mammogram  01/23/2024   Medicare Annual Wellness Visit  08/06/2024   DTaP/Tdap/Td vaccine (2 - Td or Tdap) 08/02/2025   Pneumonia Vaccine  Completed   Flu Shot  Completed   DEXA scan (bone density measurement)  Completed   Hepatitis C Screening  Completed   Zoster (Shingles) Vaccine  Completed   HPV Vaccine  Aged Out    Advanced directives: (Declined) Advance directive discussed with you today. Even though you declined this today, please call our office should you change your mind, and we can give you the proper paperwork for you to fill out.  Next Medicare Annual Wellness Visit scheduled for next year: Yes

## 2023-08-08 ENCOUNTER — Other Ambulatory Visit: Payer: Self-pay

## 2023-08-09 ENCOUNTER — Encounter (HOSPITAL_BASED_OUTPATIENT_CLINIC_OR_DEPARTMENT_OTHER): Payer: Self-pay | Admitting: Plastic Surgery

## 2023-08-09 ENCOUNTER — Other Ambulatory Visit: Payer: Self-pay

## 2023-08-13 ENCOUNTER — Encounter (HOSPITAL_BASED_OUTPATIENT_CLINIC_OR_DEPARTMENT_OTHER)
Admission: RE | Admit: 2023-08-13 | Discharge: 2023-08-13 | Disposition: A | Discharge status: Home / Self Care | Source: Ambulatory Visit | Attending: Plastic Surgery | Admitting: Plastic Surgery

## 2023-08-13 DIAGNOSIS — Z79899 Other long term (current) drug therapy: Secondary | ICD-10-CM | POA: Diagnosis not present

## 2023-08-13 DIAGNOSIS — Z01812 Encounter for preprocedural laboratory examination: Secondary | ICD-10-CM | POA: Diagnosis not present

## 2023-08-13 DIAGNOSIS — Z01818 Encounter for other preprocedural examination: Secondary | ICD-10-CM | POA: Diagnosis present

## 2023-08-13 LAB — BASIC METABOLIC PANEL WITH GFR
Anion gap: 8 (ref 5–15)
BUN: 11 mg/dL (ref 8–23)
CO2: 25 mmol/L (ref 22–32)
Calcium: 10.7 mg/dL — ABNORMAL HIGH (ref 8.9–10.3)
Chloride: 106 mmol/L (ref 98–111)
Creatinine, Ser: 1.09 mg/dL — ABNORMAL HIGH (ref 0.44–1.00)
GFR, Estimated: 55 mL/min — ABNORMAL LOW (ref >60)
Glucose, Bld: 86 mg/dL (ref 70–99)
Potassium: 4.2 mmol/L (ref 3.5–5.1)
Sodium: 139 mmol/L (ref 135–145)

## 2023-08-13 MED ORDER — CHLORHEXIDINE GLUCONATE CLOTH 2 % EX PADS: 6.0000 | MEDICATED_PAD | Freq: Once | CUTANEOUS | Status: DC

## 2023-08-13 NOTE — Progress Notes (Signed)

## 2023-08-14 ENCOUNTER — Telehealth: Payer: Self-pay

## 2023-08-14 NOTE — Telephone Encounter (Signed)
 Left message on voicemail about appointment for patient on 08/15/23

## 2023-08-15 ENCOUNTER — Other Ambulatory Visit: Payer: Self-pay

## 2023-08-15 ENCOUNTER — Inpatient Hospital Stay (HOSPITAL_BASED_OUTPATIENT_CLINIC_OR_DEPARTMENT_OTHER): Payer: Medicare Other | Admitting: Hematology and Oncology

## 2023-08-15 ENCOUNTER — Inpatient Hospital Stay: Payer: Medicare Other | Attending: Hematology and Oncology

## 2023-08-15 VITALS — BP 134/53 | HR 69 | Temp 97.0°F | Resp 17 | Wt 196.7 lb

## 2023-08-15 DIAGNOSIS — Z170 Estrogen receptor positive status [ER+]: Secondary | ICD-10-CM

## 2023-08-15 DIAGNOSIS — C8580 Other specified types of non-Hodgkin lymphoma, unspecified site: Secondary | ICD-10-CM | POA: Diagnosis not present

## 2023-08-15 DIAGNOSIS — C50212 Malignant neoplasm of upper-inner quadrant of left female breast: Secondary | ICD-10-CM

## 2023-08-15 DIAGNOSIS — C8309 Small cell B-cell lymphoma, extranodal and solid organ sites: Secondary | ICD-10-CM | POA: Insufficient documentation

## 2023-08-15 LAB — CBC WITH DIFFERENTIAL/PLATELET
Abs Immature Granulocytes: 0.02 10*3/uL (ref 0.00–0.07)
Basophils Absolute: 0 10*3/uL (ref 0.0–0.1)
Basophils Relative: 0 %
Eosinophils Absolute: 0.3 10*3/uL (ref 0.0–0.5)
Eosinophils Relative: 4 %
HCT: 41.2 % (ref 36.0–46.0)
Hemoglobin: 13.3 g/dL (ref 12.0–15.0)
Immature Granulocytes: 0 %
Lymphocytes Relative: 22 %
Lymphs Abs: 1.6 10*3/uL (ref 0.7–4.0)
MCH: 31.7 pg (ref 26.0–34.0)
MCHC: 32.3 g/dL (ref 30.0–36.0)
MCV: 98.3 fL (ref 80.0–100.0)
Monocytes Absolute: 0.6 10*3/uL (ref 0.1–1.0)
Monocytes Relative: 9 %
Neutro Abs: 4.8 10*3/uL (ref 1.7–7.7)
Neutrophils Relative %: 65 %
Platelets: 317 10*3/uL (ref 150–400)
RBC: 4.19 MIL/uL (ref 3.87–5.11)
RDW: 13.2 % (ref 11.5–15.5)
WBC: 7.3 10*3/uL (ref 4.0–10.5)
nRBC: 0 % (ref 0.0–0.2)

## 2023-08-15 NOTE — Progress Notes (Signed)
 [facility] FOLLOW UP NOTE  Patient Care Team: Etta Grandchild, MD as PCP - General (Internal Medicine) Norva Pavlov, OD as Consulting Physician (Optometry) Rachel Moulds, MD as Consulting Physician (Hematology and Oncology) Pershing Proud, RN as Oncology Nurse Navigator Abigail Miyamoto, MD as Consulting Physician (General Surgery) Donnelly Angelica, RN as Oncology Nurse Navigator [place], Carlus Pavlov, RPH (Pharmacist) [facility], [facility]  CHIEF COMPLAINTS/PURPOSE OF CONSULTATION:  Marginal zone lymphoma and Newly diagnosed [facility]  ASSESSMENT & PLAN:   This is a very 68 yr old female pt with new diagnosis of small bowel marginal zone lymphoma here for hospital follow up.  Breast Cancer Two small areas in the left breast at 10 o'clock position, one measuring 1 cm and the other 7 mm. Both biopsied and confirmed as breast cancer. Estrogen and progesterone positive, HER2 status negative.  -She is s/p surgery, neg margins, neg LN and is here to review oncotype dx testing -Oncotype score of 28 indicates chemotherapy benefits. -Recommended adj chemotherapy, anticipated to start Apr 21. -She is scheduled for breast reduction tomorrow, she will RTC before April 21 st to make sure she is ready to proceed with chemo and had adequate healing.  Marginal Zone Lymphoma  She originally presented to the emergency room in September with pain in the left upper quadrant and was found to have small bowel obstruction. She had CT abdomen pelvis which showed small bowel obstruction with transition point in the central small bowel mesentery suspicious for adhesion. She had ex lap with small bowel resection and this showed a large small bowel mass which was not obstructing the small bowel lumen. 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 Since her small bowel surgery, she remains asymptomatic, wound has nearly healed. Most recent PET/CT with no signs of tracer avid lymphadenopathy, postop changes noted from the previous small bowel resection, increased uptake along the resection site is nonspecific could be physiological or postsurgical, intense uptake is identified localizing to the descending duodenum where there is circumferential wall thickening identified cannot exclude Endoscopy showed duodenal and gastric biopsies showed Marginal zone lymphoma. Given need for chemotherapy for [facility], and since most marginal zone lymphomas are indolent, we will monitor this for now. We have once again discussed this  Gynecological Concern Activity noted in the vagina on PET scan, likely unrelated to lymphoma or breast cancer. No suspicion of malignancy at this time.  Scheduled for end of April    HISTORY OF PRESENTING ILLNESS:  Kayla Lowe 68 y.o. female is here because of marginal zone lymphoma of small bowel.  Oncology History  Malignant neoplasm of upper-inner quadrant of left breast in female, estrogen receptor positive (HCC)  06/14/2023 Initial Diagnosis   Malignant neoplasm of upper-inner quadrant of left breast in female, estrogen receptor positive (HCC)   06/14/2023 Cancer Staging   Staging form: Breast, AJCC 8th Edition - Clinical stage from 06/14/2023: Stage IA (cT1, cN0, cM0, G3, ER+, PR+, HER2-) - Signed by Rachel Moulds, MD on 06/17/2023 Stage prefix: Initial diagnosis Method of lymph node assessment: Clinical Histologic grading system: 3 grade system   09/02/2023 -  Chemotherapy   Patient is on Treatment Plan : BREAST TC q21d      Discussed the use of AI scribe software for clinical note transcription with the patient, who gave verbal consent to proceed.  History  of Present Illness    The patient, with breast cancer and marginal zone lymphoma, presents for follow-up and  management.  Discussed the use of AI scribe software for clinical note transcription with the patient, who gave verbal consent to proceed.  History of Present Illness Kayla Lowe is a 68 year old female with breast cancer and marginal zone lymphoma who presents for pre-surgical evaluation and follow-up. She is healing well from previous procedures and is preparing for chemotherapy to start in mid-April, followed by radiation therapy. She experiences night sweats, which she associates with 'hot flashes'.  She has a history of marginal zone lymphoma, which is currently indolent.   There is some concern about vaginal activity, but it is not currently being actively addressed. She has FU with gynecology.  No issues with bowel movements or urination. She has a hernia that she describes as 'deep', but otherwise reports no significant gastrointestinal symptoms. No fever or changes in appetite.   Rest of the pertinent 10 point ROS reviewed and neg.  MEDICAL HISTORY:  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   Osteoarthritis     SURGICAL HISTORY: Past Surgical History:  Procedure Laterality Date   ABDOMINAL HYSTERECTOMY  1987   BREAST BIOPSY Left 05/28/2023   [place] LT BREAST BX W LOC DEV EA ADD LESION IMG BX SPEC US GUIDE 05/28/2023 [facility]   BREAST BIOPSY Left 05/28/2023   [place] LT BREAST BX W LOC DEV 1ST LESION IMG BX SPEC US GUIDE 05/28/2023 [facility]   BREAST BIOPSY Left 07/02/2023   [place] LT RADIOACTIVE SEED EA ADD LESION 07/02/2023 [facility]   BREAST BIOPSY Left 07/02/2023   [place] LT RADIOACTIVE SEED LOC 07/02/2023 [facility]   BREAST LUMPECTOMY WITH RADIOACTIVE SEED AND SENTINEL LYMPH NODE BIOPSY Left  07/03/2023   Procedure: RADIOACTIVE SEED GUIDED LEFT BREAST LUMPECTOMY x2 AND LEFT SENTINEL NODE BIOPSY;  Surgeon: Abigail Miyamoto, MD;  Location: [facility] OR;  Service: General;  Laterality: Left;  LMA   LAPAROTOMY N/A 02/04/2023   Procedure: EXPLORATORY LAPAROTOMY, WITH SMALL BOWEL RESECTION;  Surgeon: Abigail Miyamoto, MD;  Location: [facility];  Service: General;  Laterality: N/A;    SOCIAL HISTORY: Social History   Socioeconomic History   Marital status: Legally Separated    Spouse name: Not on file   Number of children: 3   Years of education: Not on file   Highest education level: Not on file  Occupational History   Occupation: RETIRED/nanny  Tobacco Use   Smoking status: Never   Smokeless tobacco: Never  Vaping Use   Vaping status: Never Used  Substance and Sexual Activity   Alcohol use: No   Drug use: No   Sexual activity: Not Currently    Birth control/protection: Surgical    Comment: Hyst  Other Topics Concern   Not on file  Social History Narrative   Lives with a roomate   Social Drivers of Health   Financial Resource Strain: Medium Risk (08/07/2023)   Overall Financial Resource Strain (CARDIA)    Difficulty of Paying Living Expenses: Somewhat hard  Food Insecurity: No Food Insecurity (08/07/2023)   Hunger Vital Sign    Worried About Running  Out of Food in the Last Year: Never true    Ran Out of Food in the Last Year: Never true  Recent Concern: Food Insecurity - Food Insecurity Present (06/14/2023)   Hunger Vital Sign    Worried About Running Out of Food in the Last Year: Sometimes true    Ran Out of Food in the Last Year: Sometimes true  Transportation Needs: No Transportation Needs (08/07/2023)   PRAPARE - Administrator, Civil Service (Medical): No    Lack of Transportation (Non-Medical): No  Physical Activity: Inactive (08/07/2023)   Exercise Vital Sign    Days of Exercise per Week: 0 days    Minutes of Exercise per Session: 0 min  Stress: No Stress  Concern Present (08/07/2023)   [employer] of Occupational Health - Occupational Stress Questionnaire    Feeling of Stress : Not at all  Social Connections: Moderately Integrated (08/07/2023)   Social Connection and Isolation Panel [NHANES]    Frequency of Communication with Friends and Family: More than three times a week    Frequency of Social Gatherings with Friends and Family: Not on file    Attends Religious Services: More than 4 times per year    Active Member of [employer] or Organizations: Yes    Attends Banker Meetings: Never    Marital Status: Separated  Intimate Partner Violence: Patient Unable To Answer (08/07/2023)   Humiliation, Afraid, Rape, and Kick questionnaire    Fear of Current or Ex-Partner: Patient unable to answer    Emotionally Abused: Patient unable to answer    Physically Abused: Patient unable to answer    Sexually Abused: Patient unable to answer    FAMILY HISTORY: Family History  Problem Relation Age of Onset   Cancer Father    Hypertension Brother    Diabetes Brother    Diabetes Paternal Grandfather    Diabetes Paternal Uncle    Breast cancer Paternal Aunt    Colon cancer Neg Hx    Hypercalcemia Neg Hx     ALLERGIES:  is allergic to cozaar [losartan potassium], lisinopril, and statins.  MEDICATIONS:  Current Outpatient Medications  Medication Sig Dispense Refill   acetaminophen (TYLENOL) 500 MG tablet Take 2 tablets (1,000 mg total) by mouth 4 (four) times daily. (Patient taking differently: Take 1,000 mg by mouth 3 (three) times daily.) 30 tablet 0   aspirin EC 81 MG tablet Take 81 mg by mouth daily. Swallow whole.     carvedilol (COREG) 6.25 MG tablet Take 1 tablet (6.25 mg total) by mouth 2 (two) times daily with a meal. 180 tablet 0   cetirizine (ZYRTEC) 10 MG tablet Take 10 mg by mouth daily.     Cholecalciferol (VITAMIN D) 50 MCG (2000 UT) CAPS Take 1 capsule by mouth daily.     ezetimibe (ZETIA) 10 MG tablet TAKE 1 TABLET(10  MG) BY MOUTH DAILY 90 tablet 0   famotidine (PEPCID) 40 MG tablet TAKE 1 TABLET(40 MG) BY MOUTH AT BEDTIME 90 tablet 1   Ferric Maltol (ACCRUFER) 30 MG CAPS Take 1 capsule (30 mg total) by mouth in the morning and at bedtime. 180 capsule 0   fluticasone (FLONASE) 50 MCG/ACT nasal spray Place 2 sprays into both nostrils daily. 48 g 1   hydrALAZINE (APRESOLINE) 25 MG tablet TAKE 1 TABLET(25 MG) BY MOUTH TWO TIMES DAILY     montelukast (SINGULAIR) 10 MG tablet Take 1 tablet (10 mg total) by mouth at bedtime. 90 tablet 1  ondansetron (ZOFRAN) 4 MG tablet Take 1 tablet (4 mg total) by mouth every 8 (eight) hours as needed for nausea or vomiting. 20 tablet 0   pantoprazole (PROTONIX) 20 MG tablet TAKE 1 TABLET(20 MG) BY MOUTH DAILY 90 tablet 1   pravastatin (PRAVACHOL) 40 MG tablet TAKE 1 TABLET(40 MG) BY MOUTH DAILY 90 tablet 1   tiZANidine (ZANAFLEX) 2 MG tablet Take 1 tablet (2 mg total) by mouth every 8 (eight) hours as needed for muscle spasms. 270 tablet 0   traMADol (ULTRAM) 50 MG tablet Take 1 tablet (50 mg total) by mouth every 6 (six) hours as needed. 270 tablet 0   triamcinolone cream (KENALOG) 0.5 % Apply 1 Application topically as needed (Dermatitis).     triamterene-hydrochlorothiazide (DYAZIDE) 37.5-25 MG capsule Take 1 each (1 capsule total) by mouth daily. 90 capsule 0   Current Facility-Administered Medications  Medication Dose Route Frequency Provider Last Rate Last Admin   0.9 %  sodium chloride infusion  500 mL Intravenous Once Pyrtle, Carie Caddy, MD       Facility-Administered Medications Ordered in Other Visits  Medication Dose Route Frequency Provider Last Rate Last Admin   Chlorhexidine Gluconate Cloth 2 % PADS 6 each  6 each Topical Once Santiago Glad, MD       And   Chlorhexidine Gluconate Cloth 2 % PADS 6 each  6 each Topical Once Santiago Glad, MD          PHYSICAL EXAMINATION: ECOG PERFORMANCE STATUS: 2 - Symptomatic, <50% confined to bed  Vitals:   08/15/23  1100  BP: (!) 134/53  Pulse: 69  Resp: 17  Temp: (!) 97 F (36.1 C)  SpO2: 99%    Filed Weights   08/15/23 1100  Weight: 196 lb 11.2 oz (89.2 kg)    GENERAL:alert, no distress and comfortable Left breast and axillary wound healing well. No palpable adenopathy Chest: CTA bilaterally Heart: RRR No LE edema  LABORATORY DATA:  I have reviewed the data as listed Lab Results  Component Value Date   WBC 7.3 08/15/2023   HGB 13.3 08/15/2023   HCT 41.2 08/15/2023   MCV 98.3 08/15/2023   PLT 317 08/15/2023     Chemistry      Component Value Date/Time   NA 139 08/13/2023 1530   NA 145 (H) 07/29/2018 1112   K 4.2 08/13/2023 1530   CL 106 08/13/2023 1530   CO2 25 08/13/2023 1530   BUN 11 08/13/2023 1530   BUN 8 07/29/2018 1112   CREATININE 1.09 (H) 08/13/2023 1530   CREATININE 0.85 04/18/2023 1039   CREATININE 0.87 08/03/2015 0942      Component Value Date/Time   CALCIUM 10.7 (H) 08/13/2023 1530   ALKPHOS 100 04/18/2023 1039   AST 11 (L) 04/18/2023 1039   ALT 7 04/18/2023 1039   BILITOT 0.3 04/18/2023 1039       RADIOGRAPHIC STUDIES: I have personally reviewed the radiological images as listed and agreed with the findings in the report. No results found.   All questions were answered. The patient knows to call the clinic with any problems, questions or concerns.     Rachel Moulds, MD 08/15/2023 11:08 AM

## 2023-08-16 ENCOUNTER — Encounter: Payer: Self-pay | Admitting: *Deleted

## 2023-08-16 ENCOUNTER — Encounter (HOSPITAL_BASED_OUTPATIENT_CLINIC_OR_DEPARTMENT_OTHER)
Admission: RE | Disposition: A | Discharge status: Home / Self Care | Payer: Self-pay | Source: Home / Self Care | Attending: Plastic Surgery

## 2023-08-16 ENCOUNTER — Other Ambulatory Visit: Payer: Self-pay

## 2023-08-16 ENCOUNTER — Ambulatory Visit (HOSPITAL_BASED_OUTPATIENT_CLINIC_OR_DEPARTMENT_OTHER): Admitting: Anesthesiology

## 2023-08-16 ENCOUNTER — Ambulatory Visit (HOSPITAL_BASED_OUTPATIENT_CLINIC_OR_DEPARTMENT_OTHER)
Admission: RE | Admit: 2023-08-16 | Discharge: 2023-08-16 | Disposition: A | Discharge status: Home / Self Care | Attending: Plastic Surgery | Admitting: Plastic Surgery

## 2023-08-16 ENCOUNTER — Encounter: Admitting: Surgical

## 2023-08-16 ENCOUNTER — Encounter (HOSPITAL_BASED_OUTPATIENT_CLINIC_OR_DEPARTMENT_OTHER): Payer: Self-pay | Admitting: Plastic Surgery

## 2023-08-16 DIAGNOSIS — Z7982 Long term (current) use of aspirin: Secondary | ICD-10-CM | POA: Diagnosis not present

## 2023-08-16 DIAGNOSIS — Z9889 Other specified postprocedural states: Secondary | ICD-10-CM | POA: Diagnosis not present

## 2023-08-16 DIAGNOSIS — C50912 Malignant neoplasm of unspecified site of left female breast: Secondary | ICD-10-CM | POA: Diagnosis not present

## 2023-08-16 DIAGNOSIS — N62 Hypertrophy of breast: Secondary | ICD-10-CM | POA: Insufficient documentation

## 2023-08-16 DIAGNOSIS — K219 Gastro-esophageal reflux disease without esophagitis: Secondary | ICD-10-CM | POA: Diagnosis not present

## 2023-08-16 DIAGNOSIS — C50212 Malignant neoplasm of upper-inner quadrant of left female breast: Secondary | ICD-10-CM | POA: Diagnosis not present

## 2023-08-16 DIAGNOSIS — E785 Hyperlipidemia, unspecified: Secondary | ICD-10-CM | POA: Insufficient documentation

## 2023-08-16 DIAGNOSIS — Z01818 Encounter for other preprocedural examination: Secondary | ICD-10-CM

## 2023-08-16 DIAGNOSIS — M542 Cervicalgia: Secondary | ICD-10-CM | POA: Insufficient documentation

## 2023-08-16 DIAGNOSIS — J45909 Unspecified asthma, uncomplicated: Secondary | ICD-10-CM

## 2023-08-16 DIAGNOSIS — Z8572 Personal history of non-Hodgkin lymphomas: Secondary | ICD-10-CM | POA: Diagnosis not present

## 2023-08-16 DIAGNOSIS — Z853 Personal history of malignant neoplasm of breast: Secondary | ICD-10-CM | POA: Insufficient documentation

## 2023-08-16 DIAGNOSIS — M546 Pain in thoracic spine: Secondary | ICD-10-CM | POA: Diagnosis not present

## 2023-08-16 DIAGNOSIS — N6489 Other specified disorders of breast: Secondary | ICD-10-CM | POA: Insufficient documentation

## 2023-08-16 DIAGNOSIS — Z79899 Other long term (current) drug therapy: Secondary | ICD-10-CM | POA: Diagnosis not present

## 2023-08-16 DIAGNOSIS — M1990 Unspecified osteoarthritis, unspecified site: Secondary | ICD-10-CM | POA: Insufficient documentation

## 2023-08-16 DIAGNOSIS — D509 Iron deficiency anemia, unspecified: Secondary | ICD-10-CM | POA: Diagnosis not present

## 2023-08-16 DIAGNOSIS — I10 Essential (primary) hypertension: Secondary | ICD-10-CM | POA: Insufficient documentation

## 2023-08-16 DIAGNOSIS — Z170 Estrogen receptor positive status [ER+]: Secondary | ICD-10-CM | POA: Diagnosis not present

## 2023-08-16 HISTORY — PX: BREAST REDUCTION SURGERY: SHX8

## 2023-08-16 SURGERY — MAMMOPLASTY, REDUCTION
Anesthesia: General | Site: Breast | Laterality: Bilateral

## 2023-08-16 MED ORDER — ROCURONIUM BROMIDE 100 MG/10ML IV SOLN
INTRAVENOUS | Status: DC | PRN
  Administered 2023-08-16: 50 mg via INTRAVENOUS
  Administered 2023-08-16: 20 mg via INTRAVENOUS

## 2023-08-16 MED ORDER — OXYCODONE HCL 5 MG PO TABS
5.0000 mg | ORAL_TABLET | Freq: Once | ORAL | Status: AC | PRN
Start: 2023-08-16 — End: 2023-08-16
  Administered 2023-08-16: 5 mg via ORAL

## 2023-08-16 MED ORDER — PROPOFOL 10 MG/ML IV BOLUS
INTRAVENOUS | Status: DC | PRN
  Administered 2023-08-16: 200 mg via INTRAVENOUS

## 2023-08-16 MED ORDER — HYDROMORPHONE HCL 1 MG/ML IJ SOLN
INTRAMUSCULAR | Status: AC
  Filled 2023-08-16: qty 0.5

## 2023-08-16 MED ORDER — SODIUM CHLORIDE (PF) 0.9 % IJ SOLN
INTRAMUSCULAR | Status: DC | PRN
  Administered 2023-08-16: 100 mL

## 2023-08-16 MED ORDER — KETOROLAC TROMETHAMINE 30 MG/ML IJ SOLN
15.0000 mg | Freq: Once | INTRAMUSCULAR | Status: AC | PRN
  Administered 2023-08-16: 15 mg via INTRAVENOUS

## 2023-08-16 MED ORDER — FENTANYL CITRATE (PF) 100 MCG/2ML IJ SOLN
INTRAMUSCULAR | Status: AC
  Filled 2023-08-16: qty 2

## 2023-08-16 MED ORDER — ROCURONIUM BROMIDE 10 MG/ML (PF) SYRINGE
PREFILLED_SYRINGE | INTRAVENOUS | Status: AC
  Filled 2023-08-16: qty 20

## 2023-08-16 MED ORDER — CEFAZOLIN SODIUM-DEXTROSE 2-4 GM/100ML-% IV SOLN
INTRAVENOUS | Status: AC
  Filled 2023-08-16: qty 100

## 2023-08-16 MED ORDER — CEFAZOLIN SODIUM-DEXTROSE 2-4 GM/100ML-% IV SOLN
2.0000 g | INTRAVENOUS | Status: AC
  Administered 2023-08-16: 2 g via INTRAVENOUS

## 2023-08-16 MED ORDER — HYDROMORPHONE HCL 1 MG/ML IJ SOLN
INTRAMUSCULAR | Status: DC | PRN
  Administered 2023-08-16: .5 mg via INTRAVENOUS

## 2023-08-16 MED ORDER — ONDANSETRON HCL 4 MG/2ML IJ SOLN
INTRAMUSCULAR | Status: DC | PRN
  Administered 2023-08-16: 4 mg via INTRAVENOUS

## 2023-08-16 MED ORDER — HYDROMORPHONE HCL 1 MG/ML IJ SOLN
0.2500 mg | INTRAMUSCULAR | Status: DC | PRN
  Administered 2023-08-16 (×3): 0.5 mg via INTRAVENOUS

## 2023-08-16 MED ORDER — DEXAMETHASONE SODIUM PHOSPHATE 10 MG/ML IJ SOLN
INTRAMUSCULAR | Status: AC
  Filled 2023-08-16: qty 1

## 2023-08-16 MED ORDER — FENTANYL CITRATE (PF) 100 MCG/2ML IJ SOLN
25.0000 ug | INTRAMUSCULAR | Status: DC | PRN
  Administered 2023-08-16 (×2): 50 ug via INTRAVENOUS

## 2023-08-16 MED ORDER — OXYCODONE HCL 5 MG/5ML PO SOLN: 5.0000 mg | Freq: Once | ORAL | Status: AC | PRN

## 2023-08-16 MED ORDER — LACTATED RINGERS IV SOLN: INTRAVENOUS | Status: DC

## 2023-08-16 MED ORDER — FENTANYL CITRATE (PF) 100 MCG/2ML IJ SOLN
INTRAMUSCULAR | Status: DC | PRN
  Administered 2023-08-16 (×4): 25 ug via INTRAVENOUS
  Administered 2023-08-16: 100 ug via INTRAVENOUS

## 2023-08-16 MED ORDER — ROCURONIUM BROMIDE 10 MG/ML (PF) SYRINGE
PREFILLED_SYRINGE | INTRAVENOUS | Status: AC
  Filled 2023-08-16: qty 10

## 2023-08-16 MED ORDER — ACETAMINOPHEN 10 MG/ML IV SOLN
INTRAVENOUS | Status: AC
  Filled 2023-08-16: qty 100

## 2023-08-16 MED ORDER — EPHEDRINE SULFATE (PRESSORS) 50 MG/ML IJ SOLN
INTRAMUSCULAR | Status: DC | PRN
  Administered 2023-08-16: 10 mg via INTRAVENOUS

## 2023-08-16 MED ORDER — PHENYLEPHRINE HCL (PRESSORS) 10 MG/ML IV SOLN
INTRAVENOUS | Status: DC | PRN
  Administered 2023-08-16 (×3): 80 ug via INTRAVENOUS

## 2023-08-16 MED ORDER — DEXAMETHASONE SODIUM PHOSPHATE 4 MG/ML IJ SOLN
INTRAMUSCULAR | Status: DC | PRN
Start: 2023-08-16 — End: 2023-08-16
  Administered 2023-08-16: 5 mg via INTRAVENOUS

## 2023-08-16 MED ORDER — EPHEDRINE 5 MG/ML INJ
INTRAVENOUS | Status: AC
  Filled 2023-08-16: qty 5

## 2023-08-16 MED ORDER — ONDANSETRON HCL 4 MG/2ML IJ SOLN
INTRAMUSCULAR | Status: AC
  Filled 2023-08-16: qty 2

## 2023-08-16 MED ORDER — ACETAMINOPHEN 10 MG/ML IV SOLN
1000.0000 mg | Freq: Once | INTRAVENOUS | Status: AC
  Administered 2023-08-16: 1000 mg via INTRAVENOUS

## 2023-08-16 MED ORDER — ACETAMINOPHEN 500 MG PO TABS
1000.0000 mg | ORAL_TABLET | Freq: Once | ORAL | Status: AC
  Administered 2023-08-16: 1000 mg via ORAL

## 2023-08-16 MED ORDER — ACETAMINOPHEN 500 MG PO TABS
ORAL_TABLET | ORAL | Status: AC
  Filled 2023-08-16: qty 2

## 2023-08-16 MED ORDER — PHENYLEPHRINE 80 MCG/ML (10ML) SYRINGE FOR IV PUSH (FOR BLOOD PRESSURE SUPPORT)
PREFILLED_SYRINGE | INTRAVENOUS | Status: AC
  Filled 2023-08-16: qty 10

## 2023-08-16 MED ORDER — OXYCODONE HCL 5 MG PO TABS
ORAL_TABLET | ORAL | Status: AC
  Filled 2023-08-16: qty 1

## 2023-08-16 MED ORDER — 0.9 % SODIUM CHLORIDE (POUR BTL) OPTIME
TOPICAL | Status: DC | PRN
  Administered 2023-08-16 (×2): 1000 mL

## 2023-08-16 MED ORDER — KETOROLAC TROMETHAMINE 30 MG/ML IJ SOLN
INTRAMUSCULAR | Status: AC
  Filled 2023-08-16: qty 1

## 2023-08-16 MED ORDER — SUGAMMADEX SODIUM 200 MG/2ML IV SOLN
INTRAVENOUS | Status: DC | PRN
  Administered 2023-08-16: 200 mg via INTRAVENOUS

## 2023-08-16 MED ORDER — HYDROMORPHONE HCL 1 MG/ML IJ SOLN
INTRAMUSCULAR | Status: AC
Start: 2023-08-16 — End: ?
  Filled 2023-08-16: qty 0.5

## 2023-08-16 MED ORDER — AMISULPRIDE (ANTIEMETIC) 5 MG/2ML IV SOLN: 10.0000 mg | Freq: Once | INTRAVENOUS | Status: DC | PRN

## 2023-08-16 SURGICAL SUPPLY — 60 items
BINDER BREAST 3XL (GAUZE/BANDAGES/DRESSINGS) IMPLANT
BINDER BREAST LRG (GAUZE/BANDAGES/DRESSINGS) IMPLANT
BINDER BREAST MEDIUM (GAUZE/BANDAGES/DRESSINGS) IMPLANT
BINDER BREAST XLRG (GAUZE/BANDAGES/DRESSINGS) IMPLANT
BINDER BREAST XXLRG (GAUZE/BANDAGES/DRESSINGS) IMPLANT
BIOPATCH RED 1 DISK 7.0 (GAUZE/BANDAGES/DRESSINGS) ×4 IMPLANT
BLADE HEX COATED 2.75 (ELECTRODE) IMPLANT
BLADE SURG 10 STRL SS (BLADE) ×12 IMPLANT
BLADE SURG 15 STRL LF DISP TIS (BLADE) ×2 IMPLANT
CANISTER SUCT 1200ML W/VALVE (MISCELLANEOUS) ×2 IMPLANT
DERMABOND ADVANCED .7 DNX12 (GAUZE/BANDAGES/DRESSINGS) ×4 IMPLANT
DRAIN CHANNEL 19F RND (DRAIN) ×4 IMPLANT
DRAPE IMP U-DRAPE 54X76 (DRAPES) IMPLANT
DRAPE UTILITY XL STRL (DRAPES) ×2 IMPLANT
DRSG TEGADERM 2-3/8X2-3/4 SM (GAUZE/BANDAGES/DRESSINGS) IMPLANT
DRSG TEGADERM 4X4.75 (GAUZE/BANDAGES/DRESSINGS) ×4 IMPLANT
ELECT BLADE 4.0 EZ CLEAN MEGAD (MISCELLANEOUS) ×2 IMPLANT
ELECT REM PT RETURN 9FT ADLT (ELECTROSURGICAL) ×2 IMPLANT
ELECTRODE BLDE 4.0 EZ CLN MEGD (MISCELLANEOUS) ×2 IMPLANT
ELECTRODE REM PT RTRN 9FT ADLT (ELECTROSURGICAL) ×4 IMPLANT
EVACUATOR SILICONE 100CC (DRAIN) ×4 IMPLANT
GAUZE PAD ABD 8X10 STRL (GAUZE/BANDAGES/DRESSINGS) ×4 IMPLANT
GAUZE SPONGE 2X2 12PLY NS (GAUZE/BANDAGES/DRESSINGS) IMPLANT
GAUZE SPONGE 2X2 STRL 8-PLY (GAUZE/BANDAGES/DRESSINGS) ×4 IMPLANT
GLOVE BIO SURGEON STRL SZ 6.5 (GLOVE) IMPLANT
GLOVE BIO SURGEON STRL SZ8 (GLOVE) ×2 IMPLANT
GLOVE BIOGEL PI IND STRL 7.0 (GLOVE) IMPLANT
GLOVE BIOGEL PI IND STRL 8 (GLOVE) ×2 IMPLANT
GOWN STRL REUS W/ TWL LRG LVL3 (GOWN DISPOSABLE) ×2 IMPLANT
GOWN STRL REUS W/TWL XL LVL3 (GOWN DISPOSABLE) ×2 IMPLANT
HEMOSTAT ARISTA ABSORB 3G PWDR (HEMOSTASIS) IMPLANT
HIBICLENS CHG 4% 4OZ BTL (MISCELLANEOUS) ×2 IMPLANT
MANIFOLD NEPTUNE II (INSTRUMENTS) IMPLANT
MARKER SKIN DUAL TIP RULER LAB (MISCELLANEOUS) ×2 IMPLANT
NDL HYPO 22X1.5 SAFETY MO (MISCELLANEOUS) ×4 IMPLANT
NEEDLE HYPO 22X1.5 SAFETY MO (MISCELLANEOUS) ×2 IMPLANT
NS IRRIG 1000ML POUR BTL (IV SOLUTION) ×2 IMPLANT
PACK BASIN DAY SURGERY FS (CUSTOM PROCEDURE TRAY) ×2 IMPLANT
PACK UNIVERSAL I (CUSTOM PROCEDURE TRAY) ×2 IMPLANT
PENCIL SMOKE EVACUATOR (MISCELLANEOUS) ×4 IMPLANT
PIN SAFETY STERILE (MISCELLANEOUS) ×2 IMPLANT
SLEEVE SCD COMPRESS KNEE MED (STOCKING) ×2 IMPLANT
SPONGE T-LAP 18X18 ~~LOC~~+RFID (SPONGE) ×6 IMPLANT
STAPLER SKIN PROX WIDE 3.9 (STAPLE) ×2 IMPLANT
STRIP CLOSURE SKIN 1/2X4 (GAUZE/BANDAGES/DRESSINGS) IMPLANT
SUT MNCRL AB 3-0 PS2 27 (SUTURE) ×8 IMPLANT
SUT MNCRL AB 4-0 PS2 18 (SUTURE) ×8 IMPLANT
SUT MON AB 2-0 CT1 36 (SUTURE) ×2 IMPLANT
SUT MON AB 5-0 PS2 18 (SUTURE) IMPLANT
SUT SILK 2 0 SH (SUTURE) ×4 IMPLANT
SUT VIC AB 3-0 SH 27X BRD (SUTURE) IMPLANT
SYR 20ML LL LF (SYRINGE) ×4 IMPLANT
SYR BULB IRRIG 60ML STRL (SYRINGE) ×2 IMPLANT
SYR CONTROL 10ML LL (SYRINGE) ×2 IMPLANT
TAPE MEASURE VINYL STERILE (MISCELLANEOUS) IMPLANT
TOWEL GREEN STERILE FF (TOWEL DISPOSABLE) ×4 IMPLANT
TRAY DSU PREP LF (CUSTOM PROCEDURE TRAY) ×2 IMPLANT
TUBE CONNECTING 20X1/4 (TUBING) ×2 IMPLANT
UNDERPAD 30X36 HEAVY ABSORB (UNDERPADS AND DIAPERS) ×4 IMPLANT
YANKAUER SUCT BULB TIP NO VENT (SUCTIONS) ×2 IMPLANT

## 2023-08-16 NOTE — Op Note (Signed)
 DATE OF OPERATION: 08/16/2023  LOCATION: [facility] operating Room  PREOPERATIVE DIAGNOSIS: Left breast cancer, macromastia  POSTOPERATIVE DIAGNOSIS: Same  PROCEDURE: Oncoplastic breast reduction  SURGEON: Loren Racer, MD  ASSISTANT: Caroline More  EBL: 100 cc  CONDITION: Stable  COMPLICATIONS: None  INDICATION: The patient, Kayla Lowe, is a 68 y.o. female born on 06-Nov-1955, is here for treatment of contour defect secondary to lumpectomy and upper back and neck pain.   PROCEDURE DETAILS:  The patient was seen prior to surgery and marked.  The IV antibiotics were given. The patient was taken to the operating room and given a general anesthetic. A standard time out was performed and all information was confirmed by those in the room. SCDs were placed.   The chest was prepped and draped in usual sterile manner.  A 42 mm cookie cutter was used to outline both nipple areolar complexes and an 8 cm based inferior pedicle was outlined on each breast.  A laparotomy tape was placed at the base of breast as a tourniquet and the pedicle was de-epithelialized sharply.  Electrocautery was used to dissect the borders of the pedicle to the chest wall and the electrocautery was used to resect the medial lateral and superior triangles of breast tissue.  The superior skin flap was developed and thinned.  The tissue removed constituted the bulk of the reduction and weighed 1129 g.  The subfascial space over the pectoralis muscle and the subcutaneous tissues were then infiltrated with 50 mL of a mixture of quarter percent plain Marcaine Exparel and saline.  The surgical wound was irrigated with warm normal saline.  Meticulous hemostasis was achieved with the electrocautery.  A 19 French round drain was placed behind the pedicle and brought out through a separate stab incision.  The T point was approximated with a single 2-0 Monocryl suture and the skin edges tailor tacked in place with skin clips.   The dermis was closed with interrupted and running 3-0 Monocryl sutures and the skin was closed a running 4-0 Monocryl subcuticular stitch.  Attention was turned to the left breast where similar procedure was performed.  After placing a laparotomy tape at the base of the breast pedicle was de-epithelialized sharply and dissected around its borders.  The medial lateral and superior triangles of breast tissue were excised with the electrocautery.  The superior skin flap was then developed and thinned.  In the process of developing the skin flap a small seroma cavity from Ms. Hopkins prior lumpectomy was encountered.  The contents of the seroma cavity and a portion of the wall of the seroma cavity were sent to pathology separate for routine examination.  The remainder of the cavity was fulgurated with the electrocautery.  Additionally while elevating the superior skin flap along the axillary fold the tethering from the axillary dissection was released to improve the contour.  Once the skin flap was thinned the tissue removed constituted the bulk of the reduction and weighed 1084 g.  The tissue was sent to pathology for routine examination.  The fascia over the pectoralis and the subcutaneous tissue were infiltrated with the Exparel mixture.  Again the surgical wound was irrigated with warm normal saline and hemostasis achieved with the electrocautery.  A 19 French round drain was placed behind the pedicle and brought out through a separate stab incision.  The T point was approximated with a single 2-0 Monocryl suture and the skin edges tailor tacked in place with skin clips.  The dermis  was closed with interrupted and running 3-0 Monocryl sutures and the skin was closed with a running 4-0 Monocryl subcuticular stitch.  All incisions were sealed with Dermabond.  The patient was placed in a supportive compressive garment and awakened from anesthesia without incident.  She was transferred to the recovery room in good  condition.  All instrument needle and sponge counts were reported as correct and no complications were appreciated during the procedure. The patient was allowed to wake up and taken to recovery room in stable condition at the end of the case. The family was notified at the end of the case.   The advanced practice practitioner (APP) assisted throughout the case.  The APP was essential in retraction and counter traction when needed to make the case progress smoothly.  This retraction and assistance made it possible to see the tissue plans for the procedure.  The assistance was needed for blood control, tissue re-approximation and assisted with closure of the incision site.

## 2023-08-16 NOTE — Anesthesia Preprocedure Evaluation (Signed)
 Anesthesia Evaluation  Patient identified by MRN, date of birth, ID band Patient awake    Reviewed: Allergy & Precautions, H&P , NPO status , Patient's Chart, lab work & pertinent test results  Airway Mallampati: II  TM Distance: >3 FB Neck ROM: Full    Dental no notable dental hx. (+) Edentulous Upper, Edentulous Lower, Dental Advisory Given   Pulmonary asthma    Pulmonary exam normal breath sounds clear to auscultation       Cardiovascular Exercise Tolerance: Good hypertension, Pt. on medications and Pt. on home beta blockers  Rhythm:Regular Rate:Normal     Neuro/Psych negative neurological ROS  negative psych ROS   GI/Hepatic Neg liver ROS,GERD  Medicated,,  Endo/Other  negative endocrine ROS    Renal/GU negative Renal ROS  negative genitourinary   Musculoskeletal  (+) Arthritis , Osteoarthritis,    Abdominal   Peds  Hematology  (+) Blood dyscrasia, anemia   Anesthesia Other Findings   Reproductive/Obstetrics negative OB ROS                             Anesthesia Physical Anesthesia Plan  ASA: 3  Anesthesia Plan: General   Post-op Pain Management: Tylenol PO (pre-op)*   Induction: Intravenous  PONV Risk Score and Plan: 4 or greater and Ondansetron, Dexamethasone and Treatment may vary due to age or medical condition  Airway Management Planned: Oral ETT  Additional Equipment:   Intra-op Plan:   Post-operative Plan: Extubation in OR  Informed Consent: I have reviewed the patients History and Physical, chart, labs and discussed the procedure including the risks, benefits and alternatives for the proposed anesthesia with the patient or authorized representative who has indicated his/her understanding and acceptance.     Dental advisory given  Plan Discussed with: CRNA  Anesthesia Plan Comments:        Anesthesia Quick Evaluation

## 2023-08-16 NOTE — Discharge Instructions (Addendum)
 INSTRUCTIONS FOR AFTER BREAST SURGERY   You will likely have some questions about what to expect following your operation.  The following information will help you and your family understand what to expect when you are discharged from the hospital.  It is important to follow these guidelines to help ensure a smooth recovery and reduce complication.  Postoperative instructions include information on: diet, wound care, medications and physical activity.  AFTER SURGERY Expect to go home after the procedure.  In some cases, you may need to spend one night in the hospital for observation.  DIET Breast surgery does not require a specific diet.  However, the healthier you eat the better your body will heal. It is important to increasing your protein intake.  This means limiting the foods with sugar and carbohydrates.  Focus on vegetables and some meat.  If you have liposuction during your procedure be sure to drink water.  If your urine is bright yellow, then it is concentrated, and you need to drink more water.  As a general rule after surgery, you should have 8 ounces of water every hour while awake.  If you find you are persistently nauseated or unable to take in liquids let us know.  NO TOBACCO USE or EXPOSURE.  This will slow your healing process and lead to a wound.  WOUND CARE Leave the binder on at all times except when showering . Use fragrance free soap like Dial, Dove or [place].   After 24 hours you can remove the binder to shower. Once dry apply binder or sports bra. No baths, pools or hot tubs for four weeks. We close your incision to leave the smallest and best-looking scar. No ointment or creams on your incisions for four weeks.  No Neosporin (Too many skin reactions).  A few weeks after surgery you can use Mederma and start massaging the scar. We ask you to wear your binder or sports bra for the first 6 weeks around the clock, including while sleeping. This provides added comfort and helps  reduce the fluid accumulation at the surgery site. NO Ice or heating pads to the operative site.  You have a very high risk of a BURN before you feel the temperature change. Continue to empty, recharge, & record drainage from drains 2-3 times a day, as needed.   ACTIVITY No heavy lifting until cleared by the doctor.  This usually means no more than a half-gallon of milk.  It is OK to walk and climb stairs. Moving your legs is very important to decrease your risk of a blood clot.  It will also help keep you from getting deconditioned.  Every 1 to 2 hours get up and walk for 5 minutes. This will help with a quicker recovery back to normal.  Let pain be your guide so you don't do too much.  This time is for you to recover.  You will be more comfortable if you sleep and rest with your head elevated either with a few pillows under you or in a recliner.  No stomach sleeping for a three months.  WORK Everyone returns to work at different times. As a rough guide, most people take at least 1 - 2 weeks off prior to returning to work. If you need documentation for your job, give the forms to the front staff at the clinic.  DRIVING Arrange for someone to bring you home from the hospital after your surgery.  You may be able to drive a few days  after surgery but not while taking any narcotics or valium.  BOWEL MOVEMENTS Constipation can occur after anesthesia and while taking pain medication.  It is important to stay ahead for your comfort.  We recommend taking Milk of Magnesia (2 tablespoons; twice a day) while taking the pain pills.  MEDICATIONS You may be prescribed should start after surgery At your preoperative visit for you history and physical you may have been given the following medications: Zofran 4 mg:  This is to treat nausea and vomiting.  You can take this every 6 hours as needed and only if needed. Oxycodone 5 mg:  This is only to be used after you have taken the Motrin or the Tylenol. Every 8  hours as needed.   Over the counter Medication to take: Ibuprofen (Motrin) 600 mg:  Take this every 6 hours.  If you have additional pain then take 500 mg of the Tylenol every 8 hours.  Only take the Norco after you have tried these two. MiraLAX or Milk of Magnesia: Take this according to the bottle if you take the Norco.  WHEN TO CALL Call your surgeon's office if any of the following occur: Fever 101 degrees F or greater Excessive bleeding or fluid from the incision site. Pain that increases over time without aid from the medications Redness, warmth, or pus draining from incision sites Persistent nausea or inability to take in liquids Severe misshapen area that underwent the operation.  Here are some resources for breast cancer patients:  Plastic surgery website: https://www.plasticsurgery.org/for-medical-professionals/education-and-resources/publications/breast-reconstruction-magazine Breast Reconstruction Awareness Campaign:  [employer]fr Plastic surgery Implant information:  https://www.plasticsurgery.org/patient-safety/breast-implant-safety    Post Anesthesia Home Care Instructions  Activity: Get plenty of rest for the remainder of the day. A responsible individual must stay with you for 24 hours following the procedure.  For the next 24 hours, DO NOT: -Drive a car -Advertising copywriter -Drink alcoholic beverages -Take any medication unless instructed by your physician -Make any legal decisions or sign important papers.  Meals: Start with liquid foods such as gelatin or soup. Progress to regular foods as tolerated. Avoid greasy, spicy, heavy foods. If nausea and/or vomiting occur, drink only clear liquids until the nausea and/or vomiting subsides. Call your physician if vomiting continues.  Special Instructions/Symptoms: Your throat may feel dry or sore from the anesthesia or the breathing tube placed in your throat during surgery. If this causes discomfort,  gargle with warm salt water. The discomfort should disappear within 24 hours.  If you had a scopolamine patch placed behind your ear for the management of post- operative nausea and/or vomiting:  1. The medication in the patch is effective for 72 hours, after which it should be removed.  Wrap patch in a tissue and discard in the trash. Wash hands thoroughly with soap and water. 2. You may remove the patch earlier than 72 hours if you experience unpleasant side effects which may include dry mouth, dizziness or visual disturbances. 3. Avoid touching the patch. Wash your hands with soap and water after contact with the patch.  *May have Tylenol and Ibuprofen today at 11pm    Information for Discharge Teaching: EXPAREL (bupivacaine liposome injectable suspension)   Pain relief is important to your recovery. The goal is to control your pain so you can move easier and return to your normal activities as soon as possible after your procedure. Your physician may use several types of medicines to manage pain, swelling, and more.  Your surgeon or anesthesiologist gave you EXPAREL(bupivacaine) to help  control your pain after surgery.  EXPAREL is a local anesthetic designed to release slowly over an extended period of time to provide pain relief by numbing the tissue around the surgical site. EXPAREL is designed to release pain medication over time and can control pain for up to 72 hours. Depending on how you respond to EXPAREL, you may require less pain medication during your recovery. EXPAREL can help reduce or eliminate the need for opioids during the first few days after surgery when pain relief is needed the most. EXPAREL is not an opioid and is not addictive. It does not cause sleepiness or sedation.   Important! A teal colored band has been placed on your arm with the date, time and amount of EXPAREL you have received. Please leave this armband in place for the full 96 hours following  administration, and then you may remove the band. If you return to the hospital for any reason within 96 hours following the administration of EXPAREL, the armband provides important information that your health care providers to know, and alerts them that you have received this anesthetic.    Possible side effects of EXPAREL: Temporary loss of sensation or ability to move in the area where medication was injected. Nausea, vomiting, constipation Rarely, numbness and tingling in your mouth or lips, lightheadedness, or anxiety may occur. Call your doctor right away if you think you may be experiencing any of these sensations, or if you have other questions regarding possible side effects.  Follow all other discharge instructions given to you by your surgeon or nurse. Eat a healthy diet and drink plenty of water or other fluids.    JP Drain [employer] this sheet to all of your post-operative appointments while you have your drains. Please measure your drains by CC's or ML's. Make sure you drain and measure your JP Drains 2 or 3 times per day. At the end of each day, add up totals for the left side and add up totals for the right side.    ( 9 am )     ( 3 pm )        ( 9 pm )                Date L  R  L  R  L  R  Total L/R                                                                                                                                                                                       About my Jackson-Pratt Bulb Drain  What is a Jackson-Pratt bulb? A Jackson-Pratt is a soft,  round device used to collect drainage. It is connected to a long, thin drainage catheter, which is held in place by one or two small stiches near your surgical incision site. When the bulb is squeezed, it forms a vacuum, forcing the drainage to empty into the bulb.  Emptying the Jackson-Pratt bulb- To empty the bulb: 1. Release the plug on the top of the bulb. 2. Pour the bulb's contents  into a measuring container which your nurse will provide. 3. Record the time emptied and amount of drainage. Empty the drain(s) as often as your     doctor or nurse recommends.  Date                  Time                    Amount (Drain 1)                 Amount (Drain 2)  _____________________________________________________________________  _____________________________________________________________________  _____________________________________________________________________  _____________________________________________________________________  _____________________________________________________________________  _____________________________________________________________________  _____________________________________________________________________  _____________________________________________________________________  Squeezing the Jackson-Pratt Bulb- To squeeze the bulb: 1. Make sure the plug at the top of the bulb is open. 2. Squeeze the bulb tightly in your fist. You will hear air squeezing from the bulb. 3. Replace the plug while the bulb is squeezed. 4. Use a safety pin to attach the bulb to your clothing. This will keep the catheter from     pulling at the bulb insertion site.  When to call your doctor- Call your doctor if: Drain site becomes red, swollen or hot. You have a fever greater than 101 degrees F. There is oozing at the drain site. Drain falls out (apply a guaze bandage over the drain hole and secure it with tape). Drainage increases daily not related to activity patterns. (You will usually have more drainage when you are active than when you are resting.) Drainage has a bad odor.

## 2023-08-16 NOTE — Transfer of Care (Signed)
 Immediate Anesthesia Transfer of Care Note  Patient: Kayla Lowe  Procedure(s) Performed: MAMMOPLASTY, REDUCTION (Bilateral: Breast)  Patient Location: PACU  Anesthesia Type:General  Level of Consciousness: awake, alert , and patient cooperative  Airway & Oxygen Therapy: Patient Spontanous Breathing and Patient connected to nasal cannula oxygen  Post-op Assessment: Report given to RN and Post -op Vital signs reviewed and stable  Post vital signs: Reviewed and stable  Last Vitals:  Vitals Value Taken Time  BP    Temp    Pulse 74 08/16/23 1620  Resp 10 08/16/23 1620  SpO2 100 % 08/16/23 1620  Vitals shown include unfiled device data.  Last Pain:  Vitals:   08/16/23 1035  TempSrc:   PainSc: 0-No pain      Patients Stated Pain Goal: 3 (08/16/23 1031)  Complications: No notable events documented.

## 2023-08-16 NOTE — Anesthesia Procedure Notes (Signed)
 Procedure Name: Intubation Date/Time: 08/16/2023 12:17 PM  Performed by: Jessica Priest, CRNAPre-anesthesia Checklist: Patient identified, Emergency Drugs available, Suction available, Patient being monitored and Timeout performed Patient Re-evaluated:Patient Re-evaluated prior to induction Oxygen Delivery Method: Circle system utilized Preoxygenation: Pre-oxygenation with 100% oxygen Induction Type: IV induction Ventilation: Mask ventilation without difficulty Laryngoscope Size: Mac and 3 Grade View: Grade II Tube type: Oral Tube size: 7.0 mm Number of attempts: 1 Airway Equipment and Method: Stylet and Oral airway Placement Confirmation: ETT inserted through vocal cords under direct vision, positive ETCO2, breath sounds checked- equal and bilateral and CO2 detector Secured at: 22 cm Tube secured with: Tape Dental Injury: Teeth and Oropharynx as per pre-operative assessment

## 2023-08-16 NOTE — Interval H&P Note (Signed)
 History and Physical Interval Note: No change in exam or indication for surgery All questions answered Ms Kayla Lowe was marked for a bilateral breast reduction with her assistance Will proceed at her request.  08/16/2023 11:52 AM  Kayla Lowe  has presented today for surgery, with the diagnosis of Macromastia.  The various methods of treatment have been discussed with the patient and family. After consideration of risks, benefits and other options for treatment, the patient has consented to  Procedure(s): MAMMOPLASTY, REDUCTION (Bilateral) as a surgical intervention.  The patient's history has been reviewed, patient examined, no change in status, stable for surgery.  I have reviewed the patient's chart and labs.  Questions were answered to the patient's satisfaction.     Santiago Glad

## 2023-08-17 NOTE — Anesthesia Postprocedure Evaluation (Signed)
 Anesthesia Post Note  Patient: Kayla Lowe  Procedure(s) Performed: MAMMOPLASTY, REDUCTION (Bilateral: Breast)     Patient location during evaluation: PACU Anesthesia Type: General Level of consciousness: awake and alert Pain management: pain level controlled Vital Signs Assessment: post-procedure vital signs reviewed and stable Respiratory status: spontaneous breathing, nonlabored ventilation, respiratory function stable and patient connected to nasal cannula oxygen Cardiovascular status: blood pressure returned to baseline and stable Postop Assessment: no apparent nausea or vomiting Anesthetic complications: no   No notable events documented.  Last Vitals:  Vitals:   08/16/23 1730 08/16/23 1740  BP:  (!) 159/73  Pulse: 68 80  Resp: 13 18  Temp:  36.6 C  SpO2: 99% 97%    Last Pain:  Vitals:   08/16/23 1740  TempSrc: Temporal  PainSc: 4                  Kennieth Rad

## 2023-08-18 ENCOUNTER — Encounter (HOSPITAL_BASED_OUTPATIENT_CLINIC_OR_DEPARTMENT_OTHER): Payer: Self-pay | Admitting: Plastic Surgery

## 2023-08-19 ENCOUNTER — Ambulatory Visit: Admitting: Surgical

## 2023-08-19 VITALS — BP 150/78 | HR 82

## 2023-08-19 DIAGNOSIS — C50212 Malignant neoplasm of upper-inner quadrant of left female breast: Secondary | ICD-10-CM

## 2023-08-19 DIAGNOSIS — N62 Hypertrophy of breast: Secondary | ICD-10-CM

## 2023-08-19 DIAGNOSIS — Z170 Estrogen receptor positive status [ER+]: Secondary | ICD-10-CM

## 2023-08-19 NOTE — Progress Notes (Signed)
 Patient is a very pleasant 68 year old female here for follow-up after bilateral oncoplastic breast reduction on 08/16/2023 with Dr. Ladona Ridgel.  She reports she is overall doing well, reports she does have some mild pain but this is overall well-controlled at this time.  She reports positive flatus and normal urination.  No BMs yet.  She is not having any infectious symptoms. JP drain output has been approximately 35 cc per 24 hours on each side.  Chaperone present on exam Bilateral NAC's are viable, bilateral breast incisions are intact and appear to be healing well.  Bilateral JP drains in place with scant amount of serosanguineous fluid in each bulb.  There is no erythema or cellulitic changes noted of either breast.  No subcutaneous fluid collection noted palpation.  Mild tenderness with palpation.  A/P:  Patient is overall doing well, continue with compressive garments, avoid strenuous activities or heavy lifting.  We will plan to leave JP drains in place for 2 more days and have patient follow-up on Wednesday for removal.  No signs of infection or concern on exam.  Recommend calling with questions or concerns.

## 2023-08-20 LAB — SURGICAL PATHOLOGY

## 2023-08-21 ENCOUNTER — Ambulatory Visit (INDEPENDENT_AMBULATORY_CARE_PROVIDER_SITE_OTHER): Admitting: Surgical

## 2023-08-21 ENCOUNTER — Encounter: Payer: Self-pay | Admitting: Surgical

## 2023-08-21 DIAGNOSIS — C50212 Malignant neoplasm of upper-inner quadrant of left female breast: Secondary | ICD-10-CM

## 2023-08-21 DIAGNOSIS — N62 Hypertrophy of breast: Secondary | ICD-10-CM

## 2023-08-21 DIAGNOSIS — Z170 Estrogen receptor positive status [ER+]: Secondary | ICD-10-CM

## 2023-08-21 NOTE — Progress Notes (Signed)
 68-year-old female here for follow-up after bilateral composite breast reduction on 08/16/2023 with Dr. Ladona Ridgel.  She is 5 days postop.  She is overall doing well, she is here for JP drain removal today.  JP drain output has been 10 to 15 cc per 24 hours per each drain.  She is not having any infectious symptoms.  She does report some ongoing discomfort from the drains.  Chaperone present on exam On exam bilateral NAC's are viable, bilateral breast incisions are intact and appear to be healing well.  She has bilateral JP drains in place with serosanguineous drainage in bulb.  No erythema or cellulitic changes noted of either breast.  No subcutaneous fluid collection noted palpation.  She does have a small blister of the right lateral breast/axilla just posterior to the lateral incision.  There is no surrounding redness or concern.  A/P:  Patient is overall doing well, pain is overall well-controlled.  JP drains were removed without issue, she tolerated this well.  Recommend following up next week for reevaluation.  She has an appointment scheduled for Monday to see Dr. Ladona Ridgel.  No signs infection or concern on exam.  Call with questions or concerns.

## 2023-08-24 ENCOUNTER — Other Ambulatory Visit: Payer: Self-pay | Admitting: Internal Medicine

## 2023-08-24 DIAGNOSIS — K219 Gastro-esophageal reflux disease without esophagitis: Secondary | ICD-10-CM

## 2023-08-26 ENCOUNTER — Encounter: Payer: Self-pay | Admitting: Plastic Surgery

## 2023-08-26 ENCOUNTER — Ambulatory Visit (INDEPENDENT_AMBULATORY_CARE_PROVIDER_SITE_OTHER): Admitting: Plastic Surgery

## 2023-08-26 VITALS — BP 116/76 | HR 83

## 2023-08-26 DIAGNOSIS — C50212 Malignant neoplasm of upper-inner quadrant of left female breast: Secondary | ICD-10-CM

## 2023-08-26 DIAGNOSIS — Z170 Estrogen receptor positive status [ER+]: Secondary | ICD-10-CM

## 2023-08-26 DIAGNOSIS — Z9889 Other specified postprocedural states: Secondary | ICD-10-CM

## 2023-08-26 DIAGNOSIS — N62 Hypertrophy of breast: Secondary | ICD-10-CM

## 2023-08-26 NOTE — Progress Notes (Signed)
 Pharmacist Chemotherapy Monitoring - Initial Assessment    Anticipated start date: 09/02/23   The following has been reviewed per standard work regarding the patient's treatment regimen: The patient's diagnosis, treatment plan and drug doses, and organ/hematologic function Lab orders and baseline tests specific to treatment regimen  The treatment plan start date, drug sequencing, and pre-medications Prior authorization status  Patient's documented medication list, including drug-drug interaction screen and prescriptions for anti-emetics and supportive care specific to the treatment regimen The drug concentrations, fluid compatibility, administration routes, and timing of the medications to be used The patient's access for treatment and lifetime cumulative dose history, if applicable  The patient's medication allergies and previous infusion related reactions, if applicable   Changes made to treatment plan:    Follow up needed:  prescriptions needed for anti-emetics and signing treatment plan (sign and release of pretreatment day)   Kayla Lowe, PharmD, MBA

## 2023-08-27 ENCOUNTER — Other Ambulatory Visit: Admitting: Pharmacist

## 2023-08-27 ENCOUNTER — Telehealth: Payer: Self-pay | Admitting: Internal Medicine

## 2023-08-27 DIAGNOSIS — I10 Essential (primary) hypertension: Secondary | ICD-10-CM

## 2023-08-27 NOTE — Telephone Encounter (Unsigned)
 Copied from CRM [id number]. Topic: General - Other >> Aug 27, 2023  8:41 AM [place] A wrote: Reason for CRM: Patient called to see when she could pick up Ferric Maltol (ACCRUFER) 30 MG CAPS samples that doctor has been providing for her. Please contact via myChart or phone call

## 2023-08-27 NOTE — Progress Notes (Signed)
 08/27/2023 Name: Kayla Lowe MRN: [id number] DOB: [birth year]  Chief Complaint  Patient presents with   Hypertension   Medication Management    Kayla Lowe is a 68 y.o. year old female who presented for a telephone visit.   They were referred to the pharmacist by their PCP for assistance in managing hypertension.    Subjective:  Care Team: Primary Care Provider: Etta Grandchild, MD ; Next Scheduled Visit: 09/16/23   Medication Access/Adherence  Current Pharmacy:  [id number] [place], [place] - [address] AT [facility] [address] [place] [address] Phone: [phone] Fax: [phone]  [facility] [place], [place] - [address] [phone] [place] [address] Phone: [phone] Fax: [phone]   Patient reports affordability concerns with their medications: No  Patient reports access/transportation concerns to their pharmacy: No  Patient reports adherence concerns with their medications:  No    Pt has a lot going on right now with  2 recent cancer diagnoses. Just had lumpectomy surgery yesterday and has more follow up with plastic surgery and oncology upcoming.   Hypertension:  Current medications: carvedilol 6.25 mg twice daily, hydralazine 25 mg BID, triamterene/hydrochlorothiazide 37/25 mg daily (started 06/19/23) Medications previously tried: intolerance to losartan and lisinopril  Patient does not have a validated, automated, upper arm home BP cuff. She has been using a wrist cuff. She reports BP have been low for the past 2 weeks. Current blood pressure readings:  3/29 1 PM 124/72, 6 PM 128/74 3/30 11 AM 144/80, 9 PM 124/74 3/31 10 AM 136/80, 129/76 4 PM, 5 PM 128/68 4/1 9 AM 140/76 4/2 12 PM 132/76, 4 PM 128/74 4/3 9 AM 146/78, 4 PM 132/76 4/5 11 AM 116/65, 7 PM 132/67 4/6 11 AM 138/74,  4/7 12 PM 129/65 4/8 12 PM 124/68 4/9 8 AM 155/78 4/10  11 AM 124/67, 4 PM 111/62 4/12 11 AM 130/78, 9 PM 138/76 4/13 10 Am 164/80, 9 PM 139/79 4/14 9 AM 135/69  Patient denies hypotensive s/sx including dizziness, lightheadedness.     Objective: BP Readings from Last 3 Encounters:  08/26/23 116/76  08/19/23 (!) 150/78  08/16/23 (!) 159/73     Lab Results  Component Value Date   HGBA1C 5.4 08/31/2019    Lab Results  Component Value Date   CREATININE 1.09 (H) 08/13/2023   BUN 11 08/13/2023   NA 139 08/13/2023   K 4.2 08/13/2023   CL 106 08/13/2023   CO2 25 08/13/2023    Lab Results  Component Value Date   CHOL 177 03/13/2023   HDL 49.20 03/13/2023   LDLCALC 94 03/13/2023   LDLDIRECT 220.0 09/16/2019   TRIG 167.0 (H) 03/13/2023   CHOLHDL 4 03/13/2023    Medications Reviewed Today     Reviewed by Bonita Quin, RPH (Pharmacist) on 08/27/23 at 1107  Med List Status: <None>   Medication Order Taking? Sig Documenting Provider Last Dose Status Informant  0.9 %  sodium chloride infusion [id number]   Pyrtle, Carie Caddy, MD  Active   acetaminophen (TYLENOL) 500 MG tablet [id number]  Take 2 tablets (1,000 mg total) by mouth 4 (four) times daily. Alwyn Ren, MD  Active Self, Pharmacy Records  aspirin EC 81 MG tablet [id number]  Take 81 mg by mouth daily. Swallow whole. [provider]  Active Self, Pharmacy Records  carvedilol (COREG) 6.25 MG tablet [id number] Yes  Take 1 tablet (6.25 mg total) by mouth 2 (two) times daily with a meal. Arcadio Knuckles, MD Taking Active Self, Pharmacy Records  cetirizine (ZYRTEC) 10 MG tablet [id number]  Take 10 mg by mouth daily. [provider]  Active Self, Pharmacy Records  Cholecalciferol (VITAMIN D) 50 MCG (2000 UT) CAPS [id number]  Take 1 capsule by mouth daily. [provider]  Active Self, Pharmacy Records  ezetimibe (ZETIA) 10 MG tablet [id number]  TAKE 1 TABLET(10 MG) BY MOUTH DAILY Arcadio Knuckles, MD  Active   famotidine (PEPCID) 40 MG tablet [id number]   TAKE 1 TABLET(40 MG) BY MOUTH AT BEDTIME Arcadio Knuckles, MD  Active Self, Pharmacy Records  Ferric Maltol (ACCRUFER) 30 MG CAPS [id number]  Take 1 capsule (30 mg total) by mouth in the morning and at bedtime. Arcadio Knuckles, MD  Active Self, Pharmacy Records  fluticasone [facility]) 50 MCG/ACT nasal spray [id number]  Place 2 sprays into both nostrils daily. Arcadio Knuckles, MD  Active Self, Pharmacy Records  hydrALAZINE (APRESOLINE) 25 MG tablet [id number] Yes TAKE 1 TABLET(25 MG) BY MOUTH TWO TIMES DAILY Arcadio Knuckles, MD Taking Active   montelukast (SINGULAIR) 10 MG tablet [id number]  Take 1 tablet (10 mg total) by mouth at bedtime. Arcadio Knuckles, MD  Active Self, Pharmacy Records  ondansetron (ZOFRAN) 4 MG tablet [id number]  Take 1 tablet (4 mg total) by mouth every 8 (eight) hours as needed for nausea or vomiting. Scheeler, Janalyn Me, PA-C  Active   pantoprazole (PROTONIX) 20 MG tablet [id number]  TAKE 1 TABLET(20 MG) BY MOUTH DAILY Arcadio Knuckles, MD  Active Self, Pharmacy Records  pravastatin (PRAVACHOL) 40 MG tablet [id number]  TAKE 1 TABLET(40 MG) BY MOUTH DAILY Arcadio Knuckles, MD  Active Self, Pharmacy Records  tiZANidine (ZANAFLEX) 2 MG tablet [id number]  Take 1 tablet (2 mg total) by mouth every 8 (eight) hours as needed for muscle spasms. Arcadio Knuckles, MD  Active Self, Pharmacy Records  traMADol [facility]) 50 MG tablet [id number]  Take 1 tablet (50 mg total) by mouth every 6 (six) hours as needed. Arcadio Knuckles, MD  Active Self, Pharmacy Records  triamcinolone cream (KENALOG) 0.5 % [id number]  Apply 1 Application topically as needed (Dermatitis). [provider]  Active Self, Pharmacy Records  triamterene-hydrochlorothiazide (DYAZIDE) 37.5-25 MG capsule [id number] Yes Take 1 each (1 capsule total) by mouth daily. Arcadio Knuckles, MD Taking Active Self, Pharmacy Records              Assessment/Plan:   Hypertension: - Currently controlled, BP goal <130/80 without hypotension.  Average appears to be right around 130/80. Recent OV BP was very good. - Reviewed appropriate blood pressure monitoring technique and reviewed goal blood pressure. Recommended to check home blood pressure and heart rate.  - Recent BMP shows Scr and K stable - Recommend to continue current regimen. Notify us  if seeing Bps >140/90 that are not coming down    Follow Up Plan: PRN  Rainelle Bur, PharmD, BCPS, CPP Clinical Pharmacist Practitioner [facility] [phone]

## 2023-08-27 NOTE — Telephone Encounter (Signed)
 Patient has been made aware that she can come in the office and pick up her samples. Please find me when she gets here.

## 2023-08-27 NOTE — Patient Instructions (Signed)
 It was a pleasure speaking with you today!  Continue current medications. Continue monitoring blood pressure and call us  if you are having blood pressure above 140/90 that is not coming back down.  Feel free to call with any questions or concerns!  Rainelle Bur, PharmD, BCPS, CPP Clinical Pharmacist Practitioner [facility] [phone]

## 2023-08-28 ENCOUNTER — Inpatient Hospital Stay

## 2023-08-28 ENCOUNTER — Inpatient Hospital Stay: Admitting: Pharmacist

## 2023-08-28 DIAGNOSIS — Z170 Estrogen receptor positive status [ER+]: Secondary | ICD-10-CM

## 2023-08-28 DIAGNOSIS — C50212 Malignant neoplasm of upper-inner quadrant of left female breast: Secondary | ICD-10-CM

## 2023-08-28 DIAGNOSIS — C8309 Small cell B-cell lymphoma, extranodal and solid organ sites: Secondary | ICD-10-CM | POA: Diagnosis not present

## 2023-08-28 MED ORDER — PROCHLORPERAZINE MALEATE 10 MG PO TABS: 10.0000 mg | ORAL_TABLET | Freq: Four times a day (QID) | ORAL | 1 refills | Status: DC | PRN

## 2023-08-28 MED ORDER — ONDANSETRON HCL 8 MG PO TABS
8.0000 mg | ORAL_TABLET | Freq: Three times a day (TID) | ORAL | 1 refills | Status: DC | PRN
Start: 2023-08-28 — End: 2023-09-11

## 2023-08-28 MED ORDER — DEXAMETHASONE 4 MG PO TABS: ORAL_TABLET | ORAL | 1 refills | Status: DC

## 2023-08-28 NOTE — Progress Notes (Signed)
 Ms. Wayne Haines returns 12 days postop from bilateral breast reduction for treatment of upper back and neck pain and to improve contour abnormalities after lumpectomy.  She states she is doing well though she is having to deal with "her small breasts".  Pain is well-controlled.  On examination I feel she has a very nice size for her body habitus with good symmetry.  The contour defect in the left breast is completely filled.  The incisions are clean dry and intact.  Nipples are warm and well-perfused.  Status post oncoplastic breast reduction.  I have discussed scar massage with her as well as what to expect as the skin relaxes and the breast begin to have their normal ptosis.  I believe that she will be overall very happy with the results.  Keep scheduled appointment.  Call for any questions or concerns.

## 2023-08-28 NOTE — Progress Notes (Signed)
 [facility]       Telephone: [phone]?Fax: [phone]   Oncology Clinical Pharmacist Practitioner Initial Assessment  Kayla Lowe is a 68 y.o. female with a diagnosis of breast cancer. They were contacted today via in-person visit. She is accompanied by her daughter Kayla Lowe  Indication/Regimen Docetaxel (Taxotere) and cyclophosphamide (Cytoxan) are being used appropriately for treatment of breast cancer by Dr. Rachel Moulds.      Wt Readings from Last 1 Encounters:  08/16/23 195 lb 8.8 oz (88.7 kg)    Estimated body surface area is 1.99 meters squared as calculated from the following:   Height as of 08/16/23: 5' 3.5" (1.613 m).   Weight as of 08/16/23: 195 lb 8.8 oz (88.7 kg).  The dosing regimen cycle is every 21 days x 4 cycles  Docetaxel (75 mg/m2) on Day 1 Cyclophosphamide (600 mg/m2) on Day 1 Pegfilgrastim (6 mg) on Day 3  It is planned to continue until treatment plan completion or unacceptable toxicity. The tentative start date is: 09/02/23  Dose Modifications None   Allergies Allergies  Allergen Reactions   Cozaar [Losartan Potassium] Other (See Comments)    Caused cough and sweats. Tingling in legs.   Lisinopril Cough   Statins Other (See Comments)    myalgias    Vitals    08/26/2023   10:02 AM 08/19/2023    9:26 AM 08/16/2023    5:40 PM  Oncology Vitals  Temp   97.9 F (36.6 C)  Pulse Rate 83 82 80  BP 116/76 150/78 159/73  Resp   18  SpO2 100 % 98 % 97 %     Laboratory Data    Latest Ref Rng & Units 08/15/2023   10:41 AM 06/28/2023    9:00 AM 04/18/2023   10:39 AM  CBC EXTENDED  WBC 4.0 - 10.5 K/uL 7.3  5.0  4.3   RBC 3.87 - 5.11 MIL/uL 4.19  4.65  4.09   Hemoglobin 12.0 - 15.0 g/dL 38.7  56.4  33.2   HCT 36.0 - 46.0 % 41.2  44.8  38.6   Platelets 150 - 400 K/uL 317  405  339   NEUT# 1.7 - 7.7 K/uL 4.8   2.4   Lymph# 0.7 - 4.0 K/uL 1.6   1.3        Latest Ref Rng & Units 08/13/2023    3:30 PM 06/28/2023     9:00 AM 04/18/2023   10:39 AM  CMP  Glucose 70 - 99 mg/dL 86  951  884   BUN 8 - 23 mg/dL 11  13  10    Creatinine 0.44 - 1.00 mg/dL 1.66  0.63  0.16   Sodium 135 - 145 mmol/L 139  138  142   Potassium 3.5 - 5.1 mmol/L 4.2  4.3  3.8   Chloride 98 - 111 mmol/L 106  100  109   CO2 22 - 32 mmol/L 25  29  27    Calcium 8.9 - 10.3 mg/dL 01.0  93.2  35.5   Total Protein 6.5 - 8.1 g/dL   7.3   Total Bilirubin <1.2 mg/dL   0.3   Alkaline Phos 38 - 126 U/L   100   AST 15 - 41 U/L   11   ALT 0 - 44 U/L   7    Contraindications Contraindications were reviewed? Yes Contraindications to therapy were identified? No   Safety Precautions The following safety precautions were reviewed:  Fever: reviewed the  importance of having a thermometer and the Centers for Disease Control and Prevention (CDC) definition of fever which is 100.74F (38C) or higher. Patient should call 24/7 triage at [phone] if experiencing a fever or any other symptoms Decreased white blood cells (WBCs) and increased risk for infection Decreased platelet count and increased risk of bleeding Decreased hemoglobin, part of the red blood cells that carry iron and oxygen Hair Loss Fatigue Fluid retention or swelling (edema) Peripheral Neuropathy Mouth sores Rash or itchy skin Muscle or joint pain or weakness Nausea or vomiting Diarrhea Nail Changes Hypersensitivity reactions Secondary Malignancies Hemorrhagic cystitis Pneumonitis Handling body fluids and waste Intimacy, sexual activity, contraception, fertility  Medication Reconciliation Current Outpatient Medications  Medication Sig Dispense Refill   acetaminophen (TYLENOL) 500 MG tablet Take 2 tablets (1,000 mg total) by mouth 4 (four) times daily. 30 tablet 0   aspirin EC 81 MG tablet Take 81 mg by mouth daily. Swallow whole.     carvedilol (COREG) 6.25 MG tablet Take 1 tablet (6.25 mg total) by mouth 2 (two) times daily with a meal. 180 tablet 0   cetirizine  (ZYRTEC) 10 MG tablet Take 10 mg by mouth daily.     Cholecalciferol (VITAMIN D) 50 MCG (2000 UT) CAPS Take 1 capsule by mouth daily.     ezetimibe (ZETIA) 10 MG tablet TAKE 1 TABLET(10 MG) BY MOUTH DAILY 90 tablet 0   famotidine (PEPCID) 40 MG tablet TAKE 1 TABLET(40 MG) BY MOUTH AT BEDTIME 90 tablet 1   Ferric Maltol (ACCRUFER) 30 MG CAPS Take 1 capsule (30 mg total) by mouth in the morning and at bedtime. 180 capsule 0   fluticasone (FLONASE) 50 MCG/ACT nasal spray Place 2 sprays into both nostrils daily. 48 g 1   hydrALAZINE (APRESOLINE) 25 MG tablet TAKE 1 TABLET(25 MG) BY MOUTH TWO TIMES DAILY     montelukast (SINGULAIR) 10 MG tablet Take 1 tablet (10 mg total) by mouth at bedtime. 90 tablet 1   ondansetron (ZOFRAN) 4 MG tablet Take 1 tablet (4 mg total) by mouth every 8 (eight) hours as needed for nausea or vomiting. 20 tablet 0   pantoprazole (PROTONIX) 20 MG tablet TAKE 1 TABLET(20 MG) BY MOUTH DAILY 90 tablet 1   pravastatin (PRAVACHOL) 40 MG tablet TAKE 1 TABLET(40 MG) BY MOUTH DAILY 90 tablet 1   tiZANidine (ZANAFLEX) 2 MG tablet Take 1 tablet (2 mg total) by mouth every 8 (eight) hours as needed for muscle spasms. 270 tablet 0   traMADol (ULTRAM) 50 MG tablet Take 1 tablet (50 mg total) by mouth every 6 (six) hours as needed. 270 tablet 0   triamcinolone cream (KENALOG) 0.5 % Apply 1 Application topically as needed (Dermatitis).     triamterene-hydrochlorothiazide (DYAZIDE) 37.5-25 MG capsule Take 1 each (1 capsule total) by mouth daily. 90 capsule 0   Current Facility-Administered Medications  Medication Dose Route Frequency Provider Last Rate Last Admin   0.9 %  sodium chloride infusion  500 mL Intravenous Once Pyrtle, Amber Bail, MD       Medication reconciliation is based on the patient's most recent medication list in the electronic medical record (EMR) including herbal products and OTC medications.   The patient's medication list was reviewed today with the patient? Yes    Drug-drug interactions (DDIs) DDIs were evaluated? Yes Significant DDIs identified? No   Drug-Food Interactions Drug-food interactions were evaluated? Yes Drug-food interactions identified? No   Follow-up Plan  Treatment start date: 09/02/23 [facility] placement  date: No port per patient Prescriptions sent to local pharmacy of choice We reviewed the prescriptions, premedications, and treatment regimen with the patient. Possible side effects of the treatment regimen were reviewed and management strategies were discussed.  Can use loperamide as needed for diarrhea, loratadine as needed for G-CSF bone pain, and Senna-S as needed for constipation.  She is not interested in pursuing DigniCap at this time Clinical pharmacy will assist Dr. Murleen Arms and Devra Fontana [place]  on an as needed basis going forward  Allani [place]  participated in the discussion, expressed understanding, and voiced agreement with the above plan. All questions were answered to her satisfaction. The patient was advised to contact the clinic at [phone] with any questions or concerns prior to her return visit.   I spent 60 minutes assessing the patient.  Aloma Boch A. Webb Hake, PharmD, BCOP, CPP  Althea Atkinson, RPH-CPP, 08/28/2023 9:12 AM  **Disclaimer: This note was dictated with voice recognition software. Similar sounding words can inadvertently be transcribed and this note may contain transcription errors which may not have been corrected upon publication of note.**

## 2023-08-29 ENCOUNTER — Other Ambulatory Visit: Payer: Self-pay | Admitting: Hematology and Oncology

## 2023-08-29 ENCOUNTER — Ambulatory Visit (INDEPENDENT_AMBULATORY_CARE_PROVIDER_SITE_OTHER): Admitting: Student

## 2023-08-29 ENCOUNTER — Encounter: Payer: Self-pay | Admitting: Student

## 2023-08-29 ENCOUNTER — Encounter: Payer: Self-pay | Admitting: Hematology and Oncology

## 2023-08-29 VITALS — BP 129/84 | HR 66

## 2023-08-29 DIAGNOSIS — N62 Hypertrophy of breast: Secondary | ICD-10-CM

## 2023-08-29 NOTE — Progress Notes (Signed)
 Patient is a 68 year old female with history of left breast cancer and macromastia.  She underwent oncoplastic breast reduction with Dr. Carolynne Citron on 08/16/2023.  Is almost 2 weeks postop and presents to the clinic today with concerns about her incision.  Patient was last seen in the clinic on 08/26/2023.  At this visit, patient reported she was doing well though she was having to deal with "her small breast".  Her pain was well-controlled.  On exam, patient was noted to have good size and good symmetry.  The contour defect on the left breast was completely filled.  The incisions were clean dry and intact.  The nipples were warm and well-perfused.  Today, patient reports she is doing well.  She states that she has chemotherapy coming up and that she noticed a wound to her left breast and was concerned about starting chemotherapy.  She denies any fevers or chills.  She reports that there has been a little bit of drainage from the wound, but nothing significant.  She denies any other issues or concerns at this time.  Chaperone present on exam.  On exam, patient is sitting upright in no acute distress.  Breasts are soft and symmetric.  There is no overlying erythema.  No obvious fluid collections palpated on exam.  NAC's appear to be healthy.  There is a small superficial wound noted at the inferior T-zone of the left breast and what appears to be the beginning of a small superficial wound to the inferior T-zone of the right breast.  There is no surrounding erythema or drainage.  There is no signs of infection.  All other incisions are intact and healing well.  Recommended that patient apply Vaseline and a nonstick daily over her wound and Vaseline throughout her incisions daily.  Patient expressed understanding.  I did discuss starting chemotherapy with Dr. Carolynne Citron.  Recommended that patient wait to start chemotherapy to make sure that wound is not worsening until after we see her at her next appointment.    Patient has an appointment next week.  Will send a message to patient's oncologist with recommendations.  I instructed the patient to call in the meantime she has any questions or concerns about anything.  Pictures were obtained of the patient and placed in the chart with the patient's or guardian's permission.

## 2023-08-30 ENCOUNTER — Telehealth: Payer: Self-pay | Admitting: Internal Medicine

## 2023-08-30 ENCOUNTER — Telehealth: Payer: Self-pay

## 2023-08-30 DIAGNOSIS — J301 Allergic rhinitis due to pollen: Secondary | ICD-10-CM

## 2023-08-30 NOTE — Telephone Encounter (Signed)
 Pt called regarding her appts for Monday. Per Dr Carolynne Citron and Dr Arno Bibles, pt needs to hold tx as she needs further healing from sx before starting. Pt is in agreement. Message sent to our schedulers.

## 2023-09-02 ENCOUNTER — Inpatient Hospital Stay: Admitting: Hematology and Oncology

## 2023-09-02 ENCOUNTER — Inpatient Hospital Stay

## 2023-09-03 NOTE — Telephone Encounter (Signed)
 Copied from CRM [id number]. Topic: Clinical - Prescription Issue >> Sep 03, 2023 10:49 AM Albertha Alosa wrote: Reason for CRM: Patient called in stating she spoke to the pharmacy regarding montelukast  (SINGULAIR ) 10 MG tablet , stated she still needs this and has not been received yet from Dr.Jones .

## 2023-09-04 ENCOUNTER — Ambulatory Visit

## 2023-09-05 ENCOUNTER — Other Ambulatory Visit: Payer: Self-pay

## 2023-09-05 DIAGNOSIS — J301 Allergic rhinitis due to pollen: Secondary | ICD-10-CM

## 2023-09-05 MED ORDER — MONTELUKAST SODIUM 10 MG PO TABS: 10.0000 mg | ORAL_TABLET | Freq: Every day | ORAL | 0 refills | Status: DC

## 2023-09-05 NOTE — Telephone Encounter (Signed)
 Medication has been refilled and patient has been made aware.

## 2023-09-05 NOTE — Progress Notes (Signed)
 Patient is a 68 year old female with history of left breast cancer and macromastia.  She underwent oncoplastic breast reduction with Dr. Carolynne Citron on 08/16/2023.  She is almost 3 weeks postop.  She presents to the clinic today for further follow-up.     Patient was last seen in the clinic on 08/29/2023.  At this visit, patient reported she was doing well.  She had a wound to her left breast and was inquiring about the wound and starting chemotherapy.  On exam, there is a small superficial wound noted to the inferior T-zone of the left breast.  There is a beginning of a small superficial wound noted to the inferior T-zone of the right breast.  It was recommended that patient hold off on chemotherapy to make sure that wound is not worsening.  Message was sent to patient's oncologist.  Today,

## 2023-09-06 ENCOUNTER — Telehealth: Payer: Self-pay | Admitting: Hematology and Oncology

## 2023-09-06 ENCOUNTER — Ambulatory Visit: Admitting: Student

## 2023-09-06 ENCOUNTER — Telehealth: Payer: Self-pay | Admitting: *Deleted

## 2023-09-06 VITALS — BP 136/76 | HR 63

## 2023-09-06 DIAGNOSIS — S21002A Unspecified open wound of left breast, initial encounter: Secondary | ICD-10-CM | POA: Diagnosis not present

## 2023-09-06 DIAGNOSIS — Z9889 Other specified postprocedural states: Secondary | ICD-10-CM

## 2023-09-06 DIAGNOSIS — Z170 Estrogen receptor positive status [ER+]: Secondary | ICD-10-CM

## 2023-09-06 NOTE — Telephone Encounter (Signed)
 Faxed order,demographic,insurance information,and recent office notes to [facility] for supplies for the patient.    Supplies:Xerofrom:Daily                Mepliex:Daily  Confirmation received and copy scanned into the chart.//AB/CMA

## 2023-09-06 NOTE — Telephone Encounter (Signed)
 I called patient. She will be outside the window for the chemotherapy, she is about 10 weeks out next week when she may be ready for chemotherapy. Given oncotype of 28 and since she is outside treatment window, we have discussed to omit chemotherapy and proceed with radiation followed by anti estrogen therapy for 7 yrs. Referral placed to rad onc  Mohammed Mcandrew

## 2023-09-09 ENCOUNTER — Ambulatory Visit

## 2023-09-09 ENCOUNTER — Ambulatory Visit: Admitting: Radiology

## 2023-09-10 ENCOUNTER — Encounter: Payer: Self-pay | Admitting: Radiology

## 2023-09-10 NOTE — Progress Notes (Incomplete)
 Kayla Lowe      Breast Side:Left Side   The patient was available to take my phone call.  Does the patient complain of any of the following:      Breast Tenderness: She reports some tenderness.      Breast Swelling: Denies Lymphadema: Denies Range of Motion limitations: She states some discomfort, but manageable. Appetite good/fair/poor: Good  Additional comments if applicable:Today, patient reports she is doing well. She is 3 weeks postop. Pain is well controlled . She states that her incisions were healing well. She states that her wound is better. Some drainage to left breast but states that it seems to be closing up. She denies any other issues or concerns at this time.

## 2023-09-11 ENCOUNTER — Ambulatory Visit (INDEPENDENT_AMBULATORY_CARE_PROVIDER_SITE_OTHER): Admitting: Obstetrics and Gynecology

## 2023-09-11 ENCOUNTER — Ambulatory Visit: Admitting: Obstetrics and Gynecology

## 2023-09-11 ENCOUNTER — Ambulatory Visit
Admission: RE | Admit: 2023-09-11 | Discharge: 2023-09-11 | Disposition: A | Discharge status: Home / Self Care | Source: Ambulatory Visit | Attending: Radiology | Admitting: Radiology

## 2023-09-11 ENCOUNTER — Other Ambulatory Visit (HOSPITAL_COMMUNITY)
Admission: RE | Admit: 2023-09-11 | Discharge: 2023-09-11 | Disposition: A | Discharge status: Home / Self Care | Source: Ambulatory Visit | Attending: Obstetrics and Gynecology | Admitting: Obstetrics and Gynecology

## 2023-09-11 ENCOUNTER — Encounter: Payer: Self-pay | Admitting: Obstetrics and Gynecology

## 2023-09-11 ENCOUNTER — Ambulatory Visit: Admitting: Radiation Oncology

## 2023-09-11 VITALS — BP 104/62 | HR 78 | Ht 63.0 in | Wt 195.0 lb

## 2023-09-11 VITALS — BP 160/85 | HR 74 | Temp 97.8°F | Resp 18 | Ht 63.0 in | Wt 195.8 lb

## 2023-09-11 DIAGNOSIS — Z124 Encounter for screening for malignant neoplasm of cervix: Secondary | ICD-10-CM

## 2023-09-11 DIAGNOSIS — I10 Essential (primary) hypertension: Secondary | ICD-10-CM | POA: Diagnosis not present

## 2023-09-11 DIAGNOSIS — N842 Polyp of vagina: Secondary | ICD-10-CM

## 2023-09-11 DIAGNOSIS — Z79899 Other long term (current) drug therapy: Secondary | ICD-10-CM | POA: Insufficient documentation

## 2023-09-11 DIAGNOSIS — Z8742 Personal history of other diseases of the female genital tract: Secondary | ICD-10-CM

## 2023-09-11 DIAGNOSIS — Z7982 Long term (current) use of aspirin: Secondary | ICD-10-CM | POA: Insufficient documentation

## 2023-09-11 DIAGNOSIS — K219 Gastro-esophageal reflux disease without esophagitis: Secondary | ICD-10-CM | POA: Diagnosis not present

## 2023-09-11 DIAGNOSIS — Z923 Personal history of irradiation: Secondary | ICD-10-CM | POA: Insufficient documentation

## 2023-09-11 DIAGNOSIS — Z87442 Personal history of urinary calculi: Secondary | ICD-10-CM | POA: Insufficient documentation

## 2023-09-11 DIAGNOSIS — D509 Iron deficiency anemia, unspecified: Secondary | ICD-10-CM | POA: Diagnosis not present

## 2023-09-11 DIAGNOSIS — M1990 Unspecified osteoarthritis, unspecified site: Secondary | ICD-10-CM | POA: Insufficient documentation

## 2023-09-11 DIAGNOSIS — Z170 Estrogen receptor positive status [ER+]: Secondary | ICD-10-CM | POA: Diagnosis not present

## 2023-09-11 DIAGNOSIS — Z803 Family history of malignant neoplasm of breast: Secondary | ICD-10-CM | POA: Insufficient documentation

## 2023-09-11 DIAGNOSIS — C50212 Malignant neoplasm of upper-inner quadrant of left female breast: Secondary | ICD-10-CM | POA: Insufficient documentation

## 2023-09-11 DIAGNOSIS — Z79811 Long term (current) use of aromatase inhibitors: Secondary | ICD-10-CM | POA: Insufficient documentation

## 2023-09-11 DIAGNOSIS — Z809 Family history of malignant neoplasm, unspecified: Secondary | ICD-10-CM | POA: Insufficient documentation

## 2023-09-11 DIAGNOSIS — E785 Hyperlipidemia, unspecified: Secondary | ICD-10-CM | POA: Insufficient documentation

## 2023-09-11 NOTE — Progress Notes (Signed)
     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 Lucienne Ryder -Radioactive seed guided left breast lumpectomy x2 and SLN biopsy 07/03/2023     Medical oncologist, treatment if any:   Dr. Arno Bibles 05/31/2023 -Discussed the need for surgical removal and potential subsequent treatments including chemotherapy and anti-estrogen therapy. -Refer to Dr. Lucienne Ryder for surgical consultation on 06/07/2023. -Consider Oncotype testing post-surgery if her 2 neg to guide decision on chemotherapy.    Family History of Breast/Ovarian/Prostate Cancer: Paternal Aunt had breast cancer.        Kayla Lowe  is here today for follow up post radiation to the breast.  I contacted patient via telephone on 09/10/2022. Verified patient with two identifiers.   Does the patient complain of any of the following: Breast Tenderness: Yes Breast Swelling: Yes Lymphadema: Denies Range of Motion limitations: Denies Appetite good/fair/poor: Good SAFETY ISSUES: Prior radiation? No Pacemaker/ICD? No Possible current pregnancy? Hysterectomy Is the patient on methotrexate? No Additional comments if applicable:Patient overall is healing well. She reports minimal drainage to left breast.  BP (!) 160/85 (BP Location: Right Arm, Patient Position: Sitting, Cuff Size: Large)   Pulse 74   Temp 97.8 F (36.6 C)   Resp 18   Ht 5\' 3"  (1.6 m)   Wt 195 lb 12.8 oz (88.8 kg)   SpO2 99%   BMI 34.68 kg/m

## 2023-09-11 NOTE — Assessment & Plan Note (Signed)
 She was referred by her oncologist, Dr. Arno Bibles for abnormal PET scan findings. She reports a history of prior hysterectomy due to abnormal Pap smears.   Currently asymptomatic 05/06/2023 PET scan: medium size focus of intense radiotracer uptake with SUV max of 17.91, image 165/5. This appears to localize to the right anterolateral wall of the vagina.  Exam reveals a midline vaginal polyp along vaginal cuff. Pap collected today given patient's history of cervical dysplasia. Will perform vaginal biopsy at next appointment. All questions answered.

## 2023-09-11 NOTE — Progress Notes (Signed)
 Radiation Oncology         [phone] ________________________________  Name: Kayla Lowe         MRN: [id number]  Date of Service: 09/11/2023 DOB: 1955/12/19  JX:BJYNW, Randa Burton, MD  Murleen Arms, MD     REFERRING PHYSICIAN: Murleen Arms, MD   DIAGNOSIS: The encounter diagnosis was Malignant neoplasm of upper-inner quadrant of left breast in female, estrogen receptor positive (HCC).   Cancer Staging  Malignant neoplasm of upper-inner quadrant of left breast in female, estrogen receptor positive (HCC) Staging form: Breast, AJCC 8th Edition - Clinical stage from 06/14/2023: Stage IA (cT1, cN0, cM0, G3, ER+, PR+, HER2-) - Signed by Murleen Arms, MD on 06/17/2023 Stage prefix: Initial diagnosis Method of lymph node assessment: Clinical Histologic grading system: 3 grade system - Pathologic stage from 09/11/2023: Stage IB (pT2, pN0, cM0, G3, ER+, PR+, HER2-) - Signed by Pearlene Bouchard, PA-C on 09/11/2023 Stage prefix: Initial diagnosis Histologic grading system: 3 grade system  Stage IB (pT2, pN0, M0) high grade invasive ductal carcinoma, ER/PR+, HER2-  HISTORY OF PRESENT ILLNESS: Kayla Lowe  is a 68 y.o. female with a history of small bowel marginal zone lymphoma; s/p small bowel resection seen for follow-up for her left breast cancer.  Since the patient was last seen by us , she underwent a lumpectomy on 07/02/1013 under the care of Dr. Lucienne Ryder. Pathology showed high grade poorly differentiated invasive ductal adenocarcinoma measuring 2.4 cm in greatest dimension. Tumor was focally present in posterior margin; per Dr. Arvella Bird op note, this was taken off of the chest wall. All lymph nodes were negative for carcinoma. Estrogen and progesterone receptor positive, Ki-67 30% and HER2 negative.    Patient met with Dr. Carolynne Citron on 07/24/2023. Patient decided to proceed with breast reductions to correct the contour defect from her lumpectomy.   Oncotype  testing revealed a score of 28 indicating a benefit from chemotherapy. Dr. Arno Bibles recommended proceeding with docetaxel and cyclophosphamide every 21 days for four cycles. She recommended starting this 2 weeks after her mammoplasty.  Patient underwent mammoplasty with Dr. Carolynne Citron on 08/16/2023. She last saw Lamount Pimple PA on 09/06/2023. Per Dr. Carolynne Citron, the recommendation at that time was to wait to start chemotherapy given her healing course from her breast reduction.   Given the timeframe needed for healing, she was outside the window for chemotherapy. Dr. Arno Bibles discussed this with the patient and they decided to omit chemotherapy. She will complete antiestrogen therapy for 7 years after she finishes her radiation.   Patient reports to be doing well overall today. She denies any breast pain or issues with range of motion. She continues to apply Vaseline and Band-Aids, per recommendations of plastic surgery.   PREVIOUS RADIATION THERAPY: No   PAST MEDICAL HISTORY:  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   Osteoarthritis        PAST SURGICAL HISTORY: Past Surgical History:  Procedure Laterality Date   ABDOMINAL HYSTERECTOMY  1987   BREAST BIOPSY Left 05/28/2023   US  LT BREAST BX W LOC DEV EA ADD LESION IMG BX SPEC US  GUIDE 05/28/2023 [facility]   BREAST BIOPSY Left 05/28/2023   US  LT BREAST BX  W LOC DEV 1ST LESION IMG BX SPEC US  GUIDE 05/28/2023 [facility]   BREAST BIOPSY Left 07/02/2023   US  LT RADIOACTIVE SEED EA ADD LESION 07/02/2023 [facility]   BREAST BIOPSY Left 07/02/2023   US  LT RADIOACTIVE SEED LOC 07/02/2023 [facility]   BREAST LUMPECTOMY WITH RADIOACTIVE SEED AND SENTINEL LYMPH NODE BIOPSY Left  07/03/2023   Procedure: RADIOACTIVE SEED GUIDED LEFT BREAST LUMPECTOMY x2 AND LEFT SENTINEL NODE BIOPSY;  Surgeon: Oza Blumenthal, MD;  Location: [facility];  Service: General;  Laterality: Left;  LMA   BREAST REDUCTION SURGERY Bilateral 08/16/2023   Procedure: MAMMOPLASTY, REDUCTION;  Surgeon: Teretha Ferguson, MD;  Location: [facility];  Service: Plastics;  Laterality: Bilateral;   LAPAROTOMY N/A 02/04/2023   Procedure: EXPLORATORY LAPAROTOMY, WITH SMALL BOWEL RESECTION;  Surgeon: Oza Blumenthal, MD;  Location: [facility];  Service: General;  Laterality: N/A;     FAMILY HISTORY:  Family History  Problem Relation Age of Onset   Cancer Father    Hypertension Brother    Diabetes Brother    Diabetes Paternal Grandfather    Diabetes Paternal Uncle    Breast cancer Paternal Aunt    Colon cancer Neg Hx    Hypercalcemia Neg Hx      SOCIAL HISTORY:  reports that she has never smoked. She has never used smokeless tobacco. She reports that she does not drink alcohol and does not use drugs. She has 3 kids in lives in the [place] area. 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    ALLERGIES: Cozaar  [losartan  potassium], Lisinopril , and Statins   MEDICATIONS:  Current Outpatient Medications  Medication Sig Dispense Refill   acetaminophen  (TYLENOL ) 500 MG tablet Take 2 tablets (1,000 mg total) by mouth 4 (four) times daily. 30 tablet 0   aspirin  EC 81 MG tablet Take 81 mg by mouth daily. Swallow whole.     carvedilol  (COREG ) 6.25 MG tablet Take 1 tablet (6.25 mg total) by mouth 2 (two) times daily with a meal. 180 tablet 0   cetirizine  (ZYRTEC ) 10 MG tablet Take 10 mg by mouth daily.     Cholecalciferol  (VITAMIN D ) 50 MCG (2000 UT) CAPS Take 1 capsule by mouth daily.     ezetimibe  (ZETIA ) 10 MG tablet TAKE 1 TABLET(10 MG) BY MOUTH DAILY 90 tablet 0   famotidine  (PEPCID ) 40 MG tablet TAKE 1 TABLET(40 MG) BY MOUTH AT BEDTIME 90 tablet 1   Ferric Maltol  (ACCRUFER ) 30 MG CAPS Take 1  capsule (30 mg total) by mouth in the morning and at bedtime. 180 capsule 0   fluticasone  (FLONASE ) 50 MCG/ACT nasal spray Place 2 sprays into both nostrils daily. 48 g 1   hydrALAZINE  (APRESOLINE ) 25 MG tablet TAKE 1 TABLET(25 MG) BY MOUTH TWO TIMES DAILY     montelukast  (SINGULAIR ) 10 MG tablet Take 1 tablet (10 mg total) by mouth at bedtime. 90 tablet 0   pantoprazole  (PROTONIX ) 20 MG tablet TAKE 1 TABLET(20 MG) BY MOUTH DAILY 90 tablet 1   pravastatin  (PRAVACHOL ) 40 MG tablet TAKE 1 TABLET(40 MG) BY MOUTH DAILY 90 tablet 1   tiZANidine  (ZANAFLEX ) 2 MG tablet Take 1 tablet (2 mg total) by mouth every 8 (eight) hours as needed for muscle spasms. 270 tablet 0   traMADol  (ULTRAM ) 50 MG tablet Take 1 tablet (50 mg total) by mouth every 6 (six) hours as needed. 270 tablet 0   triamterene -hydrochlorothiazide  (DYAZIDE) 37.5-25 MG capsule Take 1 each (1 capsule total) by  mouth daily. 90 capsule 0   Current Facility-Administered Medications  Medication Dose Route Frequency Provider Last Rate Last Admin   0.9 %  sodium chloride  infusion  500 mL Intravenous Once Pyrtle, Amber Bail, MD         REVIEW OF SYSTEMS: Notable for that above.      PHYSICAL EXAM:  Wt Readings from Last 3 Encounters:  09/11/23 195 lb 12.8 oz (88.8 kg)  09/11/23 195 lb (88.5 kg)  08/16/23 195 lb 8.8 oz (88.7 kg)   Temp Readings from Last 3 Encounters:  09/11/23 97.8 F (36.6 C)  08/16/23 97.9 F (36.6 C) (Temporal)  08/15/23 (!) 97 F (36.1 C) (Temporal)   BP Readings from Last 3 Encounters:  09/11/23 (!) 160/85  09/11/23 104/62  09/06/23 136/76   Pulse Readings from Last 3 Encounters:  09/11/23 74  09/11/23 78  09/06/23 63   Pain Assessment Pain Score: 4  Pain Loc: Breast/10  In general this is a well appearing female in no acute distress. She's alert and oriented x4 and appropriate throughout the examination. Cardiopulmonary assessment is negative for acute distress and she exhibits normal effort.   Left  mammoplasty incision skin edges are poorly approximated in the right inferior T-zone with wet granulation tissue. No signs of infection. No palpable masses.     ECOG = 0  0 - Asymptomatic (Fully active, able to carry on all predisease activities without restriction)  1 - Symptomatic but completely ambulatory (Restricted in physically strenuous activity but ambulatory and able to carry out work of a light or sedentary nature. For example, light housework, office work)  2 - Symptomatic, <50% in bed during the day (Ambulatory and capable of all self care but unable to carry out any work activities. Up and about more than 50% of waking hours)  3 - Symptomatic, >50% in bed, but not bedbound (Capable of only limited self-care, confined to bed or chair 50% or more of waking hours)  4 - Bedbound (Completely disabled. Cannot carry on any self-care. Totally confined to bed or chair)  5 - Death   Aurea Blossom MM, Creech RH, Tormey DC, et al. [phone]). "Toxicity and response criteria of the [facility]". Am. Hillard Lowes. Oncol. 5 (6): 649-55    LABORATORY DATA:  Lab Results  Component Value Date   WBC 7.3 08/15/2023   HGB 13.3 08/15/2023   HCT 41.2 08/15/2023   MCV 98.3 08/15/2023   PLT 317 08/15/2023   Lab Results  Component Value Date   NA 139 08/13/2023   K 4.2 08/13/2023   CL 106 08/13/2023   CO2 25 08/13/2023   Lab Results  Component Value Date   ALT 7 04/18/2023   AST 11 (L) 04/18/2023   ALKPHOS 100 04/18/2023   BILITOT 0.3 04/18/2023      RADIOGRAPHY: No results found.      IMPRESSION/PLAN: 1. Stage IB (pT2, pN0, M0) high grade invasive ductal carcinoma, ER/PR+, HER2-  As per PE, patient has not completely healed from her mammoplasty. We will delay her CT simulation to allow for adequate healing. Dr. Jeryl Moris recommends adjuvant radiation to reduce the risk of locoregional disease recurrence. Today we discussed the natural course of early stage breast disease.  We highlighted the role of radiotherapy in the management and focused on the details of logistics and delivery. We reviewed the anticipated acute and late sequelae associated with radiation in this setting. The patient was encouraged to ask questions that I answered  to the best of my ability. Patient expressed readiness to proceed with treatment. A consent form was reviewed and signed today.    Plan:  Patient will be scheduled for CT simulation in approximately 1.5-2 weeks. Will plan to begin radiation approximately one week after simulation. Anticipate 4 weeks of radiation treatment.    In a visit lasting 60 minutes, greater than 50% of the time was spent face to face discussing the patient's condition, in preparation for the discussion, and coordinating the patient's care.   The above documentation reflects my direct findings during this shared patient visit. Please see the separate note by Dr. Jeryl Moris on this date for the remainder of the patient's plan of care.    Julio Ohm, PA-C   **Disclaimer: This note was dictated with voice recognition software. Similar sounding words can inadvertently be transcribed and this note may contain transcription errors which may not have been corrected upon publication of note.**

## 2023-09-11 NOTE — Progress Notes (Signed)
 68 y.o. 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 Legally Separated.  She was referred by her oncologist, Dr. Arno Bibles. She reports a history of prior hysterectomy due to abnormal Pap smears.  She denies any abnormalities following her hysterectomy. She denies any current pelvic pain, genital urinary itching or irritation or postmenopausal bleeding  05/06/2023 PET scan:  Within the anterior pelvic floor, eccentric right of the midline there is a medium size focus of intense radiotracer uptake with SUV max of 17.91, image 165/5. This appears to localize to the right anterolateral wall of the vagina.  Consider further evaluation with direct visualization. If further imaging is clinically indicated consider contrast enhanced pelvic MRI.  Last mammogram: 05/24/2023 BI-RADS 4, density B > left biopsy with invasive mammary carcinoma Sexually active: no   GYN HISTORY: History of cervical dysplasia Prior hysterectomy  OB History  Gravida Para Term Preterm AB Living  2 2 2   2   SAB IAB Ectopic Multiple Live Births      2    # Outcome Date GA Lbr Len/2nd Weight Sex Type Anes PTL Lv  2 Term      Vag-Spont   LIV  1 Term      Vag-Spont   LIV    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   Osteoarthritis     Past Surgical History:  Procedure Laterality Date   ABDOMINAL HYSTERECTOMY  1987   BREAST BIOPSY Left 05/28/2023   US  LT BREAST BX W LOC DEV EA ADD LESION IMG BX SPEC US  GUIDE 05/28/2023 [facility]   BREAST BIOPSY  Left 05/28/2023   US  LT BREAST BX W LOC DEV 1ST LESION IMG BX SPEC US  GUIDE 05/28/2023 [facility]   BREAST BIOPSY Left 07/02/2023   US  LT RADIOACTIVE SEED EA ADD LESION 07/02/2023 [facility]   BREAST BIOPSY Left 07/02/2023   US  LT RADIOACTIVE SEED LOC 07/02/2023 [facility]   BREAST LUMPECTOMY WITH RADIOACTIVE SEED AND SENTINEL LYMPH NODE BIOPSY Left 07/03/2023   Procedure: RADIOACTIVE SEED GUIDED LEFT BREAST LUMPECTOMY x2 AND LEFT SENTINEL NODE BIOPSY;  Surgeon: Oza Blumenthal, MD;  Location: [facility];  Service: General;  Laterality: Left;  LMA   BREAST REDUCTION SURGERY Bilateral 08/16/2023   Procedure: MAMMOPLASTY, REDUCTION;  Surgeon: Teretha Ferguson, MD;  Location: [facility];  Service: Plastics;  Laterality: Bilateral;   LAPAROTOMY N/A 02/04/2023   Procedure: EXPLORATORY LAPAROTOMY, WITH SMALL BOWEL RESECTION;  Surgeon: Oza Blumenthal, MD;  Location: [facility];  Service: General;  Laterality: N/A;    Current Outpatient Medications on File Prior to Visit  Medication Sig Dispense Refill   acetaminophen  (TYLENOL ) 500 MG tablet Take 2 tablets (1,000 mg total) by mouth 4 (four) times daily. 30 tablet 0   aspirin  EC 81 MG tablet Take 81 mg by mouth daily. Swallow whole.     carvedilol  (COREG ) 6.25 MG tablet Take 1 tablet (6.25 mg total) by mouth 2 (two) times daily with a meal. 180 tablet 0  cetirizine  (ZYRTEC ) 10 MG tablet Take 10 mg by mouth daily.     Cholecalciferol  (VITAMIN D ) 50 MCG (2000 UT) CAPS Take 1 capsule by mouth daily.     ezetimibe  (ZETIA ) 10 MG tablet TAKE 1 TABLET(10 MG) BY MOUTH DAILY 90 tablet 0   famotidine  (PEPCID ) 40 MG tablet TAKE 1 TABLET(40 MG) BY MOUTH AT BEDTIME 90 tablet 1   Ferric Maltol  (ACCRUFER ) 30 MG CAPS Take 1 capsule (30 mg total) by mouth in the morning and at bedtime. 180 capsule 0   fluticasone  (FLONASE ) 50 MCG/ACT nasal spray Place 2 sprays into both nostrils daily. 48 g 1   hydrALAZINE  (APRESOLINE ) 25 MG tablet TAKE 1  TABLET(25 MG) BY MOUTH TWO TIMES DAILY     montelukast  (SINGULAIR ) 10 MG tablet Take 1 tablet (10 mg total) by mouth at bedtime. 90 tablet 0   pantoprazole  (PROTONIX ) 20 MG tablet TAKE 1 TABLET(20 MG) BY MOUTH DAILY 90 tablet 1   pravastatin  (PRAVACHOL ) 40 MG tablet TAKE 1 TABLET(40 MG) BY MOUTH DAILY 90 tablet 1   tiZANidine  (ZANAFLEX ) 2 MG tablet Take 1 tablet (2 mg total) by mouth every 8 (eight) hours as needed for muscle spasms. 270 tablet 0   traMADol  (ULTRAM ) 50 MG tablet Take 1 tablet (50 mg total) by mouth every 6 (six) hours as needed. 270 tablet 0   triamterene -hydrochlorothiazide  (DYAZIDE) 37.5-25 MG capsule Take 1 each (1 capsule total) by mouth daily. 90 capsule 0   Current Facility-Administered Medications on File Prior to Visit  Medication Dose Route Frequency Provider Last Rate Last Admin   0.9 %  sodium chloride  infusion  500 mL Intravenous Once Pyrtle, Amber Bail, MD        Allergies  Allergen Reactions   Cozaar  [Losartan  Potassium] Other (See Comments)    Caused cough and sweats. Tingling in legs.   Lisinopril  Cough   Statins Other (See Comments)    myalgias   PE Today's Vitals   09/11/23 0833  BP: 104/62  Pulse: 78  SpO2: 98%  Weight: 195 lb (88.5 kg)  Height: 5\' 3"  (1.6 m)   Body mass index is 34.54 kg/m.  Physical Exam Vitals reviewed. Exam conducted with a chaperone present.  Constitutional:      General: She is not in acute distress.    Appearance: Normal appearance.  HENT:     Head: Normocephalic and atraumatic.     Nose: Nose normal.  Eyes:     Extraocular Movements: Extraocular movements intact.     Conjunctiva/sclera: Conjunctivae normal.  Pulmonary:     Effort: Pulmonary effort is normal.  Genitourinary:    General: Normal vulva.     Exam position: Lithotomy position.     Vagina: Lesions present. No vaginal discharge.     Uterus: Absent.      Adnexa: Right adnexa normal and left adnexa normal.     Comments: Midline vaginal polyp along  vaginal cuff ~1cm, otherwise normal bimanual exam.  No cuff nodularity or tenderness present. Musculoskeletal:        General: Normal range of motion.     Cervical back: Normal range of motion.  Neurological:     General: No focal deficit present.     Mental Status: She is alert.  Psychiatric:        Mood and Affect: Mood normal.        Behavior: Behavior normal.     Assessment and Plan:        Vaginal polyp Assessment &  Plan: She was referred by her oncologist, Dr. Arno Bibles for abnormal PET scan findings. She reports a history of prior hysterectomy due to abnormal Pap smears.   Currently asymptomatic 05/06/2023 PET scan: medium size focus of intense radiotracer uptake with SUV max of 17.91, image 165/5. This appears to localize to the right anterolateral wall of the vagina.  Exam reveals a midline vaginal polyp along vaginal cuff. Pap collected today given patient's history of cervical dysplasia. Will perform vaginal biopsy at next appointment. All questions answered.  Orders: -     Cytology - PAP  History of abnormal cervical Pap smear -     Cytology - PAP  Cervical cancer screening -     Cytology - PAP    Cadee Agro Hadassah Letters, MD

## 2023-09-12 ENCOUNTER — Other Ambulatory Visit: Payer: Self-pay | Admitting: Internal Medicine

## 2023-09-12 DIAGNOSIS — M545 Low back pain, unspecified: Secondary | ICD-10-CM

## 2023-09-12 DIAGNOSIS — R252 Cramp and spasm: Secondary | ICD-10-CM

## 2023-09-12 DIAGNOSIS — I1A0 Resistant hypertension: Secondary | ICD-10-CM

## 2023-09-12 DIAGNOSIS — J301 Allergic rhinitis due to pollen: Secondary | ICD-10-CM

## 2023-09-12 DIAGNOSIS — I119 Hypertensive heart disease without heart failure: Secondary | ICD-10-CM

## 2023-09-13 ENCOUNTER — Encounter: Payer: Self-pay | Admitting: Student

## 2023-09-13 ENCOUNTER — Ambulatory Visit (INDEPENDENT_AMBULATORY_CARE_PROVIDER_SITE_OTHER): Admitting: Student

## 2023-09-13 VITALS — BP 143/78 | HR 71

## 2023-09-13 DIAGNOSIS — Z9889 Other specified postprocedural states: Secondary | ICD-10-CM

## 2023-09-13 NOTE — Progress Notes (Signed)
 Patient is a 68 year old female with history of left breast cancer and macromastia.  She underwent oncoplastic breast reduction with Dr. Carolynne Citron on 08/16/2023.  Patient is 4 weeks postop.  She presents the clinic today for postoperative follow-up.         Patient was last seen in the clinic on 09/06/2023.  At this visit, patient reported she was doing well.  On exam, breasts are soft and symmetric.  NAC's are healthy.  There is a small superficial wound noted to the right inferior T-zone into the left inferior T-zone there is an approximately 4 cm x 2 cm superficial wound with exudate throughout.  Recommended that patient apply Vashe soaked gauze to the wound to the left and apply Xeroform and a Mepilex border dressing to the left breast wound.  Recommended that she apply Vaseline daily to the right breast wound.  Oncology was contacted last week and it was recommended that patient wait at least another week to start chemotherapy.  Oncology recommended that patient transition to radiation now.  Today, patient reports she is doing okay.  She states that she feels the wound to the left breast has opened up more.  She states that there has been some drainage on the dressings, but denies copious amounts of drainage.  She denies any fevers or chills.  She states that she saw a radiation oncology earlier this week, and they did not want to proceed with radiation given wound.  Chaperone present on exam.  On exam, patient is sitting upright in no acute distress.  Breasts are soft and symmetric.  There is no overlying erythema.  No obvious fluid collections on exam.  NAC's are healthy bilaterally.  Wound to the left breast is approximately 5 cm x 3 cm.  There is healthy appearing tissue throughout the wound.  There is a small superficial wound to the inferior T-zone of the right breast with exudate noted in the wound.  There are no signs of infection on exam.  Recommended that patient continue with wound care daily to  her wounds as prescribed.  Recommended that she also make sure that she is getting plenty of proteins and to assist with the wound healing.  Patient expressed understanding.  She states that she is drinking ensures and eating plenty of eggs and chicken.  Patient states that she is supposed to follow back up with radiation oncology in about a week or a week and a half.  I recommended that patient follow-up with Dr. Carolynne Citron next week for reevaluation.  I instructed the patient in the meantime though to call if she has any questions or concerns.  Pictures were obtained of the patient and placed in the chart with the patient's or guardian's permission.

## 2023-09-16 ENCOUNTER — Encounter: Payer: Self-pay | Admitting: Obstetrics and Gynecology

## 2023-09-16 ENCOUNTER — Encounter: Payer: Self-pay | Admitting: Internal Medicine

## 2023-09-16 ENCOUNTER — Ambulatory Visit (INDEPENDENT_AMBULATORY_CARE_PROVIDER_SITE_OTHER): Payer: Medicare Other | Admitting: Internal Medicine

## 2023-09-16 VITALS — BP 132/66 | HR 87 | Temp 98.4°F | Resp 16 | Ht 63.0 in | Wt 195.8 lb

## 2023-09-16 DIAGNOSIS — I1A0 Resistant hypertension: Secondary | ICD-10-CM | POA: Diagnosis not present

## 2023-09-16 DIAGNOSIS — D500 Iron deficiency anemia secondary to blood loss (chronic): Secondary | ICD-10-CM

## 2023-09-16 LAB — CYTOLOGY - PAP: Diagnosis: NEGATIVE

## 2023-09-16 MED ORDER — ACCRUFER 30 MG PO CAPS: 1.0000 | ORAL_CAPSULE | Freq: Two times a day (BID) | ORAL | 0 refills | Status: DC

## 2023-09-16 NOTE — Progress Notes (Addendum)
 Subjective:  Patient ID: Kayla Lowe , female    DOB: 09/02/1955  Age: 68 y.o. MRN: [id number]  CC: Hypertension   HPI Kayla Lowe  presents for f/up -----  Discussed the use of AI scribe software for clinical note transcription with the patient, who gave verbal consent to proceed.  History of Present Illness   Kayla Lowe  is a 68 year old female who presents for follow-up after breast surgery and to discuss delayed chemotherapy and radiation treatment.  She underwent breast surgery with two pounds removed from each breast, requiring reconstruction of the left breast. Postoperatively, she is dissatisfied with the appearance, describing them as 'crying' due to the small areola and incision appearance.  Her chemotherapy was initially scheduled to start on April 25th but was delayed due to insufficient wound closure. The left breast wound has not closed adequately, and the use of Vaseline on the T zone has not improved the situation, delaying radiation therapy by another week and a half.  She experiences aggravation, pain, soreness, and tenderness in the surgical area. For pain management, she takes Tylenol  four times a day and Tramadol , which helps manage the discomfort, although she feels she cannot rest well.  No postoperative signs of infection such as fever or chills. No swelling in her legs or feet. No abdominal pain, nausea, vomiting, chest pain, or shortness of breath.       Outpatient Medications Prior to Visit  Medication Sig Dispense Refill   acetaminophen  (TYLENOL ) 500 MG tablet Take 2 tablets (1,000 mg total) by mouth 4 (four) times daily. 30 tablet 0   aspirin  EC 81 MG tablet Take 81 mg by mouth daily. Swallow whole.     Cholecalciferol  (VITAMIN D ) 50 MCG (2000 UT) CAPS Take 1 capsule by mouth daily.     ezetimibe  (ZETIA ) 10 MG tablet TAKE 1 TABLET(10 MG) BY MOUTH DAILY 90 tablet 0   hydrALAZINE  (APRESOLINE ) 25 MG tablet TAKE 1  TABLET(25 MG) BY MOUTH TWO TIMES DAILY     Loratadine  (CLARITIN  PO) Take by mouth daily.     montelukast  (SINGULAIR ) 10 MG tablet Take 1 tablet (10 mg total) by mouth at bedtime. 90 tablet 0   pravastatin  (PRAVACHOL ) 40 MG tablet TAKE 1 TABLET(40 MG) BY MOUTH DAILY 90 tablet 1   traMADol  (ULTRAM ) 50 MG tablet Take 1 tablet (50 mg total) by mouth every 6 (six) hours as needed. 270 tablet 0   carvedilol  (COREG ) 6.25 MG tablet Take 1 tablet (6.25 mg total) by mouth 2 (two) times daily with a meal. 180 tablet 0   famotidine  (PEPCID ) 40 MG tablet TAKE 1 TABLET(40 MG) BY MOUTH AT BEDTIME 90 tablet 1   Ferric Maltol  (ACCRUFER ) 30 MG CAPS Take 1 capsule (30 mg total) by mouth in the morning and at bedtime. 180 capsule 0   fluticasone  (FLONASE ) 50 MCG/ACT nasal spray Place 2 sprays into both nostrils daily. 48 g 1   pantoprazole  (PROTONIX ) 20 MG tablet TAKE 1 TABLET(20 MG) BY MOUTH DAILY 90 tablet 1   tiZANidine  (ZANAFLEX ) 2 MG tablet Take 1 tablet (2 mg total) by mouth every 8 (eight) hours as needed for muscle spasms. 270 tablet 0   triamterene -hydrochlorothiazide  (DYAZIDE) 37.5-25 MG capsule Take 1 each (1 capsule total) by mouth daily. 90 capsule 0   cetirizine  (ZYRTEC ) 10 MG tablet Take 10 mg by mouth daily.     0.9 %  sodium chloride  infusion      No facility-administered medications prior to visit.  ROS Review of Systems  Constitutional:  Negative for appetite change, chills, diaphoresis and fatigue.  HENT: Negative.    Eyes: Negative.   Respiratory:  Negative for cough, choking, shortness of breath and wheezing.   Cardiovascular:  Negative for chest pain, palpitations and leg swelling.  Gastrointestinal: Negative.  Negative for abdominal pain, constipation, diarrhea, nausea and vomiting.  Endocrine: Negative.   Genitourinary: Negative.  Negative for difficulty urinating.  Musculoskeletal:  Positive for arthralgias. Negative for myalgias.  Skin: Negative.   Neurological: Negative.    Hematological:  Negative for adenopathy. Does not bruise/bleed easily.  Psychiatric/Behavioral: Negative.      Objective:  BP 132/66 (BP Location: Right Arm, Patient Position: Sitting, Cuff Size: Normal)   Pulse 87   Temp 98.4 F (36.9 C) (Oral)   Resp 16   Ht 5\' 3"  (1.6 m)   Wt 195 lb 12.8 oz (88.8 kg)   SpO2 98%   BMI 34.68 kg/m   BP Readings from Last 3 Encounters:  09/25/23 122/64  09/19/23 (!) 147/76  09/16/23 132/66    Wt Readings from Last 3 Encounters:  09/25/23 197 lb 12.8 oz (89.7 kg)  09/16/23 195 lb 12.8 oz (88.8 kg)  09/11/23 195 lb 12.8 oz (88.8 kg)    Physical Exam Vitals reviewed.  Constitutional:      Appearance: Normal appearance.  HENT:     Nose: Nose normal.     Mouth/Throat:     Mouth: Mucous membranes are moist.  Eyes:     General: No scleral icterus.    Conjunctiva/sclera: Conjunctivae normal.  Cardiovascular:     Rate and Rhythm: Normal rate and regular rhythm.     Heart sounds: No murmur heard.    No friction rub. No gallop.  Pulmonary:     Effort: Pulmonary effort is normal.     Breath sounds: No stridor. No wheezing, rhonchi or rales.  Abdominal:     General: Abdomen is flat.     Palpations: There is no mass.     Tenderness: There is no abdominal tenderness. There is no guarding.     Hernia: No hernia is present.  Musculoskeletal:        General: Normal range of motion.     Cervical back: Neck supple.     Right lower leg: No edema.     Left lower leg: No edema.  Lymphadenopathy:     Cervical: No cervical adenopathy.  Skin:    General: Skin is warm and dry.  Neurological:     General: No focal deficit present.     Mental Status: She is alert. Mental status is at baseline.  Psychiatric:        Mood and Affect: Mood normal.        Behavior: Behavior normal.     Lab Results  Component Value Date   WBC 7.3 08/15/2023   HGB 13.3 08/15/2023   HCT 41.2 08/15/2023   PLT 317 08/15/2023   GLUCOSE 86 08/13/2023   CHOL 177  03/13/2023   TRIG 167.0 (H) 03/13/2023   HDL 49.20 03/13/2023   LDLDIRECT 220.0 09/16/2019   LDLCALC 94 03/13/2023   ALT 7 04/18/2023   AST 11 (L) 04/18/2023   NA 139 08/13/2023   K 4.2 08/13/2023   CL 106 08/13/2023   CREATININE 1.09 (H) 08/13/2023   BUN 11 08/13/2023   CO2 25 08/13/2023   TSH 1.28 03/13/2023   INR 0.97 07/10/2013   HGBA1C 5.4 08/31/2019  No results found.  Assessment & Plan:   Resistant hypertension- Her BP is adequately well controlled.  Iron deficiency anemia due to chronic blood loss -     ACCRUFeR ; Take 1 capsule (30 mg total) by mouth in the morning and at bedtime.  Dispense: 180 capsule; Refill: 0     Follow-up: Return in about 6 months (around 03/18/2024).  Sandra Crouch, MD

## 2023-09-16 NOTE — Patient Instructions (Signed)
 Hypertension, Adult High blood pressure (hypertension) is when the force of blood pumping through the arteries is too strong. The arteries are the blood vessels that carry blood from the heart throughout the body. Hypertension forces the heart to work harder to pump blood and may cause arteries to become narrow or stiff. Untreated or uncontrolled hypertension can lead to a heart attack, heart failure, a stroke, kidney disease, and other problems. A blood pressure reading consists of a higher number over a lower number. Ideally, your blood pressure should be below 120/80. The first ("top") number is called the systolic pressure. It is a measure of the pressure in your arteries as your heart beats. The second ("bottom") number is called the diastolic pressure. It is a measure of the pressure in your arteries as the heart relaxes. What are the causes? The exact cause of this condition is not known. There are some conditions that result in high blood pressure. What increases the risk? Certain factors may make you more likely to develop high blood pressure. Some of these risk factors are under your control, including: Smoking. Not getting enough exercise or physical activity. Being overweight. Having too much fat, sugar, calories, or salt (sodium) in your diet. Drinking too much alcohol. Other risk factors include: Having a personal history of heart disease, diabetes, high cholesterol, or kidney disease. Stress. Having a family history of high blood pressure and high cholesterol. Having obstructive sleep apnea. Age. The risk increases with age. What are the signs or symptoms? High blood pressure may not cause symptoms. Very high blood pressure (hypertensive crisis) may cause: Headache. Fast or irregular heartbeats (palpitations). Shortness of breath. Nosebleed. Nausea and vomiting. Vision changes. Severe chest pain, dizziness, and seizures. How is this diagnosed? This condition is diagnosed by  measuring your blood pressure while you are seated, with your arm resting on a flat surface, your legs uncrossed, and your feet flat on the floor. The cuff of the blood pressure monitor will be placed directly against the skin of your upper arm at the level of your heart. Blood pressure should be measured at least twice using the same arm. Certain conditions can cause a difference in blood pressure between your right and left arms. If you have a high blood pressure reading during one visit or you have normal blood pressure with other risk factors, you may be asked to: Return on a different day to have your blood pressure checked again. Monitor your blood pressure at home for 1 week or longer. If you are diagnosed with hypertension, you may have other blood or imaging tests to help your health care provider understand your overall risk for other conditions. How is this treated? This condition is treated by making healthy lifestyle changes, such as eating healthy foods, exercising more, and reducing your alcohol intake. You may be referred for counseling on a healthy diet and physical activity. Your health care provider may prescribe medicine if lifestyle changes are not enough to get your blood pressure under control and if: Your systolic blood pressure is above 130. Your diastolic blood pressure is above 80. Your personal target blood pressure may vary depending on your medical conditions, your age, and other factors. Follow these instructions at home: Eating and drinking  Eat a diet that is high in fiber and potassium, and low in sodium, added sugar, and fat. An example of this eating plan is called the DASH diet. DASH stands for Dietary Approaches to Stop Hypertension. To eat this way: Eat  plenty of fresh fruits and vegetables. Try to fill one half of your plate at each meal with fruits and vegetables. Eat whole grains, such as whole-wheat pasta, brown rice, or whole-grain bread. Fill about one  fourth of your plate with whole grains. Eat or drink low-fat dairy products, such as skim milk or low-fat yogurt. Avoid fatty cuts of meat, processed or cured meats, and poultry with skin. Fill about one fourth of your plate with lean proteins, such as fish, chicken without skin, beans, eggs, or tofu. Avoid pre-made and processed foods. These tend to be higher in sodium, added sugar, and fat. Reduce your daily sodium intake. Many people with hypertension should eat less than 1,500 mg of sodium a day. Do not drink alcohol if: Your health care provider tells you not to drink. You are pregnant, may be pregnant, or are planning to become pregnant. If you drink alcohol: Limit how much you have to: 0-1 drink a day for women. 0-2 drinks a day for men. Know how much alcohol is in your drink. In the [place] one drink equals one 12 oz bottle of beer (355 mL), one 5 oz glass of wine (148 mL), or one 1 oz glass of hard liquor (44 mL). Lifestyle  Work with your health care provider to maintain a healthy body weight or to lose weight. Ask what an ideal weight is for you. Get at least 30 minutes of exercise that causes your heart to beat faster (aerobic exercise) most days of the week. Activities may include walking, swimming, or biking. Include exercise to strengthen your muscles (resistance exercise), such as Pilates or lifting weights, as part of your weekly exercise routine. Try to do these types of exercises for 30 minutes at least 3 days a week. Do not use any products that contain nicotine or tobacco. These products include cigarettes, chewing tobacco, and vaping devices, such as e-cigarettes. If you need help quitting, ask your health care provider. Monitor your blood pressure at home as told by your health care provider. Keep all follow-up visits. This is important. Medicines Take over-the-counter and prescription medicines only as told by your health care provider. Follow directions carefully. Blood  pressure medicines must be taken as prescribed. Do not skip doses of blood pressure medicine. Doing this puts you at risk for problems and can make the medicine less effective. Ask your health care provider about side effects or reactions to medicines that you should watch for. Contact a health care provider if you: Think you are having a reaction to a medicine you are taking. Have headaches that keep coming back (recurring). Feel dizzy. Have swelling in your ankles. Have trouble with your vision. Get help right away if you: Develop a severe headache or confusion. Have unusual weakness or numbness. Feel faint. Have severe pain in your chest or abdomen. Vomit repeatedly. Have trouble breathing. These symptoms may be an emergency. Get help right away. Call 911. Do not wait to see if the symptoms will go away. Do not drive yourself to the hospital. Summary Hypertension is when the force of blood pumping through your arteries is too strong. If this condition is not controlled, it may put you at risk for serious complications. Your personal target blood pressure may vary depending on your medical conditions, your age, and other factors. For most people, a normal blood pressure is less than 120/80. Hypertension is treated with lifestyle changes, medicines, or a combination of both. Lifestyle changes include losing weight, eating a healthy,  low-sodium diet, exercising more, and limiting alcohol. This information is not intended to replace advice given to you by your health care provider. Make sure you discuss any questions you have with your health care provider. Document Revised: 03/07/2021 Document Reviewed: 03/07/2021 Elsevier Patient Education  2024 [employer].

## 2023-09-18 ENCOUNTER — Other Ambulatory Visit: Payer: Self-pay | Admitting: Internal Medicine

## 2023-09-18 DIAGNOSIS — I119 Hypertensive heart disease without heart failure: Secondary | ICD-10-CM

## 2023-09-18 DIAGNOSIS — I1A0 Resistant hypertension: Secondary | ICD-10-CM

## 2023-09-18 DIAGNOSIS — J301 Allergic rhinitis due to pollen: Secondary | ICD-10-CM

## 2023-09-18 NOTE — Telephone Encounter (Signed)
 Copied from CRM [id number]. Topic: Clinical - Medication Refill >> Sep 18, 2023  1:52 PM Howard Macho wrote: Medication: montelukast  (SINGULAIR ) 10 MG tablet and triamterene -hydrochlorothiazide  (waiting on approval for authorization from doctor)  Has the patient contacted their pharmacy? Yes (Agent: If no, request that the patient contact the pharmacy for the refill. If patient does not wish to contact the pharmacy document the reason why and proceed with request.) (Agent: If yes, when and what did the pharmacy advise?)  This is the patient's preferred pharmacy:  [id number] Jonette Nestle, [place] - [address] AT [facility] OF [facility] [address] [place] [address] Phone: [phone] Fax: [phone]  Is this the correct pharmacy for this prescription? Yes If no, delete pharmacy and type the correct one.   Has the prescription been filled recently? No  Is the patient out of the medication? Yes  Has the patient been seen for an appointment in the last year OR does the patient have an upcoming appointment? Yes  Can we respond through MyChart? Yes  Agent: Please be advised that Rx refills may take up to 3 business days. We ask that you follow-up with your pharmacy.

## 2023-09-18 NOTE — Telephone Encounter (Signed)
 Last Fill: Dyazide: 06/19/23       Last OV: 08/27/23 Next OV: 03/18/24  Routing to provider for review/authorization.

## 2023-09-19 ENCOUNTER — Ambulatory Visit (INDEPENDENT_AMBULATORY_CARE_PROVIDER_SITE_OTHER): Admitting: Plastic Surgery

## 2023-09-19 VITALS — BP 147/76 | HR 84

## 2023-09-19 DIAGNOSIS — Z9889 Other specified postprocedural states: Secondary | ICD-10-CM

## 2023-09-19 DIAGNOSIS — C50212 Malignant neoplasm of upper-inner quadrant of left female breast: Secondary | ICD-10-CM

## 2023-09-19 DIAGNOSIS — Z170 Estrogen receptor positive status [ER+]: Secondary | ICD-10-CM

## 2023-09-19 NOTE — Progress Notes (Signed)
 [name]  returns today for evaluation after breast reduction.  She underwent breast reduction on 4 April.  Is been complicated by some minor wound breakdown.  The largest is at the T point on the left breast.  This area is clean with no evidence of infection and there is clear healing.  She also has 2 small areas 1 at the superior portion of the nipple and areolar complex on the left and 1 at the T point on the right.  Will continue close follow-up and dressing changes.  She will have to delay chemotherapy and radiation therapy until these are more well-healed.  Follow-up in 1 week.

## 2023-09-20 ENCOUNTER — Telehealth: Payer: Self-pay | Admitting: Internal Medicine

## 2023-09-20 ENCOUNTER — Telehealth: Payer: Self-pay

## 2023-09-20 ENCOUNTER — Other Ambulatory Visit: Payer: Self-pay

## 2023-09-20 ENCOUNTER — Other Ambulatory Visit (HOSPITAL_COMMUNITY): Payer: Self-pay

## 2023-09-20 DIAGNOSIS — I119 Hypertensive heart disease without heart failure: Secondary | ICD-10-CM

## 2023-09-20 DIAGNOSIS — I1A0 Resistant hypertension: Secondary | ICD-10-CM

## 2023-09-20 MED ORDER — TRIAMTERENE-HCTZ 37.5-25 MG PO CAPS
1.0000 | ORAL_CAPSULE | Freq: Every day | ORAL | 0 refills | Status: DC
Start: 2023-09-20 — End: 2023-12-17

## 2023-09-20 NOTE — Telephone Encounter (Signed)
 Copied from CRM [id number]. Topic: Clinical - Prescription Issue >> Sep 20, 2023 12:36 PM Kayla Lowe wrote: Reason for CRM: pt called stating she is having a problem getting triamterene -hydrochlorothiazide  (DYAZIDE) 37.5-25 MG capsule. Pt states the pharmacy states the medication needs a prior authorization . Please call pt back at [phone]

## 2023-09-20 NOTE — Telephone Encounter (Signed)
 Pharmacy Patient Advocate Encounter   Received notification from Pt Calls Messages that prior authorization for Triamterene -hydrochlorothiazide  37.5-25mg  caps is required/requested.   Insurance verification completed.   The patient is insured through [employer] .   Per test claim: The current 90 day co-pay is, $0.  No PA needed at this time. This test claim was processed through [facility]- copay amounts may vary at other pharmacies due to pharmacy/plan contracts, or as the patient moves through the different stages of their insurance plan.     Placed a call to [facility] to see if a PA is required, per the pharmacist, the medication is out of refills and need a new order to be sent in for the patient.

## 2023-09-20 NOTE — Telephone Encounter (Signed)
Noted. Patient has been made aware.  °

## 2023-09-20 NOTE — Telephone Encounter (Signed)
 Medication has been refilled.

## 2023-09-23 ENCOUNTER — Telehealth: Payer: Self-pay | Admitting: Plastic Surgery

## 2023-09-23 ENCOUNTER — Other Ambulatory Visit

## 2023-09-23 ENCOUNTER — Ambulatory Visit

## 2023-09-23 ENCOUNTER — Encounter: Payer: Self-pay | Admitting: *Deleted

## 2023-09-23 ENCOUNTER — Ambulatory Visit: Admitting: Adult Health

## 2023-09-23 NOTE — Telephone Encounter (Signed)
 Patient would like to know if she could another order of rensura sterile silicone foam dressing, if not where can she get them, please advise

## 2023-09-23 NOTE — Telephone Encounter (Signed)
erreo

## 2023-09-24 ENCOUNTER — Telehealth: Payer: Self-pay

## 2023-09-24 NOTE — Telephone Encounter (Signed)
 Called the patient to see what dressing she was referring to. Spoke with PA Anastacio Balm and we are going to order supplies from [facility] . Fax was sent to [facility]  requesting Mepilex foam dressings. Confirmation of receipt.

## 2023-09-25 ENCOUNTER — Ambulatory Visit

## 2023-09-25 ENCOUNTER — Encounter: Payer: Self-pay | Admitting: Obstetrics and Gynecology

## 2023-09-25 ENCOUNTER — Other Ambulatory Visit (HOSPITAL_COMMUNITY)
Admission: RE | Admit: 2023-09-25 | Discharge: 2023-09-25 | Disposition: A | Discharge status: Home / Self Care | Source: Ambulatory Visit | Attending: Obstetrics and Gynecology | Admitting: Obstetrics and Gynecology

## 2023-09-25 ENCOUNTER — Ambulatory Visit: Admitting: Obstetrics and Gynecology

## 2023-09-25 VITALS — BP 122/64 | HR 82 | Temp 97.8°F | Wt 197.8 lb

## 2023-09-25 DIAGNOSIS — N842 Polyp of vagina: Secondary | ICD-10-CM

## 2023-09-25 NOTE — Progress Notes (Signed)
 68 y.o. 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 Legally Separated.  She was referred by her oncologist, Dr. Arno Bibles. She reports a history of prior hysterectomy due to abnormal Pap smears.  She denies any abnormalities following her hysterectomy. She denied any current pelvic pain, genital urinary itching or irritation or postmenopausal bleeding  05/06/2023 PET scan:  Within the anterior pelvic floor, eccentric right of the midline there is a medium size focus of intense radiotracer uptake with SUV max of 17.91, image 165/5. This appears to localize to the right anterolateral wall of the vagina.  Consider further evaluation with direct visualization. If further imaging is clinically indicated consider contrast enhanced pelvic MRI.  Today, she reports she is still healing from bilateral breast reduction.  Continues to deny any vaginal bleeding or pain.     Component Value Date/Time   DIAGPAP  09/11/2023 0857    - Negative for intraepithelial lesion or malignancy (NILM)   ADEQPAP Satisfactory for evaluation. 09/11/2023 0857   Last mammogram: 05/24/2023 BI-RADS 4, density B > left biopsy with invasive mammary carcinoma Sexually active: no   GYN HISTORY: History of cervical dysplasia Prior hysterectomy  OB History  Gravida Para Term Preterm AB Living  2 2 2   2   SAB IAB Ectopic Multiple Live Births      2    # Outcome Date GA Lbr Len/2nd Weight Sex Type Anes PTL Lv  2 Term      Vag-Spont   LIV  1 Term      Vag-Spont   LIV    Past Medical History:  Diagnosis Date   Anemia    iron deficiency   Breast cancer, left (HCC) 06/2023   left breast IDC x2 sites   GERD (gastroesophageal reflux disease)    History of kidney stones    Hyperlipidemia    Hypertension     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   Osteoarthritis     Past Surgical History:  Procedure Laterality Date   ABDOMINAL HYSTERECTOMY  1987   BREAST BIOPSY Left 05/28/2023   US  LT BREAST BX W LOC DEV EA ADD LESION IMG BX SPEC US  GUIDE 05/28/2023 [facility]   BREAST BIOPSY Left 05/28/2023   US  LT BREAST BX W LOC DEV 1ST LESION IMG BX SPEC US  GUIDE 05/28/2023 [facility]   BREAST BIOPSY Left 07/02/2023   US  LT RADIOACTIVE SEED EA ADD LESION 07/02/2023 [facility]   BREAST BIOPSY Left 07/02/2023   US  LT RADIOACTIVE SEED LOC 07/02/2023 [facility]   BREAST LUMPECTOMY WITH RADIOACTIVE SEED AND SENTINEL LYMPH NODE BIOPSY Left 07/03/2023   Procedure: RADIOACTIVE SEED GUIDED LEFT BREAST LUMPECTOMY x2 AND LEFT SENTINEL NODE BIOPSY;  Surgeon: Oza Blumenthal, MD;  Location: [facility];  Service: General;  Laterality: Left;  LMA   BREAST REDUCTION SURGERY Bilateral 08/16/2023   Procedure: MAMMOPLASTY, REDUCTION;  Surgeon: Teretha Ferguson, MD;  Location: [facility];  Service: Plastics;  Laterality: Bilateral;   LAPAROTOMY N/A 02/04/2023   Procedure: EXPLORATORY LAPAROTOMY, WITH SMALL BOWEL RESECTION;  Surgeon: Oza Blumenthal, MD;  Location: [facility];  Service: General;  Laterality: N/A;    Current Outpatient Medications on File Prior to Visit  Medication Sig Dispense Refill   acetaminophen  (TYLENOL ) 500 MG tablet Take 2 tablets (1,000 mg  total) by mouth 4 (four) times daily. 30 tablet 0   aspirin  EC 81 MG tablet Take 81 mg by mouth daily. Swallow whole.     carvedilol  (COREG ) 6.25 MG tablet Take 1 tablet (6.25 mg total) by mouth 2 (two) times daily with a meal. 180 tablet 0   Cholecalciferol  (VITAMIN D ) 50 MCG (2000 UT) CAPS Take 1 capsule by mouth daily.     ezetimibe  (ZETIA ) 10 MG tablet TAKE 1 TABLET(10 MG) BY MOUTH DAILY 90 tablet 0   famotidine  (PEPCID ) 40 MG tablet TAKE 1 TABLET(40 MG) BY MOUTH AT BEDTIME 90 tablet 1   Ferric Maltol  (ACCRUFER )  30 MG CAPS Take 1 capsule (30 mg total) by mouth in the morning and at bedtime. 180 capsule 0   fluticasone  (FLONASE ) 50 MCG/ACT nasal spray SHAKE LIQUID AND USE 2 SPRAYS IN EACH NOSTRIL DAILY 48 g 1   hydrALAZINE  (APRESOLINE ) 25 MG tablet TAKE 1 TABLET(25 MG) BY MOUTH TWO TIMES DAILY     Loratadine  (CLARITIN  PO) Take by mouth daily.     montelukast  (SINGULAIR ) 10 MG tablet Take 1 tablet (10 mg total) by mouth at bedtime. 90 tablet 0   pantoprazole  (PROTONIX ) 20 MG tablet TAKE 1 TABLET(20 MG) BY MOUTH DAILY 90 tablet 1   pravastatin  (PRAVACHOL ) 40 MG tablet TAKE 1 TABLET(40 MG) BY MOUTH DAILY 90 tablet 1   tiZANidine  (ZANAFLEX ) 2 MG tablet TAKE 1 TABLET(2 MG) BY MOUTH EVERY 8 HOURS AS NEEDED FOR MUSCLE SPASMS 270 tablet 0   traMADol  (ULTRAM ) 50 MG tablet Take 1 tablet (50 mg total) by mouth every 6 (six) hours as needed. 270 tablet 0   triamterene -hydrochlorothiazide  (DYAZIDE) 37.5-25 MG capsule Take 1 each (1 capsule total) by mouth daily. 90 capsule 0   No current facility-administered medications on file prior to visit.    Allergies  Allergen Reactions   Cozaar  [Losartan  Potassium] Other (See Comments)    Caused cough and sweats. Tingling in legs.   Lisinopril  Cough   Statins Other (See Comments)    myalgias   PE Today's Vitals   09/25/23 0830  BP: 122/64  Pulse: 82  Temp: 97.8 F (36.6 C)  TempSrc: Oral  SpO2: 98%  Weight: 197 lb 12.8 oz (89.7 kg)   Body mass index is 35.04 kg/m.  Physical Exam Vitals reviewed. Exam conducted with a chaperone present.  Constitutional:      General: She is not in acute distress.    Appearance: Normal appearance.  HENT:     Head: Normocephalic and atraumatic.     Nose: Nose normal.  Eyes:     Extraocular Movements: Extraocular movements intact.     Conjunctiva/sclera: Conjunctivae normal.  Pulmonary:     Effort: Pulmonary effort is normal.  Genitourinary:    General: Normal vulva.     Exam position: Lithotomy position.      Vagina: Lesions present. No vaginal discharge.     Uterus: Absent.      Adnexa: Right adnexa normal and left adnexa normal.     Comments: Midline vaginal polyp along vaginal cuff ~1cm, otherwise normal bimanual exam.  No cuff nodularity or tenderness present. Musculoskeletal:        General: Normal range of motion.     Cervical back: Normal range of motion.  Neurological:     General: No focal deficit present.     Mental Status: She is alert.  Psychiatric:        Mood and Affect: Mood normal.  Behavior: Behavior normal.    Vaginal biopsy procedure Consented for procedure. Time out performed.  Speculum placed in vagina. The vagina was cleaned with Betadine.  Lesion was identified.  Topical HurriCaine spray was applied.  Patient did have some burning with application.  Additional solution was cleared from the vagina with the scopette.  A Tischler forceps was used to biopsy the vaginal polyp. Specimen was sent to pathology.  Silver nitrate was applied for hemostasis.  Minimal EBL. No complications.  Tolerated well.     Assessment and Plan:        Vaginal polyp -     Surgical pathology  Uncomplicated biopsy. Patient to take Tylenol  as needed for pain. Reviewed to expect light bleeding and cramping for 3 to 5 days following procedure. To call with any concerning symptoms.   Romaine Closs, MD

## 2023-09-26 ENCOUNTER — Ambulatory Visit: Payer: Self-pay | Admitting: Obstetrics and Gynecology

## 2023-09-26 ENCOUNTER — Ambulatory Visit: Admitting: Student

## 2023-09-26 DIAGNOSIS — Z9889 Other specified postprocedural states: Secondary | ICD-10-CM

## 2023-09-26 LAB — SURGICAL PATHOLOGY

## 2023-09-26 NOTE — Progress Notes (Signed)
 Patient is a 68 year old female with history of left breast cancer and macromastia.  She underwent oncoplastic breast reduction with Dr. Carolynne Citron on 08/16/2023.  Patient is about 6 weeks postop.  She presents to the clinic today for postoperative follow-up.             Patient was last seen in the clinic on 09/19/2023.  At this visit, it was noted that patient's postoperative recovery has been complicated by some minor wound breakdown.  The largest site was at the T-zone of the left breast.  The area was clean with no evidence of infection and there was clear healing.  There is also 2 small areas, 1 at the superior portion of the nipple and areolar complex on the left and 1 at the T point on the right.  Plan was for patient to continue with close follow-up and dressing changes.  It was noted that patient would have to delay chemotherapy and radiation until the wounds were more well-healed.  Today, patient reports she is doing well.  She states that she feels her wound is improving.  She reports that she has been doing dressing changes daily to her wounds.  She denies any drainage, fevers or chills.  She does report a little bit of soreness to her left breast.  She does report that pain has improved to the right breast and a little bit to the left breast.  Chaperone present on exam.  On exam, patient is sitting upright in no acute distress.  Breasts are fairly soft and symmetric.  There is a little bit of firmness near where patient had a lumpectomy done, suspect that this is some scar tissue.  There are no overlying skin changes.  No mass noted.  There are no fluid collections.  There is no overlying erythema.  To the right breast, there is a superficial wound noted to the inferior T-zone, with epithelialization noted at the periphery.  To the left NAC, there is a small superficial wound that appears to be almost completely healed.  To the inferior T-zone of the left breast, there is a 3.5 x 1.75 cm wound.   There is healthy appearing tissue throughout the wound.  There is improved epithelialization noted at the periphery.  There is no drainage.  No signs of infection.  Recommended that patient continue to clean her wound with Vashe and to continue with Xeroform dressings to the inferior T-zone left breast wound.  Discussed with her that she can apply Vaseline to the remainder of her incisions and her wounds.  Patient expressed understanding.  I discussed with her that she can dress the left breast inferior T-zone wound with gauze and tape or she may use a nonstick pad or ABD pads.  Patient expressed understanding.  Recommended that patient gently massage the area of firmness to her left breast 1-2 times daily.  She expressed understanding.  Discussed with patient that she may transition into a regular bra with no underwire and she may start gradually increasing her activities.  Patient expressed understanding.  Patient to follow back up in about a week and a half.  I instructed her to call in the meantime if she has any questions or concerns about anything.  Pictures were obtained of the patient and placed in the chart with the patient's or guardian's permission.

## 2023-09-30 ENCOUNTER — Encounter: Payer: Self-pay | Admitting: *Deleted

## 2023-10-02 ENCOUNTER — Other Ambulatory Visit: Payer: Self-pay | Admitting: Internal Medicine

## 2023-10-02 DIAGNOSIS — K219 Gastro-esophageal reflux disease without esophagitis: Secondary | ICD-10-CM

## 2023-10-10 ENCOUNTER — Ambulatory Visit (INDEPENDENT_AMBULATORY_CARE_PROVIDER_SITE_OTHER): Admitting: Student

## 2023-10-10 ENCOUNTER — Encounter: Payer: Self-pay | Admitting: *Deleted

## 2023-10-10 DIAGNOSIS — Z9889 Other specified postprocedural states: Secondary | ICD-10-CM

## 2023-10-10 NOTE — Progress Notes (Signed)
 Patient is a 68 year old female with history of left breast cancer and macromastia.  She underwent oncoplastic breast reduction with Dr. Carolynne Citron on 08/16/2023.  Patient is about 8 weeks postop.  She presents to the clinic today for postoperative follow-up.                 Patient was last seen in the clinic on 09/26/2023.  At this visit, patient was doing well.  On exam, to the right breast, there is a superficial wound noted at the inferior T-zone with epithelialization noted at the periphery.  To the left NAC, there is a small superficial wound that appear to be almost completely healed.  To the inferior T-zone of the left breast, wound was approximately 3.5 x 1.75 cm.  Today, patient reports she is doing well.  She denies any new complaints or concerns.  She denies any fevers or chills.  She denies any drainage.  She states that she feels her wounds have been healing.  She states that she has not had any appointments with radiology since her initial appointment.  Chaperone present on exam.  On exam, patient is sitting upright in no acute distress.  Breasts are fairly soft and symmetric.  There is some firmness underneath lumpectomy scar, similar to previous exam.  There are no fluid collections palpated on exam.  NAC's appear to be healthy.  Wound to the inferior right T-zone appears to be healed.  Wound to the left NAC appears to be healed.  Wound to the left inferior T-zone appears to be almost completely healed with the exception of a small area that still has not completely healed.  There are no signs of infection on exam.  Discussed with patient that given she is almost completely healed, she can go ahead and move forward with her radiation.  I recommended that she continue to apply Vaseline to her inferior T-zone wound.  I discussed with her otherwise she can start using scar creams such as Mederma or silicone tapes.  Patient expressed understanding.  Recommended that patient continue to  massage the area of firmness, especially to her left breast.  I discussed with her that this feels consistent with scar tissue, but if it changes in size or if she has any concerns about it to let us  know immediately.  Patient expressed understanding.  Recommended that patient continue with light range of motion and massage of her breasts.  Patient to follow back up in 1 month.  We would like to see her after she undergoes radiation therapy.  She may adjust that appointment if she has not yet undergone radiation therapy.  She expressed understanding.  I instructed her to call in the meantime she has any questions or concerns about anything.  Pictures were obtained of the patient and placed in the chart with the patient's or guardian's permission.

## 2023-10-14 ENCOUNTER — Encounter: Payer: Self-pay | Admitting: *Deleted

## 2023-10-14 DIAGNOSIS — Z170 Estrogen receptor positive status [ER+]: Secondary | ICD-10-CM

## 2023-10-15 ENCOUNTER — Telehealth: Payer: Self-pay | Admitting: Internal Medicine

## 2023-10-15 NOTE — Telephone Encounter (Signed)
 Copied from CRM [id number]. Topic: Clinical - Medication Question >> Oct 14, 2023  4:54 PM Minus Amel G wrote: Reason for CRM: Ferric Maltol  (ACCRUFER ) 30 MG CAPS  patient was given samples for this medication and would like to know if she can get more or be put on something else because the medication is expensive. Patient has not heard from [facility] about this medication [phone]

## 2023-10-15 NOTE — Telephone Encounter (Signed)
 Copied from CRM [id number]. Topic: Clinical - Prescription Issue >> Oct 14, 2023  4:51 PM Stanly Early wrote: Reason for CRM: carvedilol  (COREG ) 6.25 MG tablet the patient only got 30 pills and its normally patient gets 180 pills for a 90 day supply. The patient takes the pill twice a day.

## 2023-10-16 ENCOUNTER — Ambulatory Visit
Admission: RE | Admit: 2023-10-16 | Discharge: 2023-10-16 | Disposition: A | Discharge status: Home / Self Care | Source: Ambulatory Visit | Attending: Radiation Oncology | Admitting: Radiation Oncology

## 2023-10-16 ENCOUNTER — Other Ambulatory Visit: Payer: Self-pay | Admitting: Internal Medicine

## 2023-10-16 ENCOUNTER — Other Ambulatory Visit: Payer: Self-pay

## 2023-10-16 DIAGNOSIS — I1A0 Resistant hypertension: Secondary | ICD-10-CM

## 2023-10-16 DIAGNOSIS — I119 Hypertensive heart disease without heart failure: Secondary | ICD-10-CM

## 2023-10-16 DIAGNOSIS — Z170 Estrogen receptor positive status [ER+]: Secondary | ICD-10-CM | POA: Diagnosis not present

## 2023-10-16 DIAGNOSIS — Z510 Encounter for antineoplastic radiation therapy: Secondary | ICD-10-CM | POA: Diagnosis not present

## 2023-10-16 DIAGNOSIS — C50212 Malignant neoplasm of upper-inner quadrant of left female breast: Secondary | ICD-10-CM | POA: Insufficient documentation

## 2023-10-16 MED ORDER — CARVEDILOL 6.25 MG PO TABS: 6.2500 mg | ORAL_TABLET | Freq: Two times a day (BID) | ORAL | 1 refills | Status: DC

## 2023-10-16 NOTE — Telephone Encounter (Deleted)
 Copied from CRM [id number]. Topic: Clinical - Medical Advice >> Oct 16, 2023 11:56 AM Kayla Lowe wrote: Reason for CRM: patient is starting radiation 6/18, will that iron infusion interfere with the radiation. Please contact when available

## 2023-10-16 NOTE — Telephone Encounter (Signed)
 Please get the form for an iron infusion

## 2023-10-16 NOTE — Telephone Encounter (Signed)
 Patient has been made aware and gave a verbal understanding.

## 2023-10-16 NOTE — Telephone Encounter (Signed)
 Please advise if you are going to change her medication. I have sent in the prior medication request

## 2023-10-16 NOTE — Telephone Encounter (Signed)
 Copied from CRM [id number]. Topic: Clinical - Medical Advice >> Oct 16, 2023 11:56 AM Dimple Francis wrote: Reason for CRM: patient is starting radiation 6/18, will that iron infusion interfere with the radiation. Please contact when available

## 2023-10-17 ENCOUNTER — Other Ambulatory Visit: Payer: Self-pay

## 2023-10-17 ENCOUNTER — Other Ambulatory Visit: Payer: Self-pay | Admitting: Internal Medicine

## 2023-10-17 ENCOUNTER — Other Ambulatory Visit (HOSPITAL_COMMUNITY): Payer: Self-pay

## 2023-10-17 DIAGNOSIS — E785 Hyperlipidemia, unspecified: Secondary | ICD-10-CM

## 2023-10-18 ENCOUNTER — Telehealth: Payer: Self-pay

## 2023-10-18 NOTE — Telephone Encounter (Signed)
 Auth Submission: NO AUTH NEEDED Site of care: Site of care: [facility] Payer: [employer] medicare Medication & CPT/J Code(s) submitted: Venofer (Iron Sucrose) J1756 Route of submission (phone, fax, portal):  Phone # Fax # Auth type: Buy/Bill PB Units/visits requested: 200mg  x 5 doses Reference number:  Approval from: 10/18/23 to 02/17/24

## 2023-10-18 NOTE — Telephone Encounter (Signed)
 Will It?

## 2023-10-20 ENCOUNTER — Other Ambulatory Visit: Payer: Self-pay | Admitting: Internal Medicine

## 2023-10-20 DIAGNOSIS — I10 Essential (primary) hypertension: Secondary | ICD-10-CM

## 2023-10-20 DIAGNOSIS — I1A0 Resistant hypertension: Secondary | ICD-10-CM

## 2023-10-20 DIAGNOSIS — I119 Hypertensive heart disease without heart failure: Secondary | ICD-10-CM

## 2023-10-20 DIAGNOSIS — J301 Allergic rhinitis due to pollen: Secondary | ICD-10-CM

## 2023-10-21 ENCOUNTER — Encounter: Payer: Self-pay | Admitting: Internal Medicine

## 2023-10-21 ENCOUNTER — Ambulatory Visit: Payer: Self-pay | Attending: Surgery

## 2023-10-21 ENCOUNTER — Other Ambulatory Visit: Payer: Self-pay | Admitting: Internal Medicine

## 2023-10-21 ENCOUNTER — Ambulatory Visit: Payer: Medicare Other | Admitting: Internal Medicine

## 2023-10-21 VITALS — Wt 198.2 lb

## 2023-10-21 DIAGNOSIS — Z483 Aftercare following surgery for neoplasm: Secondary | ICD-10-CM | POA: Insufficient documentation

## 2023-10-21 DIAGNOSIS — E785 Hyperlipidemia, unspecified: Secondary | ICD-10-CM

## 2023-10-21 NOTE — Therapy (Signed)
 OUTPATIENT PHYSICAL THERAPY SOZO SCREENING NOTE   Patient Name: Kayla Lowe  MRN: [id number] DOB:[birth year], 68 y.o., female Today's Date: 10/21/2023  PCP: Arcadio Knuckles, MD REFERRING PROVIDER: Oza Blumenthal, MD   PT End of Session - 10/21/23 [id number]     Visit Number 2   # unchanged due to screen only   PT Start Time 0904    PT Stop Time 0910    PT Time Calculation (min) 6 min    Activity Tolerance Patient tolerated treatment well    Behavior During Therapy [facility] for tasks assessed/performed             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   Osteoarthritis    Past Surgical History:  Procedure Laterality Date   ABDOMINAL HYSTERECTOMY  1987   BREAST BIOPSY Left 05/28/2023   US  LT BREAST BX W LOC DEV EA ADD LESION IMG BX SPEC US  GUIDE 05/28/2023 [facility]   BREAST BIOPSY Left 05/28/2023   US  LT BREAST BX W LOC DEV 1ST LESION IMG BX SPEC US  GUIDE 05/28/2023 [facility]   BREAST BIOPSY Left 07/02/2023   US  LT RADIOACTIVE SEED EA ADD LESION 07/02/2023 [facility]   BREAST BIOPSY Left 07/02/2023   US  LT RADIOACTIVE SEED LOC 07/02/2023 [facility]   BREAST LUMPECTOMY WITH RADIOACTIVE SEED AND SENTINEL LYMPH NODE BIOPSY Left 07/03/2023   Procedure: RADIOACTIVE SEED GUIDED LEFT BREAST LUMPECTOMY x2 AND LEFT SENTINEL NODE BIOPSY;  Surgeon: Oza Blumenthal, MD;  Location: [facility];  Service: General;  Laterality: Left;  LMA   BREAST REDUCTION SURGERY Bilateral 08/16/2023   Procedure: MAMMOPLASTY, REDUCTION;  Surgeon: Teretha Ferguson, MD;  Location: [facility];  Service: Plastics;  Laterality: Bilateral;   LAPAROTOMY N/A 02/04/2023   Procedure: EXPLORATORY LAPAROTOMY, WITH SMALL  BOWEL RESECTION;  Surgeon: Oza Blumenthal, MD;  Location: [facility];  Service: General;  Laterality: N/A;   Patient Active Problem List   Diagnosis Date Noted   Vaginal polyp 09/11/2023   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   Malignant neoplasm of upper-inner quadrant of left breast in female, estrogen receptor positive (HCC) 06/14/2023   Iron deficiency anemia due to chronic blood loss 03/13/2023   Nocturnal muscle cramps 10/17/2022   LVH (left ventricular hypertrophy) due to hypertensive disease, without heart failure 01/17/2022   Encounter for general adult medical examination with abnormal findings 01/09/2021   Visit for screening mammogram 07/12/2020   Primary osteoarthritis of both knees 07/11/2020   Intrinsic eczema 03/14/2020   Mild persistent asthma without complication 03/14/2020   Vitamin D  deficiency disease 09/18/2019   Familial hypercholesteremia 09/16/2019   Morton's neuroma of left foot 07/20/2019   Gastroesophageal reflux disease without esophagitis 02/09/2019   Osteopenia of neck of right femur 02/09/2019   Seasonal allergic rhinitis due to pollen 08/12/2017   Elevated rheumatoid factor 02/06/2016   Dyslipidemia, goal LDL below 130 09/10/2013    REFERRING DIAG: left breast cancer at risk for lymphedema  THERAPY DIAG:  Aftercare following surgery for neoplasm  PERTINENT HISTORY: Pt has two small areas in the left breast measuring 1cm and 7mm. ER/PR positive, HER2 negative. lumpectomy with SLNB on 07/03/23 with 5 negative nodes removed.  with oncotype of 28 - will be starting chemotherapy with TC every 21 days x 4  and then pt will have radiation. Also new dx of marginal zone lymphoma - just monitoring this.  Breast reduction scheduled for 08/16/23.   PRECAUTIONS: left UE Lymphedema risk, None  SUBJECTIVE: Pt returns for her first 3 month L-Dex screen.   PAIN:  Are you having pain? No  SOZO SCREENING: Patient was assessed today using the SOZO machine to determine  the lymphedema index score. This was compared to her baseline score. It was determined that she is within the recommended range when compared to her baseline and no further action is needed at this time. She will continue SOZO screenings. These are done every 3 months for 2 years post operatively followed by every 6 months for 2 years, and then annually.   L-DEX FLOWSHEETS - 10/21/23 0900       L-DEX LYMPHEDEMA SCREENING   Measurement Type Unilateral    L-DEX MEASUREMENT EXTREMITY Upper Extremity    POSITION  Standing    DOMINANT SIDE Left    At Risk Side Left    BASELINE SCORE (UNILATERAL) -8    L-DEX SCORE (UNILATERAL) -8.1    VALUE CHANGE (UNILAT) -0.1               Denyce Flank, PTA 10/21/2023, 9:11 AM

## 2023-10-23 ENCOUNTER — Other Ambulatory Visit: Payer: Self-pay | Admitting: Internal Medicine

## 2023-10-23 DIAGNOSIS — D500 Iron deficiency anemia secondary to blood loss (chronic): Secondary | ICD-10-CM

## 2023-10-23 MED ORDER — FERROUS SULFATE 325 (65 FE) MG PO TABS: 325.0000 mg | ORAL_TABLET | Freq: Every day | ORAL | 1 refills | Status: DC

## 2023-10-23 NOTE — Telephone Encounter (Signed)
 LVM making patient aware of this. Advised to call back with further questions

## 2023-10-29 DIAGNOSIS — Z510 Encounter for antineoplastic radiation therapy: Secondary | ICD-10-CM | POA: Diagnosis not present

## 2023-10-29 DIAGNOSIS — C50212 Malignant neoplasm of upper-inner quadrant of left female breast: Secondary | ICD-10-CM | POA: Diagnosis not present

## 2023-10-29 DIAGNOSIS — Z170 Estrogen receptor positive status [ER+]: Secondary | ICD-10-CM | POA: Diagnosis not present

## 2023-10-30 ENCOUNTER — Ambulatory Visit
Admission: RE | Admit: 2023-10-30 | Discharge: 2023-10-30 | Disposition: A | Discharge status: Home / Self Care | Source: Ambulatory Visit | Attending: Radiation Oncology | Admitting: Radiation Oncology

## 2023-10-30 ENCOUNTER — Other Ambulatory Visit: Payer: Self-pay

## 2023-10-30 DIAGNOSIS — Z170 Estrogen receptor positive status [ER+]: Secondary | ICD-10-CM | POA: Diagnosis not present

## 2023-10-30 DIAGNOSIS — C50212 Malignant neoplasm of upper-inner quadrant of left female breast: Secondary | ICD-10-CM | POA: Diagnosis not present

## 2023-10-30 DIAGNOSIS — Z510 Encounter for antineoplastic radiation therapy: Secondary | ICD-10-CM | POA: Diagnosis not present

## 2023-10-30 LAB — RAD ONC ARIA SESSION SUMMARY
Course Elapsed Days: 0
Plan Fractions Treated to Date: 1
Plan Prescribed Dose Per Fraction: 2.66 Gy
Plan Total Fractions Prescribed: 16
Plan Total Prescribed Dose: 42.56 Gy
Reference Point Dosage Given to Date: 2.66 Gy
Reference Point Session Dosage Given: 2.66 Gy
Session Number: 1

## 2023-10-31 ENCOUNTER — Telehealth: Payer: Self-pay | Admitting: Licensed Clinical Social Worker

## 2023-10-31 ENCOUNTER — Other Ambulatory Visit: Payer: Self-pay | Admitting: Internal Medicine

## 2023-10-31 ENCOUNTER — Ambulatory Visit
Admission: RE | Admit: 2023-10-31 | Discharge: 2023-10-31 | Disposition: A | Discharge status: Home / Self Care | Source: Ambulatory Visit | Attending: Radiation Oncology | Admitting: Radiation Oncology

## 2023-10-31 ENCOUNTER — Other Ambulatory Visit: Payer: Self-pay

## 2023-10-31 DIAGNOSIS — Z170 Estrogen receptor positive status [ER+]: Secondary | ICD-10-CM | POA: Diagnosis not present

## 2023-10-31 DIAGNOSIS — Z510 Encounter for antineoplastic radiation therapy: Secondary | ICD-10-CM | POA: Diagnosis not present

## 2023-10-31 DIAGNOSIS — C50212 Malignant neoplasm of upper-inner quadrant of left female breast: Secondary | ICD-10-CM | POA: Diagnosis not present

## 2023-10-31 DIAGNOSIS — R252 Cramp and spasm: Secondary | ICD-10-CM

## 2023-10-31 DIAGNOSIS — G8929 Other chronic pain: Secondary | ICD-10-CM

## 2023-10-31 LAB — RAD ONC ARIA SESSION SUMMARY
Course Elapsed Days: 1
Plan Fractions Treated to Date: 2
Plan Prescribed Dose Per Fraction: 2.66 Gy
Plan Total Fractions Prescribed: 16
Plan Total Prescribed Dose: 42.56 Gy
Reference Point Dosage Given to Date: 5.32 Gy
Reference Point Session Dosage Given: 2.66 Gy
Session Number: 2

## 2023-10-31 NOTE — Telephone Encounter (Signed)
 [facility] Clinical Social Work  Clinical Social Work was referred by Building control surveyor for assessment of psychosocial needs.  Clinical Social Worker contacted patient by phone to offer support and assess for needs.   Patient reports some depression symptoms with everything from diagnosis and treatment happening so quickly. She is interested in counseling. Pt also reports that there are some financial needs.  Pt scheduled for 8:30am tomorrow 11/01/23 to evaluate further and initiate counseling.     Alexiss Iturralde E Tiegan Jambor, LCSW  Clinical Social Worker [facility]

## 2023-11-01 ENCOUNTER — Ambulatory Visit
Admission: RE | Admit: 2023-11-01 | Discharge: 2023-11-01 | Disposition: A | Discharge status: Home / Self Care | Source: Ambulatory Visit | Attending: Radiation Oncology | Admitting: Radiation Oncology

## 2023-11-01 ENCOUNTER — Other Ambulatory Visit: Payer: Self-pay

## 2023-11-01 ENCOUNTER — Inpatient Hospital Stay: Attending: Hematology and Oncology | Admitting: Licensed Clinical Social Worker

## 2023-11-01 DIAGNOSIS — C50212 Malignant neoplasm of upper-inner quadrant of left female breast: Secondary | ICD-10-CM

## 2023-11-01 DIAGNOSIS — Z170 Estrogen receptor positive status [ER+]: Secondary | ICD-10-CM | POA: Diagnosis not present

## 2023-11-01 DIAGNOSIS — Z510 Encounter for antineoplastic radiation therapy: Secondary | ICD-10-CM | POA: Diagnosis not present

## 2023-11-01 LAB — RAD ONC ARIA SESSION SUMMARY
Course Elapsed Days: 2
Plan Fractions Treated to Date: 3
Plan Prescribed Dose Per Fraction: 2.66 Gy
Plan Total Fractions Prescribed: 16
Plan Total Prescribed Dose: 42.56 Gy
Reference Point Dosage Given to Date: 7.98 Gy
Reference Point Session Dosage Given: 2.66 Gy
Session Number: 3

## 2023-11-01 MED ORDER — ALRA NON-METALLIC DEODORANT (RAD-ONC)
1.0000 | Freq: Once | TOPICAL | Status: AC
  Administered 2023-11-01: 1 via TOPICAL

## 2023-11-01 MED ORDER — RADIAPLEXRX EX GEL: Freq: Once | CUTANEOUS | Status: AC

## 2023-11-01 NOTE — Progress Notes (Signed)
 [facility] CSW Counseling Note  Patient was referred by self. Treatment type: Individual  Presenting Concerns: Patient and/or family reports the following symptoms/concerns: depression and grief Duration of problem: 6 months; Severity of problem: mild   Orientation:oriented to person, place, time/date, and situation.   Affect: Appropriate, Congruent, and Tearful Risk of harm to self or others: No plan to harm self or others  Patient and/or Family's Strengths/Protective Factors: Sense of purposeAbility for insight  Capable of independent living  Communication skills  Religious Affiliation    Family/Social Information:  Housing Arrangement: patient lives in an apartment. She has a roommate Family members/support persons in your life? Family (sister, brother, kids), friend and Estate agent concerns: has a car. May need gas assistance  Employment: Retired. 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 Considering going back with limitations.  Income source: Actor concerns: Yes, due to illness and/or loss of work during treatment Type of concern: Lobbyist access concerns: yes, receives food stamps Religious or spiritual practice: Dawn Eth been involved in ministry for 30 years and recently assigned a church Advanced directives: No Services Currently in place:  food stamps, [employer] & Medicaid   Goals Addressed: Patient will:  Reduce symptoms of: depression Increase knowledge and/or ability of: coping skills  Increase healthy adjustment to current life circumstances and Begin healthy grieving over loss   Progress towards Goals: Initial   Interventions: Interventions utilized:  Supportive and Other: Narrative       11/01/2023   10:38 AM 08/07/2023    9:05 AM 06/19/2023    9:51 AM  PHQ9 SCORE ONLY  PHQ-9 Total Score 5 0 0      Assessment: Patient currently experiencing increased sadness  and grief as she processes her experience with cancer, particularly surrounding the support, or lack thereof, that she received from someone who was her friend and she has been there for many times before. Pt values people showing up physically and did not receive this care.  Pt is typically private and does not share often, but needs a space to be supported. She shared part of her upbringing and prior difficulties with losing her parents through life review.   Additionally, due to financial stressors, discussed potential foundations and grants for assistance. Pt will bring in paperwork on Monday. CSW sent referral to S. Arlena Lacrosse for the [employer].   Plan: Follow up with CSW: 1 week Behavioral recommendations: bring in paperwork for grants. Continue to set boundaries to help prioritize yourself and your healing Referral(s): [employer] grant. Will refer/apply yo [employer] once documents are received       Bridey Brookover E Nils Thor, LCSW

## 2023-11-04 ENCOUNTER — Ambulatory Visit
Admission: RE | Admit: 2023-11-04 | Discharge: 2023-11-04 | Disposition: A | Discharge status: Home / Self Care | Source: Ambulatory Visit | Attending: Radiation Oncology | Admitting: Radiation Oncology

## 2023-11-04 ENCOUNTER — Other Ambulatory Visit: Payer: Self-pay

## 2023-11-04 ENCOUNTER — Encounter: Payer: Self-pay | Admitting: Internal Medicine

## 2023-11-04 DIAGNOSIS — Z170 Estrogen receptor positive status [ER+]: Secondary | ICD-10-CM | POA: Diagnosis not present

## 2023-11-04 DIAGNOSIS — Z510 Encounter for antineoplastic radiation therapy: Secondary | ICD-10-CM | POA: Diagnosis not present

## 2023-11-04 DIAGNOSIS — C50212 Malignant neoplasm of upper-inner quadrant of left female breast: Secondary | ICD-10-CM | POA: Diagnosis not present

## 2023-11-04 LAB — RAD ONC ARIA SESSION SUMMARY
Course Elapsed Days: 5
Plan Fractions Treated to Date: 4
Plan Prescribed Dose Per Fraction: 2.66 Gy
Plan Total Fractions Prescribed: 16
Plan Total Prescribed Dose: 42.56 Gy
Reference Point Dosage Given to Date: 10.64 Gy
Reference Point Session Dosage Given: 2.66 Gy
Session Number: 4

## 2023-11-05 ENCOUNTER — Ambulatory Visit
Admission: RE | Admit: 2023-11-05 | Discharge: 2023-11-05 | Disposition: A | Discharge status: Home / Self Care | Source: Ambulatory Visit | Attending: Radiation Oncology

## 2023-11-05 ENCOUNTER — Telehealth: Payer: Self-pay | Admitting: Internal Medicine

## 2023-11-05 ENCOUNTER — Other Ambulatory Visit: Payer: Self-pay

## 2023-11-05 ENCOUNTER — Encounter: Payer: Self-pay | Admitting: Licensed Clinical Social Worker

## 2023-11-05 ENCOUNTER — Encounter: Payer: Self-pay | Admitting: Radiation Oncology

## 2023-11-05 DIAGNOSIS — Z510 Encounter for antineoplastic radiation therapy: Secondary | ICD-10-CM | POA: Diagnosis not present

## 2023-11-05 DIAGNOSIS — C50212 Malignant neoplasm of upper-inner quadrant of left female breast: Secondary | ICD-10-CM | POA: Diagnosis not present

## 2023-11-05 DIAGNOSIS — Z170 Estrogen receptor positive status [ER+]: Secondary | ICD-10-CM | POA: Diagnosis not present

## 2023-11-05 LAB — RAD ONC ARIA SESSION SUMMARY
Course Elapsed Days: 6
Plan Fractions Treated to Date: 5
Plan Prescribed Dose Per Fraction: 2.66 Gy
Plan Total Fractions Prescribed: 16
Plan Total Prescribed Dose: 42.56 Gy
Reference Point Dosage Given to Date: 13.3 Gy
Reference Point Session Dosage Given: 2.66 Gy
Session Number: 5

## 2023-11-05 NOTE — Telephone Encounter (Unsigned)
 Copied from CRM [id number]. Topic: Clinical - Medication Prior Auth >> Nov 05, 2023 11:28 AM Gennette ORN wrote: Reason for CRM: Patient is calling about tiZANidine  (ZANAFLEX ) 2 MG tablet needing prior authorization so this medication can get refilled. Pharmacy is waiting on this prior authorization.

## 2023-11-05 NOTE — Progress Notes (Unsigned)
 [facility] CSW Progress Note  Visual merchandiser received income documents from patient for [employer] application and [employer].    Interventions: Sent income documents to S. [place] for [employer] to [employer] on pt's behalf Referral sent to [employer]      Follow Up Plan:  CSW will see patient on 6/26 for counseling    Alok Minshall E Mable Dara, LCSW Clinical Social Worker [facility]

## 2023-11-05 NOTE — Progress Notes (Signed)
 Received documents to apply for [employer] from Child psychotherapist.  Patient just needs to complete grant paperwork at next visit. Advised registration via teams message.

## 2023-11-06 ENCOUNTER — Ambulatory Visit
Admission: RE | Admit: 2023-11-06 | Discharge: 2023-11-06 | Disposition: A | Discharge status: Home / Self Care | Source: Ambulatory Visit | Attending: Radiation Oncology

## 2023-11-06 ENCOUNTER — Other Ambulatory Visit: Payer: Self-pay

## 2023-11-06 DIAGNOSIS — Z510 Encounter for antineoplastic radiation therapy: Secondary | ICD-10-CM | POA: Diagnosis not present

## 2023-11-06 DIAGNOSIS — C50212 Malignant neoplasm of upper-inner quadrant of left female breast: Secondary | ICD-10-CM | POA: Diagnosis not present

## 2023-11-06 DIAGNOSIS — Z170 Estrogen receptor positive status [ER+]: Secondary | ICD-10-CM | POA: Diagnosis not present

## 2023-11-06 LAB — RAD ONC ARIA SESSION SUMMARY
Course Elapsed Days: 7
Plan Fractions Treated to Date: 6
Plan Prescribed Dose Per Fraction: 2.66 Gy
Plan Total Fractions Prescribed: 16
Plan Total Prescribed Dose: 42.56 Gy
Reference Point Dosage Given to Date: 15.96 Gy
Reference Point Session Dosage Given: 2.66 Gy
Session Number: 6

## 2023-11-07 ENCOUNTER — Ambulatory Visit
Admission: RE | Admit: 2023-11-07 | Discharge: 2023-11-07 | Disposition: A | Discharge status: Home / Self Care | Source: Ambulatory Visit | Attending: Radiation Oncology | Admitting: Radiation Oncology

## 2023-11-07 ENCOUNTER — Other Ambulatory Visit: Payer: Self-pay | Admitting: Internal Medicine

## 2023-11-07 ENCOUNTER — Inpatient Hospital Stay: Admitting: Licensed Clinical Social Worker

## 2023-11-07 ENCOUNTER — Other Ambulatory Visit: Payer: Self-pay

## 2023-11-07 DIAGNOSIS — I119 Hypertensive heart disease without heart failure: Secondary | ICD-10-CM

## 2023-11-07 DIAGNOSIS — Z170 Estrogen receptor positive status [ER+]: Secondary | ICD-10-CM | POA: Diagnosis not present

## 2023-11-07 DIAGNOSIS — Z510 Encounter for antineoplastic radiation therapy: Secondary | ICD-10-CM | POA: Diagnosis not present

## 2023-11-07 DIAGNOSIS — I1A0 Resistant hypertension: Secondary | ICD-10-CM

## 2023-11-07 DIAGNOSIS — C50212 Malignant neoplasm of upper-inner quadrant of left female breast: Secondary | ICD-10-CM | POA: Diagnosis not present

## 2023-11-07 LAB — RAD ONC ARIA SESSION SUMMARY
Course Elapsed Days: 8
Plan Fractions Treated to Date: 7
Plan Prescribed Dose Per Fraction: 2.66 Gy
Plan Total Fractions Prescribed: 16
Plan Total Prescribed Dose: 42.56 Gy
Reference Point Dosage Given to Date: 18.62 Gy
Reference Point Session Dosage Given: 2.66 Gy
Session Number: 7

## 2023-11-07 NOTE — Progress Notes (Signed)
 Patient is a 68 year old female with history of left breast cancer and macromastia. She underwent oncoplastic breast reduction with Dr. Waddell on 08/16/2023.  She is almost 3 months postop.  She presents to the clinic today for postoperative follow-up.  She was last seen in the clinic on 10/10/2023.  At this visit, patient was doing well.  On exam, breasts were soft and fairly symmetric.  There was some firmness underneath the lumpectomy scar, similar to the previous exam.  NAC's were healthy.  Wound to the inferior T-zone of the right breast appeared to be healed.  Wound to the left NAC appear to be healed.  Wound to the left inferior T-zone looked almost completely healed with the exception of a small area.  It was recommended that patient move forward with her radiation.  Today, patient reports she is doing well.  She states that she has received 8 treatments of radiation to her left side.  She denies any issues or concerns today with her breasts.  She denies any fevers or chills.  Chaperone present on exam.  On exam, patient is sitting upright in no acute distress.  Breasts are overall soft and fairly symmetric.  There was a little bit of firmness underneath her lumpectomy scar, but it does appear to be improved from previous exam.  There does appear to be some mild erythema noted over her left breast consistent with radiation changes.  Otherwise there is no overlying erythema.  There are no obvious fluid collections palpated on exam.  NAC's appear to be healthy bilaterally.  Incisions appear to be intact and well-healed, wound to the left inferior T-zone appears to be completely healed.  There are no signs of infection on exam.  Recommended that patient continue with massage to the area of firmness.  Discussed with her that she may continue with scar cream to her right breast.  Recommended that she continue with the cream prescribed by radiation oncology to her left breast.  Patient expressed  understanding.  Patient to follow back up in about 3 months for reevaluation.  I instructed the patient to call in the meantime if she has any questions or concerns about anything.  Pictures were obtained of the patient and placed in the chart with the patient's or guardian's permission.

## 2023-11-08 ENCOUNTER — Ambulatory Visit
Admission: RE | Admit: 2023-11-08 | Discharge: 2023-11-08 | Disposition: A | Discharge status: Home / Self Care | Source: Ambulatory Visit | Attending: Radiation Oncology | Admitting: Radiation Oncology

## 2023-11-08 ENCOUNTER — Ambulatory Visit (INDEPENDENT_AMBULATORY_CARE_PROVIDER_SITE_OTHER): Admitting: Student

## 2023-11-08 ENCOUNTER — Ambulatory Visit
Admission: RE | Admit: 2023-11-08 | Discharge: 2023-11-08 | Disposition: A | Discharge status: Home / Self Care | Source: Ambulatory Visit | Attending: Radiation Oncology

## 2023-11-08 ENCOUNTER — Other Ambulatory Visit: Payer: Self-pay

## 2023-11-08 VITALS — BP 135/80 | HR 84

## 2023-11-08 DIAGNOSIS — Z170 Estrogen receptor positive status [ER+]: Secondary | ICD-10-CM | POA: Diagnosis not present

## 2023-11-08 DIAGNOSIS — C50212 Malignant neoplasm of upper-inner quadrant of left female breast: Secondary | ICD-10-CM | POA: Diagnosis not present

## 2023-11-08 DIAGNOSIS — Z510 Encounter for antineoplastic radiation therapy: Secondary | ICD-10-CM | POA: Diagnosis not present

## 2023-11-08 DIAGNOSIS — Z9889 Other specified postprocedural states: Secondary | ICD-10-CM

## 2023-11-08 LAB — RAD ONC ARIA SESSION SUMMARY
Course Elapsed Days: 9
Plan Fractions Treated to Date: 8
Plan Prescribed Dose Per Fraction: 2.66 Gy
Plan Total Fractions Prescribed: 16
Plan Total Prescribed Dose: 42.56 Gy
Reference Point Dosage Given to Date: 21.28 Gy
Reference Point Session Dosage Given: 2.66 Gy
Session Number: 8

## 2023-11-08 NOTE — Telephone Encounter (Signed)
Patient has gotten her medication

## 2023-11-11 ENCOUNTER — Other Ambulatory Visit: Payer: Self-pay

## 2023-11-11 ENCOUNTER — Inpatient Hospital Stay: Admitting: Licensed Clinical Social Worker

## 2023-11-11 ENCOUNTER — Ambulatory Visit
Admission: RE | Admit: 2023-11-11 | Discharge: 2023-11-11 | Disposition: A | Discharge status: Home / Self Care | Source: Ambulatory Visit | Attending: Radiation Oncology

## 2023-11-11 DIAGNOSIS — Z170 Estrogen receptor positive status [ER+]: Secondary | ICD-10-CM | POA: Diagnosis not present

## 2023-11-11 DIAGNOSIS — C50212 Malignant neoplasm of upper-inner quadrant of left female breast: Secondary | ICD-10-CM | POA: Diagnosis not present

## 2023-11-11 DIAGNOSIS — Z510 Encounter for antineoplastic radiation therapy: Secondary | ICD-10-CM | POA: Diagnosis not present

## 2023-11-11 LAB — RAD ONC ARIA SESSION SUMMARY
Course Elapsed Days: 12
Plan Fractions Treated to Date: 9
Plan Prescribed Dose Per Fraction: 2.66 Gy
Plan Total Fractions Prescribed: 16
Plan Total Prescribed Dose: 42.56 Gy
Reference Point Dosage Given to Date: 23.94 Gy
Reference Point Session Dosage Given: 2.66 Gy
Session Number: 9

## 2023-11-11 NOTE — Progress Notes (Signed)
 [facility] CSW Progress Note  Clinical Child psychotherapist contacted patient by phone to follow-up on financial concerns and counseling.  Updated pt on grants: - Approved for assistance through [employer] - Receiving gas & food card from [employer] - Provided contact information for CANDIE Georgi for [employer]   Interventions: Provided patient with information about grant approvals as above  Rescheduled missed counseling appt     Follow Up Plan:  CSW will see patient on 11/19/23 at 8am    Yerachmiel Spinney E Floye Fesler, LCSW Clinical Social Worker [facility]

## 2023-11-12 ENCOUNTER — Ambulatory Visit
Admission: RE | Admit: 2023-11-12 | Discharge: 2023-11-12 | Disposition: A | Discharge status: Home / Self Care | Source: Ambulatory Visit | Attending: Radiation Oncology | Admitting: Radiation Oncology

## 2023-11-12 ENCOUNTER — Other Ambulatory Visit: Payer: Self-pay

## 2023-11-12 DIAGNOSIS — C50212 Malignant neoplasm of upper-inner quadrant of left female breast: Secondary | ICD-10-CM | POA: Diagnosis not present

## 2023-11-12 DIAGNOSIS — Z510 Encounter for antineoplastic radiation therapy: Secondary | ICD-10-CM | POA: Insufficient documentation

## 2023-11-12 DIAGNOSIS — Z170 Estrogen receptor positive status [ER+]: Secondary | ICD-10-CM | POA: Insufficient documentation

## 2023-11-12 DIAGNOSIS — K432 Incisional hernia without obstruction or gangrene: Secondary | ICD-10-CM | POA: Diagnosis not present

## 2023-11-12 DIAGNOSIS — C8589 Other specified types of non-Hodgkin lymphoma, extranodal and solid organ sites: Secondary | ICD-10-CM | POA: Diagnosis not present

## 2023-11-12 LAB — RAD ONC ARIA SESSION SUMMARY
Course Elapsed Days: 13
Plan Fractions Treated to Date: 10
Plan Prescribed Dose Per Fraction: 2.66 Gy
Plan Total Fractions Prescribed: 16
Plan Total Prescribed Dose: 42.56 Gy
Reference Point Dosage Given to Date: 26.6 Gy
Reference Point Session Dosage Given: 2.66 Gy
Session Number: 10

## 2023-11-13 ENCOUNTER — Ambulatory Visit
Admission: RE | Admit: 2023-11-13 | Discharge: 2023-11-13 | Disposition: A | Discharge status: Home / Self Care | Source: Ambulatory Visit | Attending: Radiation Oncology | Admitting: Radiation Oncology

## 2023-11-13 ENCOUNTER — Other Ambulatory Visit: Payer: Self-pay

## 2023-11-13 DIAGNOSIS — C50212 Malignant neoplasm of upper-inner quadrant of left female breast: Secondary | ICD-10-CM | POA: Diagnosis not present

## 2023-11-13 DIAGNOSIS — Z510 Encounter for antineoplastic radiation therapy: Secondary | ICD-10-CM | POA: Diagnosis not present

## 2023-11-13 DIAGNOSIS — Z170 Estrogen receptor positive status [ER+]: Secondary | ICD-10-CM | POA: Diagnosis not present

## 2023-11-13 LAB — RAD ONC ARIA SESSION SUMMARY
Course Elapsed Days: 14
Plan Fractions Treated to Date: 11
Plan Prescribed Dose Per Fraction: 2.66 Gy
Plan Total Fractions Prescribed: 16
Plan Total Prescribed Dose: 42.56 Gy
Reference Point Dosage Given to Date: 29.26 Gy
Reference Point Session Dosage Given: 2.66 Gy
Session Number: 11

## 2023-11-14 ENCOUNTER — Other Ambulatory Visit: Payer: Self-pay

## 2023-11-14 ENCOUNTER — Ambulatory Visit: Admitting: Radiation Oncology

## 2023-11-14 ENCOUNTER — Ambulatory Visit
Admission: RE | Admit: 2023-11-14 | Discharge: 2023-11-14 | Disposition: A | Discharge status: Home / Self Care | Source: Ambulatory Visit | Attending: Radiation Oncology | Admitting: Radiation Oncology

## 2023-11-14 DIAGNOSIS — Z170 Estrogen receptor positive status [ER+]: Secondary | ICD-10-CM | POA: Diagnosis not present

## 2023-11-14 DIAGNOSIS — Z510 Encounter for antineoplastic radiation therapy: Secondary | ICD-10-CM | POA: Diagnosis not present

## 2023-11-14 DIAGNOSIS — C50212 Malignant neoplasm of upper-inner quadrant of left female breast: Secondary | ICD-10-CM | POA: Diagnosis not present

## 2023-11-14 LAB — RAD ONC ARIA SESSION SUMMARY
Course Elapsed Days: 15
Plan Fractions Treated to Date: 12
Plan Prescribed Dose Per Fraction: 2.66 Gy
Plan Total Fractions Prescribed: 16
Plan Total Prescribed Dose: 42.56 Gy
Reference Point Dosage Given to Date: 31.92 Gy
Reference Point Session Dosage Given: 2.66 Gy
Session Number: 12

## 2023-11-16 DIAGNOSIS — Z170 Estrogen receptor positive status [ER+]: Secondary | ICD-10-CM | POA: Diagnosis not present

## 2023-11-16 DIAGNOSIS — C50212 Malignant neoplasm of upper-inner quadrant of left female breast: Secondary | ICD-10-CM | POA: Diagnosis not present

## 2023-11-16 DIAGNOSIS — Z510 Encounter for antineoplastic radiation therapy: Secondary | ICD-10-CM | POA: Diagnosis not present

## 2023-11-18 ENCOUNTER — Ambulatory Visit
Admission: RE | Admit: 2023-11-18 | Discharge: 2023-11-18 | Disposition: A | Discharge status: Home / Self Care | Source: Ambulatory Visit | Attending: Radiation Oncology

## 2023-11-18 ENCOUNTER — Other Ambulatory Visit: Payer: Self-pay

## 2023-11-18 DIAGNOSIS — C50212 Malignant neoplasm of upper-inner quadrant of left female breast: Secondary | ICD-10-CM | POA: Diagnosis not present

## 2023-11-18 DIAGNOSIS — Z510 Encounter for antineoplastic radiation therapy: Secondary | ICD-10-CM | POA: Diagnosis not present

## 2023-11-18 DIAGNOSIS — Z170 Estrogen receptor positive status [ER+]: Secondary | ICD-10-CM | POA: Diagnosis not present

## 2023-11-18 LAB — RAD ONC ARIA SESSION SUMMARY
Course Elapsed Days: 19
Plan Fractions Treated to Date: 13
Plan Prescribed Dose Per Fraction: 2.66 Gy
Plan Total Fractions Prescribed: 16
Plan Total Prescribed Dose: 42.56 Gy
Reference Point Dosage Given to Date: 34.58 Gy
Reference Point Session Dosage Given: 2.66 Gy
Session Number: 13

## 2023-11-19 ENCOUNTER — Other Ambulatory Visit: Payer: Self-pay

## 2023-11-19 ENCOUNTER — Inpatient Hospital Stay: Attending: Hematology and Oncology | Admitting: Licensed Clinical Social Worker

## 2023-11-19 ENCOUNTER — Ambulatory Visit
Admission: RE | Admit: 2023-11-19 | Discharge: 2023-11-19 | Disposition: A | Discharge status: Home / Self Care | Source: Ambulatory Visit | Attending: Radiation Oncology | Admitting: Radiation Oncology

## 2023-11-19 DIAGNOSIS — Z170 Estrogen receptor positive status [ER+]: Secondary | ICD-10-CM | POA: Diagnosis not present

## 2023-11-19 DIAGNOSIS — Z809 Family history of malignant neoplasm, unspecified: Secondary | ICD-10-CM | POA: Insufficient documentation

## 2023-11-19 DIAGNOSIS — C50212 Malignant neoplasm of upper-inner quadrant of left female breast: Secondary | ICD-10-CM | POA: Diagnosis not present

## 2023-11-19 DIAGNOSIS — Z803 Family history of malignant neoplasm of breast: Secondary | ICD-10-CM | POA: Insufficient documentation

## 2023-11-19 DIAGNOSIS — Z923 Personal history of irradiation: Secondary | ICD-10-CM | POA: Insufficient documentation

## 2023-11-19 DIAGNOSIS — C8309 Small cell B-cell lymphoma, extranodal and solid organ sites: Secondary | ICD-10-CM | POA: Insufficient documentation

## 2023-11-19 DIAGNOSIS — Z510 Encounter for antineoplastic radiation therapy: Secondary | ICD-10-CM | POA: Diagnosis not present

## 2023-11-19 LAB — RAD ONC ARIA SESSION SUMMARY
Course Elapsed Days: 20
Plan Fractions Treated to Date: 14
Plan Prescribed Dose Per Fraction: 2.66 Gy
Plan Total Fractions Prescribed: 16
Plan Total Prescribed Dose: 42.56 Gy
Reference Point Dosage Given to Date: 37.24 Gy
Reference Point Session Dosage Given: 2.66 Gy
Session Number: 14

## 2023-11-19 NOTE — Progress Notes (Signed)
 Pt no-showed appt.  Pt called CSW at 11:32am- she forgot about the appt this morning. Reports she is doing well emotionally. Declined reschedule at this time. Pt will call if she would like another appointment.   Alea Ryer E Tove Wideman, LCSW

## 2023-11-20 ENCOUNTER — Ambulatory Visit

## 2023-11-20 ENCOUNTER — Other Ambulatory Visit: Payer: Self-pay

## 2023-11-20 ENCOUNTER — Ambulatory Visit
Admission: RE | Admit: 2023-11-20 | Discharge: 2023-11-20 | Disposition: A | Discharge status: Home / Self Care | Source: Ambulatory Visit | Attending: Radiation Oncology | Admitting: Radiation Oncology

## 2023-11-20 DIAGNOSIS — Z510 Encounter for antineoplastic radiation therapy: Secondary | ICD-10-CM | POA: Diagnosis not present

## 2023-11-20 DIAGNOSIS — C50212 Malignant neoplasm of upper-inner quadrant of left female breast: Secondary | ICD-10-CM | POA: Diagnosis not present

## 2023-11-20 DIAGNOSIS — Z170 Estrogen receptor positive status [ER+]: Secondary | ICD-10-CM | POA: Diagnosis not present

## 2023-11-20 LAB — RAD ONC ARIA SESSION SUMMARY
Course Elapsed Days: 21
Plan Fractions Treated to Date: 15
Plan Prescribed Dose Per Fraction: 2.66 Gy
Plan Total Fractions Prescribed: 16
Plan Total Prescribed Dose: 42.56 Gy
Reference Point Dosage Given to Date: 39.9 Gy
Reference Point Session Dosage Given: 2.66 Gy
Session Number: 15

## 2023-11-21 ENCOUNTER — Ambulatory Visit
Admission: RE | Admit: 2023-11-21 | Discharge: 2023-11-21 | Disposition: A | Discharge status: Home / Self Care | Source: Ambulatory Visit | Attending: Radiation Oncology | Admitting: Radiation Oncology

## 2023-11-21 ENCOUNTER — Other Ambulatory Visit: Payer: Self-pay

## 2023-11-21 DIAGNOSIS — Z170 Estrogen receptor positive status [ER+]: Secondary | ICD-10-CM | POA: Diagnosis not present

## 2023-11-21 DIAGNOSIS — Z510 Encounter for antineoplastic radiation therapy: Secondary | ICD-10-CM | POA: Diagnosis not present

## 2023-11-21 DIAGNOSIS — C50212 Malignant neoplasm of upper-inner quadrant of left female breast: Secondary | ICD-10-CM | POA: Diagnosis not present

## 2023-11-21 LAB — RAD ONC ARIA SESSION SUMMARY
Course Elapsed Days: 22
Plan Fractions Treated to Date: 16
Plan Prescribed Dose Per Fraction: 2.66 Gy
Plan Total Fractions Prescribed: 16
Plan Total Prescribed Dose: 42.56 Gy
Reference Point Dosage Given to Date: 42.56 Gy
Reference Point Session Dosage Given: 2.66 Gy
Session Number: 16

## 2023-11-22 ENCOUNTER — Ambulatory Visit
Admission: RE | Admit: 2023-11-22 | Discharge: 2023-11-22 | Disposition: A | Discharge status: Home / Self Care | Source: Ambulatory Visit | Attending: Radiation Oncology

## 2023-11-22 ENCOUNTER — Other Ambulatory Visit: Payer: Self-pay

## 2023-11-22 ENCOUNTER — Ambulatory Visit
Admission: RE | Admit: 2023-11-22 | Discharge: 2023-11-22 | Disposition: A | Discharge status: Home / Self Care | Source: Ambulatory Visit | Attending: Radiation Oncology | Admitting: Radiation Oncology

## 2023-11-22 DIAGNOSIS — C50212 Malignant neoplasm of upper-inner quadrant of left female breast: Secondary | ICD-10-CM | POA: Diagnosis not present

## 2023-11-22 DIAGNOSIS — Z510 Encounter for antineoplastic radiation therapy: Secondary | ICD-10-CM | POA: Diagnosis not present

## 2023-11-22 DIAGNOSIS — Z170 Estrogen receptor positive status [ER+]: Secondary | ICD-10-CM | POA: Diagnosis not present

## 2023-11-22 LAB — RAD ONC ARIA SESSION SUMMARY
Course Elapsed Days: 23
Plan Fractions Treated to Date: 1
Plan Prescribed Dose Per Fraction: 2 Gy
Plan Total Fractions Prescribed: 4
Plan Total Prescribed Dose: 8 Gy
Reference Point Dosage Given to Date: 2 Gy
Reference Point Session Dosage Given: 2 Gy
Session Number: 17

## 2023-11-25 ENCOUNTER — Other Ambulatory Visit: Payer: Self-pay

## 2023-11-25 ENCOUNTER — Ambulatory Visit
Admission: RE | Admit: 2023-11-25 | Discharge: 2023-11-25 | Disposition: A | Discharge status: Home / Self Care | Source: Ambulatory Visit | Attending: Radiation Oncology | Admitting: Radiation Oncology

## 2023-11-25 DIAGNOSIS — Z170 Estrogen receptor positive status [ER+]: Secondary | ICD-10-CM | POA: Diagnosis not present

## 2023-11-25 DIAGNOSIS — C50212 Malignant neoplasm of upper-inner quadrant of left female breast: Secondary | ICD-10-CM | POA: Diagnosis not present

## 2023-11-25 DIAGNOSIS — Z510 Encounter for antineoplastic radiation therapy: Secondary | ICD-10-CM | POA: Diagnosis not present

## 2023-11-25 LAB — RAD ONC ARIA SESSION SUMMARY
Course Elapsed Days: 26
Plan Fractions Treated to Date: 2
Plan Prescribed Dose Per Fraction: 2 Gy
Plan Total Fractions Prescribed: 4
Plan Total Prescribed Dose: 8 Gy
Reference Point Dosage Given to Date: 4 Gy
Reference Point Session Dosage Given: 2 Gy
Session Number: 18

## 2023-11-26 ENCOUNTER — Ambulatory Visit
Admission: RE | Admit: 2023-11-26 | Discharge: 2023-11-26 | Disposition: A | Discharge status: Home / Self Care | Source: Ambulatory Visit | Attending: Radiation Oncology

## 2023-11-26 ENCOUNTER — Other Ambulatory Visit: Payer: Self-pay

## 2023-11-26 DIAGNOSIS — C50212 Malignant neoplasm of upper-inner quadrant of left female breast: Secondary | ICD-10-CM | POA: Diagnosis not present

## 2023-11-26 DIAGNOSIS — Z510 Encounter for antineoplastic radiation therapy: Secondary | ICD-10-CM | POA: Diagnosis not present

## 2023-11-26 DIAGNOSIS — Z170 Estrogen receptor positive status [ER+]: Secondary | ICD-10-CM | POA: Diagnosis not present

## 2023-11-26 LAB — RAD ONC ARIA SESSION SUMMARY
Course Elapsed Days: 27
Plan Fractions Treated to Date: 3
Plan Prescribed Dose Per Fraction: 2 Gy
Plan Total Fractions Prescribed: 4
Plan Total Prescribed Dose: 8 Gy
Reference Point Dosage Given to Date: 6 Gy
Reference Point Session Dosage Given: 2 Gy
Session Number: 19

## 2023-11-27 ENCOUNTER — Other Ambulatory Visit: Payer: Self-pay

## 2023-11-27 ENCOUNTER — Ambulatory Visit
Admission: RE | Admit: 2023-11-27 | Discharge: 2023-11-27 | Disposition: A | Discharge status: Home / Self Care | Source: Ambulatory Visit | Attending: Radiation Oncology

## 2023-11-27 DIAGNOSIS — Z510 Encounter for antineoplastic radiation therapy: Secondary | ICD-10-CM | POA: Diagnosis not present

## 2023-11-27 DIAGNOSIS — C50212 Malignant neoplasm of upper-inner quadrant of left female breast: Secondary | ICD-10-CM | POA: Diagnosis not present

## 2023-11-27 DIAGNOSIS — Z170 Estrogen receptor positive status [ER+]: Secondary | ICD-10-CM | POA: Diagnosis not present

## 2023-11-27 LAB — RAD ONC ARIA SESSION SUMMARY
Course Elapsed Days: 28
Plan Fractions Treated to Date: 4
Plan Prescribed Dose Per Fraction: 2 Gy
Plan Total Fractions Prescribed: 4
Plan Total Prescribed Dose: 8 Gy
Reference Point Dosage Given to Date: 8 Gy
Reference Point Session Dosage Given: 2 Gy
Session Number: 20

## 2023-11-28 ENCOUNTER — Other Ambulatory Visit: Payer: Self-pay | Admitting: Internal Medicine

## 2023-11-28 DIAGNOSIS — K219 Gastro-esophageal reflux disease without esophagitis: Secondary | ICD-10-CM

## 2023-11-28 NOTE — Radiation Completion Notes (Addendum)
  Radiation Oncology         [phone] ________________________________  Name: Kayla Lowe  MRN: [id number]  Date of Service: 11/27/2023  DOB: 04-Jan-1956  End of Treatment Note  Diagnosis: Stage IB (pT2, pN0, M0) high grade invasive ductal carcinoma, ER/PR+, HER2-   Intent: Curative     ==========DELIVERED PLANS==========  First Treatment Date: 2023-10-30 Last Treatment Date: 2023-11-27   Plan Name: Breast_L Site: Breast, Left Technique: 3D Mode: Photon Dose Per Fraction: 2.66 Gy Prescribed Dose (Delivered / Prescribed): 42.56 Gy / 42.56 Gy Prescribed Fxs (Delivered / Prescribed): 16 / 16   Plan Name: Breast_L_Bst Site: Breast, Left Technique: Electron Mode: Electron Dose Per Fraction: 2 Gy Prescribed Dose (Delivered / Prescribed): 8 Gy / 8 Gy Prescribed Fxs (Delivered / Prescribed): 4 / 4     ==========ON TREATMENT VISIT DATES========== 2023-11-01, 2023-11-08, 2023-11-14, 2023-11-22    See weekly On Treatment Notes in Epic for details in the Media tab (listed as Progress notes on the On Treatment Visit Dates listed above). The patient tolerated radiation. She developed fatigue and anticipated skin changes in the treatment field.   The patient will receive a call in about one month from the radiation oncology department. She will continue follow up with Dr. Loretha as well.      Donald KYM Husband, PAC

## 2023-12-02 NOTE — Telephone Encounter (Signed)
 Patient comfirmed

## 2023-12-03 ENCOUNTER — Inpatient Hospital Stay (HOSPITAL_BASED_OUTPATIENT_CLINIC_OR_DEPARTMENT_OTHER): Admitting: Hematology and Oncology

## 2023-12-03 ENCOUNTER — Inpatient Hospital Stay

## 2023-12-03 VITALS — BP 129/67 | HR 87 | Temp 97.9°F | Resp 17 | Wt 202.5 lb

## 2023-12-03 DIAGNOSIS — Z170 Estrogen receptor positive status [ER+]: Secondary | ICD-10-CM

## 2023-12-03 DIAGNOSIS — C50212 Malignant neoplasm of upper-inner quadrant of left female breast: Secondary | ICD-10-CM

## 2023-12-03 DIAGNOSIS — C8309 Small cell B-cell lymphoma, extranodal and solid organ sites: Secondary | ICD-10-CM | POA: Diagnosis not present

## 2023-12-03 DIAGNOSIS — Z923 Personal history of irradiation: Secondary | ICD-10-CM | POA: Diagnosis not present

## 2023-12-03 DIAGNOSIS — Z803 Family history of malignant neoplasm of breast: Secondary | ICD-10-CM | POA: Diagnosis not present

## 2023-12-03 DIAGNOSIS — Z809 Family history of malignant neoplasm, unspecified: Secondary | ICD-10-CM | POA: Diagnosis not present

## 2023-12-03 LAB — CMP (CANCER CENTER ONLY)
ALT: 13 U/L (ref 0–44)
AST: 16 U/L (ref 15–41)
Albumin: 4.2 g/dL (ref 3.5–5.0)
Alkaline Phosphatase: 85 U/L (ref 38–126)
Anion gap: 7 (ref 5–15)
BUN: 19 mg/dL (ref 8–23)
CO2: 26 mmol/L (ref 22–32)
Calcium: 10.7 mg/dL — ABNORMAL HIGH (ref 8.9–10.3)
Chloride: 107 mmol/L (ref 98–111)
Creatinine: 1.04 mg/dL — ABNORMAL HIGH (ref 0.44–1.00)
GFR, Estimated: 58 mL/min — ABNORMAL LOW (ref >60)
Glucose, Bld: 101 mg/dL — ABNORMAL HIGH (ref 70–99)
Potassium: 3.9 mmol/L (ref 3.5–5.1)
Sodium: 140 mmol/L (ref 135–145)
Total Bilirubin: 0.4 mg/dL (ref 0.0–1.2)
Total Protein: 8 g/dL (ref 6.5–8.1)

## 2023-12-03 LAB — CBC WITH DIFFERENTIAL/PLATELET
Abs Immature Granulocytes: 0.01 K/uL (ref 0.00–0.07)
Basophils Absolute: 0 K/uL (ref 0.0–0.1)
Basophils Relative: 0 %
Eosinophils Absolute: 0.1 K/uL (ref 0.0–0.5)
Eosinophils Relative: 3 %
HCT: 41.7 % (ref 36.0–46.0)
Hemoglobin: 13.4 g/dL (ref 12.0–15.0)
Immature Granulocytes: 0 %
Lymphocytes Relative: 16 %
Lymphs Abs: 0.8 K/uL (ref 0.7–4.0)
MCH: 31.2 pg (ref 26.0–34.0)
MCHC: 32.1 g/dL (ref 30.0–36.0)
MCV: 97 fL (ref 80.0–100.0)
Monocytes Absolute: 0.6 K/uL (ref 0.1–1.0)
Monocytes Relative: 12 %
Neutro Abs: 3.4 K/uL (ref 1.7–7.7)
Neutrophils Relative %: 69 %
Platelets: 284 K/uL (ref 150–400)
RBC: 4.3 MIL/uL (ref 3.87–5.11)
RDW: 13.4 % (ref 11.5–15.5)
WBC: 5 K/uL (ref 4.0–10.5)
nRBC: 0 % (ref 0.0–0.2)

## 2023-12-03 LAB — LACTATE DEHYDROGENASE: LDH: 142 U/L (ref 98–192)

## 2023-12-03 MED ORDER — ANASTROZOLE 1 MG PO TABS: 1.0000 mg | ORAL_TABLET | Freq: Every day | ORAL | 3 refills | Status: DC

## 2023-12-03 NOTE — Progress Notes (Signed)
 [name] FOLLOW UP NOTE  Patient Care Team: [name], MD as PCP - General (Internal Medicine) Harrietta Kurtz, OD as Consulting Physician (Optometry) Loretha Ash, MD as Consulting Physician (Hematology and Oncology) Glean Stephane BROCKS, RN (Inactive) as Oncology Nurse Navigator Vernetta Berg, MD as Consulting Physician (General Surgery) Tyree Nanetta SAILOR, RN as Oncology Nurse Navigator Merceda Lela SAUNDERS, RPH (Pharmacist) [facility], [facility]  CHIEF COMPLAINTS/PURPOSE OF CONSULTATION:  Marginal zone lymphoma and Newly diagnosed [facility]  ASSESSMENT & PLAN:   This is a very 68 yr old female pt with new diagnosis of small bowel marginal zone lymphoma here for hospital follow up.  Breast Cancer Two small areas in the left breast at 10 o'clock position, one measuring 1 cm and the other 7 mm. Both biopsied and confirmed as breast cancer. Estrogen and progesterone positive, HER2 status negative.  -She is s/p surgery, neg margins, neg LN -Oncotype score of 28 indicates chemotherapy benefits. -Recommended adj chemotherapy,  - She however wanted to do breast reconstruction and by the time she was ready for chemo, she was almost 10 weeks from surgery. We once again discussed oncotype Dx score, benefit from chemotherapy and since she is outside the treatment window, we agreed to omit chemotherapy. She is now post adj radiation Discussed using aromatase inhibitors for anti estrogen therapy. We have reviewed mechanism of action, adverse effects of anti estrogen therapy including but not limited to fatigue, post menopausal symptoms such as hot flashes, vaginal dryness, bone density loss.  Marginal Zone Lymphoma  She originally presented to the emergency room in September with pain in the left upper quadrant and was found to have small bowel obstruction. She had CT abdomen pelvis which showed small bowel obstruction with transition point in the central small bowel mesentery  suspicious for adhesion. She had ex lap with small bowel resection and this showed a large small bowel mass which was not obstructing the small bowel lumen. 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 Since her small bowel surgery, she remains asymptomatic, wound has nearly healed. Dec PET/CT with no signs of tracer avid lymphadenopathy, postop changes noted from the previous small bowel resection, increased uptake along the resection site is nonspecific could be physiological or postsurgical, intense uptake is identified localizing to the descending duodenum where there is circumferential wall thickening identified cannot exclude Endoscopy showed duodenal and gastric biopsies showed Marginal zone lymphoma. She is asymptomatic from this, we will continue to monitor with labs, H and P every 3 months.  Gynecological Concern Activity noted in the vagina on PET scan, likely unrelated to lymphoma or breast cancer.  Vaginal polypectomy benign.   HISTORY OF PRESENTING ILLNESS:  Kayla Lowe  68 y.o. female is here because of marginal zone lymphoma of small bowel.  Oncology History  Malignant neoplasm of upper-inner quadrant of left breast in female, estrogen receptor positive (HCC)  06/14/2023 Initial Diagnosis   Malignant neoplasm of upper-inner quadrant of left breast in female, estrogen receptor positive (HCC)   06/14/2023 Cancer Staging   Staging form: Breast, AJCC 8th Edition - Clinical stage from 06/14/2023: Stage IA (cT1, cN0, cM0, G3, ER+, PR+, HER2-) - Signed by Loretha Ash, MD on 06/17/2023 Stage prefix: Initial diagnosis Method of lymph node assessment: Clinical Histologic grading system: 3 grade system   09/02/2023 -  Chemotherapy   Patient is on  Treatment Plan : BREAST TC q21d     09/11/2023  Cancer Staging   Staging form: Breast, AJCC 8th Edition - Pathologic stage from 09/11/2023: Stage IB (pT2, pN0, cM0, G3, ER+, PR+, HER2-) - Signed by Wyatt Leeroy HERO, PA-C on 09/11/2023 Stage prefix: Initial diagnosis Histologic grading system: 3 grade system     History of Present Illness   Discussed the use of AI scribe software for clinical note transcription with the patient, who gave verbal consent to proceed.  History of Present Illness Kayla Lowe  is a 68 year old female with breast cancer and marginal zone lymphoma who presents for  follow-up.  Kayla Lowe  is a 68 year old female with breast cancer and marginal zone lymphoma who presents for follow-up after breast surgery and radiation therapy.  She has recently completed breast surgery and radiation therapy for breast cancer. She is considering starting antiestrogen therapy, specifically anastrozole .  She has a history of non-Hodgkin's marginal zone lymphoma, identified through biopsies during an endoscopy of her stomach and duodenum.  In terms of her general health, she reports frequent sweating, stable appetite, and no significant weight changes, although she notes an increase due to protein intake. She experiences occasional constipation, for which she takes Miralax  every other day, but she does not drink water regularly. Her bowel movements are otherwise normal, and she has no trouble urinating.  Rest of the pertinent 10 point ROS reviewed and neg.  MEDICAL HISTORY:  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   Osteoarthritis     SURGICAL HISTORY: Past Surgical History:  Procedure Laterality Date   ABDOMINAL HYSTERECTOMY  1987   BREAST BIOPSY  Left 05/28/2023   US  LT BREAST BX W LOC DEV EA ADD LESION IMG BX SPEC US  GUIDE 05/28/2023 [facility]   BREAST BIOPSY Left 05/28/2023   US  LT BREAST BX W LOC DEV 1ST LESION IMG BX SPEC US  GUIDE 05/28/2023 [facility]   BREAST BIOPSY Left 07/02/2023   US  LT RADIOACTIVE SEED EA ADD LESION 07/02/2023 [facility]   BREAST BIOPSY Left 07/02/2023   US  LT RADIOACTIVE SEED LOC 07/02/2023 [facility]   BREAST LUMPECTOMY WITH RADIOACTIVE SEED AND SENTINEL LYMPH NODE BIOPSY Left 07/03/2023   Procedure: RADIOACTIVE SEED GUIDED LEFT BREAST LUMPECTOMY x2 AND LEFT SENTINEL NODE BIOPSY;  Surgeon: Vernetta Berg, MD;  Location: [facility];  Service: General;  Laterality: Left;  LMA   BREAST REDUCTION SURGERY Bilateral 08/16/2023   Procedure: MAMMOPLASTY, REDUCTION;  Surgeon: Waddell Leonce NOVAK, MD;  Location: [facility];  Service: Plastics;  Laterality: Bilateral;   LAPAROTOMY N/A 02/04/2023   Procedure: EXPLORATORY LAPAROTOMY, WITH SMALL BOWEL RESECTION;  Surgeon: Vernetta Berg, MD;  Location: [facility];  Service: General;  Laterality: N/A;    SOCIAL HISTORY: Social History   Socioeconomic History   Marital status: Legally Separated    Spouse name: Not on file   Number of children: 3   Years of education: Not on file   Highest education level: Some college, no degree  Occupational History   Occupation: RETIRED/nanny  Tobacco Use   Smoking status: Never   Smokeless tobacco: Never  Vaping Use   Vaping status:  Never Used  Substance and Sexual Activity   Alcohol use: No   Drug use: No   Sexual activity: Not Currently    Birth control/protection: Surgical    Comment: Hyst  Other Topics Concern   Not on file  Social History Narrative   Lives with a roomate   Social Drivers of Health   Financial Resource Strain: Low Risk  (09/12/2023)   Overall Financial Resource Strain (CARDIA)    Difficulty of Paying Living Expenses: Not very hard  Recent Concern: Financial  Resource Strain - Medium Risk (08/07/2023)   Overall Financial Resource Strain (CARDIA)    Difficulty of Paying Living Expenses: Somewhat hard  Food Insecurity: No Food Insecurity (09/12/2023)   Hunger Vital Sign    Worried About Running Out of Food in the Last Year: Never true    Ran Out of Food in the Last Year: Never true  Recent Concern: Food Insecurity - Food Insecurity Present (06/14/2023)   Hunger Vital Sign    Worried About Running Out of Food in the Last Year: Sometimes true    Ran Out of Food in the Last Year: Sometimes true  Transportation Needs: No Transportation Needs (09/12/2023)   PRAPARE - Administrator, Civil Service (Medical): No    Lack of Transportation (Non-Medical): No  Physical Activity: Insufficiently Active (09/12/2023)   Exercise Vital Sign    Days of Exercise per Week: 3 days    Minutes of Exercise per Session: 20 min  Stress: Stress Concern Present (09/12/2023)   [employer] of Occupational Health - Occupational Stress Questionnaire    Feeling of Stress : To some extent  Social Connections: Moderately Integrated (09/12/2023)   Social Connection and Isolation Panel    Frequency of Communication with Friends and Family: More than three times a week    Frequency of Social Gatherings with Friends and Family: Once a week    Attends Religious Services: More than 4 times per year    Active Member of [employer] or Organizations: Yes    Attends Banker Meetings: More than 4 times per year    Marital Status: Separated  Intimate Partner Violence: Patient Unable To Answer (08/07/2023)   Humiliation, Afraid, Rape, and Kick questionnaire    Fear of Current or Ex-Partner: Patient unable to answer    Emotionally Abused: Patient unable to answer    Physically Abused: Patient unable to answer    Sexually Abused: Patient unable to answer    FAMILY HISTORY: Family History  Problem Relation Age of Onset   Cancer Father    Hypertension Brother    Diabetes  Brother    Diabetes Paternal Grandfather    Diabetes Paternal Uncle    Breast cancer Paternal Aunt    Colon cancer Neg Hx    Hypercalcemia Neg Hx     ALLERGIES:  is allergic to cozaar  [losartan  potassium], lisinopril , and statins.  MEDICATIONS:  Current Outpatient Medications  Medication Sig Dispense Refill   anastrozole  (ARIMIDEX ) 1 MG tablet Take 1 tablet (1 mg total) by mouth daily. 90 tablet 3   acetaminophen  (TYLENOL ) 500 MG tablet Take 2 tablets (1,000 mg total) by mouth 4 (four) times daily. 30 tablet 0   aspirin  EC 81 MG tablet Take 81 mg by mouth daily. Swallow whole.     carvedilol  (COREG ) 6.25 MG tablet TAKE 1 TABLET(6.25 MG) BY MOUTH TWICE DAILY WITH A MEAL 180 tablet 1   Cholecalciferol  (VITAMIN D ) 50 MCG (2000 UT) CAPS Take  1 capsule by mouth daily.     ezetimibe  (ZETIA ) 10 MG tablet TAKE 1 TABLET(10 MG) BY MOUTH DAILY 90 tablet 0   famotidine  (PEPCID ) 40 MG tablet TAKE 1 TABLET(40 MG) BY MOUTH AT BEDTIME 90 tablet 1   ferrous sulfate  325 (65 FE) MG tablet Take 1 tablet (325 mg total) by mouth daily with breakfast. 90 tablet 1   fluticasone  (FLONASE ) 50 MCG/ACT nasal spray SHAKE LIQUID AND USE 2 SPRAYS IN EACH NOSTRIL DAILY 48 g 1   hydrALAZINE  (APRESOLINE ) 25 MG tablet TAKE 1 TABLET(25 MG) BY MOUTH THREE TIMES DAILY 270 tablet 0   Loratadine  (CLARITIN  PO) Take by mouth daily.     montelukast  (SINGULAIR ) 10 MG tablet TAKE 1 TABLET BY MOUTH EVERY NIGHT AT BEDTIME 90 tablet 0   pantoprazole  (PROTONIX ) 20 MG tablet TAKE 1 TABLET(20 MG) BY MOUTH DAILY 90 tablet 1   pravastatin  (PRAVACHOL ) 40 MG tablet TAKE 1 TABLET(40 MG) BY MOUTH DAILY 90 tablet 1   tiZANidine  (ZANAFLEX ) 2 MG tablet TAKE 1 TABLET(2 MG) BY MOUTH EVERY 8 HOURS AS NEEDED FOR MUSCLE SPASMS 270 tablet 0   traMADol  (ULTRAM ) 50 MG tablet Take 1 tablet (50 mg total) by mouth every 6 (six) hours as needed. 270 tablet 0   triamterene -hydrochlorothiazide  (DYAZIDE) 37.5-25 MG capsule Take 1 each (1 capsule total) by mouth  daily. 90 capsule 0   No current facility-administered medications for this visit.      PHYSICAL EXAMINATION: ECOG PERFORMANCE STATUS: 2 - Symptomatic, <50% confined to bed  Vitals:   12/03/23 0901  BP: 129/67  Pulse: 87  Resp: 17  Temp: 97.9 F (36.6 C)  SpO2: 100%     Filed Weights   12/03/23 0901  Weight: 202 lb 8 oz (91.9 kg)   GENERAL:alert, no distress and comfortable Left breast and axillary wound healing well. No palpable adenopathy Chest: CTA bilaterally Heart: RRR No LE edema  LABORATORY DATA:  I have reviewed the data as listed Lab Results  Component Value Date   WBC 5.0 12/03/2023   HGB 13.4 12/03/2023   HCT 41.7 12/03/2023   MCV 97.0 12/03/2023   PLT 284 12/03/2023     Chemistry      Component Value Date/Time   NA 139 08/13/2023 1530   NA 145 (H) 07/29/2018 1112   K 4.2 08/13/2023 1530   CL 106 08/13/2023 1530   CO2 25 08/13/2023 1530   BUN 11 08/13/2023 1530   BUN 8 07/29/2018 1112   CREATININE 1.09 (H) 08/13/2023 1530   CREATININE 0.85 04/18/2023 1039   CREATININE 0.87 08/03/2015 0942      Component Value Date/Time   CALCIUM  10.7 (H) 08/13/2023 1530   ALKPHOS 100 04/18/2023 1039   AST 11 (L) 04/18/2023 1039   ALT 7 04/18/2023 1039   BILITOT 0.3 04/18/2023 1039       RADIOGRAPHIC STUDIES: I have personally reviewed the radiological images as listed and agreed with the findings in the report. No results found.   All questions were answered. The patient knows to call the clinic with any problems, questions or concerns.     Amber Stalls, MD 12/03/2023 9:49 AM

## 2023-12-11 ENCOUNTER — Other Ambulatory Visit: Payer: Self-pay | Admitting: Internal Medicine

## 2023-12-11 DIAGNOSIS — E785 Hyperlipidemia, unspecified: Secondary | ICD-10-CM

## 2023-12-11 DIAGNOSIS — R252 Cramp and spasm: Secondary | ICD-10-CM

## 2023-12-11 DIAGNOSIS — G8929 Other chronic pain: Secondary | ICD-10-CM

## 2023-12-14 ENCOUNTER — Other Ambulatory Visit: Payer: Self-pay | Admitting: Internal Medicine

## 2023-12-14 DIAGNOSIS — I119 Hypertensive heart disease without heart failure: Secondary | ICD-10-CM

## 2023-12-14 DIAGNOSIS — I1A0 Resistant hypertension: Secondary | ICD-10-CM

## 2023-12-16 ENCOUNTER — Encounter: Payer: Self-pay | Admitting: Internal Medicine

## 2023-12-17 ENCOUNTER — Telehealth: Payer: Self-pay | Admitting: Pharmacy Technician

## 2023-12-17 ENCOUNTER — Encounter: Payer: Self-pay | Admitting: Hematology and Oncology

## 2023-12-17 ENCOUNTER — Encounter: Payer: Self-pay | Admitting: Internal Medicine

## 2023-12-17 ENCOUNTER — Other Ambulatory Visit (HOSPITAL_COMMUNITY): Payer: Self-pay

## 2023-12-17 NOTE — Telephone Encounter (Signed)
 Copied from CRM [id number]. Topic: Clinical - Medication Prior Auth >> Dec 17, 2023  8:15 AM Pinkey ORN wrote: Reason for CRM: montelukast  (SINGULAIR ) 10 MG tablet >> Dec 17, 2023  8:16 AM Pinkey ORN wrote: Patient states her insurance changed to [employer] and her montelukast  (SINGULAIR ) 10 MG tablet needs a prior authorization in order to pick up this medication.

## 2023-12-17 NOTE — Telephone Encounter (Signed)
 PA request has been Received. New Encounter has been or will be created for follow up. For additional info see Pharmacy Prior Auth telephone encounter from 12/17/23.

## 2023-12-17 NOTE — Telephone Encounter (Signed)
 Pharmacy Patient Advocate Encounter   Received notification from Patient Advice Request messages that prior authorization for Montelukast  is required/requested.  Insurance verification completed.   The patient is insured through [employer] .   Per test claim: The current 90 day co-pay is, $0.  No PA needed at this time. This test claim was processed through [facility]- copay amounts may vary at other pharmacies due to pharmacy/plan contracts, or as the patient moves through the different stages of their insurance plan.

## 2023-12-17 NOTE — Telephone Encounter (Signed)
 Last OV 09/16/23 Next OV 03/18/24  Last refill 09/20/23 Qty #90/0

## 2023-12-17 NOTE — Telephone Encounter (Signed)
 Called the pharmacy to give them the new ins info and have them reprocess. Their computer was being slow but said if it processed on through the pt should be notified as usual when it's ready. If the ins still didn't process she should also be notified.

## 2023-12-19 NOTE — Telephone Encounter (Signed)
 NOTED

## 2023-12-20 ENCOUNTER — Encounter: Payer: Self-pay | Admitting: Hematology and Oncology

## 2023-12-20 ENCOUNTER — Encounter: Payer: Self-pay | Admitting: Internal Medicine

## 2023-12-20 ENCOUNTER — Other Ambulatory Visit: Payer: Self-pay | Admitting: Surgery

## 2023-12-20 ENCOUNTER — Other Ambulatory Visit: Payer: Self-pay | Admitting: General Surgery

## 2023-12-20 DIAGNOSIS — C50912 Malignant neoplasm of unspecified site of left female breast: Secondary | ICD-10-CM | POA: Diagnosis not present

## 2023-12-20 DIAGNOSIS — M6208 Separation of muscle (nontraumatic), other site: Secondary | ICD-10-CM | POA: Diagnosis not present

## 2023-12-20 DIAGNOSIS — K432 Incisional hernia without obstruction or gangrene: Secondary | ICD-10-CM

## 2023-12-20 DIAGNOSIS — C8599 Non-Hodgkin lymphoma, unspecified, extranodal and solid organ sites: Secondary | ICD-10-CM

## 2023-12-20 NOTE — Progress Notes (Signed)
 REFERRING PHYSICIAN:  Self  PROVIDER:  LYNDA LEOS, MD  MRN: [id number] DOB: 20-Oct-1955 DATE OF ENCOUNTER: 12/20/2023  Subjective   Chief Complaint: Follow-up (Hernia )     History of Present Illness: Kayla Lowe  is a 68 y.o. female who is seen today as an office consultation at the request of Dr. Arch for evaluation of Follow-up (Hernia ) .   History of Present Illness Kayla Lowe  is a 68 year old female with a history of lymphoma who presents with concerns about a possible hernia.  She underwent surgery in September 2024 for lymphoma with a vertical incision, followed by an infection that required reopening of the incision. Since then, she has noticed a progressively enlarging abdominal bulge, suspected to be a hernia. She uses a binder to manage the bulge, which causes no pain unless pressure is applied. No imaging studies have been conducted to evaluate the bulge, and she has not received abdominal radiation therapy. Her surgical history includes no hysterectomy or other abdominal surgeries aside from the lymphoma surgery.     Review of Systems: A complete review of systems was obtained from the patient.  I have reviewed this information and discussed as appropriate with the patient.  See HPI as well for other ROS.  Review of Systems  Constitutional:  Negative for fever.  HENT:  Negative for congestion.   Eyes:  Negative for blurred vision.  Respiratory:  Negative for cough, shortness of breath and wheezing.   Cardiovascular:  Negative for chest pain and palpitations.  Gastrointestinal:  Negative for heartburn.  Genitourinary:  Negative for dysuria.  Musculoskeletal:  Negative for myalgias.  Skin:  Negative for rash.  Neurological:  Negative for dizziness and headaches.  Psychiatric/Behavioral:  Negative for depression and suicidal ideas.   All other systems reviewed and are negative.     Medical History: Past Medical History:  Diagnosis  Date  . Arthritis   . Chronic kidney disease   . GERD (gastroesophageal reflux disease)   . Hypertension     There is no problem list on file for this patient.   Past Surgical History:  Procedure Laterality Date  . EXPLORATORY LAPAROTOMY, WITH SMALL BOWEL RESECTION  02/04/2023   Dr. CHARM Poli  . LT BR RSL(BCG) LUMP x2 & LT SLNB  07/03/2023   Dr Poli  . HYSTERECTOMY       Allergies  Allergen Reactions  . Losartan  Potassium Other (See Comments)    Caused cough and sweats. Tingling in legs.  . Statins-Hmg-Coa Reductase Inhibitors Other (See Comments)    myalgias  . Lisinopril  Cough    Current Outpatient Medications on File Prior to Visit  Medication Sig Dispense Refill  . acetaminophen  (TYLENOL ) 500 MG tablet Take by mouth    . anastrozole  (ARIMIDEX ) 1 mg tablet Take 1 mg by mouth once daily    . aspirin  81 MG EC tablet Take 81 mg by mouth once daily    . carvediloL  (COREG ) 6.25 MG tablet Take 6.25 mg by mouth 2 (two) times daily with meals    . cetirizine  (ZYRTEC ) 10 MG tablet Take 10 mg by mouth once daily    . cholecalciferol  (VITAMIN D3) 2,000 unit capsule Take 1 capsule by mouth once daily    . ezetimibe  (ZETIA ) 10 mg tablet Take 1 tablet by mouth once daily    . famotidine  (PEPCID ) 40 MG tablet Take 40 mg by mouth at bedtime    . ferrous sulfate  325 (65 FE)  MG tablet Take 325 mg by mouth daily with breakfast    . hydrALAZINE  (APRESOLINE ) 25 MG tablet Take 25 mg by mouth 3 (three) times daily    . montelukast  (SINGULAIR ) 10 mg tablet Take 10 mg by mouth at bedtime    . pantoprazole  (PROTONIX ) 20 MG DR tablet Take 20 mg by mouth once daily    . polyethylene glycol (MIRALAX ) packet Take 17 g by mouth once daily as needed    . pravastatin  (PRAVACHOL ) 40 MG tablet Take 40 mg by mouth once daily    . tiZANidine  (ZANAFLEX ) 2 MG tablet Take 2 mg by mouth    . traMADoL  (ULTRAM ) 50 mg tablet Take by mouth    . triamterene -hydroCHLOROthiazide  (DYAZIDE) 37.5-25 mg capsule  Take 1 capsule by mouth    . ACCRUFER  30 mg Cap TAKE ONE CAPSULE BY MOUTH IN THE MORNING AND AT BEDTIME    . amLODIPine  (NORVASC ) 10 MG tablet Take 10 mg by mouth once daily    . cyclobenzaprine  (FLEXERIL ) 10 MG tablet Take 10 mg by mouth 3 (three) times daily as needed    . ondansetron  (ZOFRAN ) 4 MG tablet Take 4 mg by mouth every 6 (six) hours as needed    . potassium chloride  (KLOR-CON  M20) 20 MEQ ER tablet Take 40 mg by mouth once daily     No current facility-administered medications on file prior to visit.    Family History  Problem Relation Age of Onset  . High blood pressure (Hypertension) Mother   . Stroke Father   . High blood pressure (Hypertension) Father   . Coronary Artery Disease (Blocked arteries around heart) Brother   . Diabetes Brother   . High blood pressure (Hypertension) Brother   . Diabetes Paternal Uncle   . Diabetes Paternal Grandfather      Social History   Tobacco Use  Smoking Status Former  . Types: Cigarettes  Smokeless Tobacco Never     Social History   Socioeconomic History  . Marital status: Legally Separated  Tobacco Use  . Smoking status: Former    Types: Cigarettes  . Smokeless tobacco: Never  Vaping Use  . Vaping status: Never Used  Substance and Sexual Activity  . Alcohol use: Not Currently  . Drug use: Never   Social Drivers of Corporate investment banker Strain: Low Risk  (09/12/2023)   Received from [facility]   Overall Financial Resource Strain (CARDIA)   . Difficulty of Paying Living Expenses: Not very hard  Recent Concern: Financial Resource Strain - Medium Risk (08/07/2023)   Received from [facility]   Overall Financial Resource Strain (CARDIA)   . Difficulty of Paying Living Expenses: Somewhat hard  Food Insecurity: No Food Insecurity (09/12/2023)   Received from [facility]   Hunger Vital Sign   . Within the past 12 months, you worried that your food would run out before you got the money to buy more.: Never true   .  Within the past 12 months, the food you bought just didn't last and you didn't have money to get more.: Never true  Recent Concern: Food Insecurity - Food Insecurity Present (06/14/2023)   Received from [facility]   Hunger Vital Sign   . Worried About Programme researcher, broadcasting/film/video in the Last Year: Sometimes true   . Ran Out of Food in the Last Year: Sometimes true  Transportation Needs: No Transportation Needs (09/12/2023)   Received from [facility] - Transportation   .  Lack of Transportation (Medical): No   . Lack of Transportation (Non-Medical): No  Physical Activity: Insufficiently Active (09/12/2023)   Received from [facility]   Exercise Vital Sign   . On average, how many days per week do you engage in moderate to strenuous exercise (like a brisk walk)?: 3 days   . On average, how many minutes do you engage in exercise at this level?: 20 min  Stress: Stress Concern Present (09/12/2023)   Received from [facility] - Occupational Stress Questionnaire   . Feeling of Stress : To some extent  Social Connections: Moderately Integrated (09/12/2023)   Received from [facility]   Social Connection and Isolation Panel   . In a typical week, how many times do you talk on the phone with family, friends, or neighbors?: More than three times a week   . How often do you get together with friends or relatives?: Once a week   . How often do you attend church or religious services?: More than 4 times per year   . Do you belong to any clubs or organizations such as church groups, unions, fraternal or athletic groups, or school groups?: Yes   . How often do you attend meetings of the clubs or organizations you belong to?: More than 4 times per year   . Are you married, widowed, divorced, separated, never married, or living with a partner?: Separated  Housing Stability: High Risk (08/07/2023)   Received from [facility] Stability Vital Sign   . Unable to  Pay for Housing in the Last Year: Yes   . Homeless in the Last Year: No    Objective:    Vitals:   12/20/23 0912  PainSc: 0-No pain    There is no height or weight on file to calculate BMI.  Physical Exam Constitutional:      Appearance: Normal appearance.  HENT:     Head: Normocephalic and atraumatic.     Mouth/Throat:     Mouth: Mucous membranes are moist.     Pharynx: Oropharynx is clear.  Eyes:     General: No scleral icterus.    Pupils: Pupils are equal, round, and reactive to light.  Cardiovascular:     Rate and Rhythm: Normal rate and regular rhythm.     Pulses: Normal pulses.     Heart sounds: No murmur heard.    No friction rub. No gallop.  Pulmonary:     Effort: Pulmonary effort is normal. No respiratory distress.     Breath sounds: Normal breath sounds. No stridor.  Abdominal:     General: Abdomen is flat.   Musculoskeletal:        General: No swelling.  Skin:    General: Skin is warm.  Neurological:     General: No focal deficit present.     Mental Status: She is alert and oriented to person, place, and time. Mental status is at baseline.  Psychiatric:        Mood and Affect: Mood normal.        Thought Content: Thought content normal.        Judgment: Judgment normal.       Assessment and Plan:  Diagnoses and all orders for this visit:  Incisional hernia, without obstruction or gangrene  Lymphoma of small bowel (CMS/HHS-HCC)  Invasive ductal carcinoma of breast, left (CMS/HHS-HCC)  Incisional hernia without obstruction or gangrene  Rectus diastasis  Kayla Lowe  is a 68 y.o. female   Assessment & Plan Abdominal wall bulge, possible incisional hernia versus diastasis recti Presents with an abdominal wall bulge at the previous incision site from lymphoma removal surgery in September 2024. The bulge is growing, with no pain unless pressure is applied. Differential includes incisional hernia or diastasis recti. Physical exam did  not reveal a definitive hernia or palpable defect. The bulge may be due to diastasis recti, involving stretching of abdominal wall muscles without a true hernia, which is not typically dangerous as it does not involve a defect through which intestines can protrude. - Order CT scan of the abdomen to evaluate the abdominal wall bulge. - Schedule follow-up appointment to discuss CT scan results and management options.       No follow-ups on file.  Lynda Leos, MD, [facility], [place] [facility]imally [facility]

## 2023-12-21 ENCOUNTER — Other Ambulatory Visit: Payer: Self-pay | Admitting: Internal Medicine

## 2023-12-21 DIAGNOSIS — I1A0 Resistant hypertension: Secondary | ICD-10-CM

## 2023-12-21 DIAGNOSIS — I119 Hypertensive heart disease without heart failure: Secondary | ICD-10-CM

## 2023-12-23 ENCOUNTER — Other Ambulatory Visit: Payer: Self-pay | Admitting: Internal Medicine

## 2023-12-23 DIAGNOSIS — R252 Cramp and spasm: Secondary | ICD-10-CM

## 2023-12-23 DIAGNOSIS — M545 Low back pain, unspecified: Secondary | ICD-10-CM

## 2023-12-23 DIAGNOSIS — E785 Hyperlipidemia, unspecified: Secondary | ICD-10-CM

## 2023-12-23 MED ORDER — EZETIMIBE 10 MG PO TABS: ORAL_TABLET | ORAL | 0 refills | Status: DC

## 2023-12-23 NOTE — Telephone Encounter (Signed)
 Copied from CRM [id number]. Topic: Clinical - Medication Refill >> Dec 23, 2023  8:14 AM Antwanette L wrote: Medication: tiZANidine  (ZANAFLEX ) 2 MG tablet and ezetimibe  (ZETIA ) 10 MG tablet  Has the patient contacted their pharmacy? Yes  This is the patient's preferred pharmacy:  [id number] [place], [place] - [address] AT [facility] [address] [place] [address] Phone: [phone] Fax: [phone]  Is this the correct pharmacy for this prescription? Yes   Has the prescription been filled recently? Yes. Last refill  on tiZANidine  was on 12/13/23 an ezetimibe  was on 10/18/23  Is the patient out of the medication? Yes  Has the patient been seen for an appointment in the last year OR does the patient have an upcoming appointment? Yes. Last ov with Dr. Joshua was on 09/16/23  Can we respond through MyChart? Yes  Agent: Please be advised that Rx refills may take up to 3 business days. We ask that you follow-up with your pharmacy.

## 2023-12-24 ENCOUNTER — Other Ambulatory Visit: Payer: Self-pay | Admitting: Internal Medicine

## 2023-12-24 ENCOUNTER — Encounter: Payer: Self-pay | Admitting: Internal Medicine

## 2023-12-24 DIAGNOSIS — I119 Hypertensive heart disease without heart failure: Secondary | ICD-10-CM

## 2023-12-24 DIAGNOSIS — I1A0 Resistant hypertension: Secondary | ICD-10-CM

## 2023-12-25 ENCOUNTER — Ambulatory Visit
Admission: RE | Admit: 2023-12-25 | Discharge: 2023-12-25 | Disposition: A | Discharge status: Home / Self Care | Source: Ambulatory Visit | Attending: General Surgery | Admitting: General Surgery

## 2023-12-25 DIAGNOSIS — K8020 Calculus of gallbladder without cholecystitis without obstruction: Secondary | ICD-10-CM | POA: Diagnosis not present

## 2023-12-25 DIAGNOSIS — C50912 Malignant neoplasm of unspecified site of left female breast: Secondary | ICD-10-CM

## 2023-12-25 DIAGNOSIS — K439 Ventral hernia without obstruction or gangrene: Secondary | ICD-10-CM | POA: Diagnosis not present

## 2023-12-25 DIAGNOSIS — K432 Incisional hernia without obstruction or gangrene: Secondary | ICD-10-CM

## 2023-12-25 DIAGNOSIS — C8599 Non-Hodgkin lymphoma, unspecified, extranodal and solid organ sites: Secondary | ICD-10-CM

## 2023-12-25 DIAGNOSIS — M6208 Separation of muscle (nontraumatic), other site: Secondary | ICD-10-CM

## 2023-12-26 ENCOUNTER — Telehealth: Payer: Self-pay | Admitting: Internal Medicine

## 2023-12-26 ENCOUNTER — Other Ambulatory Visit: Payer: Self-pay

## 2023-12-26 DIAGNOSIS — I119 Hypertensive heart disease without heart failure: Secondary | ICD-10-CM

## 2023-12-26 DIAGNOSIS — I1A0 Resistant hypertension: Secondary | ICD-10-CM

## 2023-12-26 MED ORDER — TRIAMTERENE-HCTZ 37.5-25 MG PO CAPS: 1.0000 | ORAL_CAPSULE | Freq: Every day | ORAL | 1 refills | Status: DC

## 2023-12-26 NOTE — Telephone Encounter (Signed)
 Copied from CRM #[id number]. Topic: General - Other >> Dec 26, 2023  8:20 AM Rosina BIRCH wrote: Reason for CRM: [facility] called stating they lost power and they did not get the prescription for triamterene -hydrochlorothiazide  (DYAZIDE) 37.5-25 MG capsule for the patient and they need it resent CB [phone]

## 2023-12-26 NOTE — Telephone Encounter (Signed)
 Copied from CRM #[id number]. Topic: Clinical - Prescription Issue >> Dec 26, 2023  8:20 AM Franky GRADE wrote: Reason for CRM: Patient is calling because [facility] pharmacy keeps informing the patient they are not receiving the prescription for triamterene -hydrochlorothiazide  (DYAZIDE) 37.5-25 MG capsule [[id number]], they advised patient to contact the office and see if we can call and give a verbal order.

## 2023-12-26 NOTE — Telephone Encounter (Signed)
 Medication has been resent. I did confirm with the pharmacy that they received it, LM on patients VM that her medicine should be ready for pickup sometime today

## 2023-12-27 NOTE — Progress Notes (Signed)
  Radiation Oncology         [phone] ________________________________  Name: Kayla Lowe  MRN: [id number]  Date of Service: 12/30/2023  DOB: 1955/09/27  Post Treatment Telephone Note  Diagnosis: Stage IB (pT2, pN0, M0) high grade invasive ductal carcinoma, ER/PR+, HER2-    The patient was available for call today.   Symptoms of fatigue have improved since completing therapy.  Symptoms of skin changes have improved since completing therapy.  The patient was encouraged to avoid sun exposure in the area of prior treatment for up to one year following radiation with either sunscreen or by the style of clothing worn in the sun.  The patient has scheduled follow up with her medical oncologist Dr. Loretha for ongoing surveillance, and was encouraged to call if she develops concerns or questions regarding radiation.    Called patient on 12/27/23 @ 145pm

## 2023-12-30 ENCOUNTER — Ambulatory Visit
Admission: RE | Admit: 2023-12-30 | Discharge: 2023-12-30 | Disposition: A | Discharge status: Home / Self Care | Source: Ambulatory Visit | Attending: Radiation Oncology | Admitting: Radiation Oncology

## 2023-12-30 ENCOUNTER — Other Ambulatory Visit: Payer: Self-pay | Admitting: Internal Medicine

## 2023-12-30 DIAGNOSIS — C50212 Malignant neoplasm of upper-inner quadrant of left female breast: Secondary | ICD-10-CM

## 2023-12-30 DIAGNOSIS — J301 Allergic rhinitis due to pollen: Secondary | ICD-10-CM

## 2024-01-17 ENCOUNTER — Ambulatory Visit: Payer: Self-pay | Admitting: General Surgery

## 2024-01-17 DIAGNOSIS — K432 Incisional hernia without obstruction or gangrene: Secondary | ICD-10-CM | POA: Diagnosis not present

## 2024-01-17 NOTE — H&P (Signed)
 Chief Complaint: RE-CHECK     History of Present Illness: Kayla Lowe  is a 68 y.o. female who is seen today as an office consultation at the request of Dr. Arch for evaluation of RE-CHECK .   History of Present Illness Kayla Lowe  is a 68 year old female who presents for surgical consultation regarding a hernia.  She underwent a CT scan for the hernia.  CT shows a 11cm hernia.  She con' tot have pain and discomfort   She takes aspirin  daily. No history of myocardial infarction or anticoagulant use.      Review of Systems: A complete review of systems was obtained from the patient.  I have reviewed this information and discussed as appropriate with the patient.  See HPI as well for other ROS.  Review of Systems  Constitutional:  Negative for fever.  HENT:  Negative for congestion.   Eyes:  Negative for blurred vision.  Respiratory:  Negative for cough, shortness of breath and wheezing.   Cardiovascular:  Negative for chest pain and palpitations.  Gastrointestinal:  Negative for heartburn.  Genitourinary:  Negative for dysuria.  Musculoskeletal:  Negative for myalgias.  Skin:  Negative for rash.  Neurological:  Negative for dizziness and headaches.  Psychiatric/Behavioral:  Negative for depression and suicidal ideas.   All other systems reviewed and are negative.     Medical History: Past Medical History: Diagnosis Date  Arthritis   Chronic kidney disease   GERD (gastroesophageal reflux disease)   Hypertension    There is no problem list on file for this patient.   Past Surgical History: Procedure Laterality Date  EXPLORATORY LAPAROTOMY, WITH SMALL BOWEL RESECTION  02/04/2023  Dr. CHARM Poli  LT BR RSL(BCG) LUMP x2 & LT SLNB  07/03/2023  Dr Poli  HYSTERECTOMY      Allergies Allergen Reactions  Losartan  Potassium Other (See Comments)   Caused cough and sweats. Tingling in legs.  Statins-Hmg-Coa Reductase  Inhibitors Other (See Comments)   myalgias  Lisinopril  Cough   Current Outpatient Medications on File Prior to Visit Medication Sig Dispense Refill  acetaminophen  (TYLENOL ) 500 MG tablet Take by mouth    anastrozole  (ARIMIDEX ) 1 mg tablet Take 1 mg by mouth once daily    aspirin  81 MG EC tablet Take 81 mg by mouth once daily    carvediloL  (COREG ) 6.25 MG tablet Take 6.25 mg by mouth 2 (two) times daily with meals    cetirizine  (ZYRTEC ) 10 MG tablet Take 10 mg by mouth once daily    cholecalciferol  (VITAMIN D3) 2,000 unit capsule Take 1 capsule by mouth once daily    ezetimibe  (ZETIA ) 10 mg tablet Take 1 tablet by mouth once daily    famotidine  (PEPCID ) 40 MG tablet Take 40 mg by mouth at bedtime    ferrous sulfate  325 (65 FE) MG tablet Take 325 mg by mouth daily with breakfast    hydrALAZINE  (APRESOLINE ) 25 MG tablet Take 25 mg by mouth 3 (three) times daily    montelukast  (SINGULAIR ) 10 mg tablet Take 10 mg by mouth at bedtime    pantoprazole  (PROTONIX ) 20 MG DR tablet Take 20 mg by mouth once daily    polyethylene glycol (MIRALAX ) packet Take 17 g by mouth once daily as needed    pravastatin  (PRAVACHOL ) 40 MG tablet Take 40 mg by mouth once daily    tiZANidine  (ZANAFLEX ) 2 MG tablet Take 2 mg by mouth    traMADoL  (ULTRAM ) 50 mg tablet Take by mouth  triamterene -hydroCHLOROthiazide  (DYAZIDE) 37.5-25 mg capsule Take 1 capsule by mouth    ACCRUFER  30 mg Cap TAKE ONE CAPSULE BY MOUTH IN THE MORNING AND AT BEDTIME    amLODIPine  (NORVASC ) 10 MG tablet Take 10 mg by mouth once daily    cyclobenzaprine  (FLEXERIL ) 10 MG tablet Take 10 mg by mouth 3 (three) times daily as needed    ondansetron  (ZOFRAN ) 4 MG tablet Take 4 mg by mouth every 6 (six) hours as needed    potassium chloride  (KLOR-CON  M20) 20 MEQ ER tablet Take 40 mg by mouth once daily    No current facility-administered medications on file prior to visit.   Family History Problem Relation Age of Onset  High blood pressure  (Hypertension) Mother   Stroke Father   High blood pressure (Hypertension) Father   Coronary Artery Disease (Blocked arteries around heart) Brother   Diabetes Brother   High blood pressure (Hypertension) Brother   Diabetes Paternal Uncle   Diabetes Paternal Grandfather     Social History  Tobacco Use Smoking Status Former  Types: Cigarettes Smokeless Tobacco Never    Social History  Socioeconomic History  Marital status: Legally Separated Tobacco Use  Smoking status: Former   Types: Cigarettes  Smokeless tobacco: Never Vaping Use  Vaping status: Never Used Substance and Sexual Activity  Alcohol use: Not Currently  Drug use: Never  Social Drivers of Catering manager Strain: Low Risk  (09/12/2023)  Received [facility]  Overall Financial Resource Strain (CARDIA)   Difficulty of Paying Living Expenses: Not very hard Recent Concern: Financial Resource Strain - Medium Risk (08/07/2023)  Received from [facility]  Overall Financial Resource Strain (CARDIA)   Difficulty of Paying Living Expenses: Somewhat hard Food Insecurity: No Food Insecurity (09/12/2023)  Received [facility]  Hunger Vital Sign   Within the past 12 months, you worried that your food would run out before you got the money to buy more.: Never true   Within the past 12 months, the food you bought just didn't last and you didn't have money to get more.: Never true Recent Concern: Food Insecurity - Food Insecurity Present (06/14/2023)  Received from [facility]  Hunger Vital Sign   Worried About Running Out of Food in the Last Year: Sometimes true   Ran Out of Food in the Last Year: Sometimes true Transportation Needs: No Transportation Needs (09/12/2023)  Received from [facility] - Transportation   Lack of Transportation (Medical): No   Lack of Transportation (Non-Medical): No Physical Activity: Insufficiently Active (09/12/2023)  Received from [facility]  Exercise Vital  Sign   On average, how many days per week do you engage in moderate to strenuous exercise (like a brisk walk)?: 3 days   On average, how many minutes do you engage in exercise at this level?: 20 min Stress: Stress Concern Present (09/12/2023)  Received from [facility] - Occupational Stress Questionnaire   Feeling of Stress : To some extent Social Connections: Moderately Integrated (09/12/2023)  Received from [facility]  Social Connection and Isolation Panel   In a typical week, how many times do you talk on the phone with family, friends, or neighbors?: More than three times a week   How often do you get together with friends or relatives?: Once a week   How often do you attend church or religious services?: More than 4 times per year   Do you belong to any clubs  or organizations such as church groups, unions, fraternal or athletic groups, or school groups?: Yes   How often do you attend meetings of the clubs or organizations you belong to?: More than 4 times per year   Are you married, widowed, divorced, separated, never married, or living with a partner?: Separated Housing Stability: High Risk (08/07/2023)  Received from [facility]  Housing Stability Vital Sign   Unable to Pay for Housing in the Last Year: Yes   Homeless in the Last Year: No   Objective:   Vitals:  01/17/24 0859 PainSc: 0-No pain   There is no height or weight on file to calculate BMI.  Physical Exam Constitutional:      Appearance: Normal appearance.  HENT:     Head: Normocephalic and atraumatic.     Mouth/Throat:     Mouth: Mucous membranes are moist.     Pharynx: Oropharynx is clear.  Eyes:     General: No scleral icterus.    Pupils: Pupils are equal, round, and reactive to light.  Cardiovascular:     Rate and Rhythm: Normal rate and regular rhythm.     Pulses: Normal pulses.     Heart sounds: No murmur heard.    No friction rub. No gallop.  Pulmonary:      Effort: Pulmonary effort is normal. No respiratory distress.     Breath sounds: Normal breath sounds. No stridor.  Abdominal:     General: Abdomen is flat.     Hernia: A hernia is present.    Musculoskeletal:        General: No swelling.  Skin:    General: Skin is warm.  Neurological:     General: No focal deficit present.     Mental Status: She is alert and oriented to person, place, and time. Mental status is at baseline.  Psychiatric:        Mood and Affect: Mood normal.        Thought Content: Thought content normal.        Judgment: Judgment normal.      Hernia Size:11cm Incarcerated: no Initial Hernia   Assessment and Plan: Diagnoses and all orders for this visit:  Incisional hernia without obstruction or gangrene    Piera Downs  is a 68 y.o. female   1.  We will proceed to the OR for an OPEN VENTRAL hernia repair with mesh, likely TAR 2. All risks and benefits were discussed with the patient, to generally include infection, bleeding, damage to surrounding structures, acute and chronic nerve pain, and recurrence. Alternatives were offered and described.  All questions were answered and the patient voiced understanding of the procedure and wishes to proceed at this point.       No follow-ups on file.  Lynda Leos, MD, [facility], [place] [facility]

## 2024-02-03 ENCOUNTER — Ambulatory Visit: Attending: Surgery

## 2024-02-03 VITALS — Wt 201.1 lb

## 2024-02-03 DIAGNOSIS — Z483 Aftercare following surgery for neoplasm: Secondary | ICD-10-CM | POA: Insufficient documentation

## 2024-02-03 NOTE — Therapy (Signed)
 OUTPATIENT PHYSICAL THERAPY SOZO SCREENING NOTE   Patient Name: Kayla Lowe  MRN: [id number] DOB:05/19/1955, 68 y.o., female Today's Date: 02/03/2024  PCP: [name], MD REFERRING PROVIDER: Vernetta Berg, MD   PT End of Session - 02/03/24 [phone]     Visit Number 2   # unchanged due to screen only   PT Start Time 0926    PT Stop Time 0930    PT Time Calculation (min) 4 min    Activity Tolerance Patient tolerated treatment well    Behavior During Therapy [facility] for tasks assessed/performed          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   Osteoarthritis    Past Surgical History:  Procedure Laterality Date   ABDOMINAL HYSTERECTOMY  1987   BREAST BIOPSY Left 05/28/2023   US  LT BREAST BX W LOC DEV EA ADD LESION IMG BX SPEC US  GUIDE 05/28/2023 [facility]   BREAST BIOPSY Left 05/28/2023   US  LT BREAST BX W LOC DEV 1ST LESION IMG BX SPEC US  GUIDE 05/28/2023 [facility]   BREAST BIOPSY Left 07/02/2023   US  LT RADIOACTIVE SEED EA ADD LESION 07/02/2023 [facility]   BREAST BIOPSY Left 07/02/2023   US  LT RADIOACTIVE SEED LOC 07/02/2023 [facility]   BREAST LUMPECTOMY WITH RADIOACTIVE SEED AND SENTINEL LYMPH NODE BIOPSY Left 07/03/2023   Procedure: RADIOACTIVE SEED GUIDED LEFT BREAST LUMPECTOMY x2 AND LEFT SENTINEL NODE BIOPSY;  Surgeon: Vernetta Berg, MD;  Location: [facility];  Service: General;  Laterality: Left;  LMA   BREAST REDUCTION SURGERY Bilateral 08/16/2023   Procedure: MAMMOPLASTY, REDUCTION;  Surgeon: Waddell Leonce NOVAK, MD;  Location: [facility];  Service: Plastics;  Laterality: Bilateral;   LAPAROTOMY N/A 02/04/2023   Procedure: EXPLORATORY LAPAROTOMY, WITH SMALL BOWEL  RESECTION;  Surgeon: Vernetta Berg, MD;  Location: [facility];  Service: General;  Laterality: N/A;   Patient Active Problem List   Diagnosis Date Noted   Vaginal polyp 09/11/2023   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   Malignant neoplasm of upper-inner quadrant of left breast in female, estrogen receptor positive (HCC) 06/14/2023   Iron deficiency anemia due to chronic blood loss 03/13/2023   Nocturnal muscle cramps 10/17/2022   LVH (left ventricular hypertrophy) due to hypertensive disease, without heart failure 01/17/2022   Encounter for general adult medical examination with abnormal findings 01/09/2021   Visit for screening mammogram 07/12/2020   Primary osteoarthritis of both knees 07/11/2020   Intrinsic eczema 03/14/2020   Mild persistent asthma without complication 03/14/2020   Vitamin D  deficiency disease 09/18/2019   Familial hypercholesteremia 09/16/2019   Morton's neuroma of left foot 07/20/2019   Gastroesophageal reflux disease without esophagitis 02/09/2019   Osteopenia of neck of right femur 02/09/2019   Seasonal allergic rhinitis due to pollen 08/12/2017   Elevated rheumatoid factor 02/06/2016   Dyslipidemia, goal LDL below 130 09/10/2013    REFERRING DIAG: left breast cancer at risk for lymphedema  THERAPY DIAG:  Aftercare following surgery for neoplasm  PERTINENT HISTORY: Pt has two small areas in the left breast measuring 1cm and 7mm. ER/PR positive, HER2 negative. lumpectomy with SLNB on 07/03/23 with 5 negative nodes removed.  with oncotype  of 28 - will be starting chemotherapy with TC every 21 days x 4  and then pt will have radiation. Also new dx of marginal zone lymphoma - just monitoring this.  Breast reduction scheduled for 08/16/23.   PRECAUTIONS: left UE Lymphedema risk, None  SUBJECTIVE: Pt returns for her 3 month L-Dex screen.   PAIN:  Are you having pain? No  SOZO SCREENING: Patient was assessed today using the SOZO machine to determine the  lymphedema index score. This was compared to her baseline score. It was determined that she is within the recommended range when compared to her baseline and no further action is needed at this time. She will continue SOZO screenings. These are done every 3 months for 2 years post operatively followed by every 6 months for 2 years, and then annually.   L-DEX FLOWSHEETS - 02/03/24 0900       L-DEX LYMPHEDEMA SCREENING   Measurement Type Unilateral    L-DEX MEASUREMENT EXTREMITY Upper Extremity    POSITION  Standing    DOMINANT SIDE Left    At Risk Side Left    BASELINE SCORE (UNILATERAL) -8    L-DEX SCORE (UNILATERAL) -6.8    VALUE CHANGE (UNILAT) 1.2            Aden Berwyn Caldron, PTA 02/03/2024, 9:29 AM

## 2024-02-10 ENCOUNTER — Ambulatory Visit: Admitting: Plastic Surgery

## 2024-02-10 VITALS — BP 112/73 | HR 91

## 2024-02-10 DIAGNOSIS — Z853 Personal history of malignant neoplasm of breast: Secondary | ICD-10-CM | POA: Diagnosis not present

## 2024-02-10 DIAGNOSIS — Z170 Estrogen receptor positive status [ER+]: Secondary | ICD-10-CM

## 2024-02-10 DIAGNOSIS — Z08 Encounter for follow-up examination after completed treatment for malignant neoplasm: Secondary | ICD-10-CM | POA: Diagnosis not present

## 2024-02-10 DIAGNOSIS — Z923 Personal history of irradiation: Secondary | ICD-10-CM

## 2024-02-10 DIAGNOSIS — Z9889 Other specified postprocedural states: Secondary | ICD-10-CM

## 2024-02-10 NOTE — Progress Notes (Signed)
 [name]  returns today after undergoing a bilateral breast reduction in April 2025.  Her breast reduction was done after a lumpectomy for balancing and for recontouring the defect in her left breast.  The procedure was complicated by minor wound breakdown in the left breast.  This has since healed and she is overall doing well.  She is overall happy with the results though she does admit that her breasts are somewhat small.  On examination all incisions are well-healed and she has good shape and symmetry.  There is some discoloration of the left breast from her radiation therapy.  Status post bilateral breast reduction as part of her oncologic treatment.  Doing well.  Photographs were obtained today with her consent.  She will follow-up as needed or if she has any questions or concerns.

## 2024-02-28 ENCOUNTER — Inpatient Hospital Stay

## 2024-02-28 ENCOUNTER — Inpatient Hospital Stay: Attending: Hematology and Oncology | Admitting: Adult Health

## 2024-02-28 ENCOUNTER — Encounter: Payer: Self-pay | Admitting: Adult Health

## 2024-02-28 VITALS — BP 158/63 | HR 82 | Temp 97.5°F | Resp 16 | Wt 205.6 lb

## 2024-02-28 DIAGNOSIS — Z8572 Personal history of non-Hodgkin lymphomas: Secondary | ICD-10-CM | POA: Insufficient documentation

## 2024-02-28 DIAGNOSIS — Z803 Family history of malignant neoplasm of breast: Secondary | ICD-10-CM | POA: Insufficient documentation

## 2024-02-28 DIAGNOSIS — C50212 Malignant neoplasm of upper-inner quadrant of left female breast: Secondary | ICD-10-CM

## 2024-02-28 DIAGNOSIS — Z923 Personal history of irradiation: Secondary | ICD-10-CM | POA: Insufficient documentation

## 2024-02-28 DIAGNOSIS — Z170 Estrogen receptor positive status [ER+]: Secondary | ICD-10-CM | POA: Insufficient documentation

## 2024-02-28 DIAGNOSIS — Z809 Family history of malignant neoplasm, unspecified: Secondary | ICD-10-CM | POA: Diagnosis not present

## 2024-02-28 LAB — CBC WITH DIFFERENTIAL (CANCER CENTER ONLY)
Abs Immature Granulocytes: 0.02 K/uL (ref 0.00–0.07)
Basophils Absolute: 0.1 K/uL (ref 0.0–0.1)
Basophils Relative: 1 %
Eosinophils Absolute: 0.2 K/uL (ref 0.0–0.5)
Eosinophils Relative: 4 %
HCT: 39.5 % (ref 36.0–46.0)
Hemoglobin: 12.8 g/dL (ref 12.0–15.0)
Immature Granulocytes: 0 %
Lymphocytes Relative: 20 %
Lymphs Abs: 1.1 K/uL (ref 0.7–4.0)
MCH: 31.4 pg (ref 26.0–34.0)
MCHC: 32.4 g/dL (ref 30.0–36.0)
MCV: 96.8 fL (ref 80.0–100.0)
Monocytes Absolute: 0.7 K/uL (ref 0.1–1.0)
Monocytes Relative: 12 %
Neutro Abs: 3.6 K/uL (ref 1.7–7.7)
Neutrophils Relative %: 63 %
Platelet Count: 300 K/uL (ref 150–400)
RBC: 4.08 MIL/uL (ref 3.87–5.11)
RDW: 12.9 % (ref 11.5–15.5)
WBC Count: 5.7 K/uL (ref 4.0–10.5)
nRBC: 0 % (ref 0.0–0.2)

## 2024-02-28 LAB — CMP (CANCER CENTER ONLY)
ALT: 7 U/L (ref 0–44)
AST: 13 U/L — ABNORMAL LOW (ref 15–41)
Albumin: 4.2 g/dL (ref 3.5–5.0)
Alkaline Phosphatase: 88 U/L (ref 38–126)
Anion gap: 4 — ABNORMAL LOW (ref 5–15)
BUN: 14 mg/dL (ref 8–23)
CO2: 29 mmol/L (ref 22–32)
Calcium: 11 mg/dL — ABNORMAL HIGH (ref 8.9–10.3)
Chloride: 106 mmol/L (ref 98–111)
Creatinine: 1.06 mg/dL — ABNORMAL HIGH (ref 0.44–1.00)
GFR, Estimated: 57 mL/min — ABNORMAL LOW (ref >60)
Glucose, Bld: 86 mg/dL (ref 70–99)
Potassium: 4.4 mmol/L (ref 3.5–5.1)
Sodium: 139 mmol/L (ref 135–145)
Total Bilirubin: 0.4 mg/dL (ref 0.0–1.2)
Total Protein: 7.9 g/dL (ref 6.5–8.1)

## 2024-02-28 MED ORDER — LETROZOLE 2.5 MG PO TABS: 2.5000 mg | ORAL_TABLET | Freq: Every day | ORAL | 0 refills | Status: DC

## 2024-02-28 NOTE — Progress Notes (Unsigned)
 SURVIVORSHIP VISIT:  BRIEF ONCOLOGIC HISTORY:  Oncology History  Malignant neoplasm of upper-inner quadrant of left breast in female, estrogen receptor positive (HCC)  06/14/2023 Initial Diagnosis   Malignant neoplasm of upper-inner quadrant of left breast in female, estrogen receptor positive (HCC)   06/14/2023 Cancer Staging   Staging form: Breast, AJCC 8th Edition - Clinical stage from 06/14/2023: Stage IA (cT1, cN0, cM0, G3, ER+, PR+, HER2-) - Signed by Loretha Ash, MD on 06/17/2023 Stage prefix: Initial diagnosis Method of lymph node assessment: Clinical Histologic grading system: 3 grade system   09/02/2023 -  Chemotherapy   Patient is on Treatment Plan : BREAST TC q21d     09/11/2023 Cancer Staging   Staging form: Breast, AJCC 8th Edition - Pathologic stage from 09/11/2023: Stage IB (pT2, pN0, cM0, G3, ER+, PR+, HER2-) - Signed by Wyatt Leeroy HERO, PA-C on 09/11/2023 Stage prefix: Initial diagnosis Histologic grading system: 3 grade system   10/30/2023 - 11/27/2023 Radiation Therapy   Plan Name: Breast_L Site: Breast, Left Technique: 3D Mode: Photon Dose Per Fraction: 2.66 Gy Prescribed Dose (Delivered / Prescribed): 42.56 Gy / 42.56 Gy Prescribed Fxs (Delivered / Prescribed): 16 / 16   Plan Name: Breast_L_Bst Site: Breast, Left Technique: Electron Mode: Electron Dose Per Fraction: 2 Gy Prescribed Dose (Delivered / Prescribed): 8 Gy / 8 Gy Prescribed Fxs (Delivered / Prescribed): 4 / 4     11/2023 -  Anti-estrogen oral therapy   Anastrozole      INTERVAL HISTORY:  Kayla Lowe  to review her survivorship care plan detailing her treatment course for breast cancer, as well as monitoring long-term side effects of that treatment, education regarding health maintenance, screening, and overall wellness and health promotion.     Overall, Kayla Lowe  reports feeling quite well   REVIEW OF SYSTEMS:  Review of Systems - Oncology Breast: Denies any new  nodularity, masses, tenderness, nipple changes, or nipple discharge.       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   Osteoarthritis    Past Surgical History:  Procedure Laterality Date   ABDOMINAL HYSTERECTOMY  1987   BREAST BIOPSY Left 05/28/2023   US  LT BREAST BX W LOC DEV EA ADD LESION IMG BX SPEC US  GUIDE 05/28/2023 [facility]   BREAST BIOPSY Left 05/28/2023   US  LT BREAST BX W LOC DEV 1ST LESION IMG BX SPEC US  GUIDE 05/28/2023 [facility]   BREAST BIOPSY Left 07/02/2023   US  LT RADIOACTIVE SEED EA ADD LESION 07/02/2023 [facility]   BREAST BIOPSY Left 07/02/2023   US  LT RADIOACTIVE SEED LOC 07/02/2023 [facility]   BREAST LUMPECTOMY WITH RADIOACTIVE SEED AND SENTINEL LYMPH NODE BIOPSY Left 07/03/2023   Procedure: RADIOACTIVE SEED GUIDED LEFT BREAST LUMPECTOMY x2 AND LEFT SENTINEL NODE BIOPSY;  Surgeon: Vernetta Berg, MD;  Location: [facility];  Service: General;  Laterality: Left;  LMA   BREAST REDUCTION SURGERY Bilateral 08/16/2023   Procedure: MAMMOPLASTY, REDUCTION;  Surgeon: Waddell Leonce NOVAK, MD;  Location: [facility];  Service: Plastics;  Laterality: Bilateral;   LAPAROTOMY N/A 02/04/2023   Procedure: EXPLORATORY LAPAROTOMY, WITH SMALL BOWEL RESECTION;  Surgeon: Vernetta Berg, MD;  Location:  [facility];  Service: General;  Laterality: N/A;     ALLERGIES:  Allergies  Allergen Reactions   Cozaar  [Losartan  Potassium] Other (See Comments)    Caused cough and sweats. Tingling in legs.   Lisinopril  Cough   Statins Other (See Comments)    myalgias     CURRENT MEDICATIONS:  Outpatient Encounter Medications as of 02/28/2024  Medication Sig   acetaminophen   (TYLENOL ) 500 MG tablet Take 2 tablets (1,000 mg total) by mouth 4 (four) times daily.   anastrozole  (ARIMIDEX ) 1 MG tablet Take 1 tablet (1 mg total) by mouth daily.   aspirin  EC 81 MG tablet Take 81 mg by mouth daily. Swallow whole.   carvedilol  (COREG ) 6.25 MG tablet TAKE 1 TABLET(6.25 MG) BY MOUTH TWICE DAILY WITH A MEAL   Cholecalciferol  (VITAMIN D ) 50 MCG (2000 UT) CAPS Take 1 capsule by mouth daily.   ezetimibe  (ZETIA ) 10 MG tablet TAKE 1 TABLET(10 MG) BY MOUTH DAILY   famotidine  (PEPCID ) 40 MG tablet TAKE 1 TABLET(40 MG) BY MOUTH AT BEDTIME   ferrous sulfate  325 (65 FE) MG tablet Take 1 tablet (325 mg total) by mouth daily with breakfast.   fluticasone  (FLONASE ) 50 MCG/ACT nasal spray SHAKE LIQUID AND USE 2 SPRAYS IN EACH NOSTRIL DAILY   hydrALAZINE  (APRESOLINE ) 25 MG tablet TAKE 1 TABLET(25 MG) BY MOUTH THREE TIMES DAILY   Loratadine  (CLARITIN  PO) Take by mouth daily.   montelukast  (SINGULAIR ) 10 MG tablet TAKE 1 TABLET BY MOUTH EVERY NIGHT AT BEDTIME   pantoprazole  (PROTONIX ) 20 MG tablet TAKE 1 TABLET(20 MG) BY MOUTH DAILY   pravastatin  (PRAVACHOL ) 40 MG tablet TAKE 1 TABLET(40 MG) BY MOUTH DAILY   tiZANidine  (ZANAFLEX ) 2 MG tablet TAKE 1 TABLET(2 MG) BY MOUTH EVERY 8 HOURS AS NEEDED FOR MUSCLE SPASMS   traMADol  (ULTRAM ) 50 MG tablet Take 1 tablet (50 mg total) by mouth every 6 (six) hours as needed.   triamterene -hydrochlorothiazide  (DYAZIDE) 37.5-25 MG capsule Take 1 each (1 capsule total) by mouth daily.   No facility-administered encounter medications on file as of 02/28/2024.     ONCOLOGIC FAMILY HISTORY:  Family History  Problem Relation Age of Onset   Cancer Father    Hypertension Brother    Diabetes Brother    Diabetes Paternal Grandfather    Diabetes Paternal Uncle    Breast cancer Paternal Aunt    Colon cancer Neg Hx    Hypercalcemia Neg Hx      SOCIAL HISTORY:  Social History   Socioeconomic History   Marital status: Legally Separated    Spouse name: Not  on file   Number of children: 3   Years of education: Not on file   Highest education level: Some college, no degree  Occupational History   Occupation: RETIRED/nanny  Tobacco Use   Smoking status: Never   Smokeless tobacco: Never  Vaping Use   Vaping status: Never Used  Substance and Sexual Activity   Alcohol use: No   Drug use: No   Sexual activity: Not Currently    Birth control/protection: Surgical    Comment: Hyst  Other Topics Concern   Not on file  Social History Narrative   Lives with a roomate   Social Drivers of Health   Financial Resource Strain: Low Risk  (09/12/2023)   Overall Financial Resource Strain (CARDIA)    Difficulty of Paying Living Expenses: Not very hard  Recent Concern: Financial Resource Strain - Medium Risk (08/07/2023)   Overall Financial Resource Strain (CARDIA)  Difficulty of Paying Living Expenses: Somewhat hard  Food Insecurity: No Food Insecurity (09/12/2023)   Hunger Vital Sign    Worried About Running Out of Food in the Last Year: Never true    Ran Out of Food in the Last Year: Never true  Recent Concern: Food Insecurity - Food Insecurity Present (06/14/2023)   Hunger Vital Sign    Worried About Running Out of Food in the Last Year: Sometimes true    Ran Out of Food in the Last Year: Sometimes true  Transportation Needs: No Transportation Needs (09/12/2023)   PRAPARE - Administrator, Civil Service (Medical): No    Lack of Transportation (Non-Medical): No  Physical Activity: Insufficiently Active (09/12/2023)   Exercise Vital Sign    Days of Exercise per Week: 3 days    Minutes of Exercise per Session: 20 min  Stress: Stress Concern Present (09/12/2023)   [employer] of Occupational Health - Occupational Stress Questionnaire    Feeling of Stress : To some extent  Social Connections: Moderately Integrated (09/12/2023)   Social Connection and Isolation Panel    Frequency of Communication with Friends and Family: More than three  times a week    Frequency of Social Gatherings with Friends and Family: Once a week    Attends Religious Services: More than 4 times per year    Active Member of [employer] or Organizations: Yes    Attends Banker Meetings: More than 4 times per year    Marital Status: Separated  Intimate Partner Violence: Patient Unable To Answer (08/07/2023)   Humiliation, Afraid, Rape, and Kick questionnaire    Fear of Current or Ex-Partner: Patient unable to answer    Emotionally Abused: Patient unable to answer    Physically Abused: Patient unable to answer    Sexually Abused: Patient unable to answer     OBSERVATIONS/OBJECTIVE:  There were no vitals taken for this visit. GENERAL: Patient is a well appearing female in no acute distress HEENT:  Sclerae anicteric.  Oropharynx clear and moist. No ulcerations or evidence of oropharyngeal candidiasis. Neck is supple.  NODES:  No cervical, supraclavicular, or axillary lymphadenopathy palpated.  BREAST EXAM:  Deferred. LUNGS:  Clear to auscultation bilaterally.  No wheezes or rhonchi. HEART:  Regular rate and rhythm. No murmur appreciated. ABDOMEN:  Soft, nontender.  Positive, normoactive bowel sounds. No organomegaly palpated. MSK:  No focal spinal tenderness to palpation. Full range of motion bilaterally in the upper extremities. EXTREMITIES:  No peripheral edema.   SKIN:  Clear with no obvious rashes or skin changes. No nail dyscrasia. NEURO:  Nonfocal. Well oriented.  Appropriate affect.   LABORATORY DATA:  None for this visit.  DIAGNOSTIC IMAGING:  None for this visit.      ASSESSMENT AND PLAN:  Ms.. Lowe  is a pleasant 68 y.o. female with Stage IB left breast invasive ductal carcinoma, ER+/PR+/HER2-, diagnosed in 05/2023, treated with lumpectomy, adjuvant radiation therapy, and anti-estrogen therapy with Anastrozole  beginning in 11/2023.  She presents to the Survivorship Clinic for our initial meeting and routine follow-up  post-completion of treatment for breast cancer.    1. Stage IB right breast cancer:  Kayla Lowe  is continuing to recover from definitive treatment for breast cancer. She will follow-up with her medical oncologist, Dr.  Loretha with history and physical exam per surveillance protocol.  She will continue her anti-estrogen therapy with Anastrozole . Thus far, she is tolerating the Anastrozole  well, with minimal side effects. Her mammogram is due  05/2024; orders placed today.   Today, a comprehensive survivorship care plan and treatment summary was reviewed with the patient today detailing her breast cancer diagnosis, treatment course, potential late/long-term effects of treatment, appropriate follow-up care with recommendations for the future, and patient education resources.  A copy of this summary, along with a letter will be sent to the patient's primary care provider via mail/fax/In Basket message after today's visit.    #. Problem(s) at Visit______________  #. Bone health:  Given Kayla Lowe 's age/history of breast cancer and her current treatment regimen including anti-estrogen therapy with Anastrozole , she is at risk for bone demineralization.  Her last DEXA scan was 09/2022 and demonstrated osteopenia with a t score of -1.5 in the right femur. She was given education on specific activities to promote bone health.  #. Cancer screening:  Due to Kayla Lowe 's history and her age, she should receive screening for skin cancers, colon cancer, and gynecologic cancers.  The information and recommendations are listed on the patient's comprehensive care plan/treatment summary and were reviewed in detail with the patient.    #. Health maintenance and wellness promotion: Kayla Lowe  was encouraged to consume 5-7 servings of fruits and vegetables per day. We reviewed the Nutrition Rainbow handout.  She was also encouraged to engage in moderate to vigorous exercise for 30  minutes per day most days of the week.  She was instructed to limit her alcohol consumption and continue to abstain from tobacco use/***was encouraged stop smoking.     #. Support services/counseling: It is not uncommon for this period of the patient's cancer care trajectory to be one of many emotions and stressors.   She was given information regarding our available services and encouraged to contact me with any questions or for help enrolling in any of our support group/programs.    Follow up instructions:    -Return to cancer center ***  -Mammogram due in 05/2024 -She is welcome to return back to the Survivorship Clinic at any time; no additional follow-up needed at this time.  -Consider referral back to survivorship as a long-term survivor for continued surveillance  The patient was provided an opportunity to ask questions and all were answered. The patient agreed with the plan and demonstrated an understanding of the instructions.   Total encounter time:*** minutes*in face-to-face visit time, chart review, lab review, care coordination, order entry, and documentation of the encounter time.    Morna Kendall, NP 02/28/24 5:32 AM Medical Oncology and Hematology [facility] [address] [place], [place] [address] Tel. [phone]    Fax. [phone]  *Total Encounter Time as defined by the Centers for Medicare and Medicaid Services includes, in addition to the face-to-face time of a patient visit (documented in the note above) non-face-to-face time: obtaining and reviewing outside history, ordering and reviewing medications, tests or procedures, care coordination (communications with other health care professionals or caregivers) and documentation in the medical record.

## 2024-03-02 ENCOUNTER — Encounter: Payer: Self-pay | Admitting: Internal Medicine

## 2024-03-02 ENCOUNTER — Ambulatory Visit: Payer: Self-pay

## 2024-03-03 ENCOUNTER — Inpatient Hospital Stay

## 2024-03-03 ENCOUNTER — Inpatient Hospital Stay: Admitting: Hematology and Oncology

## 2024-03-10 ENCOUNTER — Encounter: Payer: Self-pay | Admitting: *Deleted

## 2024-03-11 ENCOUNTER — Encounter: Payer: Self-pay | Admitting: Adult Health

## 2024-03-17 LAB — GUARDANT REVEAL

## 2024-03-18 ENCOUNTER — Encounter: Payer: Self-pay | Admitting: Internal Medicine

## 2024-03-18 ENCOUNTER — Ambulatory Visit (INDEPENDENT_AMBULATORY_CARE_PROVIDER_SITE_OTHER): Admitting: Internal Medicine

## 2024-03-18 VITALS — BP 138/82 | HR 75 | Temp 98.5°F | Resp 16 | Ht 63.0 in | Wt 200.0 lb

## 2024-03-18 DIAGNOSIS — I1A0 Resistant hypertension: Secondary | ICD-10-CM

## 2024-03-18 DIAGNOSIS — E785 Hyperlipidemia, unspecified: Secondary | ICD-10-CM

## 2024-03-18 DIAGNOSIS — R9431 Abnormal electrocardiogram [ECG] [EKG]: Secondary | ICD-10-CM

## 2024-03-18 DIAGNOSIS — Z23 Encounter for immunization: Secondary | ICD-10-CM | POA: Insufficient documentation

## 2024-03-18 DIAGNOSIS — J301 Allergic rhinitis due to pollen: Secondary | ICD-10-CM

## 2024-03-18 DIAGNOSIS — I119 Hypertensive heart disease without heart failure: Secondary | ICD-10-CM | POA: Diagnosis not present

## 2024-03-18 LAB — LIPID PANEL
Cholesterol: 174 mg/dL (ref 0–200)
HDL: 50.3 mg/dL (ref >39.00)
LDL Cholesterol: 89 mg/dL (ref 0–99)
NonHDL: 123.31
Total CHOL/HDL Ratio: 3
Triglycerides: 173 mg/dL — ABNORMAL HIGH (ref 0.0–149.0)
VLDL: 34.6 mg/dL (ref 0.0–40.0)

## 2024-03-18 LAB — TROPONIN I: Troponin I: 7 ng/L (ref <=47)

## 2024-03-18 LAB — PHOSPHORUS: Phosphorus: 2.9 mg/dL (ref 2.3–4.6)

## 2024-03-18 MED ORDER — CETIRIZINE HCL 10 MG PO TABS: 10.0000 mg | ORAL_TABLET | Freq: Every day | ORAL | 1 refills | Status: AC

## 2024-03-18 NOTE — Patient Instructions (Signed)
 Hypertension, Adult High blood pressure (hypertension) is when the force of blood pumping through the arteries is too strong. The arteries are the blood vessels that carry blood from the heart throughout the body. Hypertension forces the heart to work harder to pump blood and may cause arteries to become narrow or stiff. Untreated or uncontrolled hypertension can lead to a heart attack, heart failure, a stroke, kidney disease, and other problems. A blood pressure reading consists of a higher number over a lower number. Ideally, your blood pressure should be below 120/80. The first ("top") number is called the systolic pressure. It is a measure of the pressure in your arteries as your heart beats. The second ("bottom") number is called the diastolic pressure. It is a measure of the pressure in your arteries as the heart relaxes. What are the causes? The exact cause of this condition is not known. There are some conditions that result in high blood pressure. What increases the risk? Certain factors may make you more likely to develop high blood pressure. Some of these risk factors are under your control, including: Smoking. Not getting enough exercise or physical activity. Being overweight. Having too much fat, sugar, calories, or salt (sodium) in your diet. Drinking too much alcohol. Other risk factors include: Having a personal history of heart disease, diabetes, high cholesterol, or kidney disease. Stress. Having a family history of high blood pressure and high cholesterol. Having obstructive sleep apnea. Age. The risk increases with age. What are the signs or symptoms? High blood pressure may not cause symptoms. Very high blood pressure (hypertensive crisis) may cause: Headache. Fast or irregular heartbeats (palpitations). Shortness of breath. Nosebleed. Nausea and vomiting. Vision changes. Severe chest pain, dizziness, and seizures. How is this diagnosed? This condition is diagnosed by  measuring your blood pressure while you are seated, with your arm resting on a flat surface, your legs uncrossed, and your feet flat on the floor. The cuff of the blood pressure monitor will be placed directly against the skin of your upper arm at the level of your heart. Blood pressure should be measured at least twice using the same arm. Certain conditions can cause a difference in blood pressure between your right and left arms. If you have a high blood pressure reading during one visit or you have normal blood pressure with other risk factors, you may be asked to: Return on a different day to have your blood pressure checked again. Monitor your blood pressure at home for 1 week or longer. If you are diagnosed with hypertension, you may have other blood or imaging tests to help your health care provider understand your overall risk for other conditions. How is this treated? This condition is treated by making healthy lifestyle changes, such as eating healthy foods, exercising more, and reducing your alcohol intake. You may be referred for counseling on a healthy diet and physical activity. Your health care provider may prescribe medicine if lifestyle changes are not enough to get your blood pressure under control and if: Your systolic blood pressure is above 130. Your diastolic blood pressure is above 80. Your personal target blood pressure may vary depending on your medical conditions, your age, and other factors. Follow these instructions at home: Eating and drinking  Eat a diet that is high in fiber and potassium, and low in sodium, added sugar, and fat. An example of this eating plan is called the DASH diet. DASH stands for Dietary Approaches to Stop Hypertension. To eat this way: Eat  plenty of fresh fruits and vegetables. Try to fill one half of your plate at each meal with fruits and vegetables. Eat whole grains, such as whole-wheat pasta, brown rice, or whole-grain bread. Fill about one  fourth of your plate with whole grains. Eat or drink low-fat dairy products, such as skim milk or low-fat yogurt. Avoid fatty cuts of meat, processed or cured meats, and poultry with skin. Fill about one fourth of your plate with lean proteins, such as fish, chicken without skin, beans, eggs, or tofu. Avoid pre-made and processed foods. These tend to be higher in sodium, added sugar, and fat. Reduce your daily sodium intake. Many people with hypertension should eat less than 1,500 mg of sodium a day. Do not drink alcohol if: Your health care provider tells you not to drink. You are pregnant, may be pregnant, or are planning to become pregnant. If you drink alcohol: Limit how much you have to: 0-1 drink a day for women. 0-2 drinks a day for men. Know how much alcohol is in your drink. In the [place] one drink equals one 12 oz bottle of beer (355 mL), one 5 oz glass of wine (148 mL), or one 1 oz glass of hard liquor (44 mL). Lifestyle  Work with your health care provider to maintain a healthy body weight or to lose weight. Ask what an ideal weight is for you. Get at least 30 minutes of exercise that causes your heart to beat faster (aerobic exercise) most days of the week. Activities may include walking, swimming, or biking. Include exercise to strengthen your muscles (resistance exercise), such as Pilates or lifting weights, as part of your weekly exercise routine. Try to do these types of exercises for 30 minutes at least 3 days a week. Do not use any products that contain nicotine or tobacco. These products include cigarettes, chewing tobacco, and vaping devices, such as e-cigarettes. If you need help quitting, ask your health care provider. Monitor your blood pressure at home as told by your health care provider. Keep all follow-up visits. This is important. Medicines Take over-the-counter and prescription medicines only as told by your health care provider. Follow directions carefully. Blood  pressure medicines must be taken as prescribed. Do not skip doses of blood pressure medicine. Doing this puts you at risk for problems and can make the medicine less effective. Ask your health care provider about side effects or reactions to medicines that you should watch for. Contact a health care provider if you: Think you are having a reaction to a medicine you are taking. Have headaches that keep coming back (recurring). Feel dizzy. Have swelling in your ankles. Have trouble with your vision. Get help right away if you: Develop a severe headache or confusion. Have unusual weakness or numbness. Feel faint. Have severe pain in your chest or abdomen. Vomit repeatedly. Have trouble breathing. These symptoms may be an emergency. Get help right away. Call 911. Do not wait to see if the symptoms will go away. Do not drive yourself to the hospital. Summary Hypertension is when the force of blood pumping through your arteries is too strong. If this condition is not controlled, it may put you at risk for serious complications. Your personal target blood pressure may vary depending on your medical conditions, your age, and other factors. For most people, a normal blood pressure is less than 120/80. Hypertension is treated with lifestyle changes, medicines, or a combination of both. Lifestyle changes include losing weight, eating a healthy,  low-sodium diet, exercising more, and limiting alcohol. This information is not intended to replace advice given to you by your health care provider. Make sure you discuss any questions you have with your health care provider. Document Revised: 03/07/2021 Document Reviewed: 03/07/2021 Elsevier Patient Education  2024 [employer].

## 2024-03-18 NOTE — Progress Notes (Signed)
 Subjective:  Patient ID: Kayla Lowe , female    DOB: 1955-12-24  Age: 68 y.o. MRN: [id number]  CC: Hypertension and Hyperlipidemia   HPI Kayla Lowe  presents for f/up ---  Discussed the use of AI scribe software for clinical note transcription with the patient, who gave verbal consent to proceed.  History of Present Illness Kayla Lowe  is a 68 year old female who presents with joint pain and indigestion.  She has been experiencing joint pain that began approximately one month after starting anastrozole  for breast cancer treatment. The pain was severe, affecting her right arm and shoulder, and radiated down to her wrist, making it difficult to move her arm. Her oncologist switched her medication after she reported pain in her right arm and shoulder that began approximately one month after starting anastrozole . She uses topical Voltaren for relief and has similar symptoms in her thumb, which occasionally locks and requires manual straightening.  She experiences indigestion, described as pain under the esophagus, particularly after consuming certain foods like fried chicken. No pain when swallowing or food getting stuck. She takes pantoprazole  in the morning and famotidine  at night, which effectively manage her symptoms.  She takes Tylenol , 500 mg, two tablets three times a day, for pain management following surgery. She is unsure if she is still in pain due to the regular use of Tylenol .  She takes ferrous sulfate  daily as prescribed. She mentions a previous instruction to increase protein intake following breast surgery, which she has been doing through protein drinks. Recent lab work indicated elevated calcium  and creatinine levels.     Outpatient Medications Prior to Visit  Medication Sig Dispense Refill   acetaminophen  (TYLENOL ) 500 MG tablet Take 2 tablets (1,000 mg total) by mouth 4 (four) times daily. 30 tablet 0   aspirin  EC 81 MG  tablet Take 81 mg by mouth daily. Swallow whole.     carvedilol  (COREG ) 6.25 MG tablet TAKE 1 TABLET(6.25 MG) BY MOUTH TWICE DAILY WITH A MEAL 180 tablet 1   Cholecalciferol  (VITAMIN D ) 50 MCG (2000 UT) CAPS Take 1 capsule by mouth daily.     ezetimibe  (ZETIA ) 10 MG tablet TAKE 1 TABLET(10 MG) BY MOUTH DAILY 90 tablet 0   famotidine  (PEPCID ) 40 MG tablet TAKE 1 TABLET(40 MG) BY MOUTH AT BEDTIME 90 tablet 1   ferrous sulfate  325 (65 FE) MG tablet Take 1 tablet (325 mg total) by mouth daily with breakfast. 90 tablet 1   fluticasone  (FLONASE ) 50 MCG/ACT nasal spray SHAKE LIQUID AND USE 2 SPRAYS IN EACH NOSTRIL DAILY 48 g 1   hydrALAZINE  (APRESOLINE ) 25 MG tablet TAKE 1 TABLET(25 MG) BY MOUTH THREE TIMES DAILY 270 tablet 0   letrozole (FEMARA) 2.5 MG tablet Take 1 tablet (2.5 mg total) by mouth daily. 90 tablet 0   montelukast  (SINGULAIR ) 10 MG tablet TAKE 1 TABLET BY MOUTH EVERY NIGHT AT BEDTIME 90 tablet 0   pantoprazole  (PROTONIX ) 20 MG tablet TAKE 1 TABLET(20 MG) BY MOUTH DAILY 90 tablet 1   pravastatin  (PRAVACHOL ) 40 MG tablet TAKE 1 TABLET(40 MG) BY MOUTH DAILY 90 tablet 1   tiZANidine  (ZANAFLEX ) 2 MG tablet TAKE 1 TABLET(2 MG) BY MOUTH EVERY 8 HOURS AS NEEDED FOR MUSCLE SPASMS 270 tablet 0   traMADol  (ULTRAM ) 50 MG tablet Take 1 tablet (50 mg total) by mouth every 6 (six) hours as needed. 270 tablet 0   triamterene -hydrochlorothiazide  (DYAZIDE) 37.5-25 MG capsule Take 1 each (1 capsule total) by mouth daily. 90 capsule 1  Loratadine  (CLARITIN  PO) Take by mouth daily.     No facility-administered medications prior to visit.    ROS Review of Systems  Constitutional:  Negative for appetite change, chills, diaphoresis, fatigue and fever.  HENT: Negative.    Respiratory: Negative.  Negative for cough, chest tightness, shortness of breath and wheezing.   Cardiovascular:  Negative for chest pain, palpitations and leg swelling.  Gastrointestinal: Negative.  Negative for abdominal pain,  constipation, diarrhea, nausea and vomiting.  Endocrine: Negative.   Genitourinary: Negative.  Negative for difficulty urinating, dysuria and hematuria.  Musculoskeletal:  Positive for arthralgias. Negative for back pain, joint swelling and myalgias.  Skin: Negative.   Neurological:  Negative for dizziness and weakness.  Hematological:  Negative for adenopathy. Does not bruise/bleed easily.  Psychiatric/Behavioral: Negative.      Objective:  BP 138/82 (BP Location: Right Arm, Patient Position: Sitting, Cuff Size: Normal)   Pulse 75   Temp 98.5 F (36.9 C) (Oral)   Resp 16   Ht 5' 3 (1.6 m)   Wt 200 lb (90.7 kg)   SpO2 96%   BMI 35.43 kg/m   BP Readings from Last 3 Encounters:  03/18/24 138/82  02/28/24 (!) 158/63  02/10/24 112/73    Wt Readings from Last 3 Encounters:  03/18/24 200 lb (90.7 kg)  02/28/24 205 lb 9.6 oz (93.3 kg)  02/03/24 201 lb 2 oz (91.2 kg)    Physical Exam Vitals reviewed.  Constitutional:      Appearance: Normal appearance.  HENT:     Nose: Nose normal.     Mouth/Throat:     Mouth: Mucous membranes are moist.  Eyes:     General: No scleral icterus.    Conjunctiva/sclera: Conjunctivae normal.  Cardiovascular:     Rate and Rhythm: Normal rate and regular rhythm.     Heart sounds: No murmur heard.    No friction rub. No gallop.     Comments: EKG---- NSR with SA, 67 bpm LAD LVH TWI in inferior/lateral leads is new Pulmonary:     Effort: Pulmonary effort is normal.     Breath sounds: No stridor. No wheezing, rhonchi or rales.  Abdominal:     General: Bowel sounds are normal. There is no distension.     Palpations: Abdomen is soft. There is no fluid wave, hepatomegaly, splenomegaly or mass.     Tenderness: There is no abdominal tenderness. There is no guarding.     Hernia: No hernia is present.  Musculoskeletal:        General: No swelling or deformity.     Cervical back: Neck supple.     Right lower leg: No edema.     Left lower leg:  No edema.  Lymphadenopathy:     Cervical: No cervical adenopathy.  Skin:    General: Skin is warm and dry.  Neurological:     General: No focal deficit present.     Mental Status: She is alert.     Lab Results  Component Value Date   WBC 5.7 02/28/2024   HGB 12.8 02/28/2024   HCT 39.5 02/28/2024   PLT 300 02/28/2024   GLUCOSE 86 02/28/2024   CHOL 174 03/18/2024   TRIG 173.0 (H) 03/18/2024   HDL 50.30 03/18/2024   LDLDIRECT 220.0 09/16/2019   LDLCALC 89 03/18/2024   ALT 7 02/28/2024   AST 13 (L) 02/28/2024   NA 139 02/28/2024   K 4.4 02/28/2024   CL 106 02/28/2024   CREATININE 1.06 (H)  02/28/2024   BUN 14 02/28/2024   CO2 29 02/28/2024   TSH 1.01 03/18/2024   INR 0.97 07/10/2013   HGBA1C 5.4 08/31/2019    No results found.  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 -     EKG 12-Lead  Dyslipidemia, goal LDL below 130- LDL goal achieved. Doing well on the statin  -     Lipid panel; Future -     TSH; Future  Need for immunization against influenza -     Flu vaccine HIGH DOSE PF(Fluzone Trivalent)  Hypercalcemia- Ca++ was not completed. PTH was normal. -     VITAMIN D  25 Hydroxy (Vit-D Deficiency, Fractures); Future -     PTH, intact and calcium ; Future -     Phosphorus; Future  Seasonal allergic rhinitis due to pollen -     Cetirizine  HCl; Take 1 tablet (10 mg total) by mouth daily.  Dispense: 90 tablet; Refill: 1  Abnormal electrocardiogram (ECG) (EKG) -     Brain natriuretic peptide; Future -     Troponin I (High Sensitivity); Future -     Troponin I -; Future -     Brain natriuretic peptide; Future -     ECHOCARDIOGRAM COMPLETE; Future     Follow-up: Return in about 6 months (around 09/15/2024).  Debby Molt, MD

## 2024-03-19 LAB — VITAMIN D 25 HYDROXY (VIT D DEFICIENCY, FRACTURES): VITD: 38.16 ng/mL (ref 30.00–100.00)

## 2024-03-19 LAB — TSH: TSH: 1.01 u[IU]/mL (ref 0.35–5.50)

## 2024-03-23 LAB — BRAIN NATRIURETIC PEPTIDE: Brain Natriuretic Peptide: <4 pg/mL (ref <100)

## 2024-03-24 ENCOUNTER — Ambulatory Visit: Payer: Self-pay | Admitting: Internal Medicine

## 2024-03-24 ENCOUNTER — Other Ambulatory Visit: Payer: Self-pay | Admitting: Internal Medicine

## 2024-03-24 DIAGNOSIS — I119 Hypertensive heart disease without heart failure: Secondary | ICD-10-CM

## 2024-03-24 DIAGNOSIS — K219 Gastro-esophageal reflux disease without esophagitis: Secondary | ICD-10-CM

## 2024-03-24 DIAGNOSIS — I1A0 Resistant hypertension: Secondary | ICD-10-CM

## 2024-03-24 DIAGNOSIS — M170 Bilateral primary osteoarthritis of knee: Secondary | ICD-10-CM

## 2024-03-24 DIAGNOSIS — J301 Allergic rhinitis due to pollen: Secondary | ICD-10-CM

## 2024-03-24 DIAGNOSIS — Z23 Encounter for immunization: Secondary | ICD-10-CM

## 2024-03-24 DIAGNOSIS — R252 Cramp and spasm: Secondary | ICD-10-CM

## 2024-03-24 LAB — EXTRA SPECIMEN

## 2024-03-24 LAB — PTH, INTACT AND CALCIUM: PTH: 75 pg/mL (ref 16–77)

## 2024-03-24 NOTE — Telephone Encounter (Unsigned)
 Copied from CRM #[id number]. Topic: Clinical - Medication Refill >> Mar 24, 2024 10:29 AM Mesmerise C wrote: Medication:  pantoprazole  (PROTONIX ) 20 MG tablet hydrALAZINE  (APRESOLINE ) 25 MG tablet traMADol  (ULTRAM ) 50 MG tablet triamterene -hydrochlorothiazide  (DYAZIDE) 37.5-25 MG capsule montelukast  (SINGULAIR ) 10 MG tablet  Has the patient contacted their pharmacy? Yes (Agent: If no, request that the patient contact the pharmacy for the refill. If patient does not wish to contact the pharmacy document the reason why and proceed with request.) (Agent: If yes, when and what did the pharmacy advise?) Out of refills  This is the patient's preferred pharmacy:  [id number] [place], [place] - [address] AT [facility] OF [facility] & [address] [place] [address] Phone: [phone] Fax: [phone]  Is this the correct pharmacy for this prescription? Yes If no, delete pharmacy and type the correct one.   Has the prescription been filled recently? No  Is the patient out of the medication? No out of the triamterene -hydrochlorothiazide  (DYAZIDE) 37.5-25 MG capsule  Has the patient been seen for an appointment in the last year OR does the patient have an upcoming appointment? Yes  Can we respond through MyChart? Yes  Agent: Please be advised that Rx refills may take up to 3 business days. We ask that you follow-up with your pharmacy.

## 2024-03-25 ENCOUNTER — Telehealth: Payer: Self-pay

## 2024-03-25 NOTE — Telephone Encounter (Signed)
 Copied from CRM #[id number]. Topic: Clinical - Medication Question >> Mar 25, 2024 11:14 AM Alfonso HERO wrote: Reason for CRM: patient calling for refill status because [facility] keeps telling her that the refill has been denied by the provider. I informed her that  from I can see the request is still pending and to give it until the end of the day.

## 2024-03-27 MED ORDER — TRAMADOL HCL 50 MG PO TABS: 50.0000 mg | ORAL_TABLET | Freq: Four times a day (QID) | ORAL | 0 refills | Status: AC | PRN

## 2024-03-27 MED ORDER — MONTELUKAST SODIUM 10 MG PO TABS: 10.0000 mg | ORAL_TABLET | Freq: Every day | ORAL | 0 refills | Status: DC

## 2024-03-27 MED ORDER — PANTOPRAZOLE SODIUM 20 MG PO TBEC: 20.0000 mg | DELAYED_RELEASE_TABLET | Freq: Two times a day (BID) | ORAL | 1 refills | Status: DC

## 2024-03-27 MED ORDER — HYDRALAZINE HCL 25 MG PO TABS
ORAL_TABLET | ORAL | 0 refills | Status: AC
Start: 2024-03-27 — End: ?

## 2024-03-27 MED ORDER — TRIAMTERENE-HCTZ 37.5-25 MG PO CAPS: 1.0000 | ORAL_CAPSULE | Freq: Every day | ORAL | 1 refills | Status: AC

## 2024-03-30 NOTE — Telephone Encounter (Signed)
 All medication has been refilled.

## 2024-03-31 ENCOUNTER — Telehealth: Payer: Self-pay

## 2024-03-31 NOTE — Telephone Encounter (Signed)
 Copied from CRM [id number]. Topic: Clinical - Medication Question >> Mar 31, 2024 10:02 AM Rea BROCKS wrote: Reason for CRM: Patient was taking hydrALAZINE  (APRESOLINE ) 25 MG tablet at first three times/day, but it was making her blood pressure too low. Now, she is taking it two times/day.   However, the new script says again to take it three times/day. Patient would like to know does Dr. Joshua want her to take it twice daily or three daily again?   Pantoprazole  (PROTONIX ) 20 MG tablet was once daily and now it says twice daily.    Montelukast  (SINGULAIR ) 10 MG tablet states it had changed on Mychart but she doesnt see the change on the bottle when she's supposed to take it. But her main concerns are the hydralazine  and pantoprazole .   Patient is confused because Dr. Joshua didn't say anything to her about the change in medication taking at the last appointment. Patient would like clarification on when to take the medications.    [phone] (M)

## 2024-04-03 ENCOUNTER — Other Ambulatory Visit: Payer: Self-pay | Admitting: Internal Medicine

## 2024-04-03 DIAGNOSIS — K219 Gastro-esophageal reflux disease without esophagitis: Secondary | ICD-10-CM

## 2024-04-03 MED ORDER — PANTOPRAZOLE SODIUM 20 MG PO TBEC
20.0000 mg | DELAYED_RELEASE_TABLET | Freq: Two times a day (BID) | ORAL | 1 refills | Status: AC
Start: 2024-04-03 — End: ?

## 2024-04-03 NOTE — Telephone Encounter (Signed)
 Please advise

## 2024-04-03 NOTE — Telephone Encounter (Signed)
 Patient has been made aware. Gave a verbal understanding.

## 2024-04-03 NOTE — Telephone Encounter (Signed)
 She can take pantoprazole  and hydralazine  twice a day Singulair  is once a day

## 2024-04-21 ENCOUNTER — Other Ambulatory Visit: Payer: Self-pay | Admitting: Internal Medicine

## 2024-04-21 DIAGNOSIS — D500 Iron deficiency anemia secondary to blood loss (chronic): Secondary | ICD-10-CM

## 2024-04-23 ENCOUNTER — Ambulatory Visit (INDEPENDENT_AMBULATORY_CARE_PROVIDER_SITE_OTHER)

## 2024-04-23 DIAGNOSIS — I119 Hypertensive heart disease without heart failure: Secondary | ICD-10-CM

## 2024-04-23 DIAGNOSIS — R9431 Abnormal electrocardiogram [ECG] [EKG]: Secondary | ICD-10-CM | POA: Diagnosis not present

## 2024-04-23 LAB — ECHOCARDIOGRAM COMPLETE
Area-P 1/2: 4.21 cm2
S' Lateral: 2.87 cm

## 2024-04-28 ENCOUNTER — Other Ambulatory Visit: Payer: Self-pay | Admitting: Internal Medicine

## 2024-04-28 DIAGNOSIS — I1A0 Resistant hypertension: Secondary | ICD-10-CM

## 2024-04-28 DIAGNOSIS — E785 Hyperlipidemia, unspecified: Secondary | ICD-10-CM

## 2024-04-28 DIAGNOSIS — I119 Hypertensive heart disease without heart failure: Secondary | ICD-10-CM

## 2024-04-29 ENCOUNTER — Ambulatory Visit: Payer: Self-pay

## 2024-04-29 ENCOUNTER — Encounter: Payer: Self-pay | Admitting: Family

## 2024-04-29 ENCOUNTER — Ambulatory Visit: Admitting: Family

## 2024-04-29 VITALS — BP 110/68 | HR 104 | Temp 97.9°F | Ht 63.0 in | Wt 197.2 lb

## 2024-04-29 DIAGNOSIS — K2900 Acute gastritis without bleeding: Secondary | ICD-10-CM

## 2024-04-29 MED ORDER — ONDANSETRON 8 MG PO TBDP: 8.0000 mg | ORAL_TABLET | Freq: Three times a day (TID) | ORAL | 0 refills | Status: AC | PRN

## 2024-04-29 MED ORDER — ONDANSETRON HCL 8 MG PO TABS: 8.0000 mg | ORAL_TABLET | Freq: Three times a day (TID) | ORAL | 0 refills | Status: AC | PRN

## 2024-04-29 NOTE — Progress Notes (Signed)
 Patient ID: Kayla Lowe , female    DOB: 1955-10-12, 68 y.o.   MRN: [id number]  Chief Complaint  Patient presents with   Emesis    Pt c/o abdominal pain and vomiting, present since 2 am this morning.  Discussed the use of AI scribe software for clinical note transcription with the patient, who gave verbal consent to proceed.  History of Present Illness Kayla Lowe  is a 68 year old female who presents with acute onset vomiting.  She woke from sleep overnight with sudden vomiting and has had multiple episodes, with the last around noon today. She was exposed to a sick child and mother with similar symptoms 2 days ago. Emesis began as stomach contents and is now mostly bile. She has avoided eating due to concern it will worsen symptoms and is taking small amounts of water for hydration. She denies diarrhea, and her last bowel movement was normal yesterday. She has not changed or started medications recently. She takes pantoprazole , hydralazine , and carvedilol  but has not taken them today because of vomiting. She usually monitors her blood pressure at home.  Assessment & Plan Acute gastritis Acute vomiting likely due to acute gastritis. No new medications or dietary changes. Blood pressure stable. Normal bowel movements. - Prescribed 8 mg oral disintegrating tablet, up to three times daily, four to six hours apart, until symptoms improve. - Advised just hydration today with water, sugar-free Gatorade or Powerade, and/or ginger ale - Recommend starting tomorrow a bland diet (BRAT: bananas, rice, applesauce, toast), avoid dairy and spicy foods, start with small bites and increase as tolerated. - Instructed to monitor blood pressure at home, take her carvedilol  first if elevated, add normal dose regimen if tolerated. - Call office if symptoms are not improving or worsen.      Subjective:    Outpatient Medications Prior to Visit  Medication Sig Dispense Refill    acetaminophen  (TYLENOL ) 500 MG tablet Take 2 tablets (1,000 mg total) by mouth 4 (four) times daily. 30 tablet 0   aspirin  EC 81 MG tablet Take 81 mg by mouth daily. Swallow whole.     carvedilol  (COREG ) 6.25 MG tablet TAKE 1 TABLET(6.25 MG) BY MOUTH TWICE DAILY WITH A MEAL 180 tablet 1   cetirizine  (ZYRTEC ) 10 MG tablet Take 1 tablet (10 mg total) by mouth daily. 90 tablet 1   Cholecalciferol  (VITAMIN D ) 50 MCG (2000 UT) CAPS Take 1 capsule by mouth daily.     ezetimibe  (ZETIA ) 10 MG tablet TAKE 1 TABLET(10 MG) BY MOUTH DAILY 90 tablet 0   famotidine  (PEPCID ) 40 MG tablet TAKE 1 TABLET(40 MG) BY MOUTH AT BEDTIME 90 tablet 1   FEROSUL 325 (65 Fe) MG tablet TAKE 1 TABLET(325 MG) BY MOUTH DAILY WITH BREAKFAST 90 tablet 1   fluticasone  (FLONASE ) 50 MCG/ACT nasal spray SHAKE LIQUID AND USE 2 SPRAYS IN EACH NOSTRIL DAILY 48 g 1   hydrALAZINE  (APRESOLINE ) 25 MG tablet TAKE 1 TABLET(25 MG) BY MOUTH THREE TIMES DAILY 270 tablet 0   hydrALAZINE  (APRESOLINE ) 25 MG tablet TAKE 1 TABLET(25 MG) BY MOUTH THREE TIMES DAILY 270 tablet 0   letrozole  (FEMARA ) 2.5 MG tablet Take 1 tablet (2.5 mg total) by mouth daily. 90 tablet 0   montelukast  (SINGULAIR ) 10 MG tablet TAKE 1 TABLET BY MOUTH EVERY NIGHT AT BEDTIME 90 tablet 0   montelukast  (SINGULAIR ) 10 MG tablet Take 1 tablet (10 mg total) by mouth at bedtime. 90 tablet 0   pantoprazole  (PROTONIX ) 20 MG tablet TAKE  1 TABLET(20 MG) BY MOUTH DAILY 90 tablet 1   pantoprazole  (PROTONIX ) 20 MG tablet Take 1 tablet (20 mg total) by mouth 2 (two) times daily. 180 tablet 1   pravastatin  (PRAVACHOL ) 40 MG tablet TAKE 1 TABLET(40 MG) BY MOUTH DAILY 90 tablet 1   tiZANidine  (ZANAFLEX ) 2 MG tablet TAKE 1 TABLET(2 MG) BY MOUTH EVERY 8 HOURS AS NEEDED FOR MUSCLE SPASMS 270 tablet 0   traMADol  (ULTRAM ) 50 MG tablet Take 1 tablet (50 mg total) by mouth every 6 (six) hours as needed. 270 tablet 0   triamterene -hydrochlorothiazide  (DYAZIDE) 37.5-25 MG capsule Take 1 each (1 capsule  total) by mouth daily. 90 capsule 1   No facility-administered medications prior to visit.   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   Osteoarthritis    Past Surgical History:  Procedure Laterality Date   ABDOMINAL HYSTERECTOMY  1987   BREAST BIOPSY Left 05/28/2023   US  LT BREAST BX W LOC DEV EA ADD LESION IMG BX SPEC US  GUIDE 05/28/2023 [facility]   BREAST BIOPSY Left 05/28/2023   US  LT BREAST BX W LOC DEV 1ST LESION IMG BX SPEC US  GUIDE 05/28/2023 [facility]   BREAST BIOPSY Left 07/02/2023   US  LT RADIOACTIVE SEED EA ADD LESION 07/02/2023 [facility]   BREAST BIOPSY Left 07/02/2023   US  LT RADIOACTIVE SEED LOC 07/02/2023 [facility]   BREAST LUMPECTOMY WITH RADIOACTIVE SEED AND SENTINEL LYMPH NODE BIOPSY Left 07/03/2023   Procedure: RADIOACTIVE SEED GUIDED LEFT BREAST LUMPECTOMY x2 AND LEFT SENTINEL NODE BIOPSY;  Surgeon: Vernetta Berg, MD;  Location: [facility];  Service: General;  Laterality: Left;  LMA   BREAST REDUCTION SURGERY Bilateral 08/16/2023   Procedure: MAMMOPLASTY, REDUCTION;  Surgeon: Waddell Leonce NOVAK, MD;  Location: [facility];  Service: Plastics;  Laterality: Bilateral;   LAPAROTOMY N/A 02/04/2023   Procedure: EXPLORATORY LAPAROTOMY, WITH SMALL BOWEL RESECTION;  Surgeon: Vernetta Berg, MD;  Location: [facility];  Service: General;  Laterality: N/A;   Allergies[1]    Objective:    Physical Exam Vitals and nursing note reviewed.  Constitutional:      Appearance: Normal appearance. She is ill-appearing.  Cardiovascular:     Rate and Rhythm: Normal rate and regular rhythm.  Pulmonary:     Effort: Pulmonary effort is normal.     Breath sounds: Normal breath sounds.   Musculoskeletal:        General: Normal range of motion.  Skin:    General: Skin is warm and dry.  Neurological:     Mental Status: She is alert.  Psychiatric:        Mood and Affect: Mood normal.        Behavior: Behavior normal.    BP 110/68 (BP Location: Left Arm, Patient Position: Sitting, Cuff Size: Large)   Pulse (!) 104   Temp 97.9 F (36.6 C) (Temporal)   Ht 5' 3 (1.6 m)   Wt 197 lb 3.2 oz (89.4 kg)   SpO2 94%   BMI 34.93 kg/m  Wt Readings from Last 3 Encounters:  04/29/24 197 lb 3.2 oz (89.4 kg)  03/18/24 200 lb (90.7 kg)  02/28/24 205 lb 9.6  oz (93.3 kg)      Aracelia Brinson, NP     [1]  Allergies Allergen Reactions   Cozaar  [Losartan  Potassium] Other (See Comments)    Caused cough and sweats. Tingling in legs.   Lisinopril  Cough   Statins Other (See Comments)    myalgias

## 2024-04-29 NOTE — Telephone Encounter (Signed)
 FYI Only or Action Required?: FYI only for provider: appointment scheduled on 04/29/24.  Patient was last seen in primary care on 03/18/2024 by [name], MD.  Called Nurse Triage reporting Abdominal Pain and Vomiting.  Symptoms began today.  Interventions attempted: Nothing.  Symptoms are: unchanged.  Triage Disposition: See Physician Within 24 Hours  Patient/caregiver understands and will follow disposition?: Yes   Copied from CRM #[id number]. 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 She stayed there all day and when she was about to go when the mom stated that she wasnt feeling well. Patient said that around 4:30 this morning she has been throwing up every hour, has stomach pain from throwing up, and she is not feeling well. Reason for Disposition  [1] MILD or MODERATE vomiting AND [2] present > 48 hours (2 days)  (Exception: Mild vomiting with associated diarrhea.)  Answer Assessment - Initial Assessment Questions No available appts with pcp, scheduled 04/29/24. Advised call back or ED/911 if symptoms worsen. Patient verbalized understanding.  1. VOMITING SEVERITY: How many times have you vomited in the past 24 hours?      6-7x 2. ONSET: When did the vomiting begin?      Today at 0430 3. FLUIDS: What fluids or food have you vomited up today? Have you been able to keep any fluids down?     Water, just now able to, kept down 4. ABDOMEN PAIN: Are your having any abdomen pain? If Yes : How bad is it and what does it feel like? (e.g., crampy, dull, intermittent, constant)      Denies abd pain 5. DIARRHEA: Is there any diarrhea? If Yes, ask: How many times today?      no 6. CONTACTS: Is there anyone else in the family with the same symptoms?      yes 7.  CAUSE: What do you think is causing your vomiting?     sick 8. HYDRATION STATUS: Any signs of dehydration? (e.g., dry mouth [not only dry lips], too weak to stand) When did you last urinate?     denies 9. OTHER SYMPTOMS: Do you have any other symptoms? (e.g., fever, headache, vertigo, vomiting blood or coffee grounds, recent head injury)     Denies HA, dizzines  Protocols used: Vomiting-A-AH

## 2024-04-29 NOTE — Patient Instructions (Signed)
 It was very nice to see you today!   I have sent over a prescription for Zofran  to take every 8 hours as needed for your nausea and vomiting. Be sure you are hydrating, shoot for 8 cups of 8oz liquids in a day, this is very important, mostly water, but G2 gatorade or Powerade zero are fine but have no sugar which is better for you.   Call back if your symptoms are not improving.       PLEASE NOTE:  If you had any lab tests please let us  know if you have not heard back within a few days. You may see your results on MyChart before we have a chance to review them but we will give you a call once they are reviewed by us . If we ordered any referrals today, please let us  know if you have not heard from their office within the next week.

## 2024-05-04 ENCOUNTER — Ambulatory Visit: Attending: Surgery

## 2024-05-04 VITALS — Wt 198.4 lb

## 2024-05-04 DIAGNOSIS — Z483 Aftercare following surgery for neoplasm: Secondary | ICD-10-CM | POA: Insufficient documentation

## 2024-05-04 NOTE — Therapy (Signed)
 " OUTPATIENT PHYSICAL THERAPY SOZO SCREENING NOTE   Patient Name: Kayla Lowe  MRN: [id number] DOB:1956/05/04, 68 y.o., female Today's Date: 05/04/2024  PCP: [name], MD REFERRING PROVIDER: Vernetta Berg, MD   PT End of Session - 05/04/24 [phone]     Visit Number 2   # unchanged due to screen only   PT Start Time 0815    PT Stop Time 0819    PT Time Calculation (min) 4 min    Activity Tolerance Patient tolerated treatment well    Behavior During Therapy [facility] for tasks assessed/performed          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   Osteoarthritis    Past Surgical History:  Procedure Laterality Date   ABDOMINAL HYSTERECTOMY  1987   BREAST BIOPSY Left 05/28/2023   US  LT BREAST BX W LOC DEV EA ADD LESION IMG BX SPEC US  GUIDE 05/28/2023 [facility]   BREAST BIOPSY Left 05/28/2023   US  LT BREAST BX W LOC DEV 1ST LESION IMG BX SPEC US  GUIDE 05/28/2023 [facility]   BREAST BIOPSY Left 07/02/2023   US  LT RADIOACTIVE SEED EA ADD LESION 07/02/2023 [facility]   BREAST BIOPSY Left 07/02/2023   US  LT RADIOACTIVE SEED LOC 07/02/2023 [facility]   BREAST LUMPECTOMY WITH RADIOACTIVE SEED AND SENTINEL LYMPH NODE BIOPSY Left 07/03/2023   Procedure: RADIOACTIVE SEED GUIDED LEFT BREAST LUMPECTOMY x2 AND LEFT SENTINEL NODE BIOPSY;  Surgeon: Vernetta Berg, MD;  Location: [facility];  Service: General;  Laterality: Left;  LMA   BREAST REDUCTION SURGERY Bilateral 08/16/2023   Procedure: MAMMOPLASTY, REDUCTION;  Surgeon: Waddell Leonce NOVAK, MD;  Location: [facility];  Service: Plastics;  Laterality: Bilateral;   LAPAROTOMY N/A 02/04/2023   Procedure: EXPLORATORY LAPAROTOMY, WITH SMALL BOWEL  RESECTION;  Surgeon: Vernetta Berg, MD;  Location: [facility];  Service: General;  Laterality: N/A;   Patient Active Problem List   Diagnosis Date Noted   Need for immunization against influenza 03/18/2024   Hypercalcemia 03/18/2024   Abnormal electrocardiogram (ECG) (EKG) 03/18/2024   Vaginal polyp 09/11/2023   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   Malignant neoplasm of upper-inner quadrant of left breast in female, estrogen receptor positive (HCC) 06/14/2023   Iron deficiency anemia due to chronic blood loss 03/13/2023   Nocturnal muscle cramps 10/17/2022   LVH (left ventricular hypertrophy) due to hypertensive disease, without heart failure 01/17/2022   Encounter for general adult medical examination with abnormal findings 01/09/2021   Visit for screening mammogram 07/12/2020   Primary osteoarthritis of both knees 07/11/2020   Intrinsic eczema 03/14/2020   Mild persistent asthma without complication 03/14/2020   Vitamin D  deficiency disease 09/18/2019   Familial hypercholesteremia 09/16/2019   Morton's neuroma of left foot 07/20/2019   Gastroesophageal reflux disease without esophagitis 02/09/2019   Osteopenia of neck of right femur 02/09/2019   Seasonal allergic rhinitis due to pollen 08/12/2017   Elevated rheumatoid factor 02/06/2016   Dyslipidemia, goal LDL below 130 09/10/2013    REFERRING DIAG: left breast cancer at risk for lymphedema  THERAPY DIAG:  Aftercare following surgery for neoplasm  PERTINENT HISTORY: Pt has two small areas in the left breast measuring  1cm and 7mm. ER/PR positive, HER2 negative. lumpectomy with SLNB on 07/03/23 with 5 negative nodes removed.  with oncotype of 28 - will be starting chemotherapy with TC every 21 days x 4  and then pt will have radiation. Also new dx of marginal zone lymphoma - just monitoring this.  Breast reduction scheduled for 08/16/23.   PRECAUTIONS: left UE Lymphedema risk, None  SUBJECTIVE: Pt returns for her 3 month L-Dex  screen.   PAIN:  Are you having pain? No  SOZO SCREENING: Patient was assessed today using the SOZO machine to determine the lymphedema index score. This was compared to her baseline score. It was determined that she is within the recommended range when compared to her baseline and no further action is needed at this time. She will continue SOZO screenings. These are done every 3 months for 2 years post operatively followed by every 6 months for 2 years, and then annually.   L-DEX FLOWSHEETS - 05/04/24 0800       L-DEX LYMPHEDEMA SCREENING   Measurement Type Unilateral    L-DEX MEASUREMENT EXTREMITY Upper Extremity    POSITION  Standing    DOMINANT SIDE Left    At Risk Side Left    BASELINE SCORE (UNILATERAL) -8    L-DEX SCORE (UNILATERAL) -5.8    VALUE CHANGE (UNILAT) 2.2         P: Cont every 3 month L-Dex screens until 07/02/2025, then can transition to 6 months until 10/2027.    Aden Berwyn Caldron, PTA 05/04/2024, 8:21 AM      "

## 2024-05-20 ENCOUNTER — Ambulatory Visit: Payer: Self-pay | Admitting: General Surgery

## 2024-05-20 NOTE — Pre-Procedure Instructions (Signed)
 Surgical Instructions   Your procedure is scheduled on May 27, 2024. Report to [facility] Main Entrance A at 6:30 A.M., then check in with the Admitting office. Any questions or running late day of surgery: call [phone]  Questions prior to your surgery date: call [phone], Monday-Friday, 8am-4pm. If you experience any cold or flu symptoms such as cough, fever, chills, shortness of breath, etc. between now and your scheduled surgery, please notify us  at the above number.     Remember:  Do not eat after midnight the night before your surgery   You may drink clear liquids until 5:30AM the morning of your surgery.   Clear liquids allowed are: Water, Non-Citrus Juices (without pulp), Carbonated Beverages, Clear Tea (no milk, honey, etc.), Black Coffee Only (NO MILK, CREAM OR POWDERED CREAMER of any kind), and Gatorade.    Take these medicines the morning of surgery with A SIP OF WATER: acetaminophen  (TYLENOL )  carvedilol  (COREG )  cetirizine  (ZYRTEC )  ezetimibe  (ZETIA )  fluticasone  (FLONASE ) 50 MCG/ACT nasal spray  hydrALAZINE  (APRESOLINE )  letrozole  (FEMARA )  pantoprazole  (PROTONIX )  pravastatin  (PRAVACHOL )   May take these medicines IF NEEDED: ondansetron  (ZOFRAN )  tiZANidine  (ZANAFLEX )  traMADol  (ULTRAM )   Follow your surgeon's instructions on when to stop taking Aspirin . If no instructions were given, please contact your surgeon's office.   One week prior to surgery, STOP taking any  Aleve , Naproxen , Ibuprofen , Motrin , Advil , Goody's, BC's, all herbal medications, fish oil, and non-prescription vitamins.                     Do NOT Smoke (Tobacco/Vaping) for 24 hours prior to your procedure.  If you use a CPAP at night, you may bring your mask/headgear for your overnight stay.   You will be asked to remove any contacts, glasses, piercing's, hearing aid's, dentures/partials prior to surgery. Please bring cases for these items if needed.    Patients discharged  the day of surgery will not be allowed to drive home, and someone needs to stay with them for 24 hours.  SURGICAL WAITING ROOM VISITATION Patients may have no more than 2 support people in the waiting area - these visitors may rotate.   Pre-op nurse will coordinate an appropriate time for 1 ADULT support person, who may not rotate, to accompany patient in pre-op.  Children under the age of 64 must have an adult with them who is not the patient and must remain in the main waiting area with an adult.  If the patient needs to stay at the hospital during part of their recovery, the visitor guidelines for inpatient rooms apply.  Please refer to the [facility] website for the visitor guidelines for any additional information.   If you received a COVID test during your pre-op visit  it is requested that you wear a mask when out in public, stay away from anyone that may not be feeling well and notify your surgeon if you develop symptoms. If you have been in contact with anyone that has tested positive in the last 10 days please notify you surgeon.      Pre-operative CHG Bathing Instructions   You can play a key role in reducing the risk of infection after surgery. Your skin needs to be as free of germs as possible. You can reduce the number of germs on your skin by washing with CHG (chlorhexidine  gluconate) soap before surgery. CHG is an antiseptic soap that kills germs and continues to kill germs even after  washing.   DO NOT use if you have an allergy to chlorhexidine /CHG or antibacterial soaps. If your skin becomes reddened or irritated, stop using the CHG and notify one of our RNs at [phone].              TAKE A SHOWER THE NIGHT BEFORE SURGERY   Please keep in mind the following:  DO NOT shave, including legs and underarms, 48 hours prior to surgery.   You may shave your face before/day of surgery.  Place clean sheets on your bed the night before surgery Use a clean washcloth (not used  since being washed) for shower. DO NOT sleep with pet's night before surgery.  CHG Shower Instructions:  Wash your face and private area with normal soap. If you choose to wash your hair, wash first with your normal shampoo.  After you use shampoo/soap, rinse your hair and body thoroughly to remove shampoo/soap residue.  Turn the water OFF and apply half the bottle of CHG soap to a CLEAN washcloth.  Apply CHG soap ONLY FROM YOUR NECK DOWN TO YOUR TOES (washing for 3-5 minutes)  DO NOT use CHG soap on face, private areas, open wounds, or sores.  Pay special attention to the area where your surgery is being performed.  If you are having back surgery, having someone wash your back for you may be helpful. Wait 2 minutes after CHG soap is applied, then you may rinse off the CHG soap.  Pat dry with a clean towel  Put on clean pajamas    Additional instructions for the day of surgery: If you choose, you may shower the morning of surgery with an antibacterial soap.  DO NOT APPLY any lotions, deodorants, cologne, or perfumes.   Do not wear jewelry or makeup Do not wear nail polish, gel polish, artificial nails, or any other type of covering on natural nails (fingers and toes) Do not bring valuables to the hospital. [facility] is not responsible for valuables/personal belongings. Put on clean/comfortable clothes.  Please brush your teeth.  Ask your nurse before applying any prescription medications to the skin.

## 2024-05-21 ENCOUNTER — Encounter (HOSPITAL_COMMUNITY)
Admission: RE | Admit: 2024-05-21 | Discharge: 2024-05-21 | Disposition: A | Discharge status: Home / Self Care | Source: Ambulatory Visit | Attending: General Surgery | Admitting: General Surgery

## 2024-05-21 ENCOUNTER — Other Ambulatory Visit: Payer: Self-pay

## 2024-05-21 ENCOUNTER — Encounter (HOSPITAL_COMMUNITY): Payer: Self-pay

## 2024-05-21 VITALS — BP 130/54 | HR 73 | Temp 97.8°F | Resp 17 | Ht 63.5 in | Wt 200.9 lb

## 2024-05-21 DIAGNOSIS — Z01812 Encounter for preprocedural laboratory examination: Secondary | ICD-10-CM | POA: Diagnosis present

## 2024-05-21 DIAGNOSIS — I1A0 Resistant hypertension: Secondary | ICD-10-CM | POA: Diagnosis not present

## 2024-05-21 DIAGNOSIS — I10 Essential (primary) hypertension: Secondary | ICD-10-CM | POA: Diagnosis not present

## 2024-05-21 DIAGNOSIS — I119 Hypertensive heart disease without heart failure: Secondary | ICD-10-CM

## 2024-05-21 DIAGNOSIS — Z01818 Encounter for other preprocedural examination: Secondary | ICD-10-CM

## 2024-05-21 HISTORY — DX: Personal history of irradiation: Z92.3

## 2024-05-21 LAB — BASIC METABOLIC PANEL WITH GFR
Anion gap: 10 (ref 5–15)
BUN: 11 mg/dL (ref 8–23)
CO2: 26 mmol/L (ref 22–32)
Calcium: 11 mg/dL — ABNORMAL HIGH (ref 8.9–10.3)
Chloride: 104 mmol/L (ref 98–111)
Creatinine, Ser: 1.13 mg/dL — ABNORMAL HIGH (ref 0.44–1.00)
GFR, Estimated: 52 mL/min — ABNORMAL LOW
Glucose, Bld: 110 mg/dL — ABNORMAL HIGH (ref 70–99)
Potassium: 3.8 mmol/L (ref 3.5–5.1)
Sodium: 140 mmol/L (ref 135–145)

## 2024-05-21 LAB — CBC
HCT: 39 % (ref 36.0–46.0)
Hemoglobin: 12.6 g/dL (ref 12.0–15.0)
MCH: 32.1 pg (ref 26.0–34.0)
MCHC: 32.3 g/dL (ref 30.0–36.0)
MCV: 99.2 fL (ref 80.0–100.0)
Platelets: 302 K/uL (ref 150–400)
RBC: 3.93 MIL/uL (ref 3.87–5.11)
RDW: 12.9 % (ref 11.5–15.5)
WBC: 4.4 K/uL (ref 4.0–10.5)
nRBC: 0 % (ref 0.0–0.2)

## 2024-05-21 NOTE — Progress Notes (Signed)
 PCP - Dr. Debby Molt Cardiologist - denies   Chest x-ray - n/a EKG - 03/19/24 Stress Test - denies ECHO - 04/23/24 Cardiac Cath - denies  Sleep Study - denies CPAP -   Fasting Blood Sugar - denies  Checks Blood Sugar _____ times a day  Last dose of GLP1 agonist-  denies GLP1 instructions:   Blood Thinner Instructions: n/a Aspirin  Instructions: Pt says she will stop starting Today  ERAS Protcol - 2 night before 1 morning of surgery PRE-SURGERY Ensure or G2- 3 pre-surgery ensures provided per order  COVID TEST- n/a   Patient denies shortness of breath, fever, cough and chest pain at PAT appointment

## 2024-05-21 NOTE — Pre-Procedure Instructions (Signed)
 Surgical Instructions   Your procedure is scheduled on May 27, 2024. Report to [facility] Main Entrance A at 6:30 A.M., then check in with the Admitting office. Any questions or running late day of surgery: call [phone]  Questions prior to your surgery date: call [phone], Monday-Friday, 8am-4pm. If you experience any cold or flu symptoms such as cough, fever, chills, shortness of breath, etc. between now and your scheduled surgery, please notify us  at the above number.     Remember: The NIGHT BEFORE SURGERY, PLEASE DRINK A TOTAL OF 2 PRE-SURGERY ENSURE DRINKS as instructed by your surgeon.   Do not eat after midnight the night before your surgery  You may drink clear liquids until 5:30AM the morning of your surgery.   Clear liquids allowed are: Water, Non-Citrus Juices (without pulp), Carbonated Beverages, Clear Tea (no milk, honey, etc.), Black Coffee Only (NO MILK, CREAM OR POWDERED CREAMER of any kind), and Gatorade.\  Patient Instructions  The night before surgery:  No food after midnight. ONLY clear liquids after midnight   The day of surgery (if you do NOT have diabetes):  Drink ONE (1) Pre-Surgery Clear Ensure by 5:30am the morning of surgery. Drink in one sitting. Do not sip.  This drink was given to you during your hospital  pre-op appointment visit.  Nothing else to drink after completing the  Pre-Surgery Clear Ensure.          If you have questions, please contact your surgeons office.    Take these medicines the morning of surgery with A SIP OF WATER: acetaminophen  (TYLENOL )  carvedilol  (COREG )  cetirizine  (ZYRTEC )  ezetimibe  (ZETIA )  fluticasone  (FLONASE ) 50 MCG/ACT nasal spray  hydrALAZINE  (APRESOLINE )  pantoprazole  (PROTONIX )   May take these medicines IF NEEDED: ondansetron  (ZOFRAN )  tiZANidine  (ZANAFLEX )  traMADol  (ULTRAM ) Fluticasone  (Flonase ) nasal spray   Follow your surgeon's instructions on when to stop taking Aspirin . If no  instructions were given, please contact your surgeon's office.   One week prior to surgery, STOP taking any  Aleve , Naproxen , Ibuprofen , Motrin , Advil , Goody's, BC's, all herbal medications, fish oil, and non-prescription vitamins. This includes your Voltaren Gel.                     Do NOT Smoke (Tobacco/Vaping) for 24 hours prior to your procedure.  If you use a CPAP at night, you may bring your mask/headgear for your overnight stay.   You will be asked to remove any contacts, glasses, piercing's, hearing aid's, dentures/partials prior to surgery. Please bring cases for these items if needed.    Patients discharged the day of surgery will not be allowed to drive home, and someone needs to stay with them for 24 hours.  SURGICAL WAITING ROOM VISITATION Patients may have no more than 2 support people in the waiting area - these visitors may rotate.   Pre-op nurse will coordinate an appropriate time for 1 ADULT support person, who may not rotate, to accompany patient in pre-op.  Children under the age of 23 must have an adult with them who is not the patient and must remain in the main waiting area with an adult.  If the patient needs to stay at the hospital during part of their recovery, the visitor guidelines for inpatient rooms apply.  Please refer to the [facility] website for the visitor guidelines for any additional information.   If you received a COVID test during your pre-op visit  it is requested that you wear  a mask when out in public, stay away from anyone that may not be feeling well and notify your surgeon if you develop symptoms. If you have been in contact with anyone that has tested positive in the last 10 days please notify you surgeon.      Pre-operative CHG Bathing Instructions   You can play a key role in reducing the risk of infection after surgery. Your skin needs to be as free of germs as possible. You can reduce the number of germs on your skin by washing with  CHG (chlorhexidine  gluconate) soap before surgery. CHG is an antiseptic soap that kills germs and continues to kill germs even after washing.   DO NOT use if you have an allergy to chlorhexidine /CHG or antibacterial soaps. If your skin becomes reddened or irritated, stop using the CHG and notify one of our RNs at [phone].              TAKE A SHOWER THE NIGHT BEFORE SURGERY   Please keep in mind the following:  DO NOT shave, including legs and underarms, 48 hours prior to surgery.   You may shave your face before/day of surgery.  Place clean sheets on your bed the night before surgery Use a clean washcloth (not used since being washed) for shower. DO NOT sleep with pet's night before surgery.  CHG Shower Instructions:  Wash your face and private area with normal soap. If you choose to wash your hair, wash first with your normal shampoo.  After you use shampoo/soap, rinse your hair and body thoroughly to remove shampoo/soap residue.  Turn the water OFF and apply half the bottle of CHG soap to a CLEAN washcloth.  Apply CHG soap ONLY FROM YOUR NECK DOWN TO YOUR TOES (washing for 3-5 minutes)  DO NOT use CHG soap on face, private areas, open wounds, or sores.  Pay special attention to the area where your surgery is being performed.  If you are having back surgery, having someone wash your back for you may be helpful. Wait 2 minutes after CHG soap is applied, then you may rinse off the CHG soap.  Pat dry with a clean towel  Put on clean pajamas    Additional instructions for the day of surgery: If you choose, you may shower the morning of surgery with an antibacterial soap.  DO NOT APPLY any lotions, deodorants, cologne, or perfumes.   Do not wear jewelry or makeup Do not wear nail polish, gel polish, artificial nails, or any other type of covering on natural nails (fingers and toes) Do not bring valuables to the hospital. [facility] is not responsible for valuables/personal  belongings. Put on clean/comfortable clothes.  Please brush your teeth.  Ask your nurse before applying any prescription medications to the skin.

## 2024-05-26 ENCOUNTER — Other Ambulatory Visit: Payer: Self-pay | Admitting: Adult Health

## 2024-05-26 DIAGNOSIS — C50212 Malignant neoplasm of upper-inner quadrant of left female breast: Secondary | ICD-10-CM

## 2024-05-26 NOTE — Anesthesia Preprocedure Evaluation (Addendum)
 "                                  Anesthesia Evaluation  Patient identified by MRN, date of birth, ID band Patient awake    Reviewed: Allergy & Precautions, NPO status , Patient's Chart, lab work & pertinent test results  Airway Mallampati: II  TM Distance: >3 FB Neck ROM: Full    Dental   Pulmonary asthma    breath sounds clear to auscultation       Cardiovascular hypertension, Pt. on medications and Pt. on home beta blockers +CHF   Rhythm:Regular Rate:Normal  IMPRESSIONS     1. Left ventricular ejection fraction, by estimation, is 45 to 50%. The  left ventricle has mildly decreased function. The left ventricle  demonstrates regional wall motion abnormalities (see scoring  diagram/findings for description). There is mild left  ventricular hypertrophy. Left ventricular diastolic parameters are  consistent with Grade I diastolic dysfunction (impaired relaxation). The  average left ventricular global longitudinal strain is -12.2 %. The global  longitudinal strain is abnormal.   2. Right ventricular systolic function is normal. The right ventricular  size is normal. Mildly increased right ventricular wall thickness.   3. The mitral valve is normal in structure. No evidence of mitral valve  regurgitation. No evidence of mitral stenosis.   4. The aortic valve was not well visualized. Aortic valve regurgitation  is not visualized. No aortic stenosis is present.   5. The inferior vena cava is normal in size with greater than 50%  respiratory variability, suggesting right atrial pressure of 3 mmHg.      Neuro/Psych  Neuromuscular disease    GI/Hepatic Neg liver ROS,GERD  Medicated,,  Endo/Other  negative endocrine ROS    Renal/GU Renal InsufficiencyRenal disease     Musculoskeletal  (+) Arthritis ,    Abdominal   Peds  Hematology  (+) Blood dyscrasia, anemia   Anesthesia Other Findings   Reproductive/Obstetrics                               Lab Results  Component Value Date   WBC 4.4 05/21/2024   HGB 12.6 05/21/2024   HCT 39.0 05/21/2024   MCV 99.2 05/21/2024   PLT 302 05/21/2024   Lab Results  Component Value Date   NA 140 05/21/2024   CL 104 05/21/2024   K 3.8 05/21/2024   CO2 26 05/21/2024   BUN 11 05/21/2024   CREATININE 1.13 (H) 05/21/2024   GFRNONAA 52 (L) 05/21/2024   CALCIUM  11.0 (H) 05/21/2024   PHOS 2.9 03/18/2024   ALBUMIN  4.2 02/28/2024   GLUCOSE 110 (H) 05/21/2024    Anesthesia Physical Anesthesia Plan  ASA: 3  Anesthesia Plan: General   Post-op Pain Management: Tylenol  PO (pre-op)* and Ketamine  IV*   Induction: Intravenous  PONV Risk Score and Plan: 4 or greater and Ondansetron , Dexamethasone , Propofol  infusion, Scopolamine patch - Pre-op and Treatment may vary due to age or medical condition  Airway Management Planned: Oral ETT  Additional Equipment: None  Intra-op Plan:   Post-operative Plan: Extubation in OR  Informed Consent: I have reviewed the patients History and Physical, chart, labs and discussed the procedure including the risks, benefits and alternatives for the proposed anesthesia with the patient or authorized representative who has indicated his/her understanding and acceptance.     Dental advisory given  Plan Discussed with: CRNA  Anesthesia Plan Comments:          Anesthesia Quick Evaluation  "

## 2024-05-27 ENCOUNTER — Encounter (HOSPITAL_COMMUNITY): Admitting: Anesthesiology

## 2024-05-27 ENCOUNTER — Observation Stay (HOSPITAL_COMMUNITY)
Admission: RE | Admit: 2024-05-27 | Discharge: 2024-05-29 | Disposition: A | Discharge status: Home / Self Care | Attending: General Surgery | Admitting: General Surgery

## 2024-05-27 ENCOUNTER — Ambulatory Visit (HOSPITAL_BASED_OUTPATIENT_CLINIC_OR_DEPARTMENT_OTHER): Admitting: Anesthesiology

## 2024-05-27 ENCOUNTER — Encounter (HOSPITAL_COMMUNITY): Payer: Self-pay | Admitting: General Surgery

## 2024-05-27 ENCOUNTER — Encounter (HOSPITAL_COMMUNITY): Admission: RE | Payer: Self-pay | Source: Home / Self Care

## 2024-05-27 DIAGNOSIS — N189 Chronic kidney disease, unspecified: Secondary | ICD-10-CM | POA: Diagnosis not present

## 2024-05-27 DIAGNOSIS — I110 Hypertensive heart disease with heart failure: Secondary | ICD-10-CM

## 2024-05-27 DIAGNOSIS — J45909 Unspecified asthma, uncomplicated: Secondary | ICD-10-CM | POA: Diagnosis not present

## 2024-05-27 DIAGNOSIS — K432 Incisional hernia without obstruction or gangrene: Principal | ICD-10-CM | POA: Insufficient documentation

## 2024-05-27 DIAGNOSIS — I509 Heart failure, unspecified: Secondary | ICD-10-CM | POA: Diagnosis not present

## 2024-05-27 DIAGNOSIS — I130 Hypertensive heart and chronic kidney disease with heart failure and stage 1 through stage 4 chronic kidney disease, or unspecified chronic kidney disease: Secondary | ICD-10-CM | POA: Insufficient documentation

## 2024-05-27 DIAGNOSIS — Z87891 Personal history of nicotine dependence: Secondary | ICD-10-CM | POA: Insufficient documentation

## 2024-05-27 DIAGNOSIS — Z79899 Other long term (current) drug therapy: Secondary | ICD-10-CM | POA: Diagnosis not present

## 2024-05-27 DIAGNOSIS — Z8719 Personal history of other diseases of the digestive system: Principal | ICD-10-CM

## 2024-05-27 HISTORY — PX: INSERTION OF MESH: SHX5868

## 2024-05-27 HISTORY — PX: VENTRAL HERNIA REPAIR: SHX424

## 2024-05-27 MED ORDER — ONDANSETRON HCL 4 MG/2ML IJ SOLN: 4.0000 mg | Freq: Four times a day (QID) | INTRAMUSCULAR | Status: DC | PRN

## 2024-05-27 MED ORDER — KETAMINE HCL 50 MG/5ML IJ SOSY
PREFILLED_SYRINGE | INTRAMUSCULAR | Status: AC
  Filled 2024-05-27: qty 5

## 2024-05-27 MED ORDER — LIDOCAINE 2% (20 MG/ML) 5 ML SYRINGE
INTRAMUSCULAR | Status: DC | PRN
  Administered 2024-05-27: 60 mg via INTRAVENOUS

## 2024-05-27 MED ORDER — CARVEDILOL 6.25 MG PO TABS
6.2500 mg | ORAL_TABLET | Freq: Two times a day (BID) | ORAL | Status: DC
  Administered 2024-05-27 – 2024-05-29 (×4): 6.25 mg via ORAL
  Filled 2024-05-27 (×4): qty 1

## 2024-05-27 MED ORDER — CHLORHEXIDINE GLUCONATE 0.12 % MT SOLN
15.0000 mL | Freq: Once | OROMUCOSAL | Status: AC
  Administered 2024-05-27: 15 mL via OROMUCOSAL

## 2024-05-27 MED ORDER — PANTOPRAZOLE SODIUM 20 MG PO TBEC
20.0000 mg | DELAYED_RELEASE_TABLET | Freq: Two times a day (BID) | ORAL | Status: DC
  Administered 2024-05-27 – 2024-05-29 (×4): 20 mg via ORAL
  Filled 2024-05-27 (×4): qty 1

## 2024-05-27 MED ORDER — SUGAMMADEX SODIUM 200 MG/2ML IV SOLN
INTRAVENOUS | Status: DC | PRN
  Administered 2024-05-27: 200 mg via INTRAVENOUS

## 2024-05-27 MED ORDER — ONDANSETRON 4 MG PO TBDP: 4.0000 mg | ORAL_TABLET | Freq: Four times a day (QID) | ORAL | Status: DC | PRN

## 2024-05-27 MED ORDER — OXYCODONE HCL 5 MG/5ML PO SOLN: 5.0000 mg | Freq: Once | ORAL | Status: DC | PRN

## 2024-05-27 MED ORDER — 0.9 % SODIUM CHLORIDE (POUR BTL) OPTIME
TOPICAL | Status: DC | PRN
  Administered 2024-05-27: 1000 mL

## 2024-05-27 MED ORDER — FENTANYL CITRATE (PF) 100 MCG/2ML IJ SOLN
INTRAMUSCULAR | Status: AC
  Filled 2024-05-27: qty 2

## 2024-05-27 MED ORDER — ENSURE PRE-SURGERY PO LIQD: 592.0000 mL | Freq: Once | ORAL | Status: DC

## 2024-05-27 MED ORDER — BUPIVACAINE LIPOSOME 1.3 % IJ SUSP
INTRAMUSCULAR | Status: AC
  Filled 2024-05-27: qty 20

## 2024-05-27 MED ORDER — METHOCARBAMOL 500 MG PO TABS
500.0000 mg | ORAL_TABLET | Freq: Four times a day (QID) | ORAL | Status: DC | PRN
  Administered 2024-05-27 – 2024-05-29 (×5): 500 mg via ORAL
  Filled 2024-05-27 (×5): qty 1

## 2024-05-27 MED ORDER — EPHEDRINE SULFATE-NACL 50-0.9 MG/10ML-% IV SOSY
PREFILLED_SYRINGE | INTRAVENOUS | Status: DC | PRN
  Administered 2024-05-27: 5 mg via INTRAVENOUS

## 2024-05-27 MED ORDER — HYDROCODONE-ACETAMINOPHEN 5-325 MG PO TABS
1.0000 | ORAL_TABLET | ORAL | Status: DC | PRN
  Filled 2024-05-27: qty 2

## 2024-05-27 MED ORDER — LACTATED RINGERS IV SOLN: INTRAVENOUS | Status: DC

## 2024-05-27 MED ORDER — FENTANYL CITRATE (PF) 100 MCG/2ML IJ SOLN
25.0000 ug | INTRAMUSCULAR | Status: DC | PRN
  Administered 2024-05-27 (×2): 50 ug via INTRAVENOUS

## 2024-05-27 MED ORDER — HYDRALAZINE HCL 20 MG/ML IJ SOLN: 10.0000 mg | INTRAMUSCULAR | Status: DC | PRN

## 2024-05-27 MED ORDER — TRIAMTERENE-HCTZ 37.5-25 MG PO CAPS
1.0000 | ORAL_CAPSULE | Freq: Every day | ORAL | Status: DC
  Filled 2024-05-27 (×3): qty 1

## 2024-05-27 MED ORDER — CHLORHEXIDINE GLUCONATE CLOTH 2 % EX PADS: 6.0000 | MEDICATED_PAD | Freq: Once | CUTANEOUS | Status: DC

## 2024-05-27 MED ORDER — KETAMINE HCL 10 MG/ML IJ SOLN
INTRAMUSCULAR | Status: DC | PRN
  Administered 2024-05-27: 20 mg via INTRAVENOUS
  Administered 2024-05-27: 10 mg via INTRAVENOUS

## 2024-05-27 MED ORDER — DROPERIDOL 2.5 MG/ML IJ SOLN: 0.6250 mg | Freq: Once | INTRAMUSCULAR | Status: DC | PRN

## 2024-05-27 MED ORDER — LETROZOLE 2.5 MG PO TABS
2.5000 mg | ORAL_TABLET | Freq: Every day | ORAL | Status: DC
  Administered 2024-05-27 – 2024-05-28 (×2): 2.5 mg via ORAL
  Filled 2024-05-27 (×3): qty 1

## 2024-05-27 MED ORDER — PHENYLEPHRINE 80 MCG/ML (10ML) SYRINGE FOR IV PUSH (FOR BLOOD PRESSURE SUPPORT)
PREFILLED_SYRINGE | INTRAVENOUS | Status: DC | PRN
  Administered 2024-05-27: 160 ug via INTRAVENOUS

## 2024-05-27 MED ORDER — ACETAMINOPHEN 500 MG PO TABS
1000.0000 mg | ORAL_TABLET | Freq: Four times a day (QID) | ORAL | Status: DC
  Administered 2024-05-27 – 2024-05-29 (×8): 1000 mg via ORAL
  Filled 2024-05-27 (×8): qty 2

## 2024-05-27 MED ORDER — ROCURONIUM BROMIDE 10 MG/ML (PF) SYRINGE
PREFILLED_SYRINGE | INTRAVENOUS | Status: DC | PRN
  Administered 2024-05-27: 10 mg via INTRAVENOUS
  Administered 2024-05-27: 20 mg via INTRAVENOUS
  Administered 2024-05-27: 50 mg via INTRAVENOUS

## 2024-05-27 MED ORDER — DEXAMETHASONE SOD PHOSPHATE PF 10 MG/ML IJ SOLN
INTRAMUSCULAR | Status: DC | PRN
  Administered 2024-05-27: 5 mg via INTRAVENOUS

## 2024-05-27 MED ORDER — PROPOFOL 10 MG/ML IV BOLUS
INTRAVENOUS | Status: AC
  Filled 2024-05-27: qty 20

## 2024-05-27 MED ORDER — ONDANSETRON HCL 4 MG/2ML IJ SOLN
INTRAMUSCULAR | Status: DC | PRN
  Administered 2024-05-27: 4 mg via INTRAVENOUS

## 2024-05-27 MED ORDER — SODIUM CHLORIDE (PF) 0.9 % IJ SOLN
INTRAMUSCULAR | Status: AC
  Filled 2024-05-27: qty 20

## 2024-05-27 MED ORDER — SODIUM CHLORIDE 0.9 % IV SOLN
INTRAVENOUS | Status: DC | PRN
  Administered 2024-05-27: 40 mL

## 2024-05-27 MED ORDER — PROPOFOL 10 MG/ML IV BOLUS
INTRAVENOUS | Status: DC | PRN
  Administered 2024-05-27: 150 mg via INTRAVENOUS

## 2024-05-27 MED ORDER — ENSURE PRE-SURGERY PO LIQD: 296.0000 mL | Freq: Once | ORAL | Status: DC

## 2024-05-27 MED ORDER — TRAMADOL HCL 50 MG PO TABS
50.0000 mg | ORAL_TABLET | Freq: Four times a day (QID) | ORAL | Status: DC | PRN
  Administered 2024-05-27 – 2024-05-29 (×7): 100 mg via ORAL
  Filled 2024-05-27 (×7): qty 2

## 2024-05-27 MED ORDER — DEXTROSE-SODIUM CHLORIDE 5-0.9 % IV SOLN: INTRAVENOUS | Status: AC

## 2024-05-27 MED ORDER — FAMOTIDINE 20 MG PO TABS
40.0000 mg | ORAL_TABLET | Freq: Every day | ORAL | Status: DC
  Administered 2024-05-27 – 2024-05-28 (×2): 40 mg via ORAL
  Filled 2024-05-27 (×2): qty 2

## 2024-05-27 MED ORDER — MONTELUKAST SODIUM 10 MG PO TABS
10.0000 mg | ORAL_TABLET | Freq: Every day | ORAL | Status: DC
  Administered 2024-05-27 – 2024-05-28 (×2): 10 mg via ORAL
  Filled 2024-05-27 (×2): qty 1

## 2024-05-27 MED ORDER — ACETAMINOPHEN 500 MG PO TABS: 1000.0000 mg | ORAL_TABLET | ORAL | Status: DC

## 2024-05-27 MED ORDER — CEFAZOLIN SODIUM-DEXTROSE 2-4 GM/100ML-% IV SOLN
2.0000 g | INTRAVENOUS | Status: AC
  Administered 2024-05-27: 2 g via INTRAVENOUS

## 2024-05-27 MED ORDER — FENTANYL CITRATE (PF) 250 MCG/5ML IJ SOLN
INTRAMUSCULAR | Status: DC | PRN
  Administered 2024-05-27: 100 ug via INTRAVENOUS
  Administered 2024-05-27 (×2): 25 ug via INTRAVENOUS

## 2024-05-27 MED ORDER — ORAL CARE MOUTH RINSE: 15.0000 mL | Freq: Once | OROMUCOSAL | Status: AC

## 2024-05-27 MED ORDER — OXYCODONE HCL 5 MG PO TABS: 5.0000 mg | ORAL_TABLET | Freq: Once | ORAL | Status: DC | PRN

## 2024-05-27 MED ORDER — VISTASEAL 10 ML SINGLE DOSE KIT
PACK | CUTANEOUS | Status: DC | PRN
  Administered 2024-05-27: 10 mL via TOPICAL

## 2024-05-27 MED ORDER — HYDROMORPHONE HCL 1 MG/ML IJ SOLN: 0.5000 mg | INTRAMUSCULAR | Status: DC | PRN

## 2024-05-27 NOTE — Op Note (Signed)
 05/27/2024  8:30 AM  10:36 AM  PATIENT:  Kayla Lowe   69 y.o. female  PRE-OPERATIVE DIAGNOSIS:  INCISIONAL HERNIA  POST-OPERATIVE DIAGNOSIS:  INCISIONAL HERNIA  PROCEDURE:  Procedures with comments: OPEN VENTRAL HERNIA REPAIR WITH MESH with TAR (N/A) - OPEN VENTRAL HERNIA REPAIR WITH MESH with TAR with bilateral muscle advancement flaps INSERTION OF MESH Bard soft 25x30cm (N/A)  SURGEON:  Surgeons and Role:    DEWAINE Rubin Calamity, MD - Primary  ASSISTANTS: Dr. Doroteo Curly   ANESTHESIA:   local and general  EBL:  25cc   BLOOD ADMINISTERED:none  DRAINS: none   LOCAL MEDICATIONS USED:  OTHER Exparel   SPECIMEN:  No Specimen  DISPOSITION OF SPECIMEN:  N/A  COUNTS:  YES  TOURNIQUET:  * No tourniquets in log *  DICTATION: .Dragon Dictation  Findings: Patient with a large incisional hernia.  There was minimal lyse of adhesions.  Patient had a bilateral transversalis muscle release with tissue advancement flaps.  Piece of Bard soft mesh, 25 x 30 cm was placed in the space.  ~Procedure: After the patient was consented he was taken back to the operating room placed supine position bilateral SCDs in place. After appropriate antibiotics were confirmed timeout was called and all facts verified.  The superficial scar was dissected off the anterior abdominal wall. This was discarded. The distal artery was used to maintain hemostasis and dissection was taken down to the anterior fascia midline. This was incised. The fascia was elevated up. The hernia sac was then bluntly penetrated. At this time to supplement incised the fascia inferiorly. There were minimal adhesions to the hernia sac in the midline. This allowed us  to extend the incision inferiorly to the extent of the hernia as well as superiorly.  At this time proceeded to lyse adhesions of the omentum to the anterior abdominal wall.  There was minimal omental adhesions to the midline.  Once this was done the rectus  sheath retracted medially. The inferior portion of the right tissues were then incised with electrocautery. The muscle was adherent to the posterior rectus sheath, However I was able to dissect off the posterior rectus sheath and its entirety. This was done to the extent of the hernia and inferiorly to create overlap of the mesh.. At this time a proximal 1 cm medial to the perforating vessels the transversus abdominis muscle was incised as was its fascia. We carried this dissection both inferiorly and superiorly. At this time the transversus abdominis muscle was then dissected laterally in a blunt fashion. A right angle was used to initially started the dissection with electrocautery. This easily dissected laterally.  This allowed to ease the muscle advancement flap to help medialize the peritoneum.   The peritoneal holes that were made were then reapproximated using a 2-0 Vicryl in a figure-of-eight fashion x1.   At this time proceeded to retract the left rectus sheath medially and the inferior portion of the rectus sheath was incised. There were minimal muscular adhesions in this area. This easily dissected away. Again approximately 1 cm medial to the perforating vessels to the transversus abdominis muscle was incised as was its fascia. Again we proceeded to dissect with a right angle both superiorly and inferiorly to release the transversus abdominis muscle. At this time proceeded to bluntly dissect away the transverse abdominis muscle from the underlying peritoneum laterally.  This allowed me to advance the muscle flap medially and reapproximate the peritoneum medially.  The superior and inferior portions of the dissection were connected  in this plane. This allowed us  to bluntly dissect the preperitoneal space superior and inferiorly. This was carried approximately to 6 cm superior inferiorly. At this time using Allis clamps and peritoneum was brought to the midline. This was easily approximated in the  midline. At this time 0 PDS suture was used in a running standard fashion reapproximate the peritoneum in the midline.  There was no tension on the midline peritoneal closure.  At this time a piece of 30 x 30 cm Bard soft mesh was then trimmed and placed in the preperitoneal space, and it measured 25 x 30 cm. The Novafil sutures were used to tack the mesh in a transfascial fashion using Endo Close device 2  laterally. This was also done superior and inferior portion of the hernia 2 respectively. The mesh lay flat and the tension was called laterally on each side. At this time as it was used to approximate the mesh to the peritoneum.  At this time the rectus fascia was reapproximated midline using 0 PDS in a standard running fashion. Again there was no tension on the fascia in the midline.  The skin was reapproximated using 3-0 Monocryl subcuticular fashion. The bilateral stab wounds were then dressed with Steri-Strips the midline skin was dressed with Dermabond.  Patient was awakened from general anesthesia and taken to recovery room stable condition.   PLAN OF CARE: Admit for overnight observation  PATIENT DISPOSITION:  PACU - hemodynamically stable.   Delay start of Pharmacological VTE agent (>24hrs) due to surgical blood loss or risk of bleeding: yes

## 2024-05-27 NOTE — Discharge Instructions (Signed)

## 2024-05-27 NOTE — Plan of Care (Signed)

## 2024-05-27 NOTE — H&P (Signed)
 " Chief Complaint: RE-CHECK       History of Present Illness: Kayla Lowe  is a 69 y.o. female who is seen today as an office consultation at the request of Dr. Arch for evaluation of RE-CHECK .   History of Present Illness Kayla Lowe  is a 69 year old female who presents for surgical consultation regarding a hernia.   She underwent a CT scan for the hernia.  CT shows a 11cm hernia.  She con' tot have pain and discomfort    She takes aspirin  daily. No history of myocardial infarction or anticoagulant use.           Review of Systems: A complete review of systems was obtained from the patient.  I have reviewed this information and discussed as appropriate with the patient.  See HPI as well for other ROS.   Review of Systems  Constitutional:  Negative for fever.  HENT:  Negative for congestion.   Eyes:  Negative for blurred vision.  Respiratory:  Negative for cough, shortness of breath and wheezing.   Cardiovascular:  Negative for chest pain and palpitations.  Gastrointestinal:  Negative for heartburn.  Genitourinary:  Negative for dysuria.  Musculoskeletal:  Negative for myalgias.  Skin:  Negative for rash.  Neurological:  Negative for dizziness and headaches.  Psychiatric/Behavioral:  Negative for depression and suicidal ideas.   All other systems reviewed and are negative.       Medical History: Past Medical History: DiagnosisDate            Arthritis                        Chronic kidney disease                      GERD (gastroesophageal reflux disease)                  Hypertension        There is no problem list on file for this patient.     Past Surgical History: ProcedureLateralityDate            EXPLORATORY LAPAROTOMY, WITH SMALL BOWEL RESECTION                 02/04/2023             Dr. CHARM Poli            LT BR RSL(BCG) LUMP x2 & LT SLNB                    07/03/2023             Dr Poli            HYSTERECTOMY                        Allergies AllergenReactions Losartan  PotassiumOther (See Comments)                         Caused cough and sweats. Tingling in legs. Statins-Hmg-Coa Reductase InhibitorsOther (See Comments)                         myalgias LisinoprilCough     Current Outpatient Medications on File Prior to Visit MedicationSigDispenseRefill            acetaminophen  (TYLENOL ) 500 MG tablet   Take by mouth  anastrozole  (ARIMIDEX ) 1 mg tablet Take 1 mg by mouth once daily                                 aspirin  81 MG EC tablet         Take 81 mg by mouth once daily                               carvediloL  (COREG ) 6.25 MG tablet  Take 6.25 mg by mouth 2 (two) times daily with meals                              cetirizine  (ZYRTEC ) 10 MG tablet      Take 10 mg by mouth once daily                               cholecalciferol  (VITAMIN D3) 2,000 unit capsule      Take 1 capsule by mouth once daily                               ezetimibe  (ZETIA ) 10 mg tablet          Take 1 tablet by mouth once daily                              famotidine  (PEPCID ) 40 MG tablet    Take 40 mg by mouth at bedtime                               ferrous sulfate  325 (65 FE) MG tablet            Take 325 mg by mouth daily with breakfast                                hydrALAZINE  (APRESOLINE ) 25 MG tablet Take 25 mg by mouth 3 (three) times daily                              montelukast  (SINGULAIR ) 10 mg tablet        Take 10 mg by mouth at bedtime                               pantoprazole  (PROTONIX ) 20 MG DR tablet            Take 20 mg by mouth once daily                            polyethylene glycol (MIRALAX ) packet          Take 17 g by mouth once daily as needed                                   pravastatin  (PRAVACHOL ) 40 MG tablet      Take 40 mg by  mouth once daily                               tiZANidine  (ZANAFLEX ) 2 MG tablet Take 2 mg by mouth                            traMADoL  (ULTRAM ) 50 mg tablet    Take by mouth                                    triamterene -hydroCHLOROthiazide  (DYAZIDE ) 37.5-25 mg capsule          Take 1 capsule by mouth                               ACCRUFER  30 mg Cap         TAKE ONE CAPSULE BY MOUTH IN THE MORNING AND AT BEDTIME                                  amLODIPine  (NORVASC ) 10 MG tablet        Take 10 mg by mouth once daily                               cyclobenzaprine  (FLEXERIL ) 10 MG tablet   Take 10 mg by mouth 3 (three) times daily as needed                                    ondansetron  (ZOFRAN ) 4 MG tablet  Take 4 mg by mouth every 6 (six) hours as needed                                   potassium chloride  (KLOR-CON  M20) 20 MEQ ER tablet    Take 40 mg by mouth once daily                      No current facility-administered medications on file prior to visit.     Family History ProblemRelationAge of Onset            High blood pressure (Hypertension)   Mother             Stroke  Father              High blood pressure (Hypertension)   Father              Coronary Artery Disease (Blocked arteries around heart)     Brother                        Diabetes          Brother                        High blood pressure (Hypertension)   Brother                        Diabetes  Paternal Uncle                        Diabetes          Paternal Grandfather       Social History   Tobacco Use Smoking StatusFormer Types:Cigarettes Smokeless Museum/gallery Curator     Social History   Socioeconomic History Marital status:Legally Separated Tobacco Use Smoking status:Former                         Types: Cigarettes Smokeless tobacco:Never Vaping Use Vaping status:Never Used Substance and Sexual Activity Alcohol use:Not Currently Drug ldz:Wzczm   Social Drivers of Health   Financial Resource Strain: Low Risk  (09/12/2023)             Received from [facility]             Overall Financial Resource Strain (CARDIA)                         Difficulty of Paying Living Expenses: Not very hard Recent Concern: Financial Resource Strain - Medium Risk (08/07/2023)             Received from [facility]             Overall Financial Resource Strain (CARDIA)                        Difficulty of Paying Living Expenses: Somewhat hard Food Insecurity: No Food Insecurity (09/12/2023)             Received from [facility]             Hunger Vital Sign                        Within the past 12 months, you worried that your food would run out before you got the money to buy more.: Never true                        Within the past 12 months, the food you bought just didn't last and you didn't have money to get more.: Never true Recent Concern: Food Insecurity - Food Insecurity Present (06/14/2023)             Received from [facility]             Hunger Vital Sign                        Worried About Running Out of Food in the Last Year: Sometimes true                        Ran Out of Food in the Last Year: Sometimes true Transportation Needs: No Transportation Needs (09/12/2023)             Received from [facility] - [facility]                        Lack of Transportation (Medical): No                        Lack of Transportation (Non-Medical): No Physical Activity: Insufficiently Active (09/12/2023)  Received from [facility]             Exercise Vital Sign                        On average, how many days per week do you engage in moderate to strenuous exercise (like a brisk walk)?: 3 days                        On average, how many minutes do you engage in exercise at this level?: 20 min Stress: Stress Concern Present (09/12/2023)             Received from [facility] - Occupational Stress Questionnaire                        Feeling of Stress : To some extent Social Connections: Moderately Integrated (09/12/2023)             Received  from [facility]             Social Connection and Isolation Panel                        In a typical week, how many times do you talk on the phone with family, friends, or neighbors?: More than three times a week                        How often do you get together with friends or relatives?: Once a week                        How often do you attend church or religious services?: More than 4 times per year                        Do you belong to any clubs or organizations such as church groups, unions, fraternal or athletic groups, or school groups?: Yes                        How often do you attend meetings of the clubs or organizations you belong to?: More than 4 times per year                        Are you married, widowed, divorced, separated, never married, or living with a partner?: Separated Housing Stability: High Risk (08/07/2023)             Received from [facility]             Housing Stability Vital Sign                        Unable to Pay for Housing in the Last Year: Yes                        Homeless in the Last Year: No     Objective:   BP (!) 145/71   Pulse 82   Temp 98.9 F (37.2 C) (Oral)   Resp 18   Ht 5' 3.5 (1.613 m)   Wt 90.7 kg   SpO2 98%  BMI 34.87 kg/m   Physical Exam Constitutional:      Appearance: Normal appearance.  HENT:     Head: Normocephalic and atraumatic.     Mouth/Throat:     Mouth: Mucous membranes are moist.     Pharynx: Oropharynx is clear.  Eyes:     General: No scleral icterus.    Pupils: Pupils are equal, round, and reactive to light.  Cardiovascular:     Rate and Rhythm: Normal rate and regular rhythm.     Pulses: Normal pulses.     Heart sounds: No murmur heard.    No friction rub. No gallop.  Pulmonary:     Effort: Pulmonary effort is normal. No respiratory distress.     Breath sounds: Normal breath sounds. No stridor.  Abdominal:     General: Abdomen is flat.     Hernia: A hernia is present.    Musculoskeletal:         General: No swelling.  Skin:    General: Skin is warm.  Neurological:     General: No focal deficit present.     Mental Status: She is alert and oriented to person, place, and time. Mental status is at baseline.  Psychiatric:        Mood and Affect: Mood normal.        Thought Content: Thought content normal.        Judgment: Judgment normal.       Hernia Size:11cm Incarcerated: no Initial Hernia     Assessment and Plan: Diagnoses and all orders for this visit:   Incisional hernia without obstruction or gangrene     Kayla Lowe  is a 69 y.o. female    1.          We will proceed to the OR for an OPEN VENTRAL hernia repair with mesh, likely TAR 2.         All risks and benefits were discussed with the patient, to generally include infection, bleeding, damage to surrounding structures, acute and chronic nerve pain, and recurrence. Alternatives were offered and described.  All questions were answered and the patient voiced understanding of the procedure and wishes to proceed at this point.             No follow-ups on file.   Lynda Leos, MD, [facility] General & Minimally Invasive Surgery "

## 2024-05-27 NOTE — Anesthesia Procedure Notes (Signed)
 Procedure Name: Intubation Date/Time: 05/27/2024 8:32 AM  Performed by: Virgil Ee, CRNAPre-anesthesia Checklist: Patient identified, Patient being monitored, Timeout performed, Emergency Drugs available and Suction available Patient Re-evaluated:Patient Re-evaluated prior to induction Oxygen Delivery Method: Circle system utilized Preoxygenation: Pre-oxygenation with 100% oxygen Induction Type: IV induction Ventilation: Mask ventilation without difficulty Laryngoscope Size: Mac and 4 Grade View: Grade I Tube type: Oral Tube size: 7.0 mm Number of attempts: 1 Airway Equipment and Method: Stylet Placement Confirmation: ETT inserted through vocal cords under direct vision, positive ETCO2 and breath sounds checked- equal and bilateral Secured at: 21 cm Tube secured with: Tape Dental Injury: Teeth and Oropharynx as per pre-operative assessment

## 2024-05-27 NOTE — Transfer of Care (Signed)
 Immediate Anesthesia Transfer of Care Note  Patient: Kayla Lowe   Procedure(s) Performed: OPEN VENTRAL HERNIA REPAIR WITH MESH with TAR (Abdomen) INSERTION OF MESH (Abdomen)  Patient Location: PACU  Anesthesia Type:General  Level of Consciousness: drowsy and responds to stimulation  Airway & Oxygen Therapy: Patient Spontanous Breathing  Post-op Assessment: Report given to RN and Post -op Vital signs reviewed and stable  Post vital signs: Reviewed and stable  Last Vitals:  Vitals Value Taken Time  BP 135/70 05/27/24 11:01  Temp 37.1 C 05/27/24 11:01  Pulse 80 05/27/24 11:04  Resp 21 05/27/24 11:04  SpO2 96 % 05/27/24 11:04  Vitals shown include unfiled device data.  Last Pain:  Vitals:   05/27/24 0741  TempSrc:   PainSc: 0-No pain         Complications: No notable events documented.

## 2024-05-28 ENCOUNTER — Encounter (HOSPITAL_COMMUNITY): Payer: Self-pay | Admitting: General Surgery

## 2024-05-28 DIAGNOSIS — K432 Incisional hernia without obstruction or gangrene: Secondary | ICD-10-CM | POA: Diagnosis not present

## 2024-05-28 LAB — CBC
HCT: 35.3 % — ABNORMAL LOW (ref 36.0–46.0)
Hemoglobin: 11.5 g/dL — ABNORMAL LOW (ref 12.0–15.0)
MCH: 32.4 pg (ref 26.0–34.0)
MCHC: 32.6 g/dL (ref 30.0–36.0)
MCV: 99.4 fL (ref 80.0–100.0)
Platelets: 269 K/uL (ref 150–400)
RBC: 3.55 MIL/uL — ABNORMAL LOW (ref 3.87–5.11)
RDW: 13.2 % (ref 11.5–15.5)
WBC: 9.3 K/uL (ref 4.0–10.5)
nRBC: 0 % (ref 0.0–0.2)

## 2024-05-28 LAB — BASIC METABOLIC PANEL WITH GFR
Anion gap: 12 (ref 5–15)
BUN: 13 mg/dL (ref 8–23)
CO2: 22 mmol/L (ref 22–32)
Calcium: 10.2 mg/dL (ref 8.9–10.3)
Chloride: 101 mmol/L (ref 98–111)
Creatinine, Ser: 1.14 mg/dL — ABNORMAL HIGH (ref 0.44–1.00)
GFR, Estimated: 52 mL/min — ABNORMAL LOW
Glucose, Bld: 132 mg/dL — ABNORMAL HIGH (ref 70–99)
Potassium: 4 mmol/L (ref 3.5–5.1)
Sodium: 135 mmol/L (ref 135–145)

## 2024-05-28 MED ORDER — TRIAMTERENE-HCTZ 37.5-25 MG PO TABS
1.0000 | ORAL_TABLET | Freq: Every day | ORAL | Status: DC
  Administered 2024-05-28 – 2024-05-29 (×2): 1 via ORAL
  Filled 2024-05-28 (×2): qty 1

## 2024-05-28 NOTE — Plan of Care (Signed)
" °  Problem: Education: Goal: Knowledge of General Education information will improve Description: Including pain rating scale, medication(s)/side effects and non-pharmacologic comfort measures Outcome: Completed/Met   Problem: Health Behavior/Discharge Planning: Goal: Ability to manage health-related needs will improve Outcome: Completed/Met   Problem: Clinical Measurements: Goal: Ability to maintain clinical measurements within normal limits will improve Outcome: Completed/Met Goal: Will remain free from infection Outcome: Completed/Met Goal: Diagnostic test results will improve Outcome: Completed/Met Goal: Respiratory complications will improve Outcome: Completed/Met Goal: Cardiovascular complication will be avoided Outcome: Completed/Met   Problem: Activity: Goal: Risk for activity intolerance will decrease Outcome: Completed/Met   Problem: Nutrition: Goal: Adequate nutrition will be maintained Outcome: Completed/Met   Problem: Coping: Goal: Level of anxiety will decrease Outcome: Completed/Met   Problem: Elimination: Goal: Will not experience complications related to bowel motility Outcome: Completed/Met Goal: Will not experience complications related to urinary retention Outcome: Completed/Met   Problem: Pain Managment: Goal: General experience of comfort will improve and/or be controlled Outcome: Completed/Met   Problem: Safety: Goal: Ability to remain free from injury will improve Outcome: Completed/Met   Problem: Skin Integrity: Goal: Risk for impaired skin integrity will decrease Outcome: Completed/Met   Problem: Education: Goal: Knowledge of the prescribed therapeutic regimen will improve Outcome: Completed/Met   Problem: Bowel/Gastric: Goal: Gastrointestinal status for postoperative course will improve Outcome: Completed/Met   Problem: Cardiac: Goal: Ability to maintain an adequate cardiac output Outcome: Completed/Met Goal: Will show no  evidence of cardiac arrhythmias Outcome: Completed/Met   Problem: Nutritional: Goal: Will attain and maintain optimal nutritional status Outcome: Completed/Met   Problem: Neurological: Goal: Will regain or maintain usual level of consciousness Outcome: Completed/Met   Problem: Clinical Measurements: Goal: Ability to maintain clinical measurements within normal limits Outcome: Completed/Met Goal: Postoperative complications will be avoided or minimized Outcome: Completed/Met   Problem: Respiratory: Goal: Will regain and/or maintain adequate ventilation Outcome: Completed/Met Goal: Respiratory status will improve Outcome: Completed/Met   Problem: Skin Integrity: Goal: Demonstrates signs of wound healing without infection Outcome: Completed/Met   Problem: Urinary Elimination: Goal: Will remain free from infection Outcome: Completed/Met Goal: Ability to achieve and maintain adequate urine output Outcome: Completed/Met   "

## 2024-05-28 NOTE — Progress Notes (Signed)
 1 Day Post-Op   Subjective/Chief Complaint: PT doing well this AM Having some abd soreness as expected Tol PO   Objective: Vital signs in last 24 hours: Temp:  [98 F (36.7 C)-99 F (37.2 C)] 98.7 F (37.1 C) (01/15 0359) Pulse Rate:  [69-94] 85 (01/15 0359) Resp:  [11-25] 18 (01/15 0359) BP: (111-146)/(61-94) 125/61 (01/15 0359) SpO2:  [92 %-99 %] 95 % (01/15 0359) Weight:  [90.7 kg] 90.7 kg (01/14 0649)    Intake/Output from previous day: 01/14 0701 - 01/15 0700 In: 1980 [P.O.:1080; I.V.:800; IV Piggyback:100] Out: 50 [Blood:50] Intake/Output this shift: Total I/O In: 1080 [P.O.:1080] Out: -   General appearance: comfrotable in bed  Abd: inc c/d/i  Lab Results:  No results for input(s): WBC, HGB, HCT, PLT in the last 72 hours. BMET No results for input(s): NA, K, CL, CO2, GLUCOSE, BUN, CREATININE, CALCIUM  in the last 72 hours. PT/INR No results for input(s): LABPROT, INR in the last 72 hours. ABG No results for input(s): PHART, HCO3 in the last 72 hours.  Invalid input(s): PCO2, PO2  Studies/Results: No results found.  Anti-infectives: Anti-infectives (From admission, onward)    Start     Dose/Rate Route Frequency Ordered Stop   05/27/24 0700  ceFAZolin  (ANCEF ) IVPB 2g/100 mL premix        2 g 200 mL/hr over 30 Minutes Intravenous On call to O.R. 05/27/24 [id number] 05/27/24 0904       Assessment/Plan: s/p Procedures with comments: OPEN VENTRAL HERNIA REPAIR WITH MESH with TAR (N/A) - OPEN VENTRAL HERNIA REPAIR WITH MESH with TAR INSERTION OF MESH (N/A) POD1 Adv diet astol Pain control Mobilize Home later today or tomorrow  LOS: 0 days    Lynda Leos 05/28/2024

## 2024-05-28 NOTE — Anesthesia Postprocedure Evaluation (Signed)
"   Anesthesia Post Note  Patient: Kayla Lowe   Procedure(s) Performed: OPEN VENTRAL HERNIA REPAIR WITH MESH with TAR (Abdomen) INSERTION OF MESH (Abdomen)     Patient location during evaluation: PACU Anesthesia Type: General Level of consciousness: awake and alert Pain management: pain level controlled Vital Signs Assessment: post-procedure vital signs reviewed and stable Respiratory status: spontaneous breathing, nonlabored ventilation, respiratory function stable and patient connected to nasal cannula oxygen Cardiovascular status: blood pressure returned to baseline and stable Postop Assessment: no apparent nausea or vomiting Anesthetic complications: no   No notable events documented.  Last Vitals:  Vitals:   05/28/24 0359 05/28/24 0826  BP: 125/61 129/65  Pulse: 85 86  Resp: 18 16  Temp: 37.1 C 37.1 C  SpO2: 95% 93%    Last Pain:  Vitals:   05/28/24 0826  TempSrc: Oral  PainSc:                  [name]      "

## 2024-05-28 NOTE — Care Management Obs Status (Signed)
 MEDICARE OBSERVATION STATUS NOTIFICATION   Patient Details  Name: Kayla Lowe  MRN: [id number] Date of Birth: 1955/06/06   Medicare Observation Status Notification Given:  Yes    Jennie Laneta Dragon 05/28/2024, 9:48 AM

## 2024-05-29 ENCOUNTER — Other Ambulatory Visit (HOSPITAL_COMMUNITY): Payer: Self-pay

## 2024-05-29 DIAGNOSIS — K432 Incisional hernia without obstruction or gangrene: Secondary | ICD-10-CM | POA: Diagnosis not present

## 2024-05-29 MED ORDER — ACETAMINOPHEN 500 MG PO TABS: 1000.0000 mg | ORAL_TABLET | Freq: Three times a day (TID) | ORAL | Status: AC | PRN

## 2024-05-29 MED ORDER — METHOCARBAMOL 500 MG PO TABS
500.0000 mg | ORAL_TABLET | Freq: Four times a day (QID) | ORAL | 0 refills | Status: AC | PRN
  Filled 2024-05-29: qty 45, 12d supply, fill #0

## 2024-05-29 NOTE — Progress Notes (Signed)
 After Visit Summary was printed and given to the patient. Patient prescriptions are sent to the patients pharmacy. D/c education completed with patient/family including follow up instructions, medication list, d/c activities limitations if indicated, with other d/c instructions as indicated by MD - patient able to verbalize understanding, all questions fully answered. Patient instructed to return to ED, call 911, or call MD for any changes in condition.

## 2024-05-29 NOTE — Progress Notes (Addendum)
 "   2 Days Post-Op  Subjective: CC: Family at bedside.  Patient with some R sided pain with movement. Pain is well controlled with current pain medications. Tolerating diet without n/v. Passing flatus. No BM. Voiding without issues. No cp or sob. Denies LE edema. Mobilizing in the halls.  Objective: Vital signs in last 24 hours: Temp:  [98 F (36.7 C)-100.5 F (38.1 C)] 98 F (36.7 C) (01/16 0739) Pulse Rate:  [85-93] 85 (01/16 0739) Resp:  [18-20] 20 (01/16 0739) BP: (100-138)/(51-77) 100/51 (01/16 0739) SpO2:  [93 %-97 %] 97 % (01/16 0739)    Intake/Output from previous day: 01/15 0701 - 01/16 0700 In: 240 [P.O.:240] Out: -  Intake/Output this shift: Total I/O In: 240 [P.O.:240] Out: -   PE: Gen:  Alert, NAD, pleasant Card:  Reg Pulm:  Rate and effort normal Abd: Soft, ND, appears appropriately ttp over the midline incision and some ttp over the right abdomen without rigidity or guarding. No obvious abdominal wall hematoma. Midline wound cdi without drainage.  Ext:  No LE edema or calf tenderness Psych: A&Ox3   Lab Results:  Recent Labs    05/28/24 0738  WBC 9.3  HGB 11.5*  HCT 35.3*  PLT 269   BMET Recent Labs    05/28/24 0738  NA 135  K 4.0  CL 101  CO2 22  GLUCOSE 132*  BUN 13  CREATININE 1.14*  CALCIUM  10.2   PT/INR No results for input(s): LABPROT, INR in the last 72 hours. CMP     Component Value Date/Time   NA 135 05/28/2024 0738   NA 145 (H) 07/29/2018 1112   K 4.0 05/28/2024 0738   CL 101 05/28/2024 0738   CO2 22 05/28/2024 0738   GLUCOSE 132 (H) 05/28/2024 0738   BUN 13 05/28/2024 0738   BUN 8 07/29/2018 1112   CREATININE 1.14 (H) 05/28/2024 0738   CREATININE 1.06 (H) 02/28/2024 1016   CREATININE 0.87 08/03/2015 0942   CALCIUM  10.2 05/28/2024 0738   PROT 7.9 02/28/2024 1016   PROT 7.0 07/29/2018 1112   ALBUMIN  4.2 02/28/2024 1016   ALBUMIN  4.2 07/29/2018 1112   AST 13 (L) 02/28/2024 1016   ALT 7 02/28/2024 1016    ALKPHOS 88 02/28/2024 1016   BILITOT 0.4 02/28/2024 1016   GFRNONAA 52 (L) 05/28/2024 0738   GFRNONAA 57 (L) 02/28/2024 1016   GFRNONAA 67 06/18/2014 1714   GFRAA 75 07/29/2018 1112   GFRAA 77 06/18/2014 1714   Lipase     Component Value Date/Time   LIPASE 29 02/03/2023 1516    Studies/Results: No results found.  Anti-infectives: Anti-infectives (From admission, onward)    Start     Dose/Rate Route Frequency Ordered Stop   05/27/24 0700  ceFAZolin  (ANCEF ) IVPB 2g/100 mL premix        2 g 200 mL/hr over 30 Minutes Intravenous On call to O.R. 05/27/24 0658 05/27/24 0904        Assessment/Plan POD 2 s/p open ventral hernia repair with mesh with TAR by Dr. Rubin on 1/14 - Patient tolerating regular diet without n/v and is passing flatus - Pain well controlled with PO medications. She declines need for Tramadol  at d/c and reports she has some at home.  - Low grade fever overnight to 100.5. Now resolved. On RA. Abd soft. Incision cdi. No LE edema. No tachycardia or systolic hypotension.  - Labs 8/84 reviewed. WBC wnl. Hgb 11.5 from 12.6. Cr stable. Na and K wnl.  -  Will review case with attending to ensure patient is okay for d/c. Discussed discharge instructions, restrictions and return/call back precautions with patient. Will ensure follow up is arranged.     LOS: 0 days    Kayla Lowe, [facility] 05/29/2024, 11:13 AM Please see Amion for pager number during day hours 7:00am-4:30pm  "

## 2024-05-29 NOTE — Hospital Course (Signed)
 Pt was admitted post op.  Please see op note for full details. Pt did well with pain.  She was tol PO well She was amb well on her own.  She was deemed stable and Dc'd home on POD 2

## 2024-06-04 ENCOUNTER — Other Ambulatory Visit: Payer: Self-pay | Admitting: Internal Medicine

## 2024-06-04 DIAGNOSIS — K219 Gastro-esophageal reflux disease without esophagitis: Secondary | ICD-10-CM

## 2024-08-03 ENCOUNTER — Ambulatory Visit: Attending: Surgery

## 2024-08-07 ENCOUNTER — Ambulatory Visit
